# Patient Record
Sex: Male | Born: 1950 | Race: White | Hispanic: No | Marital: Married | Smoking: Current every day smoker
Health system: Southern US, Community
[De-identification: ages and names within clinical notes are randomized; demographics above are authoritative.]

## PROBLEM LIST (undated history)

## (undated) DIAGNOSIS — N400 Enlarged prostate without lower urinary tract symptoms: Secondary | ICD-10-CM

## (undated) DIAGNOSIS — G4733 Obstructive sleep apnea (adult) (pediatric): Secondary | ICD-10-CM

## (undated) DIAGNOSIS — I700 Atherosclerosis of aorta: Secondary | ICD-10-CM

## (undated) DIAGNOSIS — E785 Hyperlipidemia, unspecified: Secondary | ICD-10-CM

## (undated) DIAGNOSIS — K219 Gastro-esophageal reflux disease without esophagitis: Secondary | ICD-10-CM

## (undated) DIAGNOSIS — N281 Cyst of kidney, acquired: Secondary | ICD-10-CM

## (undated) DIAGNOSIS — I2510 Atherosclerotic heart disease of native coronary artery without angina pectoris: Secondary | ICD-10-CM

## (undated) DIAGNOSIS — R7303 Prediabetes: Secondary | ICD-10-CM

## (undated) DIAGNOSIS — K760 Fatty (change of) liver, not elsewhere classified: Secondary | ICD-10-CM

## (undated) DIAGNOSIS — I6529 Occlusion and stenosis of unspecified carotid artery: Secondary | ICD-10-CM

## (undated) DIAGNOSIS — N529 Male erectile dysfunction, unspecified: Secondary | ICD-10-CM

## (undated) DIAGNOSIS — C801 Malignant (primary) neoplasm, unspecified: Secondary | ICD-10-CM

## (undated) DIAGNOSIS — R001 Bradycardia, unspecified: Secondary | ICD-10-CM

## (undated) DIAGNOSIS — R7989 Other specified abnormal findings of blood chemistry: Secondary | ICD-10-CM

## (undated) DIAGNOSIS — I10 Essential (primary) hypertension: Secondary | ICD-10-CM

## (undated) DIAGNOSIS — C4490 Unspecified malignant neoplasm of skin, unspecified: Secondary | ICD-10-CM

## (undated) DIAGNOSIS — Z7902 Long term (current) use of antithrombotics/antiplatelets: Secondary | ICD-10-CM

## (undated) DIAGNOSIS — D696 Thrombocytopenia, unspecified: Secondary | ICD-10-CM

## (undated) DIAGNOSIS — K148 Other diseases of tongue: Secondary | ICD-10-CM

## (undated) DIAGNOSIS — D751 Secondary polycythemia: Secondary | ICD-10-CM

## (undated) DIAGNOSIS — Z9989 Dependence on other enabling machines and devices: Secondary | ICD-10-CM

## (undated) HISTORY — DX: Malignant (primary) neoplasm, unspecified: C80.1

## (undated) HISTORY — DX: Gastro-esophageal reflux disease without esophagitis: K21.9

## (undated) HISTORY — DX: Male erectile dysfunction, unspecified: N52.9

## (undated) HISTORY — DX: Hyperlipidemia, unspecified: E78.5

## (undated) HISTORY — DX: Atherosclerotic heart disease of native coronary artery without angina pectoris: I25.10

## (undated) HISTORY — DX: Dependence on other enabling machines and devices: Z99.89

## (undated) HISTORY — DX: Thrombocytopenia, unspecified: D69.6

## (undated) HISTORY — PX: ESOPHAGOGASTRODUODENOSCOPY: SHX1529

## (undated) HISTORY — DX: Bradycardia, unspecified: R00.1

## (undated) HISTORY — DX: Essential (primary) hypertension: I10

## (undated) HISTORY — PX: PENILE PROSTHESIS IMPLANT: SHX240

## (undated) HISTORY — DX: Other diseases of tongue: K14.8

## (undated) HISTORY — DX: Obstructive sleep apnea (adult) (pediatric): G47.33

---

## 1987-11-13 HISTORY — PX: KNEE DEBRIDEMENT: SHX1894

## 1989-11-12 HISTORY — PX: KNEE ARTHROSCOPY: SUR90

## 1998-11-12 DIAGNOSIS — Z951 Presence of aortocoronary bypass graft: Secondary | ICD-10-CM

## 1998-11-12 HISTORY — PX: CORONARY ARTERY BYPASS GRAFT: SHX141

## 1998-11-12 HISTORY — DX: Presence of aortocoronary bypass graft: Z95.1

## 1999-10-23 ENCOUNTER — Ambulatory Visit (HOSPITAL_COMMUNITY): Admission: RE | Admit: 1999-10-23 | Discharge: 1999-10-23 | Payer: Self-pay | Admitting: *Deleted

## 1999-10-27 ENCOUNTER — Encounter: Payer: Self-pay | Admitting: Thoracic Surgery (Cardiothoracic Vascular Surgery)

## 1999-10-31 ENCOUNTER — Inpatient Hospital Stay (HOSPITAL_COMMUNITY)
Admission: RE | Admit: 1999-10-31 | Discharge: 1999-11-04 | Payer: Self-pay | Admitting: Thoracic Surgery (Cardiothoracic Vascular Surgery)

## 1999-10-31 ENCOUNTER — Encounter: Payer: Self-pay | Admitting: Thoracic Surgery (Cardiothoracic Vascular Surgery)

## 1999-11-01 ENCOUNTER — Encounter: Payer: Self-pay | Admitting: Thoracic Surgery (Cardiothoracic Vascular Surgery)

## 1999-11-02 ENCOUNTER — Encounter: Payer: Self-pay | Admitting: Thoracic Surgery (Cardiothoracic Vascular Surgery)

## 1999-11-03 ENCOUNTER — Encounter: Payer: Self-pay | Admitting: Thoracic Surgery (Cardiothoracic Vascular Surgery)

## 2001-06-14 ENCOUNTER — Emergency Department (HOSPITAL_COMMUNITY): Admission: EM | Admit: 2001-06-14 | Discharge: 2001-06-14 | Payer: Self-pay | Admitting: *Deleted

## 2005-12-25 ENCOUNTER — Ambulatory Visit: Payer: Self-pay | Admitting: Cardiology

## 2005-12-26 ENCOUNTER — Inpatient Hospital Stay (HOSPITAL_COMMUNITY): Admission: EM | Admit: 2005-12-26 | Discharge: 2005-12-28 | Payer: Self-pay | Admitting: Emergency Medicine

## 2007-10-22 ENCOUNTER — Emergency Department: Payer: Self-pay | Admitting: Emergency Medicine

## 2007-10-22 ENCOUNTER — Other Ambulatory Visit: Payer: Self-pay

## 2008-09-17 ENCOUNTER — Ambulatory Visit: Payer: Self-pay | Admitting: Endocrinology

## 2008-11-12 HISTORY — PX: CATARACT EXTRACTION, BILATERAL: SHX1313

## 2008-11-12 HISTORY — PX: SHOULDER SURGERY: SHX246

## 2008-12-10 ENCOUNTER — Ambulatory Visit: Payer: Self-pay | Admitting: Unknown Physician Specialty

## 2008-12-17 ENCOUNTER — Ambulatory Visit: Payer: Self-pay | Admitting: Unknown Physician Specialty

## 2009-11-12 HISTORY — PX: SHOULDER ARTHROSCOPY: SHX128

## 2010-12-12 HISTORY — PX: US ECHOCARDIOGRAPHY: HXRAD669

## 2011-03-02 ENCOUNTER — Other Ambulatory Visit: Payer: Self-pay | Admitting: Cardiovascular Disease

## 2013-04-26 ENCOUNTER — Other Ambulatory Visit: Payer: Self-pay | Admitting: Cardiovascular Disease

## 2013-05-03 ENCOUNTER — Other Ambulatory Visit: Payer: Self-pay | Admitting: Cardiovascular Disease

## 2013-05-05 NOTE — Telephone Encounter (Signed)
Rx was sent to pharmacy electronically. 

## 2013-07-08 ENCOUNTER — Other Ambulatory Visit: Payer: Self-pay | Admitting: Cardiovascular Disease

## 2013-07-10 NOTE — Telephone Encounter (Signed)
Dr. Tresa Endo please address this prescription.

## 2013-08-03 ENCOUNTER — Other Ambulatory Visit: Payer: Self-pay | Admitting: Cardiovascular Disease

## 2013-08-03 NOTE — Telephone Encounter (Signed)
Rx was sent to pharmacy electronically. 

## 2013-09-09 ENCOUNTER — Telehealth: Payer: Self-pay | Admitting: Cardiovascular Disease

## 2013-09-09 MED ORDER — VARENICLINE TARTRATE 1 MG PO TABS: 1.0000 mg | ORAL_TABLET | Freq: Two times a day (BID) | ORAL | Status: DC

## 2013-09-09 NOTE — Telephone Encounter (Signed)
Called patient to inform Chantix had been sent to pharmacy

## 2013-09-09 NOTE — Telephone Encounter (Signed)
Its been over a year-need a new prescription for his Chantix.Please send or call to [facility].

## 2013-11-30 ENCOUNTER — Other Ambulatory Visit: Payer: Self-pay | Admitting: Cardiovascular Disease

## 2013-11-30 NOTE — Telephone Encounter (Signed)
Rx was sent to pharmacy electronically. 

## 2013-12-25 ENCOUNTER — Other Ambulatory Visit: Payer: Self-pay | Admitting: Cardiovascular Disease

## 2014-01-05 ENCOUNTER — Ambulatory Visit: Payer: Self-pay | Admitting: Adult Health

## 2014-01-06 ENCOUNTER — Ambulatory Visit: Payer: Self-pay | Admitting: Adult Health

## 2014-01-06 ENCOUNTER — Ambulatory Visit (INDEPENDENT_AMBULATORY_CARE_PROVIDER_SITE_OTHER): Admitting: Adult Health

## 2014-01-06 ENCOUNTER — Encounter (INDEPENDENT_AMBULATORY_CARE_PROVIDER_SITE_OTHER): Payer: Self-pay

## 2014-01-06 ENCOUNTER — Encounter: Payer: Self-pay | Admitting: Adult Health

## 2014-01-06 VITALS — BP 150/72 | HR 57 | Temp 98.1°F | Resp 14 | Ht 71.25 in | Wt 229.0 lb

## 2014-01-06 DIAGNOSIS — Z0000 Encounter for general adult medical examination without abnormal findings: Secondary | ICD-10-CM

## 2014-01-06 DIAGNOSIS — E785 Hyperlipidemia, unspecified: Secondary | ICD-10-CM

## 2014-01-06 DIAGNOSIS — Z1211 Encounter for screening for malignant neoplasm of colon: Secondary | ICD-10-CM

## 2014-01-06 DIAGNOSIS — F17200 Nicotine dependence, unspecified, uncomplicated: Secondary | ICD-10-CM

## 2014-01-06 DIAGNOSIS — I10 Essential (primary) hypertension: Secondary | ICD-10-CM | POA: Insufficient documentation

## 2014-01-06 DIAGNOSIS — Z125 Encounter for screening for malignant neoplasm of prostate: Secondary | ICD-10-CM

## 2014-01-06 DIAGNOSIS — N529 Male erectile dysfunction, unspecified: Secondary | ICD-10-CM

## 2014-01-06 DIAGNOSIS — Z7189 Other specified counseling: Secondary | ICD-10-CM

## 2014-01-06 DIAGNOSIS — Z716 Tobacco abuse counseling: Secondary | ICD-10-CM

## 2014-01-06 LAB — LIPID PANEL
Cholesterol: 189 mg/dL (ref 0–200)
HDL: 53.3 mg/dL (ref >39.00)
Total CHOL/HDL Ratio: 4
Triglycerides: 251 mg/dL — ABNORMAL HIGH (ref 0.0–149.0)
VLDL: 50.2 mg/dL — ABNORMAL HIGH (ref 0.0–40.0)

## 2014-01-06 LAB — CBC WITH DIFFERENTIAL/PLATELET
Basophils Absolute: 0 10*3/uL (ref 0.0–0.1)
Basophils Relative: 0.2 % (ref 0.0–3.0)
Eosinophils Absolute: 0.1 10*3/uL (ref 0.0–0.7)
Eosinophils Relative: 1.2 % (ref 0.0–5.0)
HCT: 44.6 % (ref 39.0–52.0)
Hemoglobin: 14.8 g/dL (ref 13.0–17.0)
Lymphocytes Relative: 38.6 % (ref 12.0–46.0)
Lymphs Abs: 3.3 10*3/uL (ref 0.7–4.0)
MCHC: 33.1 g/dL (ref 30.0–36.0)
MCV: 95.1 fl (ref 78.0–100.0)
Monocytes Absolute: 0.6 10*3/uL (ref 0.1–1.0)
Monocytes Relative: 7.4 % (ref 3.0–12.0)
Neutro Abs: 4.5 10*3/uL (ref 1.4–7.7)
Neutrophils Relative %: 52.6 % (ref 43.0–77.0)
Platelets: 120 10*3/uL — ABNORMAL LOW (ref 150.0–400.0)
RBC: 4.69 Mil/uL (ref 4.22–5.81)
RDW: 13.8 % (ref 11.5–14.6)
WBC: 8.5 10*3/uL (ref 4.5–10.5)

## 2014-01-06 LAB — LDL CHOLESTEROL, DIRECT: Direct LDL: 91.6 mg/dL

## 2014-01-06 LAB — COMPREHENSIVE METABOLIC PANEL
ALT: 31 U/L (ref 0–53)
AST: 23 U/L (ref 0–37)
Albumin: 4.3 g/dL (ref 3.5–5.2)
Alkaline Phosphatase: 83 U/L (ref 39–117)
BUN: 13 mg/dL (ref 6–23)
CO2: 28 mEq/L (ref 19–32)
Calcium: 9.4 mg/dL (ref 8.4–10.5)
Chloride: 102 mEq/L (ref 96–112)
Creatinine, Ser: 1 mg/dL (ref 0.4–1.5)
GFR: 81.87 mL/min (ref >60.00)
Glucose, Bld: 86 mg/dL (ref 70–99)
Potassium: 4.5 mEq/L (ref 3.5–5.1)
Sodium: 138 mEq/L (ref 135–145)
Total Bilirubin: 1 mg/dL (ref 0.3–1.2)
Total Protein: 7.2 g/dL (ref 6.0–8.3)

## 2014-01-06 LAB — TSH: TSH: 1.11 u[IU]/mL (ref 0.35–5.50)

## 2014-01-06 LAB — PSA: PSA: 0.69 ng/mL (ref 0.10–4.00)

## 2014-01-06 MED ORDER — SILDENAFIL CITRATE 100 MG PO TABS: 100.0000 mg | ORAL_TABLET | ORAL | Status: DC | PRN

## 2014-01-06 NOTE — Patient Instructions (Addendum)
   Thank you for choosing [place] [facility] for your health care needs.  Please have your labs drawn prior to leaving the office.  The results will be available through MyChart for your convenience. Please remember to activate this. The activation code is located at the end of this form. If you have not activated this we will call you with the results.  I am referring you to GI for your screening colonoscopy.  I will request records from your previous provided.

## 2014-01-06 NOTE — Progress Notes (Signed)
Patient ID: Christian Cole, male   DOB: Apr 09, 1951, 63 y.o.   MRN: [id number]    Subjective:    Patient ID: Christian Cole, male    DOB: 15-Apr-1951, 63 y.o.   MRN: [id number]  HPI  Christian Cole is a pleasant 63 y/o male who presents to clinic to establish care. He was previously followed by Dr. Sheryle Spray. Last saw his previous provider ~ 4 years ago. He is followed by cardiology, Dr. Claiborne Billings. He is feeling well overall. Reports not exercising as he should. He is currently taking Chantix to quit smoking and has been doing well. Compliance with medication. History reviewed with patient.     Past Medical History  Diagnosis Date  . GERD (gastroesophageal reflux disease)   . Hypertension     Followed by Dr. Claiborne Billings  . Hyperlipidemia   . CAD (coronary artery disease)     s/p CABG x 4  . ED (erectile dysfunction)      Past Surgical History  Procedure Laterality Date  . Shoulder surgery  2010    bone spur   . Knee debridement  1989    Fluid flush  . Coronary artery bypass graft  2000  . Cataract extraction, bilateral  2010     Family History  Problem Relation Age of Onset  . Arthritis Mother   . Heart disease Father     CAD - MI  . Stroke Father   . Hypertension Father   . Cancer Father 45    esophageal cancer   . Arthritis Maternal Grandmother      History   Social History  . Marital Status: Married    Spouse Name: N/A    Number of Children: 3  . Years of Education: 16   Occupational History  . Retired Corporate treasurer   . Management/Technical [place]   Social History Main Topics  . Smoking status: Light Tobacco Smoker  . Smokeless tobacco: Not on file     Comment: currently quiting, using chantix  . Alcohol Use: 14.0 oz/week    28 drink(s) per week     Comment: approx 28 beers a week (4 per day)  . Drug Use: No  . Sexual Activity: Not on file   Other Topics Concern  . Not on file   Social History Narrative   Christian Cole grew up in [place]. He is  currently living in [place] with his wife. This is his second marriage. He has 1 daughter and 2 sons from his first marriage. His daughter lives in [place], one son in [place], [place] and the other son in [place], [place]. 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 He enjoys wood working on his spare time. 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      Current Outpatient Prescriptions on File Prior to Visit  Medication Sig Dispense Refill  . benazepril (LOTENSIN) 20 MG tablet Take 1 tablet (20 mg total) by mouth daily. Keep appointment for refills.  90 tablet  0  . clopidogrel (PLAVIX) 75 MG tablet TAKE 1 TABLET DAILY  90 tablet  3  . metoprolol tartrate (LOPRESSOR) 25 MG tablet TAKE 1 TABLET IN THE MORNING AND TAKE ONE-HALF (1/2) TABLET IN THE EVENING  135 tablet  0  . varenicline (CHANTIX CONTINUING MONTH PAK)  1 MG tablet Take 1 tablet (1 mg total) by mouth 2 (two) times daily.  180 tablet  6  . VIAGRA 100 MG tablet TAKE 1/2 TO 1 TABLET AS NEEDED FOR SEXUAL ACTIVITY  20 tablet  0   No current facility-administered medications on file prior to visit.     Review of Systems  Constitutional: Negative.   HENT: Negative.   Eyes: Negative.   Respiratory: Negative.   Cardiovascular: Negative.   Gastrointestinal: Negative.   Endocrine: Negative.   Genitourinary: Negative.   Musculoskeletal: Negative.   Skin: Negative.   Allergic/Immunologic: Negative.   Neurological: Negative.   Hematological: Negative.   Psychiatric/Behavioral: Negative.        Objective:  BP 150/72  Pulse 57  Temp(Src) 98.1 F (36.7 C) (Oral)  Resp 14  Ht 5' 11.25" (1.81 m)  Wt 229 lb (103.874 kg)  BMI 31.71 kg/m2  SpO2 96%   Physical Exam  Constitutional: He is oriented to person, place, and time. He appears  well-developed and well-nourished. No distress.  HENT:  Head: Normocephalic and atraumatic.  Right Ear: External ear normal.  Left Ear: External ear normal.  Nose: Nose normal.  Mouth/Throat: Oropharynx is clear and moist.  Eyes: Conjunctivae and EOM are normal. Pupils are equal, round, and reactive to light.  Neck: Normal range of motion. Neck supple. No tracheal deviation present. No thyromegaly present.  Cardiovascular: Normal rate, regular rhythm, normal heart sounds and intact distal pulses.  Exam reveals no gallop and no friction rub.   No murmur heard. Pulmonary/Chest: Effort normal and breath sounds normal. No respiratory distress. He has no wheezes. He has no rales.  Abdominal: Soft. Bowel sounds are normal. He exhibits no distension and no mass. There is no tenderness. There is no rebound and no guarding.  Musculoskeletal: Normal range of motion. He exhibits no edema and no tenderness.  Lymphadenopathy:    He has no cervical adenopathy.  Neurological: He is alert and oriented to person, place, and time. He has normal reflexes. No cranial nerve deficit. Coordination normal.  Skin: Skin is warm and dry.  Psychiatric: He has a normal mood and affect. His behavior is normal. Judgment and thought content normal.       Assessment & Plan:   1. Routine general medical examination at a health care facility Normal physical exam. Reviewed screenings as listed below. Labs checked.  - TSH - Vit D  25 hydroxy (rtn osteoporosis monitoring)  2. Screening for colon cancer Refer to GI for screening colonoscopy. He has never had this done. - Ambulatory referral to Gastroenterology  3. Screening for prostate cancer Check PSA. Not experiencing any problems with urine flow or other urinary symptoms - PSA  4. HLD (hyperlipidemia) Pt is followed by Dr. Claiborne Billings, Cardiology. Upcoming appointment next week. Pt requested that labs be drawn so that they would be available at visit. - Lipid  panel  5. HTN (hypertension) Followed by Dr. Claiborne Billings - Appointment next week. Will check labs. - Comprehensive metabolic panel - CBC with Differential  6. ED (erectile dysfunction) Hx of ED on viagra. No s/s syncope, low blood pressure. Refill provided.  7. Tobacco abuse counseling Pt is currently taking Chantix to quit smoking. Reports smoking minimally and anticipate being able to fully quit within the next few weeks. Encouraged to continue to push towards quitting as this will improve his overall health.

## 2014-01-06 NOTE — Progress Notes (Signed)
Pre visit review using our clinic review tool, if applicable. No additional management support is needed unless otherwise documented below in the visit note. 

## 2014-01-07 LAB — VITAMIN D 25 HYDROXY (VIT D DEFICIENCY, FRACTURES): Vit D, 25-Hydroxy: 30 ng/mL (ref 30–89)

## 2014-01-08 ENCOUNTER — Encounter: Payer: Self-pay | Admitting: Adult Health

## 2014-01-08 ENCOUNTER — Other Ambulatory Visit: Payer: Self-pay | Admitting: Adult Health

## 2014-01-08 ENCOUNTER — Other Ambulatory Visit: Payer: Self-pay | Admitting: Cardiovascular Disease

## 2014-01-08 ENCOUNTER — Telehealth: Payer: Self-pay | Admitting: Adult Health

## 2014-01-08 DIAGNOSIS — D696 Thrombocytopenia, unspecified: Secondary | ICD-10-CM

## 2014-01-08 DIAGNOSIS — Z716 Tobacco abuse counseling: Secondary | ICD-10-CM | POA: Insufficient documentation

## 2014-01-08 NOTE — Telephone Encounter (Signed)
Relevant patient education assigned to patient using Emmi. ° °

## 2014-01-08 NOTE — Telephone Encounter (Signed)
Rx was sent to pharmacy electronically. 

## 2014-01-18 ENCOUNTER — Encounter: Payer: Self-pay | Admitting: Cardiovascular Disease

## 2014-01-18 ENCOUNTER — Ambulatory Visit (INDEPENDENT_AMBULATORY_CARE_PROVIDER_SITE_OTHER): Admitting: Cardiovascular Disease

## 2014-01-18 VITALS — BP 140/80 | HR 45 | Ht 71.0 in | Wt 230.2 lb

## 2014-01-18 DIAGNOSIS — Z7189 Other specified counseling: Secondary | ICD-10-CM

## 2014-01-18 DIAGNOSIS — I2510 Atherosclerotic heart disease of native coronary artery without angina pectoris: Secondary | ICD-10-CM

## 2014-01-18 DIAGNOSIS — F17200 Nicotine dependence, unspecified, uncomplicated: Secondary | ICD-10-CM

## 2014-01-18 DIAGNOSIS — Z716 Tobacco abuse counseling: Secondary | ICD-10-CM

## 2014-01-18 DIAGNOSIS — I10 Essential (primary) hypertension: Secondary | ICD-10-CM

## 2014-01-18 DIAGNOSIS — E785 Hyperlipidemia, unspecified: Secondary | ICD-10-CM

## 2014-01-18 NOTE — Patient Instructions (Signed)
Your physician has recommended you make the following change in your medication: start some fish oil daily over the counter daily.  Your physician recommends that you schedule a follow-up appointment and lexiscan myoview in 1 year.

## 2014-01-26 ENCOUNTER — Other Ambulatory Visit (INDEPENDENT_AMBULATORY_CARE_PROVIDER_SITE_OTHER)

## 2014-01-26 ENCOUNTER — Encounter: Payer: Self-pay | Admitting: Adult Health

## 2014-01-26 DIAGNOSIS — D696 Thrombocytopenia, unspecified: Secondary | ICD-10-CM

## 2014-01-26 LAB — CBC WITH DIFFERENTIAL/PLATELET
Basophils Absolute: 0 10*3/uL (ref 0.0–0.1)
Basophils Relative: 0.6 % (ref 0.0–3.0)
Eosinophils Absolute: 0.1 10*3/uL (ref 0.0–0.7)
Eosinophils Relative: 1.2 % (ref 0.0–5.0)
HCT: 39.6 % (ref 39.0–52.0)
Hemoglobin: 13 g/dL (ref 13.0–17.0)
Lymphocytes Relative: 44.8 % (ref 12.0–46.0)
Lymphs Abs: 3.5 10*3/uL (ref 0.7–4.0)
MCHC: 32.9 g/dL (ref 30.0–36.0)
MCV: 94.8 fl (ref 78.0–100.0)
Monocytes Absolute: 0.7 10*3/uL (ref 0.1–1.0)
Monocytes Relative: 9.1 % (ref 3.0–12.0)
Neutro Abs: 3.5 10*3/uL (ref 1.4–7.7)
Neutrophils Relative %: 44.3 % (ref 43.0–77.0)
Platelets: 115 10*3/uL — ABNORMAL LOW (ref 150.0–400.0)
RBC: 4.18 Mil/uL — ABNORMAL LOW (ref 4.22–5.81)
RDW: 14.3 % (ref 11.5–14.6)
WBC: 7.8 10*3/uL (ref 4.5–10.5)

## 2014-02-04 ENCOUNTER — Encounter: Payer: Self-pay | Admitting: Cardiovascular Disease

## 2014-02-04 ENCOUNTER — Telehealth: Payer: Self-pay | Admitting: *Deleted

## 2014-02-04 DIAGNOSIS — I2510 Atherosclerotic heart disease of native coronary artery without angina pectoris: Secondary | ICD-10-CM | POA: Insufficient documentation

## 2014-02-04 NOTE — Telephone Encounter (Signed)
Faxed Colonoscopy/EGD clearance to [facility].

## 2014-02-04 NOTE — Progress Notes (Signed)
Patient ID: Christian Cole, male   DOB: 25-May-1951, 63 y.o.   MRN: [id number]     HPI: Christian Cole is a 63 y.o. male the presents for two year cardiology evaluation. I last saw him in April 2013.  Christian Cole has known coronary artery disease and underwent initial percutaneous coronary intervention in 1998 of an unknown vessel. He underwent CABG revascularization surgery in December 2000 with LIMA to LAD, LIMA to the PDA, vein to the diagonal, and then to the PLA. 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  His last echo Doppler study in January 2002 showed normal systolic function. His mild TR and mild Christian. A nuclear perfusion study in March 2013 showed normal perfusion without scar or ischemia.  He does have a history of tobacco use. He currently is on Chantix. He had quit smoking but unfortunately did resume. He has not been very active. He may ultimately require GI colonoscopy and endoscopy.  Past Medical History  Diagnosis Date  . GERD (gastroesophageal reflux disease)   . Hypertension     Followed by Dr. Claiborne Billings  . Hyperlipidemia   . CAD (coronary artery disease)     s/p CABG x 4  . ED (erectile dysfunction)     Past Surgical History  Procedure Laterality Date  . Shoulder surgery  2010    bone spur   . Knee debridement  1989    Fluid flush  . Coronary artery bypass graft  2000  . Cataract extraction, bilateral  2010    No Known Allergies  Current Outpatient Prescriptions  Medication Sig Dispense Refill  . atorvastatin (LIPITOR) 40 MG tablet TAKE 1 TABLET AT BEDTIME ( THIS WILL REPLACE VYTORIN )  90 tablet  0  . benazepril (LOTENSIN) 20 MG tablet Take 1 tablet (20 mg total) by mouth daily. Keep appointment for refills.  90 tablet  0  . clopidogrel (PLAVIX) 75 MG tablet TAKE 1 TABLET DAILY  90 tablet  3  . metoprolol tartrate  (LOPRESSOR) 25 MG tablet TAKE 1 TABLET IN THE MORNING AND TAKE ONE-HALF (1/2) TABLET IN THE EVENING  135 tablet  0  . ranitidine (ZANTAC) 150 MG tablet Take 150 mg by mouth once.      . sildenafil (VIAGRA) 100 MG tablet Take 1 tablet (100 mg total) by mouth as needed for erectile dysfunction.  30 tablet  3  . varenicline (CHANTIX CONTINUING MONTH PAK) 1 MG tablet Take 1 tablet (1 mg total) by mouth 2 (two) times daily.  180 tablet  6   No current facility-administered medications for this visit.    History   Social History  . Marital Status: Married    Spouse Name: N/A    Number of Children: 3  . Years of Education: 16   Occupational History  . Retired Corporate treasurer   . Management/Technical [place]   Social History Main Topics  . Smoking status: Light Tobacco Smoker  . Smokeless tobacco: Not on file     Comment: currently quiting, using chantix  . Alcohol Use: 14.0 oz/week    28 drink(s) per week     Comment: approx 28 beers a week (4 per day)  . Drug Use: No  . Sexual Activity: Not on file   Other Topics Concern  . Not on file   Social History Narrative   Christian Cole grew up in [place].  He is currently living in [place] with his wife. This is his second marriage. He has 1 daughter and 2 sons from his first marriage. His daughter lives in [place], one son in [place], [place] and the other son in [place], [place]. 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 He enjoys wood working on his spare time. 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     Family History  Problem Relation Age of Onset  . Arthritis Mother   . Heart disease Father     CAD - MI  . Stroke Father   . Hypertension Father   . Cancer Father 26    esophageal cancer   . Arthritis Maternal Grandmother     ROS  is negative for fevers, chills or night sweats.  He denies change in vision or hearing. He is unaware of lymphadenopathy. He denies significant weight change. There is some mild shortness of breath. He denies chest pressure. He denies presyncope or syncope. He denies nausea vomiting or diarrhea. He does have erectile dysfunction. There is no claudication. He does have a history of hypertension. He denies myalgias. He uses his CPAP therapy. He is unaware of Coumadin intolerance. There is no diabetes. Other comprehensive 14 point system review is negative.  PE BP 140/80  Pulse 45  Ht 5\' 11"  (1.803 m)  Wt 230 lb 3.2 oz (104.418 kg)  BMI 32.12 kg/m2  General: Alert, oriented, no distress.  Skin: normal turgor, no rashes HEENT: Normocephalic, atraumatic. Pupils round and reactive; sclera anicteric;no lid lag. Extraocular muscles intact;; no xanthelasmas. Nose without nasal septal hypertrophy Mouth/Parynx benign; Mallinpatti scale 3 Neck: No JVD, no carotid bruits; normal carotid upstroke Lungs: clear to ausculatation and percussion; no wheezing or rales Chest wall: no tenderness to palpitation Heart: RRR, s1 s2 normal; 1/6 systolic murmur. no diastolic murmur, rub thrills or heaves Abdomen: Mild central adiposity soft, nontender; no hepatosplenomehaly, BS+; abdominal aorta nontender and not dilated by palpation. Back: no CVA tenderness Pulses 2+ Extremities: no clubbing cyanosis or edema, Homan's sign negative  Neurologic: grossly nonfocal; cranial nerves grossly normal. Psychologic: normal affect and mood.  ECG (independently read by me): Sinus bradycardia at 45 beats per minute. Normal intervals. Q waves in lead 3 and small Q wave in aVF.  LABS:  BMET    Component Value Date/Time   NA 138 01/06/2014 1443   K 4.5 01/06/2014 1443   CL 102 01/06/2014 1443   CO2 28 01/06/2014 1443   GLUCOSE 86 01/06/2014 1443   BUN 13 01/06/2014 1443   CREATININE 1.0 01/06/2014 1443   CALCIUM 9.4 01/06/2014  1443     Hepatic Function Panel     Component Value Date/Time   PROT 7.2 01/06/2014 1443   ALBUMIN 4.3 01/06/2014 1443   AST 23 01/06/2014 1443   ALT 31 01/06/2014 1443   ALKPHOS 83 01/06/2014 1443   BILITOT 1.0 01/06/2014 1443     CBC    Component Value Date/Time   WBC 7.8 01/26/2014 0913   RBC 4.18* 01/26/2014 0913   HGB 13.0 01/26/2014 0913   HCT 39.6 01/26/2014 0913   PLT 115.0 Repeated and verified X2.* 01/26/2014 0913   MCV 94.8 01/26/2014 0913   MCHC 32.9 01/26/2014 0913   RDW 14.3 01/26/2014 0913   LYMPHSABS 3.5 01/26/2014  0913   MONOABS 0.7 01/26/2014 0913   EOSABS 0.1 01/26/2014 0913   BASOSABS 0.0 01/26/2014 0913     BNP No results found for this basename: probnp    Lipid Panel     Component Value Date/Time   CHOL 189 01/06/2014 1443   TRIG 251.0* 01/06/2014 1443   HDL 53.30 01/06/2014 1443   CHOLHDL 4 01/06/2014 1443   VLDL 50.2* 01/06/2014 1443     RADIOLOGY: No results found.    ASSESSMENT AND PLAN: Christian Cole is a 63 year old gentleman: Initial intervention in 1998 and progressive CAD he underwent CABG surgery 15 years ago. His last nuclear perfusion study in 2013 remained normal without scar or ischemia. We discussed the importance of absolute smoking cessation. I did review recent laboratory. Fasting glucose was 86. Renal function was normal creatinine of 1.0. Vitamin D level was 30. Lipids were notable for increased triglycerides at 251 and increased VLDL and 50. Total cholesterol 189 HDL cholesterol 53. I have recommended he add fish oil 2 capsules daily to his medical regimen and discussed significant diet adjustment with reduction in carbohydrates and sweets. His blood pressure today is controlled on current therapy. In one year he'll undergo a three-year followup  Chalfant study to followup on his CABG surgery which was done in the year 2000. If he does need colonoscopy and endoscopy he was given clearance to undergo this procedure.    Troy Sine, MD, [facility]  02/04/2014 5:36 PM

## 2014-02-15 ENCOUNTER — Telehealth: Payer: Self-pay | Admitting: Adult Health

## 2014-02-15 NOTE — Telephone Encounter (Signed)
See below my chart message  Appointment Request From: Dutch Gray  ith Provider: Charolette Forward, NP [-Primary Care Physician-]  Preferred Date Range: From 02/15/2014 To 02/16/2014  Preferred Times: Any  Reason: To address the following health maintenance concerns.  Colonoscopy  Comments: Procedure is scheduled for tomorrow February 16, 2014.

## 2014-02-15 NOTE — Telephone Encounter (Signed)
Pt states that he was just letting us know that his colonoscopy is tomorrow

## 2014-02-16 ENCOUNTER — Ambulatory Visit: Payer: Self-pay | Admitting: Gastroenterology

## 2014-02-16 LAB — HM COLONOSCOPY

## 2014-02-17 ENCOUNTER — Telehealth: Payer: Self-pay | Admitting: *Deleted

## 2014-02-17 NOTE — Telephone Encounter (Signed)
Returned CPAP supply order. 

## 2014-02-19 LAB — PATHOLOGY REPORT

## 2014-02-24 ENCOUNTER — Encounter: Payer: Self-pay | Admitting: Adult Health

## 2014-03-07 ENCOUNTER — Encounter: Payer: Self-pay | Admitting: Adult Health

## 2014-03-07 ENCOUNTER — Other Ambulatory Visit: Payer: Self-pay | Admitting: Cardiovascular Disease

## 2014-03-08 NOTE — Telephone Encounter (Signed)
Rx was sent to pharmacy electronically. 

## 2014-03-23 ENCOUNTER — Other Ambulatory Visit: Payer: Self-pay | Admitting: Cardiovascular Disease

## 2014-03-23 NOTE — Telephone Encounter (Signed)
Rx refill sent to patient pharmacy   

## 2014-04-07 ENCOUNTER — Other Ambulatory Visit: Payer: Self-pay | Admitting: Cardiovascular Disease

## 2014-04-07 NOTE — Telephone Encounter (Signed)
Rx was sent to pharmacy electronically. 

## 2014-05-10 ENCOUNTER — Ambulatory Visit: Admitting: Adult Health

## 2014-05-10 DIAGNOSIS — Z0289 Encounter for other administrative examinations: Secondary | ICD-10-CM

## 2014-08-16 ENCOUNTER — Telehealth: Payer: Self-pay | Admitting: Cardiovascular Disease

## 2014-08-16 MED ORDER — ATORVASTATIN CALCIUM 40 MG PO TABS: 40.0000 mg | ORAL_TABLET | Freq: Every day | ORAL | Status: DC

## 2014-08-16 NOTE — Telephone Encounter (Signed)
Rx was sent to pharmacy electronically. 

## 2014-08-16 NOTE — Telephone Encounter (Signed)
Mr Offerdahl is calling because [facility] is asking for a new prescription for his Atorvastatin 40mg   Please call if you have any questions    Thanks

## 2014-10-28 ENCOUNTER — Encounter: Payer: Self-pay | Admitting: Nurse Practitioner

## 2014-10-28 ENCOUNTER — Ambulatory Visit (INDEPENDENT_AMBULATORY_CARE_PROVIDER_SITE_OTHER): Admitting: Nurse Practitioner

## 2014-10-28 VITALS — BP 164/76 | HR 57 | Resp 14 | Ht 71.0 in | Wt 235.0 lb

## 2014-10-28 DIAGNOSIS — Z7901 Long term (current) use of anticoagulants: Secondary | ICD-10-CM

## 2014-10-28 DIAGNOSIS — E785 Hyperlipidemia, unspecified: Secondary | ICD-10-CM

## 2014-10-28 DIAGNOSIS — Z716 Tobacco abuse counseling: Secondary | ICD-10-CM

## 2014-10-28 DIAGNOSIS — I10 Essential (primary) hypertension: Secondary | ICD-10-CM

## 2014-10-28 NOTE — Progress Notes (Signed)
Pre visit review using our clinic review tool, if applicable. No additional management support is needed unless otherwise documented below in the visit note. 

## 2014-10-28 NOTE — Progress Notes (Signed)
Subjective:    Patient ID: Christian Cole, male    DOB: May 09, 1951, 63 y.o.   MRN: [id number]  HPI  Christian Cole is a 63 yo male here for a FU of HTN and HLD.  1) HTN-  March sees cardio, needs labs before leaving today for that appointment. Not eaten today and only had black coffee.  BP has been up at home. 180/70's (top number elevated usually he states). 140's/70's after relaxing.  Nervous when taking BP   Wt Readings from Last 3 Encounters:  10/28/14 235 lb (106.595 kg)  01/18/14 230 lb 3.2 oz (104.418 kg)  01/06/14 229 lb (103.874 kg)   2) Stopped drinking beer (up to 6 a day previously).  Has not eaten more than usual, coffee and water Exercise- None  Patient is motivated to lose weight    Review of Systems  Constitutional: Negative for fever, chills, diaphoresis, fatigue and unexpected weight change.  HENT: Positive for tinnitus. Negative for trouble swallowing.        Bilateral ears, "light ringing".   Eyes: Negative for visual disturbance.  Respiratory: Negative for chest tightness, shortness of breath and wheezing.   Gastrointestinal: Negative for nausea, vomiting, diarrhea and blood in stool.  Genitourinary: Negative for decreased urine volume.  Musculoskeletal: Negative for back pain and neck pain.  Skin: Negative for rash and wound.  Neurological: Negative for dizziness and headaches.  Hematological: Bruises/bleeds easily.       Plt keep check with Plavix and Chantix   Psychiatric/Behavioral: Negative for suicidal ideas.       Denies problems with anxiety/depression.    Past Medical History  Diagnosis Date  . GERD (gastroesophageal reflux disease)   . Hypertension     Followed by Dr. Claiborne Billings  . Hyperlipidemia   . CAD (coronary artery disease)     s/p CABG x 4  . ED (erectile dysfunction)     History   Social History  . Marital Status: Married    Spouse Name: N/A    Number of Children: 3  . Years of Education: 16   Occupational History  .  Retired Corporate treasurer   . Management/Technical [place]   Social History Main Topics  . Smoking status: Light Tobacco Smoker    Types: Cigarettes  . Smokeless tobacco: Not on file     Comment: currently quiting, using chantix  . Alcohol Use: 1.2 oz/week    2 Cans of beer per week     Comment: Down to 2 beers a day from 4-6 a day   . Drug Use: No  . Sexual Activity:    Partners: Female     Comment: Wife   Other Topics Concern  . Not on file   Social History Narrative   Christian Cole grew up in [place]. He is currently living in [place] with his wife. This is his second marriage. He has 1 daughter and 2 sons from his first marriage. His daughter lives in [place], one son in [place], [place] and the other son in [place], [place]. 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 He enjoys wood working on his spare time. 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     Past Surgical History  Procedure Laterality Date  . Shoulder surgery  2010  bone spur   . Knee debridement  1989    Fluid flush  . Coronary artery bypass graft  2000  . Cataract extraction, bilateral  2010    Family History  Problem Relation Age of Onset  . Arthritis Mother   . Heart disease Father     CAD - MI  . Stroke Father   . Hypertension Father   . Cancer Father 58    esophageal cancer   . Arthritis Maternal Grandmother     No Known Allergies  Current Outpatient Prescriptions on File Prior to Visit  Medication Sig Dispense Refill  . atorvastatin (LIPITOR) 40 MG tablet Take 1 tablet (40 mg total) by mouth daily. 90 tablet 1  . benazepril (LOTENSIN) 20 MG tablet TAKE 1 TABLET DAILY (KEEP APPOINTMENT FOR REFILLS) 90 tablet 3  . clopidogrel (PLAVIX) 75 MG tablet TAKE 1 TABLET DAILY 90 tablet 2  . metoprolol tartrate  (LOPRESSOR) 25 MG tablet TAKE 1 TABLET IN THE MORNING AND ONE-HALF (1/2) TABLET IN THE EVENING 135 tablet 3  . sildenafil (VIAGRA) 100 MG tablet Take 1 tablet (100 mg total) by mouth as needed for erectile dysfunction. 30 tablet 3  . varenicline (CHANTIX CONTINUING MONTH PAK) 1 MG tablet Take 1 tablet (1 mg total) by mouth 2 (two) times daily. 180 tablet 6   No current facility-administered medications on file prior to visit.          Objective:   Physical Exam  Constitutional: He is oriented to person, place, and time. He appears well-developed and well-nourished. No distress.  HENT:  Head: Normocephalic and atraumatic.  Mouth/Throat: No oropharyngeal exudate.  Cardiovascular: Normal rate and regular rhythm.   Pulmonary/Chest: Effort normal and breath sounds normal.  Neurological: He is alert and oriented to person, place, and time.  Skin: Skin is warm and dry. No rash noted. He is not diaphoretic.  Psychiatric: He has a normal mood and affect. His behavior is normal. Judgment and thought content normal.      BP 164/76 mmHg  Pulse 57  Resp 14  Ht 5\' 11"  (1.803 m)  Wt 235 lb (106.595 kg)  BMI 32.79 kg/m2  SpO2 97%  148/82 repeat     Assessment & Plan:

## 2014-10-28 NOTE — Assessment & Plan Note (Signed)
No specific diet/exercise. Pt cut down beer intake and is gaining some weight. He is on Chantix to stop smoking and is down to 2 cigarettes per day. Obtain fasting Lipid panel today. Pt on Atorvastatin 40 mg daily.

## 2014-10-28 NOTE — Assessment & Plan Note (Addendum)
Sees Cardio in March, will discuss HTN meds at that visit. His BP is mildly to moderately elevated depending on stress level during checking. Obtain CMET, CBC w/ diff, and fasting Lipid panel today. FU in March after Cardio.

## 2014-10-28 NOTE — Assessment & Plan Note (Signed)
Currently down to 2 cig/day. He is motivated to stop and throw out the cigarettes.

## 2014-10-28 NOTE — Patient Instructions (Signed)
Nice to meet you today.   We will send your lab results to your MyChart when they come back.   Please visit lab before leaving today.  Happy Holidays!

## 2014-10-29 ENCOUNTER — Telehealth: Payer: Self-pay | Admitting: Nurse Practitioner

## 2014-10-29 LAB — LIPID PANEL
Cholesterol: 166 mg/dL (ref 0–200)
HDL: 32.5 mg/dL — ABNORMAL LOW (ref >39.00)
LDL Cholesterol: 104 mg/dL — ABNORMAL HIGH (ref 0–99)
NonHDL: 133.5
Total CHOL/HDL Ratio: 5
Triglycerides: 149 mg/dL (ref 0.0–149.0)
VLDL: 29.8 mg/dL (ref 0.0–40.0)

## 2014-10-29 LAB — COMPREHENSIVE METABOLIC PANEL
ALT: 31 U/L (ref 0–53)
AST: 27 U/L (ref 0–37)
Albumin: 4.5 g/dL (ref 3.5–5.2)
Alkaline Phosphatase: 96 U/L (ref 39–117)
BUN: 15 mg/dL (ref 6–23)
CO2: 27 mEq/L (ref 19–32)
Calcium: 9.2 mg/dL (ref 8.4–10.5)
Chloride: 104 mEq/L (ref 96–112)
Creatinine, Ser: 1.1 mg/dL (ref 0.4–1.5)
GFR: 72.22 mL/min (ref >60.00)
Glucose, Bld: 92 mg/dL (ref 70–99)
Potassium: 4.6 mEq/L (ref 3.5–5.1)
Sodium: 139 mEq/L (ref 135–145)
Total Bilirubin: 1.1 mg/dL (ref 0.2–1.2)
Total Protein: 7 g/dL (ref 6.0–8.3)

## 2014-10-29 LAB — CBC WITH DIFFERENTIAL/PLATELET
Basophils Absolute: 0 10*3/uL (ref 0.0–0.1)
Basophils Relative: 0.5 % (ref 0.0–3.0)
Eosinophils Absolute: 0.1 10*3/uL (ref 0.0–0.7)
Eosinophils Relative: 1.6 % (ref 0.0–5.0)
HCT: 43 % (ref 39.0–52.0)
Hemoglobin: 14.2 g/dL (ref 13.0–17.0)
Lymphocytes Relative: 43.5 % (ref 12.0–46.0)
Lymphs Abs: 3.5 10*3/uL (ref 0.7–4.0)
MCHC: 33.1 g/dL (ref 30.0–36.0)
MCV: 93.1 fl (ref 78.0–100.0)
Monocytes Absolute: 0.8 10*3/uL (ref 0.1–1.0)
Monocytes Relative: 9.5 % (ref 3.0–12.0)
Neutro Abs: 3.6 10*3/uL (ref 1.4–7.7)
Neutrophils Relative %: 44.9 % (ref 43.0–77.0)
Platelets: 131 10*3/uL — ABNORMAL LOW (ref 150.0–400.0)
RBC: 4.61 Mil/uL (ref 4.22–5.81)
RDW: 13 % (ref 11.5–15.5)
WBC: 8.1 10*3/uL (ref 4.0–10.5)

## 2014-10-29 NOTE — Telephone Encounter (Signed)
emmi mailed  °

## 2014-11-11 ENCOUNTER — Other Ambulatory Visit: Payer: Self-pay | Admitting: Cardiovascular Disease

## 2015-01-05 ENCOUNTER — Other Ambulatory Visit: Payer: Self-pay | Admitting: Cardiovascular Disease

## 2015-01-05 NOTE — Telephone Encounter (Signed)
Rx(s) sent to pharmacy electronically.  

## 2015-01-17 ENCOUNTER — Encounter: Payer: Self-pay | Admitting: *Deleted

## 2015-01-23 ENCOUNTER — Other Ambulatory Visit: Payer: Self-pay | Admitting: Cardiovascular Disease

## 2015-01-24 NOTE — Telephone Encounter (Signed)
Rx has been sent to the pharmacy electronically. ° °

## 2015-01-31 ENCOUNTER — Encounter: Payer: Self-pay | Admitting: Cardiology

## 2015-01-31 ENCOUNTER — Ambulatory Visit (INDEPENDENT_AMBULATORY_CARE_PROVIDER_SITE_OTHER): Payer: Medicare Other | Admitting: Cardiology

## 2015-01-31 VITALS — BP 146/80 | HR 47 | Ht 71.0 in | Wt 234.6 lb

## 2015-01-31 DIAGNOSIS — I2510 Atherosclerotic heart disease of native coronary artery without angina pectoris: Secondary | ICD-10-CM

## 2015-01-31 DIAGNOSIS — Z951 Presence of aortocoronary bypass graft: Secondary | ICD-10-CM | POA: Diagnosis not present

## 2015-01-31 MED ORDER — ATORVASTATIN CALCIUM 40 MG PO TABS: 40.0000 mg | ORAL_TABLET | Freq: Every day | ORAL | Status: DC

## 2015-01-31 MED ORDER — BENAZEPRIL HCL 40 MG PO TABS: 40.0000 mg | ORAL_TABLET | Freq: Every day | ORAL | Status: DC

## 2015-01-31 MED ORDER — SILDENAFIL CITRATE 100 MG PO TABS
100.0000 mg | ORAL_TABLET | ORAL | Status: DC | PRN
Start: 2015-01-31 — End: 2017-01-15

## 2015-01-31 MED ORDER — METOPROLOL TARTRATE 25 MG PO TABS: 12.5000 mg | ORAL_TABLET | Freq: Two times a day (BID) | ORAL | Status: DC

## 2015-01-31 MED ORDER — CLOPIDOGREL BISULFATE 75 MG PO TABS: 75.0000 mg | ORAL_TABLET | Freq: Every day | ORAL | Status: DC

## 2015-01-31 NOTE — Patient Instructions (Addendum)
DECREASE your Metoprolol to 12.5mg  Twice Daily  INCREASE your Benazepril to 40mg  Daily  Your physician has requested that you have en exercise stress myoview. For further information please visit [email or web address]. Please follow instruction sheet, as given.  Your physician recommends that you schedule a follow-up appointment after your Stress Test with [facility]

## 2015-01-31 NOTE — Progress Notes (Signed)
Cardiology Office Note   Date:  01/31/2015   ID:  Christian Cole, Christian Cole 01-07-1951, MRN [id number]  PCP:  Rubbie Battiest, NP  Cardiologist:  Dr. Claiborne Billings    Chief Complaint  Patient presents with  . Annual Exam    no complaints      History of Present Illness: Christian Cole is a 64 y.o. male who presents for yearly eval of CAD.  He has known coronary artery disease and underwent initial percutaneous coronary intervention in 1998 of an unknown vessel. He underwent CABG revascularization surgery in December 2000 with LIMA to LAD, LIMA to the PDA, vein to the diagonal, and then to the PLA. 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  His last echo Doppler study in January 2002 showed normal systolic function. His mild TR and mild MR. A nuclear perfusion study in March 2013 showed normal perfusion without scar or ischemia.  He does have a history of tobacco use. He currently was on Chantix. He has quit smoking again.  He has not been very active.  We discussed importance of exercise.  Dr. Claiborne Billings wishes to continue exercise myoviews every 3 years with hx CABG.  No chest pain or SOB.  He is back on his fish oil, which he was not taking in Dec.  Lipids followed by PCP.    Past Medical History  Diagnosis Date  . GERD (gastroesophageal reflux disease)   . Hypertension     Followed by Dr. Claiborne Billings  . Hyperlipidemia   . CAD (coronary artery disease)     s/p CABG x 4  . ED (erectile dysfunction)   . OSA on CPAP   . Sinus bradycardia     Past Surgical History  Procedure Laterality Date  . Shoulder surgery  2010    bone spur   . Knee debridement  1989    Fluid flush  . Coronary artery bypass graft  2000    LIMA to the LAD,RIMA to the PDA,vein graft to the diagonal & vein graft to the PLA  . Cataract extraction, bilateral  2010  . US  echocardiography  12/12/2010    mild LA dilatation, normal LV systolic fx, EF > 86%     Current Outpatient Prescriptions  Medication Sig Dispense Refill  . atorvastatin (LIPITOR) 40 MG tablet Take 1 tablet (40 mg total) by mouth daily at 6 PM. Need appointment for future refills. 90 tablet 0  . benazepril (LOTENSIN) 20 MG tablet TAKE 1 TABLET DAILY (KEEP APPOINTMENT FOR REFILLS) 90 tablet 0  . clopidogrel (PLAVIX) 75 MG tablet TAKE 1 TABLET DAILY 90 tablet 1  . metoprolol tartrate (LOPRESSOR) 25 MG tablet TAKE 1 TABLET IN THE MORNING AND ONE-HALF (1/2) TABLET IN THE EVENING 135 tablet 3  . pantoprazole (PROTONIX) 40 MG tablet Take 40 mg by mouth daily.     . sildenafil (VIAGRA) 100 MG tablet Take 1 tablet (100 mg total) by mouth as needed for erectile dysfunction. 30 tablet 3   No current facility-administered medications for this visit.    Allergies:   Review of patient's allergies indicates no known allergies.    Social History:  The patient  reports that he quit smoking about 1 months ago. His smoking use included Cigarettes. He quit after 40 years of use. He does not have any smokeless tobacco history on file. He reports that he drinks about 6.0  oz of alcohol per week. He reports that he does not use illicit drugs.   Family History:  The patient's family history includes Arthritis in his maternal grandmother and mother; Cancer (age of onset: 78) in his father; Heart disease in his father; Hypertension in his father; Stroke in his father.    ROS:  General:no colds or fevers, no weight changes Skin:no rashes or ulcers HEENT:no blurred vision, no congestion CV:see HPI PUL:see HPI GI:no diarrhea constipation or melena, no indigestion- recent EGD and colonoscopy- was placed on protonix  GU:no hematuria, no dysuria MS:no joint pain, no claudication Neuro:no syncope, no lightheadedness despite low HR Endo:no diabetes, no thyroid disease  Wt Readings from Last 3 Encounters:  01/31/15  234 lb 9.6 oz (106.414 kg)  10/28/14 235 lb (106.595 kg)  01/18/14 230 lb 3.2 oz (104.418 kg)     PHYSICAL EXAM: VS:  BP 146/80 mmHg  Pulse 47  Ht 5\' 11"  (1.803 m)  Wt 234 lb 9.6 oz (106.414 kg)  BMI 32.73 kg/m2 , BMI Body mass index is 32.73 kg/(m^2). General:Pleasant affect, NAD Skin:Warm and dry, brisk capillary refill HEENT:normocephalic, sclera clear, mucus membranes moist Neck:supple, no JVD, no bruits  Heart:S1S2 RRR with 1/6 soft systolic murmur, no gallup, rub or click Lungs:clear without rales, rhonchi, or wheezes KDT:OIZT, non tender, + BS, do not palpate liver spleen or masses Ext:no lower ext edema, 2+ pedal pulses, 2+ radial pulses Neuro:alert and oriented, MAE, follows commands, + facial symmetry    EKG:  EKG is ordered today. The ekg ordered today demonstrates SB at 47 no other changes compared to 01-19-15.    Recent Labs: 10/28/2014: ALT 31; BUN 15; Creatinine 1.1; Hemoglobin 14.2; Platelets 131.0*; Potassium 4.6; Sodium 139    Lipid Panel    Component Value Date/Time   CHOL 166 10/28/2014 1546   TRIG 149.0 10/28/2014 1546   HDL 32.50* 10/28/2014 1546   CHOLHDL 5 10/28/2014 1546   VLDL 29.8 10/28/2014 1546   LDLCALC 104* 10/28/2014 1546   LDLDIRECT 91.6 01/06/2014 1443       Other studies Reviewed: Additional studies/ records that were reviewed today include: office visits- previous studies..   ASSESSMENT AND PLAN:  1.  CAD- hx CABG in 2000, no active chest pain but will order his exercise myoview for this year- done every 3 years post CABG.  He will then follow up with Dr. Corky Downs - he will also begin exercising 30 min daily.  All meds refilled except protonix which is per GI.  We refilled his viagra as well.   2.  Hyperlipidemia, LDL is higher than we would like goal <70, but pt was not taking his fish oil then.  He has resumed.   3.  Sinus brady  HR 47 today, up from 45 last year, this is after decreasing his BB.  I am decreasing again to 12.5  mg BID and for his BP increase his ACE.  4. HTN  Elevated today, I increased his lisinopril to 40 mg daily, last Cr in 10/2014 was 1.1   Current medicines are reviewed with the patient today.  The patient Has no concerns regarding medicines.  The following changes have been made:  See above Labs/ tests ordered today include:see above  Disposition:   FU:  see above  Signed, Isaiah Serge, NP  01/31/2015 9:18 AM    [facility] [facility], [address], [place], [place] Phone: [phone])  [phone]; Fax: [phone]

## 2015-03-25 DIAGNOSIS — D225 Melanocytic nevi of trunk: Secondary | ICD-10-CM | POA: Diagnosis not present

## 2015-03-25 DIAGNOSIS — D2261 Melanocytic nevi of right upper limb, including shoulder: Secondary | ICD-10-CM | POA: Diagnosis not present

## 2015-03-25 DIAGNOSIS — Z85828 Personal history of other malignant neoplasm of skin: Secondary | ICD-10-CM | POA: Diagnosis not present

## 2015-03-25 DIAGNOSIS — D485 Neoplasm of uncertain behavior of skin: Secondary | ICD-10-CM | POA: Diagnosis not present

## 2015-03-25 DIAGNOSIS — D0462 Carcinoma in situ of skin of left upper limb, including shoulder: Secondary | ICD-10-CM | POA: Diagnosis not present

## 2015-03-25 DIAGNOSIS — D2262 Melanocytic nevi of left upper limb, including shoulder: Secondary | ICD-10-CM | POA: Diagnosis not present

## 2015-03-31 ENCOUNTER — Telehealth (HOSPITAL_COMMUNITY): Payer: Self-pay | Admitting: *Deleted

## 2015-04-08 ENCOUNTER — Inpatient Hospital Stay (HOSPITAL_COMMUNITY): Admission: RE | Admit: 2015-04-08 | Source: Ambulatory Visit

## 2015-04-15 DIAGNOSIS — D0462 Carcinoma in situ of skin of left upper limb, including shoulder: Secondary | ICD-10-CM | POA: Diagnosis not present

## 2015-04-19 ENCOUNTER — Ambulatory Visit: Admitting: Cardiovascular Disease

## 2015-04-21 ENCOUNTER — Telehealth (HOSPITAL_COMMUNITY): Payer: Self-pay

## 2015-04-21 NOTE — Telephone Encounter (Signed)
Encounter complete. 

## 2015-04-26 ENCOUNTER — Ambulatory Visit (HOSPITAL_COMMUNITY)
Admission: RE | Admit: 2015-04-26 | Discharge: 2015-04-26 | Disposition: A | Discharge status: Home / Self Care | Payer: Medicare Other | Source: Ambulatory Visit | Attending: Cardiology | Admitting: Cardiology

## 2015-04-26 DIAGNOSIS — R55 Syncope and collapse: Secondary | ICD-10-CM | POA: Diagnosis not present

## 2015-04-26 DIAGNOSIS — R0609 Other forms of dyspnea: Secondary | ICD-10-CM | POA: Diagnosis not present

## 2015-04-26 DIAGNOSIS — Z951 Presence of aortocoronary bypass graft: Secondary | ICD-10-CM | POA: Diagnosis not present

## 2015-04-26 LAB — MYOCARDIAL PERFUSION IMAGING
Estimated workload: 10.1 METS
Exercise duration (min): 8 min
LV dias vol: 130 mL
LV sys vol: 62 mL
MPHR: 155 {beats}/min
Nuc Stress EF: 52 %
Peak HR: 133 {beats}/min
Percent HR: 85 %
RPE: 16
SDS: 0
SRS: 0
SSS: 0
TID: 1.02

## 2015-04-26 MED ORDER — TECHNETIUM TC 99M SESTAMIBI GENERIC - CARDIOLITE
10.8000 | Freq: Once | INTRAVENOUS | Status: AC | PRN
  Administered 2015-04-26: 11 via INTRAVENOUS

## 2015-04-26 MED ORDER — TECHNETIUM TC 99M SESTAMIBI GENERIC - CARDIOLITE
31.0000 | Freq: Once | INTRAVENOUS | Status: AC | PRN
  Administered 2015-04-26: 31 via INTRAVENOUS

## 2015-04-27 ENCOUNTER — Telehealth: Payer: Self-pay | Admitting: Cardiovascular Disease

## 2015-04-27 NOTE — Telephone Encounter (Signed)
Closed encounter °

## 2015-05-02 ENCOUNTER — Encounter: Payer: Self-pay | Admitting: Cardiovascular Disease

## 2015-05-02 ENCOUNTER — Ambulatory Visit (INDEPENDENT_AMBULATORY_CARE_PROVIDER_SITE_OTHER): Payer: Medicare Other | Admitting: Cardiovascular Disease

## 2015-05-02 VITALS — BP 122/68 | HR 62 | Ht 71.0 in | Wt 225.8 lb

## 2015-05-02 DIAGNOSIS — I2583 Coronary atherosclerosis due to lipid rich plaque: Secondary | ICD-10-CM

## 2015-05-02 DIAGNOSIS — E785 Hyperlipidemia, unspecified: Secondary | ICD-10-CM

## 2015-05-02 DIAGNOSIS — N529 Male erectile dysfunction, unspecified: Secondary | ICD-10-CM

## 2015-05-02 DIAGNOSIS — I25810 Atherosclerosis of coronary artery bypass graft(s) without angina pectoris: Secondary | ICD-10-CM | POA: Diagnosis not present

## 2015-05-02 DIAGNOSIS — Z79899 Other long term (current) drug therapy: Secondary | ICD-10-CM

## 2015-05-02 DIAGNOSIS — I2510 Atherosclerotic heart disease of native coronary artery without angina pectoris: Secondary | ICD-10-CM

## 2015-05-02 DIAGNOSIS — I10 Essential (primary) hypertension: Secondary | ICD-10-CM | POA: Diagnosis not present

## 2015-05-02 MED ORDER — METOPROLOL TARTRATE 25 MG PO TABS: ORAL_TABLET | ORAL | Status: DC

## 2015-05-02 MED ORDER — EZETIMIBE 10 MG PO TABS: 10.0000 mg | ORAL_TABLET | Freq: Every day | ORAL | Status: DC

## 2015-05-02 NOTE — Progress Notes (Signed)
Patient ID: Christian Cole, male   DOB: [birth year], 64 y.o.   MRN: [id number]     HPI: Christian Cole is a 64 y.o. male the presents for one year cardiology evaluation.  Christian Cole has known CAD and underwent initial percutaneous coronary intervention in 1998 of an unknown vessel. He underwent CABG revascularization surgery in December 2000 with LIMA to LAD, LIMA to the PDA, vein to the diagonal, and then to the PLA. 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  An echo Doppler study in January 2002 showed normal systolic function. His mild TR and mild Christian. A nuclear perfusion study in March 2013 showed normal perfusion without scar or ischemia.  Since I last saw him one year ago he has continued to do well.  He remains active.  He denies any symptoms of chest pain.  He he had seen Cecilie Kicks in March 2016 and she had reduced his metoprolol to 12.5 mg twice a day due to asymptomatic bradycardia.  He had blood work in December 2015 by his primary care physician and his total cholesterol was 166, HDL 32, LDL 104, VLDL 29 and triglycerides 149.  He has been maintained on atorvastatin 40 mg, Plavix 75 mg, pantoprazole 40 mg, as well as Viagra as needed.  He underwent a recent nuclear perfusion study on April 26, 2015.  He remained asymptomatic.  He developed asymptomatic ST segment depression during stress.  Scintigraphic images were entirely normal.  He had normal function.  Ejection fraction was 52%.  He presents for evaluation.  He continues to use CPAP with 100% compliance.  He denies residual snoring.  He denies any residual daytime sleepiness.  He cannot sleep without it.     Past Medical History  Diagnosis Date  . GERD (gastroesophageal reflux disease)   . Hypertension     Followed by Dr. Claiborne Billings  . Hyperlipidemia   . CAD (coronary artery disease)       s/p CABG x 4  . ED (erectile dysfunction)   . OSA on CPAP   . Sinus bradycardia     Past Surgical History  Procedure Laterality Date  . Shoulder surgery  2010    bone spur   . Knee debridement  1989    Fluid flush  . Coronary artery bypass graft  2000    LIMA to the LAD,RIMA to the PDA,vein graft to the diagonal & vein graft to the PLA  . Cataract extraction, bilateral  2010  . US echocardiography  12/12/2010    mild LA dilatation, normal LV systolic fx, EF > 95%    No Known Allergies  Current Outpatient Prescriptions  Medication Sig Dispense Refill  . atorvastatin (LIPITOR) 40 MG tablet Take 1 tablet (40 mg total) by mouth daily at 6 PM. 90 tablet 3  . benazepril (LOTENSIN) 40 MG tablet Take 1 tablet (40 mg total) by mouth daily. 90 tablet 3  . clopidogrel (PLAVIX) 75 MG tablet Take 1 tablet (75 mg total) by mouth daily. 90 tablet 3  . metoprolol tartrate (LOPRESSOR) 25 MG tablet Take 0.5 tablets (12.5 mg total) by mouth 2 (two) times daily. 90 tablet 3  . pantoprazole (PROTONIX) 40 MG tablet TAKE 1 TABLET DAILY 1 HOUR BEFORE A MEAL    . sildenafil (VIAGRA) 100 MG tablet Take 1 tablet (100 mg total) by mouth as needed for erectile dysfunction. 30 tablet 3  No current facility-administered medications for this visit.    History   Social History  . Marital Status: Married    Spouse Name: N/A  . Number of Children: 3  . Years of Education: 16   Occupational History  . Retired Corporate treasurer   . Management/Technical [place]   Social History Main Topics  . Smoking status: Former Smoker -- 40 years    Types: Cigarettes    Quit date: 12/02/2014  . Smokeless tobacco: Not on file  . Alcohol Use: 6.0 oz/week    10 Cans of beer per week     Comment: Down to 2 beers a day from 4-6 a day   . Drug Use: No  . Sexual Activity:    Partners: Female     Comment: Wife   Other Topics Concern  . Not on file   Social History Narrative   Christian Cole grew up in [place].  He is currently living in [place] with his wife. This is his second marriage. He has 1 daughter and 2 sons from his first marriage. His daughter lives in [place], one son in [place], [place] and the other son in [place], [place]. 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 He enjoys wood working on his spare time. 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     Family History  Problem Relation Age of Onset  . Arthritis Mother   . Heart disease Father     CAD - MI  . Stroke Father   . Hypertension Father   . Cancer Father 59    esophageal cancer   . Arthritis Maternal Grandmother     ROS General: Negative; No fevers, chills, or night sweats;  HEENT: Negative; No changes in vision or hearing, sinus congestion, difficulty swallowing Pulmonary: Negative; No cough, wheezing, shortness of breath, hemoptysis Cardiovascular: See history of present illness GI: Negative; No nausea, vomiting, diarrhea, or abdominal pain GU: Negative; No dysuria, hematuria, or difficulty voiding Musculoskeletal: Negative; no myalgias, joint pain, or weakness Hematologic/Oncology: Negative; no easy bruising, bleeding Endocrine: Negative; no heat/cold intolerance; no diabetes Neuro: Negative; no changes in balance, headaches Skin: Negative; No rashes or skin lesions Psychiatric: Negative; No behavioral problems, depression Sleep: Positive for OSA on CPAP therapy.  No snoring, daytime sleepiness, hypersomnolence, bruxism, restless legs, hypnogognic hallucinations, no cataplexy Other comprehensive 14 point system review is negative.  PE BP 122/68 mmHg  Pulse 62  Ht _0  (1.803 m)  Wt 225 lb 12.8 oz (102.422 kg)  BMI 31.51 kg/m2  Wt Readings from Last 3 Encounters:  05/02/15 225 lb 12.8 oz (102.422 kg)  04/26/15 234 lb  (106.142 kg)  01/31/15 234 lb 9.6 oz (106.414 kg)   General: Alert, oriented, no distress.  Skin: normal turgor, no rashes HEENT: Normocephalic, atraumatic. Pupils round and reactive; sclera anicteric;no lid lag. Extraocular muscles intact;; no xanthelasmas. Nose without nasal septal hypertrophy Mouth/Parynx benign; Mallinpatti scale 3 Neck: No JVD, no carotid bruits; normal carotid upstroke Lungs: clear to ausculatation and percussion; no wheezing or rales Chest wall: no tenderness to palpitation Heart: RRR, s1 s2 normal; 1/6 systolic murmur. no diastolic murmur, rub thrills or heaves Abdomen: Mild central adiposity soft, nontender; no hepatosplenomehaly, BS+; abdominal aorta nontender and not dilated by palpation. Back: no CVA tenderness Pulses  2+ Extremities: no clubbing cyanosis or edema, Homan's sign negative  Neurologic: grossly nonfocal; cranial nerves grossly normal. Psychologic: normal affect and mood.  ECG (independently read by me): not done today due to recent treadmill nuclear study  ECG (independently read by me): Sinus bradycardia at 45 beats per minute. Normal intervals. Q waves in lead 3 and small Q wave in aVF.  LABS: BMP Latest Ref Rng 10/28/2014 01/06/2014  Glucose 70 - 99 mg/dL 92 86  BUN 6 - 23 mg/dL 15 13  Creatinine 0.4 - 1.5 mg/dL 1.1 1.0  Sodium 135 - 145 mEq/L 139 138  Potassium 3.5 - 5.1 mEq/L 4.6 4.5  Chloride 96 - 112 mEq/L 104 102  CO2 19 - 32 mEq/L 27 28  Calcium 8.4 - 10.5 mg/dL 9.2 9.4   Hepatic Function Latest Ref Rng 10/28/2014 01/06/2014  Total Protein 6.0 - 8.3 g/dL 7.0 7.2  Albumin 3.5 - 5.2 g/dL 4.5 4.3  AST 0 - 37 U/L 27 23  ALT 0 - 53 U/L 31 31  Alk Phosphatase 39 - 117 U/L 96 83  Total Bilirubin 0.2 - 1.2 mg/dL 1.1 1.0   CBC Latest Ref Rng 10/28/2014 01/26/2014 01/06/2014  WBC 4.0 - 10.5 K/uL 8.1 7.8 8.5  Hemoglobin 13.0 - 17.0 g/dL 14.2 13.0 14.8  Hematocrit 39.0 - 52.0 % 43.0 39.6 44.6  Platelets 150.0 - 400.0 K/uL 131.0(L) 115.0  Repeated and verified X2.(L) 120.0(L)   Lab Results  Component Value Date   MCV 93.1 10/28/2014   MCV 94.8 01/26/2014   MCV 95.1 01/06/2014   Lab Results  Component Value Date   TSH 1.11 01/06/2014  No results found for: HGBA1C  Lipid Panel     Component Value Date/Time   CHOL 166 10/28/2014 1546   TRIG 149.0 10/28/2014 1546   HDL 32.50* 10/28/2014 1546   CHOLHDL 5 10/28/2014 1546   VLDL 29.8 10/28/2014 1546   LDLCALC 104* 10/28/2014 1546   LDLDIRECT 91.6 01/06/2014 1443    RADIOLOGY: No results found.    ASSESSMENT AND PLAN: Christian Cole is a 64 year old gentleman with history of CAD who underwent initial intervention in 1998 and due to progressive CAD underwent CABG surgery 16 years ago. His  nuclear perfusion study in 2013 remained normal without scar or ischemia.  A remote tobacco history but he now has discontinued this.  He underwent a recent three-year follow-up nuclear perfusion study.  This continues to show entirely normal perfusion without scar or ischemia.  However, he developed asymptomatic ST depression during stress.  For this reason, it was interpreted as intermediate study.  Post-rest ejection fraction was 52%.  There were no wall motion abnormalities.  When he was seen several months ago by Mickel Baas his dose of metoprolol was reduced to 12.5 mg twice a day.  In light of his asymptomatic ECG changes on his stress test, I am recommending that he slightly increase Lopressor back to 25 mg in the morning.  He will continue to 12.5 mg at night.  His LDL is not at target.  At this point, I will add Zetia 10 mg to his atorvastatin 40 mg for more aggressive lipid therapy with a target LDL less than 70.  He continues to use CPAP therapy with 100% compliance with reference to his obstructive sleep apnea.  He remains active.  He is mildly obese with a BMI of 31.5.  Weight reduction was recommended.  His blood pressure today is controlled on Lotensin 40 mg in addition  to his  metoprolol.  He has GERD for which he takes Protonix.  He also has a history of erectile dysfunction for which he takes Viagra on an as-needed basis.  He continues to be on in anti-platelet therapy with Plavix and denies bleeding.  In 6 weeks repeat blood work will be obtained.  I will see him in 6 months for cardiology reevaluation.   Christian Sine, MD, [facility]  05/02/2015 8:10 AM

## 2015-05-02 NOTE — Patient Instructions (Signed)
Your physician has recommended you make the following change in your medication: increase the lopressor. Take 1 tablet in the morning nd 1/2 tablet in the evening.start new prescription for zetia. This has already been sent to your pharmacy.  Your physician recommends that you return for lab work fasting.  Your physician wants you to follow-up in: 6 months or sooner if needed. You will receive a reminder letter in the mail two months in advance. If you don't receive a letter, please call our office to schedule the follow-up appointment.

## 2015-07-28 ENCOUNTER — Ambulatory Visit: Admitting: Cardiovascular Disease

## 2015-10-14 DIAGNOSIS — Z85828 Personal history of other malignant neoplasm of skin: Secondary | ICD-10-CM | POA: Diagnosis not present

## 2015-10-14 DIAGNOSIS — L821 Other seborrheic keratosis: Secondary | ICD-10-CM | POA: Diagnosis not present

## 2015-10-14 DIAGNOSIS — L570 Actinic keratosis: Secondary | ICD-10-CM | POA: Diagnosis not present

## 2015-12-21 ENCOUNTER — Other Ambulatory Visit: Payer: Self-pay | Admitting: Cardiology

## 2015-12-21 NOTE — Telephone Encounter (Signed)
Rx(s) sent to pharmacy electronically.  

## 2016-01-26 ENCOUNTER — Other Ambulatory Visit: Payer: Self-pay | Admitting: Cardiology

## 2016-01-26 NOTE — Telephone Encounter (Signed)
REFILL 

## 2016-02-27 ENCOUNTER — Other Ambulatory Visit: Payer: Self-pay | Admitting: Cardiology

## 2016-02-27 NOTE — Telephone Encounter (Signed)
Rx(s) sent to pharmacy electronically.  

## 2016-03-15 ENCOUNTER — Other Ambulatory Visit: Payer: Self-pay | Admitting: Cardiology

## 2016-03-15 NOTE — Telephone Encounter (Signed)
Rx(s) sent to pharmacy electronically.  

## 2016-03-23 DIAGNOSIS — X32XXXA Exposure to sunlight, initial encounter: Secondary | ICD-10-CM | POA: Diagnosis not present

## 2016-03-23 DIAGNOSIS — Z85828 Personal history of other malignant neoplasm of skin: Secondary | ICD-10-CM | POA: Diagnosis not present

## 2016-03-23 DIAGNOSIS — L821 Other seborrheic keratosis: Secondary | ICD-10-CM | POA: Diagnosis not present

## 2016-03-23 DIAGNOSIS — Z08 Encounter for follow-up examination after completed treatment for malignant neoplasm: Secondary | ICD-10-CM | POA: Diagnosis not present

## 2016-03-23 DIAGNOSIS — L570 Actinic keratosis: Secondary | ICD-10-CM | POA: Diagnosis not present

## 2016-04-07 ENCOUNTER — Other Ambulatory Visit: Payer: Self-pay | Admitting: Cardiovascular Disease

## 2016-04-10 NOTE — Telephone Encounter (Signed)
Rx(s) sent to pharmacy electronically.  

## 2016-04-20 ENCOUNTER — Ambulatory Visit: Payer: Self-pay | Admitting: Family Medicine

## 2016-04-21 ENCOUNTER — Other Ambulatory Visit: Payer: Self-pay | Admitting: Cardiology

## 2016-04-23 NOTE — Telephone Encounter (Signed)
viagra refill refused as patient is overdue for appt with MD

## 2016-04-25 ENCOUNTER — Other Ambulatory Visit: Payer: Self-pay | Admitting: Cardiology

## 2016-05-03 ENCOUNTER — Ambulatory Visit (INDEPENDENT_AMBULATORY_CARE_PROVIDER_SITE_OTHER): Payer: Medicare Other | Admitting: Family Medicine

## 2016-05-03 ENCOUNTER — Telehealth: Payer: Self-pay | Admitting: Cardiovascular Disease

## 2016-05-03 ENCOUNTER — Encounter: Payer: Self-pay | Admitting: Family Medicine

## 2016-05-03 VITALS — BP 130/62 | HR 47 | Temp 98.1°F | Ht 71.0 in | Wt 210.1 lb

## 2016-05-03 DIAGNOSIS — R6882 Decreased libido: Secondary | ICD-10-CM | POA: Insufficient documentation

## 2016-05-03 DIAGNOSIS — Z0000 Encounter for general adult medical examination without abnormal findings: Secondary | ICD-10-CM

## 2016-05-03 DIAGNOSIS — R739 Hyperglycemia, unspecified: Secondary | ICD-10-CM | POA: Diagnosis not present

## 2016-05-03 DIAGNOSIS — I10 Essential (primary) hypertension: Secondary | ICD-10-CM | POA: Diagnosis not present

## 2016-05-03 DIAGNOSIS — I2510 Atherosclerotic heart disease of native coronary artery without angina pectoris: Secondary | ICD-10-CM

## 2016-05-03 DIAGNOSIS — E785 Hyperlipidemia, unspecified: Secondary | ICD-10-CM

## 2016-05-03 LAB — COMPREHENSIVE METABOLIC PANEL
ALT: 20 U/L (ref 0–53)
AST: 18 U/L (ref 0–37)
Albumin: 4.2 g/dL (ref 3.5–5.2)
Alkaline Phosphatase: 74 U/L (ref 39–117)
BUN: 19 mg/dL (ref 6–23)
CO2: 29 mEq/L (ref 19–32)
Calcium: 9.4 mg/dL (ref 8.4–10.5)
Chloride: 108 mEq/L (ref 96–112)
Creatinine, Ser: 1.07 mg/dL (ref 0.40–1.50)
GFR: 73.44 mL/min (ref >60.00)
Glucose, Bld: 119 mg/dL — ABNORMAL HIGH (ref 70–99)
Potassium: 4.4 mEq/L (ref 3.5–5.1)
Sodium: 140 mEq/L (ref 135–145)
Total Bilirubin: 0.6 mg/dL (ref 0.2–1.2)
Total Protein: 6.5 g/dL (ref 6.0–8.3)

## 2016-05-03 LAB — LIPID PANEL
Cholesterol: 116 mg/dL (ref 0–200)
HDL: 49.1 mg/dL (ref >39.00)
LDL Cholesterol: 52 mg/dL (ref 0–99)
NonHDL: 67.35
Total CHOL/HDL Ratio: 2
Triglycerides: 78 mg/dL (ref 0.0–149.0)
VLDL: 15.6 mg/dL (ref 0.0–40.0)

## 2016-05-03 LAB — CBC
HCT: 39.3 % (ref 39.0–52.0)
Hemoglobin: 13 g/dL (ref 13.0–17.0)
MCHC: 33 g/dL (ref 30.0–36.0)
MCV: 93.8 fl (ref 78.0–100.0)
Platelets: 110 10*3/uL — ABNORMAL LOW (ref 150.0–400.0)
RBC: 4.19 Mil/uL — ABNORMAL LOW (ref 4.22–5.81)
RDW: 16.2 % — ABNORMAL HIGH (ref 11.5–15.5)
WBC: 8 10*3/uL (ref 4.0–10.5)

## 2016-05-03 LAB — HEMOGLOBIN A1C: Hgb A1c MFr Bld: 6 % (ref 4.6–6.5)

## 2016-05-03 MED ORDER — PNEUMOCOCCAL 13-VAL CONJ VACC IM SUSP: 0.5000 mL | INTRAMUSCULAR | Status: DC

## 2016-05-03 MED ORDER — ZOSTER VACCINE LIVE 19400 UNT/0.65ML ~~LOC~~ SUSR: 0.6500 mL | Freq: Once | SUBCUTANEOUS | Status: DC

## 2016-05-03 NOTE — Assessment & Plan Note (Signed)
Stable. Has upcoming cardiology follow-up. Continue ACE inhibitor, statin, Plavix, beta blocker. I encouraged him to discuss potential switch of his metoprolol given the low dose and continued bradycardia. Would also recommend he start aspirin. Defer to cardiology.

## 2016-05-03 NOTE — Assessment & Plan Note (Signed)
Unsure of control. Lipid panel today.  Continue Zetia and Lipitor.

## 2016-05-03 NOTE — Assessment & Plan Note (Signed)
Well controlled on Benzepril and Metoprolol.

## 2016-05-03 NOTE — Assessment & Plan Note (Signed)
New problem. Will proceed with testosterone testing. Needs to return for this (first thing in am). Will place future order.

## 2016-05-03 NOTE — Telephone Encounter (Signed)
New Message  Pt calling to speak w/ RN about following up- did not want to sched w/ APP. Please call back and discuss.

## 2016-05-03 NOTE — Progress Notes (Signed)
Pre visit review using our clinic review tool, if applicable. No additional management support is needed unless otherwise documented below in the visit note. 

## 2016-05-03 NOTE — Telephone Encounter (Signed)
Spoke to patient. Notes no acute concerns. Rationale for not seeing PA was that at last visit, Dr. Claiborne Billings expressly recommended pt follow up with him only. He is aware Dr. Evette Georges schedule is booked out for the summer. Pt just wants to make sure he will be OK if his follow up is later since he is now technically overdue for 1 year f/u. Pt did not want to lose Dr. Claiborne Billings as his cardiologist. I acknowledged his concerns and gave him reassurance. Also made sure he knows we will continue to honor his refills to carry him through to next appt. Pt aware I will send msg to scheduling to arrange visit. Pt voiced thanks.

## 2016-05-03 NOTE — Patient Instructions (Signed)
Continue your current meds.  Discuss the beta blocker with cardiology.  We will call with your lab results.  Call regarding the day for you testosterone.  Follow up in 6 months to 1 year.  Take care  Dr. Lacinda Axon

## 2016-05-03 NOTE — Assessment & Plan Note (Signed)
Rx given for PCV 13 and zostavax.

## 2016-05-03 NOTE — Progress Notes (Signed)
Subjective:  Patient ID: Christian Cole, male    DOB: 06-29-1951  Age: 65 y.o. MRN: [id number]  CC: Follow up, Labs  HPI:  65 year old male with a PMH of CAD s/p CABG & PCI, HTN, HLD presents for follow up. He would like to discuss testosterone testing and pneumococcal vaccine as well as shingles vaccine.  CAD  Stable.  No recent chest pain or shortness breath.  Compliant with Plavix, Lipitor, Zetia, benazepril, metoprolol. I'm unsure why he is not on aspirin in addition to the Plavix.  HTN  Well controlled.  Currently on Benzapril and Metoprolol.  His metoprolol has been titrated down and he is currently only taking 12.5 mg daily due to bradycardia.  HLD  Last lipid panel was in 2015. LDL was 91.  Unsure of control at this time.  Would advocate for a goal of less than 70.  He is currently taking Lipitor and Zetia with no reported side effects.  Concern for low T  Patient states that he's been experiencing decreased sex drive and loss of hair.  He does not report any fatigue.  He is concerned about potential for low testosterone. He would like to discuss testing today.  Social Hx   Social History   Social History  . Marital Status: Married    Spouse Name: N/A  . Number of Children: 3  . Years of Education: 16   Occupational History  . Retired Corporate treasurer   . Management/Technical [employer]   Social History Main Topics  . Smoking status: Former Smoker -- 40 years    Types: Cigarettes    Quit date: 12/02/2014  . Smokeless tobacco: None  . Alcohol Use: 6.0 oz/week    10 Cans of beer per week     Comment: Down to 2 beers a day from 4-6 a day   . Drug Use: No  . Sexual Activity:    Partners: Female     Comment: Wife   Other Topics Concern  . None   Social History Narrative   Pat grew up in [place]. He is currently living in [place] with his wife. This is his second marriage. He has 1 daughter and 2 sons from his first marriage.  His daughter lives in [place], one son in [place], [place] and the other son in [place], [place]. 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 He enjoys wood working on his spare time. 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     Review of Systems  Respiratory: Negative for shortness of breath.   Cardiovascular: Negative for chest pain.  Genitourinary:       Decreased sex drive.   Objective:  BP 130/62 mmHg  Pulse 47  Temp(Src) 98.1 F (36.7 C) (Oral)  Ht '5\' 11"'$  (1.803 m)  Wt 210 lb 2 oz (95.312 kg)  BMI 29.32 kg/m2  SpO2 96%  BP/Weight 05/03/2016 05/02/2015 9/62/2297  Systolic BP 989 211 -  Diastolic BP 62 68 -  Wt. (Lbs) 210.13 225.8 234  BMI 29.32 31.51 32.65   Physical Exam  Constitutional: He is oriented to person, place, and time. He appears well-developed. No distress.  Cardiovascular: Normal rate and regular rhythm.   Pulmonary/Chest: Effort normal. He has no wheezes. He has no rales.  Musculoskeletal: He exhibits no edema.  Neurological: He is alert and oriented to person, place, and time.  Psychiatric: He has a normal mood and affect.  Vitals reviewed.  Lab Results  Component Value Date   WBC 8.1 10/28/2014   HGB 14.2 10/28/2014   HCT 43.0 10/28/2014   PLT 131.0* 10/28/2014   GLUCOSE 92 10/28/2014   CHOL 166 10/28/2014   TRIG 149.0 10/28/2014   HDL 32.50* 10/28/2014   LDLDIRECT 91.6 01/06/2014   LDLCALC 104* 10/28/2014   ALT 31 10/28/2014   AST 27 10/28/2014   NA 139 10/28/2014   K 4.6 10/28/2014   CL 104 10/28/2014   CREATININE 1.1 10/28/2014   BUN 15 10/28/2014   CO2 27 10/28/2014   TSH 1.11 01/06/2014   PSA 0.69 01/06/2014   Assessment & Plan:   Problem List Items Addressed This Visit    HLD (hyperlipidemia)    Unsure of control. Lipid panel today.  Continue  Zetia and Lipitor.       Relevant Orders   Lipid Profile   HTN (hypertension)    Well controlled on Benzepril and Metoprolol.       Relevant Orders   Comp Met (CMET)   HgB A1c   CAD (coronary artery disease) - Primary    Stable. Has upcoming cardiology follow-up. Continue ACE inhibitor, statin, Plavix, beta blocker. I encouraged him to discuss potential switch of his metoprolol given the low dose and continued bradycardia. Would also recommend he start aspirin. Defer to cardiology.      Relevant Orders   CBC   Comp Met (CMET)   HgB A1c   Decreased sex drive    New problem. Will proceed with testosterone testing. Needs to return for this (first thing in am). Will place future order.      Preventative health care    Rx given for PCV 13 and zostavax.       Other Visit Diagnoses    Blood glucose elevated        Relevant Orders    HgB A1c       Meds ordered this encounter  Medications  . Zoster Vaccine Live, PF, (ZOSTAVAX) 24097 UNT/0.65ML injection    Sig: Inject 19,400 Units into the skin once.    Dispense:  1 each    Refill:  0  . pneumococcal 13-valent conjugate vaccine (PREVNAR 13) SUSP injection    Sig: Inject 0.5 mLs into the muscle tomorrow at 10 am.    Dispense:  0.5 mL    Refill:  0    Follow-up: 6 months to 1 year.   [place]

## 2016-05-08 ENCOUNTER — Telehealth: Payer: Self-pay | Admitting: *Deleted

## 2016-05-08 NOTE — Telephone Encounter (Signed)
Patient requested lab results from 05/03/16 Pt contact [phone]

## 2016-05-09 NOTE — Telephone Encounter (Signed)
LVTCB

## 2016-05-09 NOTE — Telephone Encounter (Signed)
Sent via mychart by CMA

## 2016-05-29 ENCOUNTER — Other Ambulatory Visit: Payer: Self-pay | Admitting: Cardiovascular Disease

## 2016-06-13 ENCOUNTER — Other Ambulatory Visit: Payer: Self-pay | Admitting: Cardiovascular Disease

## 2016-06-13 NOTE — Telephone Encounter (Signed)
Rx request sent to pharmacy.  

## 2016-06-14 ENCOUNTER — Other Ambulatory Visit: Payer: Self-pay | Admitting: Cardiovascular Disease

## 2016-07-09 ENCOUNTER — Other Ambulatory Visit: Payer: Self-pay | Admitting: Cardiovascular Disease

## 2016-07-09 NOTE — Telephone Encounter (Signed)
Rx request sent to pharmacy.  

## 2016-07-09 NOTE — Telephone Encounter (Signed)
Rx(s) sent to pharmacy electronically.  

## 2016-07-23 ENCOUNTER — Telehealth: Payer: Self-pay | Admitting: Cardiovascular Disease

## 2016-07-23 NOTE — Telephone Encounter (Signed)
Returned patient call. Informs me he has not been able to get in to see Dr. Claiborne Billings (last seen in June 2016, next scheduled Oct 2017 d/t provider availability.) Pt informs me he has a pending 5-tooth extraction. Notes "dentist wasn't going to contact you guys, left that to me".  He is on Plavix and seeking advice on stopping this medication for the extraction. I informed patient I would need to send this to his physician to review, and that the minimum hold time on Plavix is typically 5 days. Pt voiced understanding and will await return call for recommendations. He is asking for a same-day notification on this as optimally he is getting this done either end of the week or early next week. Will defer to [facility] for recommendation.

## 2016-07-23 NOTE — Telephone Encounter (Signed)
New message    Pt states he has a dental procedure and has questions about it. Please call.

## 2016-07-24 NOTE — Telephone Encounter (Signed)
Okay to hold Plavix for 5 days pre-op Restart 1-2 days postop  Glenetta Hew, MD

## 2016-07-24 NOTE — Telephone Encounter (Signed)
Recommendations communicated to patient, who voiced understanding. Notes he will probably need to bump back extraction date (was initially scheduled for this Friday). I advised this is wise - would need to be off med for the full 5 days prior to extraction. Pt acknowledged instructions. I have informed him he should call if he has any further questions. Aware that if dentist office needs clearance note from [place] to have them contact us and we can send. Pt voiced understanding and thanks.

## 2016-07-25 ENCOUNTER — Other Ambulatory Visit: Payer: Self-pay | Admitting: Cardiovascular Disease

## 2016-08-21 ENCOUNTER — Ambulatory Visit (INDEPENDENT_AMBULATORY_CARE_PROVIDER_SITE_OTHER): Payer: Medicare Other | Admitting: Cardiovascular Disease

## 2016-08-21 ENCOUNTER — Encounter: Payer: Self-pay | Admitting: Cardiovascular Disease

## 2016-08-21 VITALS — BP 146/86 | HR 44 | Ht 71.0 in | Wt 201.2 lb

## 2016-08-21 DIAGNOSIS — E785 Hyperlipidemia, unspecified: Secondary | ICD-10-CM | POA: Diagnosis not present

## 2016-08-21 DIAGNOSIS — I2510 Atherosclerotic heart disease of native coronary artery without angina pectoris: Secondary | ICD-10-CM | POA: Diagnosis not present

## 2016-08-21 DIAGNOSIS — G4733 Obstructive sleep apnea (adult) (pediatric): Secondary | ICD-10-CM

## 2016-08-21 DIAGNOSIS — I10 Essential (primary) hypertension: Secondary | ICD-10-CM | POA: Diagnosis not present

## 2016-08-21 MED ORDER — BENAZEPRIL HCL 40 MG PO TABS: 40.0000 mg | ORAL_TABLET | Freq: Every day | ORAL | 3 refills | Status: DC

## 2016-08-21 MED ORDER — EZETIMIBE 10 MG PO TABS: 10.0000 mg | ORAL_TABLET | Freq: Every day | ORAL | 3 refills | Status: DC

## 2016-08-21 MED ORDER — METOPROLOL TARTRATE 25 MG PO TABS: 12.5000 mg | ORAL_TABLET | Freq: Two times a day (BID) | ORAL | 3 refills | Status: DC

## 2016-08-21 MED ORDER — ATORVASTATIN CALCIUM 40 MG PO TABS: ORAL_TABLET | ORAL | 3 refills | Status: DC

## 2016-08-21 MED ORDER — CLOPIDOGREL BISULFATE 75 MG PO TABS: 75.0000 mg | ORAL_TABLET | Freq: Every day | ORAL | 3 refills | Status: DC

## 2016-08-21 NOTE — Patient Instructions (Signed)
Your physician wants you to follow-up in: 1 year or sooner if needed. You will receive a reminder letter in the mail two months in advance. If you don't receive a letter, please call our office to schedule the follow-up appointment.   If you need a refill on your cardiac medications before your next appointment, please call your pharmacy.   

## 2016-08-22 NOTE — Progress Notes (Signed)
Patient ID: Christian Cole, male   DOB: [birth year], 65 y.o.   MRN: [id number]     HPI: Christian Cole is a 65 y.o. male the presents for a 107 month cardiology evaluation.  Christian Cole has known CAD and underwent initial percutaneous coronary intervention in 1998 of an unknown vessel. He underwent CABG revascularization surgery in December 2000 with LIMA to LAD, LIMA to the PDA, vein to the diagonal, and then to the PLA. 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  An echo Doppler study in January 2002 showed normal systolic function. His mild TR and mild Christian. A nuclear perfusion study in March 2013 showed normal perfusion without scar or ischemia.  Since I last saw him one year ago he has continued to do well.  He remains active.  He denies any symptoms of chest pain.  He he had seen Christian Cole in March 2016 and she had reduced his metoprolol to 12.5 mg twice a day due to asymptomatic bradycardia.  He had blood work in December 2015 by his primary care physician and his total cholesterol was 166, HDL 32, LDL 104, VLDL 29 and triglycerides 149.  He has been maintained on atorvastatin 40 mg, Plavix 75 mg, pantoprazole 40 mg, as well as Viagra as needed.  He underwent a recent nuclear perfusion study on April 26, 2015.  He remained asymptomatic.  He developed asymptomatic ST segment depression during stress.  Scintigraphic images were entirely normal.  He had normal function.  Ejection fraction was 52%.  He presents for evaluation.  Over the past year, Christian. Cole denies any episodes of chest pain.  He is exercising daily and walks at least 11/2 miles.  He has lost over 40 pounds since February and has more energy.  His peak weight was 242 and his weight this morning without clothes at home was 195.  He continues to use CPAP with 100% compliance.  He denies  residual snoring.  He denies any residual daytime sleepiness.  He cannot sleep without it.  His CPAP machine starting to make noise.  He was wondering if he qualifies for new machine.  He has been getting supplies company out of [place].  He presents for evaluation.     Past Medical History:  Diagnosis Date  . CAD (coronary artery disease)    s/p CABG x 4  . ED (erectile dysfunction)   . GERD (gastroesophageal reflux disease)   . Hyperlipidemia   . Hypertension    Followed by Dr. Claiborne Billings  . OSA on CPAP   . Sinus bradycardia     Past Surgical History:  Procedure Laterality Date  . CATARACT EXTRACTION, BILATERAL  2010  . CORONARY ARTERY BYPASS GRAFT  2000   LIMA to the LAD,RIMA to the PDA,vein graft to the diagonal & vein graft to the PLA  . KNEE DEBRIDEMENT  1989   Fluid flush  . SHOULDER SURGERY  2010   bone spur   . US ECHOCARDIOGRAPHY  12/12/2010   mild LA dilatation, normal LV systolic fx, EF > 42%    No Known Allergies  Current Outpatient Prescriptions  Medication Sig Dispense Refill  . atorvastatin (LIPITOR) 40 MG tablet TAKE 1 TABLET DAILY 90 tablet 3  . benazepril (LOTENSIN) 40 MG tablet Take 1 tablet (40 mg total) by mouth daily. 90 tablet 3  . clopidogrel (PLAVIX) 75 MG tablet Take 1 tablet (  75 mg total) by mouth daily. 90 tablet 3  . DiphenhydrAMINE HCl (BENADRYL ALLERGY PO) Take 1 tablet by mouth at bedtime.    Marland Kitchen ezetimibe (ZETIA) 10 MG tablet Take 1 tablet (10 mg total) by mouth daily. 90 tablet 3  . ibuprofen (ADVIL,MOTRIN) 100 MG tablet Take 200 mg by mouth every 6 (six) hours as needed for fever.    . metoprolol tartrate (LOPRESSOR) 25 MG tablet Take 0.5 tablets (12.5 mg total) by mouth 2 (two) times daily. 180 tablet 3  . pantoprazole (PROTONIX) 40 MG tablet TAKE 1 TABLET DAILY 1 HOUR BEFORE A MEAL    . pneumococcal 13-valent conjugate vaccine (PREVNAR 13) SUSP injection Inject 0.5 mLs into the muscle tomorrow at 10 am. 0.5 mL 0  . sildenafil (VIAGRA) 100 MG  tablet Take 1 tablet (100 mg total) by mouth as needed for erectile dysfunction. 30 tablet 3  . Zoster Vaccine Live, PF, (ZOSTAVAX) 37628 UNT/0.65ML injection Inject 19,400 Units into the skin once. 1 each 0   No current facility-administered medications for this visit.     Social History   Social History  . Marital status: Married    Spouse name: N/A  . Number of children: 3  . Years of education: 31   Occupational History  . Retired Corporate treasurer   . Management/Technical [employer]   Social History Main Topics  . Smoking status: Former Smoker    Years: 40.00    Types: Cigarettes    Quit date: 12/02/2014  . Smokeless tobacco: Not on file  . Alcohol use 6.0 oz/week    10 Cans of beer per week     Comment: Down to 2 beers a day from 4-6 a day   . Drug use: No  . Sexual activity: Yes    Partners: Female     Comment: Wife   Other Topics Concern  . Not on file   Social History Narrative   Christian Cole grew up in [place]. He is currently living in [place] with his wife. This is his second marriage. He has 1 daughter and 2 sons from his first marriage. His daughter lives in [place], one son in [place], [place] and the other son in [place], [place]. 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 He enjoys wood working on his spare time. 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     Family History  Problem Relation Age of Onset  . Arthritis Mother   . Heart disease Father     CAD - MI  . Stroke Father   . Hypertension Father   . Cancer Father 80    esophageal cancer   . Arthritis Maternal Grandmother     ROS General: Negative; No fevers, chills, or night sweats;  HEENT: Negative; No changes in vision or hearing, sinus congestion, difficulty swallowing Pulmonary: Negative; No cough,  wheezing, shortness of breath, hemoptysis Cardiovascular: See history of present illness GI: Negative; No nausea, vomiting, diarrhea, or abdominal pain GU: Negative; No dysuria, hematuria, or difficulty voiding Musculoskeletal: Negative; no myalgias, joint pain, or weakness Hematologic/Oncology: Negative; no easy bruising, bleeding Endocrine: Negative; no heat/cold intolerance; no diabetes Neuro: Negative; no changes in balance, headaches Skin: Negative; No rashes or skin lesions Psychiatric: Negative; No behavioral problems, depression Sleep: Positive for OSA  on CPAP therapy.  No snoring, daytime sleepiness, hypersomnolence, bruxism, restless legs, hypnogognic hallucinations, no cataplexy Other comprehensive 14 point system review is negative.  PE BP (!) 146/86 (BP Location: Left Arm, Patient Position: Sitting, Cuff Size: Normal)   Pulse (!) 44   Ht '5\' 11"'$  (1.803 m)   Wt 201 lb 3.2 oz (91.3 kg)   BMI 28.06 kg/m    Repeat blood pressure by me was 144/85.  Wt Readings from Last 3 Encounters:  08/21/16 201 lb 3.2 oz (91.3 kg)  05/03/16 210 lb 2 oz (95.3 kg)  05/02/15 225 lb 12.8 oz (102.4 kg)   General: Alert, oriented, no distress.  Skin: normal turgor, no rashes HEENT: Normocephalic, atraumatic. Pupils round and reactive; sclera anicteric;no lid lag. Extraocular muscles intact;; no xanthelasmas. Nose without nasal septal hypertrophy Mouth/Parynx benign; Mallinpatti scale 3 Neck: No JVD, no carotid bruits; normal carotid upstroke Lungs: clear to ausculatation and percussion; no wheezing or rales Chest wall: no tenderness to palpitation Heart: RRR, s1 s2 normal; 1/6 systolic murmur. no diastolic murmur, rub thrills or heaves Abdomen: Mild central adiposity soft, nontender; no hepatosplenomehaly, BS+; abdominal aorta nontender and not dilated by palpation. Back: no CVA tenderness Pulses 2+ Extremities: no clubbing cyanosis or edema, Homan's sign negative  Neurologic: grossly  nonfocal; cranial nerves grossly normal. Psychologic: normal affect and mood.  ECG (independently read by me): Sinus bradycardia at 44 bpm with first degree AV block.  Q wave in leads III and F.  PR interval 224 ms.  June 2016 ECG (independently read by me): not done today due to recent treadmill nuclear study  ECG (independently read by me): Sinus bradycardia at 45 beats per minute. Normal intervals. Q waves in lead 3 and small Q wave in aVF.  LABS: BMP Latest Ref Rng & Units 05/03/2016 10/28/2014 01/06/2014  Glucose 70 - 99 mg/dL 119(H) 92 86  BUN 6 - 23 mg/dL '19 15 13  '$ Creatinine 0.40 - 1.50 mg/dL 1.07 1.1 1.0  Sodium 135 - 145 mEq/L 140 139 138  Potassium 3.5 - 5.1 mEq/L 4.4 4.6 4.5  Chloride 96 - 112 mEq/L 108 104 102  CO2 19 - 32 mEq/L '29 27 28  '$ Calcium 8.4 - 10.5 mg/dL 9.4 9.2 9.4   Hepatic Function Latest Ref Rng & Units 05/03/2016 10/28/2014 01/06/2014  Total Protein 6.0 - 8.3 g/dL 6.5 7.0 7.2  Albumin 3.5 - 5.2 g/dL 4.2 4.5 4.3  AST 0 - 37 U/L '18 27 23  '$ ALT 0 - 53 U/L '20 31 31  '$ Alk Phosphatase 39 - 117 U/L 74 96 83  Total Bilirubin 0.2 - 1.2 mg/dL 0.6 1.1 1.0   CBC Latest Ref Rng & Units 05/03/2016 10/28/2014 01/26/2014  WBC 4.0 - 10.5 K/uL 8.0 8.1 7.8  Hemoglobin 13.0 - 17.0 g/dL 13.0 14.2 13.0  Hematocrit 39.0 - 52.0 % 39.3 43.0 39.6  Platelets 150.0 - 400.0 K/uL 110.0(L) 131.0(L) 115.0 Repeated and verified X2.(L)   Lab Results  Component Value Date   MCV 93.8 05/03/2016   MCV 93.1 10/28/2014   MCV 94.8 01/26/2014   Lab Results  Component Value Date   TSH 1.11 01/06/2014   Lab Results  Component Value Date   HGBA1C 6.0 05/03/2016    Lipid Panel     Component Value Date/Time   CHOL 116 05/03/2016 1035   TRIG 78.0 05/03/2016 1035   HDL 49.10 05/03/2016 1035   CHOLHDL 2 05/03/2016 1035   VLDL 15.6 05/03/2016 1035   LDLCALC  52 05/03/2016 1035   LDLDIRECT 91.6 01/06/2014 1443    RADIOLOGY: No results found.    ASSESSMENT AND PLAN: Christian. Omauri Boeve is a 65 year old gentleman with history of CAD who underwent initial intervention in 1998 and due to progressive CAD underwent CABG surgery in 2000.Marland Kitchen His  nuclear perfusion study in 2013 remained normal without scar or ischemia.  A remote tobacco history but he now has discontinued this.  He underwent a recent three-year follow-up nuclear perfusion study on 04/26/2015 which continued to show entirely normal perfusion without scar or ischemia.  However, he developed asymptomatic ST depression during stress.  For this reason, it was interpreted as intermediate study.  Post-rest ejection fraction was 52%.  There were no wall motion abnormalities.  Currently he is without chest pain and is walking at least a mile and a half daily.  He feels significantly improved with his almost 50 pound weight loss.  He is bradycardic, but remains asymptomatic.  Last year his metoprolol was reduced to 12.5 mg twice a day.  He continues to be on Plavix and is without bleeding.  I reviewed recent blood work from June 2017.  Hemoglobin A1c was 6.0 compatible with metabolic syndrome.  His lipids are significantly improved with his weight reduction on Zetia 10 mg and atorvastatin 40 mg with recent cholesterol 116, triglycerides 78, HDL 49, and LDL 52.  He admits to 100% compliance with CPAP therapy.  However, his machine is starting to make significant noises.  We will contact his DME company to see if he qualifies for new CPAP machine and if so we will write a prescription for this.  If he does get a new CPAP machine.  I will need to see him in sleep clinic for follow-up evaluation.  Otherwise, I will see him in one year for reevaluation.    Time spent: 25 minutes Troy Sine, MD, [facility]  08/22/2016 7:35 AM

## 2016-09-13 ENCOUNTER — Ambulatory Visit: Payer: Medicare Other

## 2016-10-09 ENCOUNTER — Telehealth: Payer: Self-pay | Admitting: Family Medicine

## 2016-10-09 NOTE — Telephone Encounter (Signed)
Pt declined to get a AWV. Thank you! °

## 2016-10-18 ENCOUNTER — Encounter: Payer: Self-pay | Admitting: Family Medicine

## 2016-10-18 DIAGNOSIS — Z08 Encounter for follow-up examination after completed treatment for malignant neoplasm: Secondary | ICD-10-CM | POA: Diagnosis not present

## 2016-10-18 DIAGNOSIS — L821 Other seborrheic keratosis: Secondary | ICD-10-CM | POA: Diagnosis not present

## 2016-10-18 DIAGNOSIS — Z85828 Personal history of other malignant neoplasm of skin: Secondary | ICD-10-CM | POA: Diagnosis not present

## 2016-10-18 MED ORDER — PANTOPRAZOLE SODIUM 40 MG PO TBEC: DELAYED_RELEASE_TABLET | ORAL | 1 refills | Status: DC

## 2016-10-19 ENCOUNTER — Other Ambulatory Visit: Payer: Self-pay

## 2016-12-17 ENCOUNTER — Encounter: Payer: Self-pay | Admitting: Cardiovascular Disease

## 2017-01-01 ENCOUNTER — Telehealth: Payer: Self-pay | Admitting: Cardiovascular Disease

## 2017-01-01 NOTE — Telephone Encounter (Signed)
New message     *STAT* If patient is at the pharmacy, call can be transferred to refill team.   1. Which medications need to be refilled? (please list name of each medication and dose if known) Viagra 100mg - Chantix  2. Which pharmacy/location (including street and city if local pharmacy) is medication to be sent to[facility]   3. Do they need a 30 day or 90 day supply? 30 day    Pt calling stating he needs new scripts for these medications

## 2017-01-01 NOTE — Telephone Encounter (Signed)
Called patient and made aware we awaiting verification from Dr. Claiborne Billings before refilling prescriptions.    Pt aware and verbalized understanding.

## 2017-01-13 NOTE — Telephone Encounter (Signed)
Okay to renew

## 2017-01-14 ENCOUNTER — Telehealth: Payer: Self-pay | Admitting: Family Medicine

## 2017-01-14 NOTE — Telephone Encounter (Signed)
Pt declined AWV. °

## 2017-01-15 MED ORDER — SILDENAFIL CITRATE 100 MG PO TABS: 100.0000 mg | ORAL_TABLET | ORAL | 3 refills | Status: DC | PRN

## 2017-01-15 NOTE — Telephone Encounter (Signed)
Rx sent to pharmacy electronically.   

## 2017-03-28 ENCOUNTER — Telehealth: Payer: Self-pay | Admitting: Family Medicine

## 2017-03-28 NOTE — Telephone Encounter (Signed)
Pt declined AWV. °

## 2017-04-17 ENCOUNTER — Other Ambulatory Visit: Payer: Self-pay | Admitting: Family Medicine

## 2017-05-02 DIAGNOSIS — C44629 Squamous cell carcinoma of skin of left upper limb, including shoulder: Secondary | ICD-10-CM | POA: Diagnosis not present

## 2017-05-02 DIAGNOSIS — D485 Neoplasm of uncertain behavior of skin: Secondary | ICD-10-CM | POA: Diagnosis not present

## 2017-05-03 ENCOUNTER — Encounter: Payer: Self-pay | Admitting: Family Medicine

## 2017-05-03 ENCOUNTER — Ambulatory Visit (INDEPENDENT_AMBULATORY_CARE_PROVIDER_SITE_OTHER): Payer: Medicare Other | Admitting: Family Medicine

## 2017-05-03 VITALS — BP 130/68 | HR 49 | Temp 98.0°F | Resp 12 | Ht 71.0 in | Wt 212.6 lb

## 2017-05-03 DIAGNOSIS — Z130 Encounter for screening for diseases of the blood and blood-forming organs and certain disorders involving the immune mechanism: Secondary | ICD-10-CM | POA: Diagnosis not present

## 2017-05-03 DIAGNOSIS — I10 Essential (primary) hypertension: Secondary | ICD-10-CM

## 2017-05-03 DIAGNOSIS — I2510 Atherosclerotic heart disease of native coronary artery without angina pectoris: Secondary | ICD-10-CM

## 2017-05-03 DIAGNOSIS — D696 Thrombocytopenia, unspecified: Secondary | ICD-10-CM

## 2017-05-03 DIAGNOSIS — E785 Hyperlipidemia, unspecified: Secondary | ICD-10-CM | POA: Diagnosis not present

## 2017-05-03 DIAGNOSIS — R7303 Prediabetes: Secondary | ICD-10-CM | POA: Diagnosis not present

## 2017-05-03 DIAGNOSIS — G4733 Obstructive sleep apnea (adult) (pediatric): Secondary | ICD-10-CM | POA: Diagnosis not present

## 2017-05-03 LAB — LIPID PANEL
Cholesterol: 122 mg/dL (ref 0–200)
HDL: 54.7 mg/dL (ref >39.00)
LDL Cholesterol: 52 mg/dL (ref 0–99)
NonHDL: 66.8
Total CHOL/HDL Ratio: 2
Triglycerides: 75 mg/dL (ref 0.0–149.0)
VLDL: 15 mg/dL (ref 0.0–40.0)

## 2017-05-03 LAB — COMPREHENSIVE METABOLIC PANEL
ALT: 18 U/L (ref 0–53)
AST: 17 U/L (ref 0–37)
Albumin: 4.4 g/dL (ref 3.5–5.2)
Alkaline Phosphatase: 86 U/L (ref 39–117)
BUN: 16 mg/dL (ref 6–23)
CO2: 27 mEq/L (ref 19–32)
Calcium: 9.5 mg/dL (ref 8.4–10.5)
Chloride: 107 mEq/L (ref 96–112)
Creatinine, Ser: 1.02 mg/dL (ref 0.40–1.50)
GFR: 77.37 mL/min (ref >60.00)
Glucose, Bld: 100 mg/dL — ABNORMAL HIGH (ref 70–99)
Potassium: 4.3 mEq/L (ref 3.5–5.1)
Sodium: 141 mEq/L (ref 135–145)
Total Bilirubin: 0.7 mg/dL (ref 0.2–1.2)
Total Protein: 6.6 g/dL (ref 6.0–8.3)

## 2017-05-03 LAB — CBC
HCT: 41 % (ref 39.0–52.0)
Hemoglobin: 14 g/dL (ref 13.0–17.0)
MCHC: 34.1 g/dL (ref 30.0–36.0)
MCV: 94.4 fl (ref 78.0–100.0)
Platelets: 112 10*3/uL — ABNORMAL LOW (ref 150.0–400.0)
RBC: 4.34 Mil/uL (ref 4.22–5.81)
RDW: 14.5 % (ref 11.5–15.5)
WBC: 8.3 10*3/uL (ref 4.0–10.5)

## 2017-05-03 LAB — HEMOGLOBIN A1C: Hgb A1c MFr Bld: 6 % (ref 4.6–6.5)

## 2017-05-03 NOTE — Progress Notes (Signed)
Pre-visit discussion using our clinic review tool. No additional management support is needed unless otherwise documented below in the visit note.  

## 2017-05-03 NOTE — Assessment & Plan Note (Signed)
At goal. Continue benazepril and metoprolol.

## 2017-05-03 NOTE — Progress Notes (Signed)
Subjective:  Patient ID: Christian Cole, male    DOB: 10-Jan-1951  Age: 66 y.o. MRN: [id number]  CC: Follow up  HPI:  66 year old male with CAD status post CABG, hypertension, OSA, hyperlipidemia presents for follow-up.  CAD  Currently doing well. Asymptomatic. No chest pain or shortness of breath.  Tolerating Benazepril, Plavix, Lipitor, Zetia, and metoprolol without difficulty.  He is bradycardic but he is asymptomatic and doing well.  Hypertension  At goal on metoprolol and benazepril.   Hyperlipidemia  At goal on lipitor and zetia.  Obstructive sleep apnea   Stable on CPAP.  Social Hx   Social History   Social History  . Marital status: Married    Spouse name: N/A  . Number of children: 3  . Years of education: 60   Occupational History  . Retired Corporate treasurer   . Management/Technical [place]   Social History Main Topics  . Smoking status: Former Smoker    Years: 40.00    Types: Cigarettes    Quit date: 12/02/2014  . Smokeless tobacco: Never Used  . Alcohol use 6.0 oz/week    10 Cans of beer per week     Comment: Down to 2 beers a day from 4-6 a day   . Drug use: No  . Sexual activity: Yes    Partners: Female     Comment: Wife   Other Topics Concern  . Not on file   Social History Narrative   Christian Cole grew up in [place]. He is currently living in [place] with his wife. This is his second marriage. He has 1 daughter and 2 sons from his first marriage. His daughter lives in [place], one son in [place], [place] and the other son in [place], [place]. 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 He enjoys wood working on his spare time. 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     Review of Systems  Respiratory: Negative.   Cardiovascular: Negative.     Objective:  BP 130/68 (BP Location: Left Arm, Patient Position: Sitting, Cuff Size: Normal)   Pulse (!) 49   Temp 98 F (36.7 C) (Oral)   Resp 12   Ht 5\' 11"  (1.803 m)   Wt 212 lb 9.6 oz (96.4 kg)   SpO2 98%   BMI 29.65 kg/m   BP/Weight 05/03/2017 08/21/2016 4/31/5400  Systolic BP 867 619 509  Diastolic BP 68 86 62  Wt. (Lbs) 212.6 201.2 210.13  BMI 29.65 28.06 29.32   Physical Exam  Constitutional: He is oriented to person, place, and time. He appears well-developed. No distress.  Cardiovascular: Regular rhythm.   Bradycardic.  Pulmonary/Chest: Effort normal. He has no wheezes. He has no rales.  Neurological: He is alert and oriented to person, place, and time.  Psychiatric: He has a normal mood and affect.  Vitals reviewed.   Lab Results  Component Value Date   WBC 8.0 05/03/2016   HGB 13.0 05/03/2016   HCT 39.3 05/03/2016   PLT 110.0 (L) 05/03/2016   GLUCOSE 119 (H) 05/03/2016   CHOL 116 05/03/2016   TRIG 78.0 05/03/2016   HDL 49.10 05/03/2016   LDLDIRECT 91.6 01/06/2014   LDLCALC 52 05/03/2016   ALT 20 05/03/2016   AST 18 05/03/2016   NA 140  05/03/2016   K 4.4 05/03/2016   CL 108 05/03/2016   CREATININE 1.07 05/03/2016   BUN 19 05/03/2016   CO2 29 05/03/2016   TSH 1.11 01/06/2014   PSA 0.69 01/06/2014   HGBA1C 6.0 05/03/2016    Assessment & Plan:   Problem List Items Addressed This Visit      Cardiovascular and Mediastinum   CAD (coronary artery disease) - Primary    Stable currently. Continue current medications: Benazepril, Plavix, Lipitor, Zetia, metoprolol      HTN (hypertension)    At goal. Continue benazepril and metoprolol.      Relevant Orders   Comprehensive metabolic panel     Respiratory   OSA (obstructive sleep apnea)    Stable on CPAP. Continue.        Other   HLD (hyperlipidemia)    At goal on Lipitor and Zetia. Continue. Labs today.      Relevant Orders   Lipid panel    Other Visit Diagnoses    Prediabetes        Relevant Orders   Hemoglobin A1c   Thrombocytopenia ([place])       Relevant Orders   CBC     Follow-up: Annually  [place]

## 2017-05-03 NOTE — Patient Instructions (Signed)
We will call with your lab results.  Continue your meds.  Follow up annually.  Take care  Dr. Lacinda Axon

## 2017-05-03 NOTE — Assessment & Plan Note (Signed)
Stable currently. Continue current medications: Benazepril, Plavix, Lipitor, Zetia, metoprolol

## 2017-05-03 NOTE — Assessment & Plan Note (Signed)
At goal on Lipitor and Zetia. Continue. Labs today.

## 2017-05-03 NOTE — Assessment & Plan Note (Signed)
Stable on CPAP. Continue.

## 2017-05-07 ENCOUNTER — Telehealth: Payer: Self-pay | Admitting: Family Medicine

## 2017-05-07 NOTE — Telephone Encounter (Signed)
Patient advised of lab results dated 05/03/17.

## 2017-05-07 NOTE — Telephone Encounter (Signed)
Pt called back returning your call. Please advise, thank you!  Call pt @ [phone]

## 2017-06-04 ENCOUNTER — Encounter: Payer: Self-pay | Admitting: Cardiovascular Disease

## 2017-06-19 DIAGNOSIS — L905 Scar conditions and fibrosis of skin: Secondary | ICD-10-CM | POA: Diagnosis not present

## 2017-06-19 DIAGNOSIS — C44629 Squamous cell carcinoma of skin of left upper limb, including shoulder: Secondary | ICD-10-CM | POA: Diagnosis not present

## 2017-06-20 ENCOUNTER — Telehealth: Payer: Self-pay | Admitting: Family Medicine

## 2017-06-20 NOTE — Telephone Encounter (Signed)
Pt declined AWV. °

## 2017-08-16 ENCOUNTER — Other Ambulatory Visit: Payer: Self-pay | Admitting: Cardiovascular Disease

## 2017-08-16 NOTE — Telephone Encounter (Signed)
Rx(s) sent to pharmacy electronically.  PA OV 08/26/17

## 2017-08-25 NOTE — Progress Notes (Signed)
Cardiology Office Note    Date:  08/26/2017   ID:  Christian, Cole [birth year], MRN [id number]  PCP:  Coral Spikes, DO  Cardiologist: Dr. Claiborne Billings  Chief Complaint  Patient presents with  . Follow-up    pt reports no complaints    History of Present Illness:    Christian Cole is a 66 y.o. male with past medical history of CAD (s/p CABG in 2000 with LIMA-LAD, RIMA-PDA, SVG-D1, and SVG-PLA, low-risk NST in 2016), HTN, HLD, OSA (on CPAP), and tobacco use who presents to the office today for annual evaluation.   He was last examined by Dr. Claiborne Billings in 08/2016 and reported doing well from a cardiac perspective at that time, denying any recent chest pain or dyspnea on exertion. Was exercising multiple times her week and had lost over 40 lbs since his last office visit. Reported 100% compliance with his CPAP. He was continued on his current medication regimen with plans for follow-up in one year.   In talking with the patient today, he reports overall doing very well since his last office visit. He is active at baseline and performs yardwork multiple times per week without any anginal symptoms. Reports he cut up a fallen tree last week and carried the logs with no chest pain or dyspnea on exertion at that time. He denies any recent orthopnea, PND, lower extremity edema, or palpitations.  Reports good compliance with CPAP. He does have a message on his CPAP that says the motor needs to be serviced.   He does check his blood pressure regularly at home and reports systolic readings are usually in the 140's to 170's. He also checks his heart rate which is always in the 40's to 50's by his report. He denies any associated lightheadedness, dizziness, or presyncope.   Reports he has resumed smoking 0.75-1.0 ppd and requests Chantix for smoking cessation as this worked well for him in the past.    Past Medical History:  Diagnosis Date  . CAD (coronary artery disease)    a. s/p CABG in 2000  with LIMA-LAD, RIMA-PDA, SVG-D1, and SVG-PLA b. low-risk NST in 2016  . ED (erectile dysfunction)   . GERD (gastroesophageal reflux disease)   . Hyperlipidemia   . Hypertension    Followed by Dr. Claiborne Billings  . OSA on CPAP   . Sinus bradycardia     Past Surgical History:  Procedure Laterality Date  . CATARACT EXTRACTION, BILATERAL  2010  . CORONARY ARTERY BYPASS GRAFT  2000   LIMA to the LAD,RIMA to the PDA,vein graft to the diagonal & vein graft to the PLA  . KNEE DEBRIDEMENT  1989   Fluid flush  . SHOULDER SURGERY  2010   bone spur   . US ECHOCARDIOGRAPHY  12/12/2010   mild LA dilatation, normal LV systolic fx, EF > 31%    Current Medications: Outpatient Medications Prior to Visit  Medication Sig Dispense Refill  . pantoprazole (PROTONIX) 40 MG tablet take 1 tablet by mouth 1 hour before A MEAL 90 tablet 1  . atorvastatin (LIPITOR) 40 MG tablet Take 1 tablet (40 mg total) by mouth daily at 6 PM. 90 tablet 0  . benazepril (LOTENSIN) 40 MG tablet Take 1 tablet (40 mg total) by mouth daily. 90 tablet 0  . clopidogrel (PLAVIX) 75 MG tablet Take 1 tablet (75 mg total) by mouth daily. 90 tablet 0  . ezetimibe (ZETIA) 10 MG tablet Take 1 tablet (10 mg total)  by mouth daily. 90 tablet 3  . metoprolol tartrate (LOPRESSOR) 25 MG tablet Take 0.5 tablets (12.5 mg total) by mouth 2 (two) times daily. 180 tablet 3  . sildenafil (VIAGRA) 100 MG tablet Take 1 tablet (100 mg total) by mouth as needed for erectile dysfunction. 30 tablet 3   No facility-administered medications prior to visit.      Allergies:   Patient has no known allergies.   Social History   Social History  . Marital status: Married    Spouse name: N/A  . Number of children: 3  . Years of education: 15   Occupational History  . Retired Corporate treasurer   . Management/Technical [place]   Social History Main Topics  . Smoking status: Former Smoker    Years: 40.00    Types: Cigarettes    Quit date: 12/02/2014    . Smokeless tobacco: Never Used  . Alcohol use 6.0 oz/week    10 Cans of beer per week     Comment: Down to 2 beers a day from 4-6 a day   . Drug use: No  . Sexual activity: Yes    Partners: Female     Comment: Wife   Other Topics Concern  . None   Social History Narrative   Christian Cole grew up in [place]. He is currently living in [place] with his wife. This is his second marriage. He has 1 daughter and 2 sons from his first marriage. His daughter lives in [place], one son in [place], [place] and the other son in [place], [place]. 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 He enjoys wood working on his spare time. 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      Family History:  The patient's family history includes Arthritis in his maternal grandmother and mother; Cancer (age of onset: 53) in his father; Heart disease in his father; Hypertension in his father; Stroke in his father.   Review of Systems:   Please see the history of present illness.     General:  No chills, fever, night sweats or weight changes.  Cardiovascular:  No chest pain, dyspnea on exertion, edema, orthopnea, palpitations, paroxysmal nocturnal dyspnea. Dermatological: No rash, lesions/masses Respiratory: No cough, dyspnea Urologic: No hematuria, dysuria Abdominal:   No nausea, vomiting, diarrhea, bright red blood per rectum, melena, or hematemesis Neurologic:  No visual changes, wkns, changes in mental status.  He denies any of the above symptoms.    All other systems reviewed and are otherwise negative except as noted above.   Physical Exam:    VS:  BP (!) 152/84   Pulse (!) 40   Ht 5\' 11"  (1.803 m)   Wt 216 lb 6.4 oz (98.2 kg)   BMI 30.18 kg/m    General: Well developed, well nourished Caucasian male appearing in no  acute distress. Head: Normocephalic, atraumatic, sclera non-icteric, no xanthomas, nares are without discharge.  Neck: No carotid bruits. JVD not elevated.  Lungs: Respirations regular and unlabored, without wheezes or rales.  Heart: Regular rhythm, bradycardiac rate. No S3 or S4.  No murmur, no rubs, or gallops appreciated. Abdomen: Soft, non-tender, non-distended with normoactive bowel sounds. No hepatomegaly. No rebound/guarding. No obvious abdominal masses. Msk:  Strength and tone appear normal for age. No joint  deformities or effusions. Extremities: No clubbing or cyanosis. No lower extremity edema.  Distal pedal pulses are 2+ bilaterally. Neuro: Alert and oriented X 3. Moves all extremities spontaneously. No focal deficits noted. Psych:  Responds to questions appropriately with a normal affect. Skin: No rashes or lesions noted  Wt Readings from Last 3 Encounters:  08/26/17 216 lb 6.4 oz (98.2 kg)  05/03/17 212 lb 9.6 oz (96.4 kg)  08/21/16 201 lb 3.2 oz (91.3 kg)     Studies/Labs Reviewed:   EKG:  EKG is ordered today.  The ekg ordered today demonstrates sinus bradycardia, HR 40, with 1st degree AV Block. No acute ST or T-wave changes.   Recent Labs: 05/03/2017: ALT 18; BUN 16; Creatinine, Ser 1.02; Hemoglobin 14.0; Platelets 112.0; Potassium 4.3; Sodium 141   Lipid Panel    Component Value Date/Time   CHOL 122 05/03/2017 1056   TRIG 75.0 05/03/2017 1056   HDL 54.70 05/03/2017 1056   CHOLHDL 2 05/03/2017 1056   VLDL 15.0 05/03/2017 1056   LDLCALC 52 05/03/2017 1056   LDLDIRECT 91.6 01/06/2014 1443    Additional studies/ records that were reviewed today include:   NST: 04/2015  Baseline ECG indicates non-specific ST-T wave changes. Horizontal ST segment depression ST segment depression of 3 mm was noted during stress in the inferolateral leads (II, III, aVF, V5 and V6), beginning at 1 minutes of stress, and returning to baseline after 5-9 minutes of recovery.  The  patient reported shortness of breath and fatigue during the stress test. Bilateral calf tightness/cramping The patient experienced no angina during the stress test  D[facility] Treadmill Score: intermediate risk  Myocardial perfusion is normal. This is a low risk study. Overall left ventricular systolic function was normal. LV cavity size is normal. The left ventricular ejection fraction is mildly decreased (52%). There is no prior study for comparison.   Markedly abnormal EKG tracings with exercise suggestive of ischemia, however, nuclear perfusion images are normal. Good exercise tolerance and no chest pain. This may be a repolarization abnormality. No evidence for reversible ischemia.  Assessment:    1. Coronary artery disease involving native coronary artery of native heart without angina pectoris   2. Essential hypertension   3. Hyperlipidemia LDL goal <70   4. OSA (obstructive sleep apnea)   5. Sinus bradycardia   6. Tobacco use   7. Erectile dysfunction, unspecified erectile dysfunction type      Plan:   In order of problems listed above:  1. CAD - s/p CABG in 2000 with LIMA-LAD, RIMA-PDA, SVG-D1, and SVG-PLA. Most recent ischemic evaluation was a low-risk NST in 2016.  - he denies any recent chest pain or dyspnea on exertion. Is active at baseline and denies any recent anginal symptoms. - continue Plavix, statin, Zetia, and BB therapy. Smoking cessation advised and Rx for Chantix provided as outlined below.   2. HTN - he reports systolic readings are usually in the 140's to 170's when checked at home. Elevated to 160/78 initially during today's visit. At 152/86 on recheck.  - he is currently on Benazepril 40mg  daily and Lopressor 12.5mg  BID. Will add Amlodipine 5mg  daily due to elevated readings with goal < 130/80.  3. HLD - Lipid Panel in 04/2017 showed total cholesterol of 122, HDL 54, and LDL 52. At goal of LDL < 70. - continue Atorvastatin 40mg  daily.   4. OSA - continue  CPAP.  - was encouraged to follow-up with the company he received his CPAP from in  regards to routine maintenance of the equipment.   5. Sinus Bradycardia - HR is always in the 40's per his report. Initially at 55 today and 56 on recheck. - he remains on Lopressor 12.5mg  BID. We discussed stopping this but he wishes to remain on it due to being asymptomatic with the low HR. He will monitor HR along with BP at home and let us know if this declines.   6. Tobacco Use - he has resumed smoking 0.75 - 1.0 ppd, having successfully stopped in the past with Chantix.  - he requests an Rx for Chantix and will write for this today.   7. Erectile Dysfunction - he requests a refill of Viagra today. Will write for Rx. Instructed on avoiding with concurrent use of NTG.     Medication Adjustments/Labs and Tests Ordered: Current medicines are reviewed at length with the patient today.  Concerns regarding medicines are outlined above.  Medication changes, Labs and Tests ordered today are listed in the Patient Instructions below. Patient Instructions  Bernerd Pho, [place] has recommended making the following medication changes: 1. START Amlodipine 5 mg daily 2. START Chantix as directed  Your physician recommends that you schedule a follow-up appointment in 12 months with Dr Claiborne Billings. You will receive a reminder letter in the mail two months in advance. If you don't receive a letter, please call our office to schedule the follow-up appointment.  If you need a refill on your cardiac medications before your next appointment, please call your pharmacy.    Signed, Erma Heritage, PA-C  08/26/2017 3:50 PM    [facility] [place], [place], [place]  [address] Phone: [phone]; Fax: [phone]  [address] [place], [place] [address] Phone: [phone]

## 2017-08-26 ENCOUNTER — Ambulatory Visit (INDEPENDENT_AMBULATORY_CARE_PROVIDER_SITE_OTHER): Payer: Medicare Other | Admitting: Student

## 2017-08-26 ENCOUNTER — Encounter: Payer: Self-pay | Admitting: Student

## 2017-08-26 VITALS — BP 152/84 | HR 40 | Ht 71.0 in | Wt 216.4 lb

## 2017-08-26 DIAGNOSIS — N529 Male erectile dysfunction, unspecified: Secondary | ICD-10-CM | POA: Diagnosis not present

## 2017-08-26 DIAGNOSIS — G4733 Obstructive sleep apnea (adult) (pediatric): Secondary | ICD-10-CM

## 2017-08-26 DIAGNOSIS — R001 Bradycardia, unspecified: Secondary | ICD-10-CM | POA: Diagnosis not present

## 2017-08-26 DIAGNOSIS — E785 Hyperlipidemia, unspecified: Secondary | ICD-10-CM | POA: Diagnosis not present

## 2017-08-26 DIAGNOSIS — I2510 Atherosclerotic heart disease of native coronary artery without angina pectoris: Secondary | ICD-10-CM

## 2017-08-26 DIAGNOSIS — I10 Essential (primary) hypertension: Secondary | ICD-10-CM

## 2017-08-26 DIAGNOSIS — Z720 Tobacco use: Secondary | ICD-10-CM

## 2017-08-26 MED ORDER — BENAZEPRIL HCL 40 MG PO TABS: 40.0000 mg | ORAL_TABLET | Freq: Every day | ORAL | 3 refills | Status: DC

## 2017-08-26 MED ORDER — AMLODIPINE BESYLATE 5 MG PO TABS: 5.0000 mg | ORAL_TABLET | Freq: Every day | ORAL | 3 refills | Status: DC

## 2017-08-26 MED ORDER — VARENICLINE TARTRATE 1 MG PO TABS: 1.0000 mg | ORAL_TABLET | Freq: Two times a day (BID) | ORAL | 2 refills | Status: DC

## 2017-08-26 MED ORDER — VARENICLINE TARTRATE 0.5 MG X 11 & 1 MG X 42 PO MISC: ORAL | 0 refills | Status: DC

## 2017-08-26 MED ORDER — EZETIMIBE 10 MG PO TABS: 10.0000 mg | ORAL_TABLET | Freq: Every day | ORAL | 3 refills | Status: DC

## 2017-08-26 MED ORDER — ATORVASTATIN CALCIUM 40 MG PO TABS: 40.0000 mg | ORAL_TABLET | Freq: Every day | ORAL | 3 refills | Status: DC

## 2017-08-26 MED ORDER — SILDENAFIL CITRATE 100 MG PO TABS: 100.0000 mg | ORAL_TABLET | ORAL | 3 refills | Status: DC | PRN

## 2017-08-26 MED ORDER — CLOPIDOGREL BISULFATE 75 MG PO TABS
75.0000 mg | ORAL_TABLET | Freq: Every day | ORAL | 3 refills | Status: DC
Start: 2017-08-26 — End: 2018-10-21

## 2017-08-26 MED ORDER — METOPROLOL TARTRATE 25 MG PO TABS: 12.5000 mg | ORAL_TABLET | Freq: Two times a day (BID) | ORAL | 3 refills | Status: DC

## 2017-08-26 NOTE — Patient Instructions (Signed)
Bernerd Pho, [place] has recommended making the following medication changes: 1. START Amlodipine 5 mg daily 2. START Chantix as directed  Your physician recommends that you schedule a follow-up appointment in 12 months with Dr Claiborne Billings. You will receive a reminder letter in the mail two months in advance. If you don't receive a letter, please call our office to schedule the follow-up appointment.  If you need a refill on your cardiac medications before your next appointment, please call your pharmacy.

## 2017-09-27 ENCOUNTER — Ambulatory Visit: Payer: Self-pay | Admitting: Cardiovascular Disease

## 2017-10-16 ENCOUNTER — Other Ambulatory Visit: Payer: Self-pay | Admitting: Family Medicine

## 2017-11-04 NOTE — Telephone Encounter (Signed)
Refilled: 04/17/2017 Last OV: 05/03/2017 Next OV: 12/03/2017 with Dr. Aundra Dubin

## 2017-12-03 ENCOUNTER — Ambulatory Visit (INDEPENDENT_AMBULATORY_CARE_PROVIDER_SITE_OTHER): Payer: Medicare Other | Admitting: Internal Medicine

## 2017-12-03 ENCOUNTER — Encounter: Payer: Self-pay | Admitting: Internal Medicine

## 2017-12-03 VITALS — BP 132/66 | HR 61 | Temp 98.1°F | Ht 71.0 in | Wt 219.2 lb

## 2017-12-03 DIAGNOSIS — Z1159 Encounter for screening for other viral diseases: Secondary | ICD-10-CM

## 2017-12-03 DIAGNOSIS — Z13818 Encounter for screening for other digestive system disorders: Secondary | ICD-10-CM

## 2017-12-03 DIAGNOSIS — D696 Thrombocytopenia, unspecified: Secondary | ICD-10-CM

## 2017-12-03 DIAGNOSIS — F17210 Nicotine dependence, cigarettes, uncomplicated: Secondary | ICD-10-CM

## 2017-12-03 DIAGNOSIS — G4733 Obstructive sleep apnea (adult) (pediatric): Secondary | ICD-10-CM | POA: Diagnosis not present

## 2017-12-03 DIAGNOSIS — Z1329 Encounter for screening for other suspected endocrine disorder: Secondary | ICD-10-CM

## 2017-12-03 DIAGNOSIS — I10 Essential (primary) hypertension: Secondary | ICD-10-CM

## 2017-12-03 DIAGNOSIS — Z122 Encounter for screening for malignant neoplasm of respiratory organs: Secondary | ICD-10-CM | POA: Diagnosis not present

## 2017-12-03 DIAGNOSIS — Z23 Encounter for immunization: Secondary | ICD-10-CM

## 2017-12-03 DIAGNOSIS — K219 Gastro-esophageal reflux disease without esophagitis: Secondary | ICD-10-CM | POA: Diagnosis not present

## 2017-12-03 DIAGNOSIS — E785 Hyperlipidemia, unspecified: Secondary | ICD-10-CM

## 2017-12-03 DIAGNOSIS — Z125 Encounter for screening for malignant neoplasm of prostate: Secondary | ICD-10-CM

## 2017-12-03 DIAGNOSIS — Z9989 Dependence on other enabling machines and devices: Secondary | ICD-10-CM

## 2017-12-03 DIAGNOSIS — Z720 Tobacco use: Secondary | ICD-10-CM

## 2017-12-03 DIAGNOSIS — I2510 Atherosclerotic heart disease of native coronary artery without angina pectoris: Secondary | ICD-10-CM

## 2017-12-03 LAB — URINALYSIS, ROUTINE W REFLEX MICROSCOPIC
Bilirubin Urine: NEGATIVE
Hgb urine dipstick: NEGATIVE
Ketones, ur: NEGATIVE
Leukocytes, UA: NEGATIVE
Nitrite: NEGATIVE
RBC / HPF: NONE SEEN (ref 0–?)
Specific Gravity, Urine: 1.01 (ref 1.000–1.030)
Total Protein, Urine: NEGATIVE
Urine Glucose: NEGATIVE
Urobilinogen, UA: 0.2 (ref 0.0–1.0)
pH: 6.5 (ref 5.0–8.0)

## 2017-12-03 LAB — PSA, MEDICARE: PSA: 0.96 ng/ml (ref 0.10–4.00)

## 2017-12-03 LAB — TSH: TSH: 1.67 u[IU]/mL (ref 0.35–4.50)

## 2017-12-03 NOTE — Patient Instructions (Addendum)
Please follow up in 1-2 months  Will refer to [facility] for repeat sleep study to get new machine Avoid Ibuprofen   Thrombocytopenia Thrombocytopenia is a condition in which you have an abnormally decreased number of platelets in your blood. Platelets are also called thrombocytes. Platelets are needed for blood clotting. Some cases of thrombocytopenia are mild while others are more severe. What are the causes? This condition may be caused by:  Decreased production of platelets. This can be caused by: ? Aplastic anemia, in which your bone marrow quits making blood cells. ? Cancer in the bone marrow. ? Use of certain medicines, including chemotherapy. ? Infection in the bone marrow. ? Heavy alcohol consumption.  Increased destruction of platelets. This can be caused by: ? Certain immune diseases. ? Use of certain drugs. ? Certain blood clotting disorders. ? Certain inherited disorders. ? Certain bleeding disorders. ? Pregnancy.  Having an enlarged spleen (hypersplenism). In hypersplenism, the spleen gathers up platelets from circulation. This means that the platelets are not available to help with blood clotting. The spleen can be enlarged because of cirrhosis or other conditions.  What are the signs or symptoms? Symptoms of this condition are side effects of poor blood clotting. They will vary depending on how low the platelet counts are. Symptoms may include:  Abnormal bleeding.  Nosebleeds.  Heavy menstrual periods.  Blood in the urine or stool (feces).  A purplish discoloration in the skin (purpura).  Bruising.  A rash that looks like pinpoint, purplish-red spots (petechiae) on the skin and mucous membranes.  How is this diagnosed? This condition may be diagnosed with blood tests and a physical exam. Sometimes, a sample of bone marrow may be removed to look for the original cells (megakaryocytes) that make platelets. How is this treated? Treatment for this  condition depends on the cause. Treatment options may include:  Treatment of another condition that is causing the low platelet count.  Medicines to help protect your platelets from being destroyed.  A replacement (transfusion) of platelets to stop or prevent bleeding.  Surgery to remove the spleen.  Follow these instructions at home: General instructions  Check your skin and the linings inside your mouth for bruising or bleeding as told by your health care provider.  Check your sputum, urine, and stool for blood as told by your health care provider.  Ask your health care provider if it is okay for you to drink alcohol.  Take over-the-counter and prescription medicines only as told by your health care provider.  Tell all of your health care providers, including dentists and eye doctors, about your condition. Activity  Until your health care provider says it is okay. ? Do not return to any activities that could cause bumps or bruises.  Take extra care not to cut yourself when you shave or when you use scissors, needles, knives, and other tools.  Take extra care not to burn yourself when ironing or cooking. Contact a health care provider if:  You have unexplained bruising. Get help right away if:  You have active bleeding from anywhere on your body.  You have blood in your sputum, urine, or stool. This information is not intended to replace advice given to you by your health care provider. Make sure you discuss any questions you have with your health care provider. Document Released: 10/29/2005 Document Revised: 07/01/2016 Document Reviewed: 05/02/2015 Elsevier Interactive Patient Education  2018 [employer].

## 2017-12-04 ENCOUNTER — Telehealth: Payer: Self-pay | Admitting: *Deleted

## 2017-12-04 ENCOUNTER — Encounter: Payer: Self-pay | Admitting: Internal Medicine

## 2017-12-04 DIAGNOSIS — Z122 Encounter for screening for malignant neoplasm of respiratory organs: Secondary | ICD-10-CM

## 2017-12-04 DIAGNOSIS — Z87891 Personal history of nicotine dependence: Secondary | ICD-10-CM

## 2017-12-04 DIAGNOSIS — D696 Thrombocytopenia, unspecified: Secondary | ICD-10-CM | POA: Insufficient documentation

## 2017-12-04 DIAGNOSIS — Z85828 Personal history of other malignant neoplasm of skin: Secondary | ICD-10-CM | POA: Insufficient documentation

## 2017-12-04 DIAGNOSIS — Z87898 Personal history of other specified conditions: Secondary | ICD-10-CM | POA: Insufficient documentation

## 2017-12-04 LAB — CBC WITH DIFFERENTIAL/PLATELET
Basophils Absolute: 44 cells/uL (ref 0–200)
Basophils Relative: 0.5 %
Eosinophils Absolute: 114 cells/uL (ref 15–500)
Eosinophils Relative: 1.3 %
HCT: 45.9 % (ref 38.5–50.0)
Hemoglobin: 15.9 g/dL (ref 13.2–17.1)
Lymphs Abs: 3687 cells/uL (ref 850–3900)
MCH: 31 pg (ref 27.0–33.0)
MCHC: 34.6 g/dL (ref 32.0–36.0)
MCV: 89.5 fL (ref 80.0–100.0)
MPV: 11.2 fL (ref 7.5–12.5)
Monocytes Relative: 8.6 %
Neutro Abs: 4198 cells/uL (ref 1500–7800)
Neutrophils Relative %: 47.7 %
Platelets: 147 10*3/uL (ref 140–400)
RBC: 5.13 10*6/uL (ref 4.20–5.80)
RDW: 12.9 % (ref 11.0–15.0)
Total Lymphocyte: 41.9 %
WBC mixed population: 757 cells/uL (ref 200–950)
WBC: 8.8 10*3/uL (ref 3.8–10.8)

## 2017-12-04 LAB — HEPATITIS C ANTIBODY
Hepatitis C Ab: NONREACTIVE
SIGNAL TO CUT-OFF: 0.01 (ref <1.00)

## 2017-12-04 LAB — HEPATITIS B SURFACE ANTIBODY, QUANTITATIVE: Hepatitis B-Post: <5 m[IU]/mL — ABNORMAL LOW (ref >=10)

## 2017-12-04 LAB — HEPATITIS B SURFACE ANTIGEN: Hepatitis B Surface Ag: NONREACTIVE

## 2017-12-04 LAB — PATHOLOGIST SMEAR REVIEW

## 2017-12-04 MED ORDER — AMLODIPINE BESYLATE 5 MG PO TABS: 5.0000 mg | ORAL_TABLET | Freq: Every day | ORAL | 3 refills | Status: DC

## 2017-12-04 MED ORDER — PANTOPRAZOLE SODIUM 40 MG PO TBEC: DELAYED_RELEASE_TABLET | ORAL | 1 refills | Status: DC

## 2017-12-04 NOTE — Telephone Encounter (Signed)
Received referral for initial lung cancer screening scan. Contacted patient and obtained smoking history,(current, 44 pack year) as well as answering questions related to screening process. Patient denies signs of lung cancer such as weight loss or hemoptysis. Patient denies comorbidity that would prevent curative treatment if lung cancer were found. Patient is scheduled for shared decision making visit and CT scan on 12/31/17.

## 2017-12-04 NOTE — Progress Notes (Addendum)
Chief Complaint  Patient presents with  . Follow-up   Follow up  1.HTN controlled 132/66 on Norvasc 5 mg qd  2. OSA on cpap but machine alarming and needs new sleep study. Last sleep study 6 years ago with comorbidities HTN, heart disease, BMI 30.58, h/o snoring  3. H/o thrombocytopenia never had w/u in the past he does report NSAID use prn and also drinking 3 beers qhs  4. Tobacco abuse smoking x 40 years max 1 ppd down to 1/2 ppd since nov. Wife is smoker which is trigger for him to smoke when he has quit in the past.  He wanted to try Chantix but it was not approved, patches in the past have not worked, he cant chew gum due to partial but thinking about lozengers.    Review of Systems  Constitutional: Negative for weight loss.  HENT: Negative for hearing loss.   Eyes:       No vision changes   Respiratory: Negative for shortness of breath.   Cardiovascular: Negative for chest pain.  Gastrointestinal: Negative for abdominal pain.  Musculoskeletal: Negative for falls.  Skin: Negative for rash.  Neurological: Negative for headaches.  Psychiatric/Behavioral: Negative for memory loss.   Past Medical History:  Diagnosis Date  . CAD (coronary artery disease)    a. s/p CABG in 2000 with LIMA-LAD, RIMA-PDA, SVG-D1, and SVG-PLA b. low-risk NST in 2016  . ED (erectile dysfunction)   . GERD (gastroesophageal reflux disease)   . Hyperlipidemia   . Hypertension    Followed by Dr. Claiborne Billings  . OSA on CPAP   . Sinus bradycardia   . Thrombocytopenia ([place])    Past Surgical History:  Procedure Laterality Date  . CATARACT EXTRACTION, BILATERAL  2010  . CORONARY ARTERY BYPASS GRAFT  2000   LIMA to the LAD,RIMA to the PDA,vein graft to the diagonal & vein graft to the PLA  . KNEE DEBRIDEMENT  1989   Fluid flush  . SHOULDER SURGERY  2010   bone spur   . US ECHOCARDIOGRAPHY  12/12/2010   mild LA dilatation, normal LV systolic fx, EF > 17%   Family History  Problem Relation Age of Onset  .  Arthritis Mother   . Heart disease Father        CAD - MI  . Stroke Father   . Hypertension Father   . Cancer Father 40       esophageal cancer   . Arthritis Maternal Grandmother    Social History   Socioeconomic History  . Marital status: Married    Spouse name: Not on file  . Number of children: 3  . Years of education: 81  . Highest education level: Not on file  Social Needs  . Financial resource strain: Not on file  . Food insecurity - worry: Not on file  . Food insecurity - inability: Not on file  . Transportation needs - medical: Not on file  . Transportation needs - non-medical: Not on file  Occupational History  . Occupation: Retired Corporate treasurer  . Occupation: Management/Technical Support    Comment: Psychiatric nurse  Tobacco Use  . Smoking status: Current Every Day Smoker    Years: 40.00    Types: Cigarettes    Last attempt to quit: 12/02/2014    Years since quitting: 3.0  . Smokeless tobacco: Never Used  . Tobacco comment: on and off smoker   Substance and Sexual Activity  . Alcohol use: Yes    Alcohol/week: 6.0 oz  Types: 10 Cans of beer per week    Comment: Down to 3 beers a day from 4-6 a day   . Drug use: No  . Sexual activity: Yes    Partners: Female    Comment: Wife  Other Topics Concern  . Not on file  Social History Narrative   Benard grew up in [place]. He is currently living in [place] with his wife. This is his second marriage. He has 1 daughter and 2 sons from his first marriage. He has 4 step kids. His daughter lives in [place], one son in [place], [place] and the other son in [place], [place]. 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 He enjoys wood working on his spare time. 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       2 brothers and 2 sisters he is next to  youngest    Current Meds  Medication Sig  . amLODipine (NORVASC) 5 MG tablet Take 1 tablet (5 mg total) by mouth daily.  Marland Kitchen atorvastatin (LIPITOR) 40 MG tablet Take 1 tablet (40 mg total) by mouth daily at 6 PM.  . benazepril (LOTENSIN) 40 MG tablet Take 1 tablet (40 mg total) by mouth daily.  . clopidogrel (PLAVIX) 75 MG tablet Take 1 tablet (75 mg total) by mouth daily.  Marland Kitchen ezetimibe (ZETIA) 10 MG tablet Take 1 tablet (10 mg total) by mouth daily.  . metoprolol tartrate (LOPRESSOR) 25 MG tablet Take 0.5 tablets (12.5 mg total) by mouth 2 (two) times daily.  . pantoprazole (PROTONIX) 40 MG tablet take 1 tablet by mouth once daily 30 minutes BEFORE A MEAL  . sildenafil (VIAGRA) 100 MG tablet Take 1 tablet (100 mg total) by mouth as needed for erectile dysfunction.  . varenicline (CHANTIX CONTINUING MONTH PAK) 1 MG tablet Take 1 tablet (1 mg total) by mouth 2 (two) times daily.  . varenicline (CHANTIX STARTING MONTH PAK) 0.5 MG X 11 & 1 MG X 42 tablet Take one 0.5 mg tablet by mouth once daily for 3 days, then increase to one 0.5 mg tablet twice daily for 4 days, then increase to one 1 mg tablet twice daily.  . [DISCONTINUED] amLODipine (NORVASC) 5 MG tablet Take 1 tablet (5 mg total) by mouth daily.  . [DISCONTINUED] pantoprazole (PROTONIX) 40 MG tablet take 1 tablet by mouth once daily 1 HOUR BEFORE A MEAL   No Known Allergies Recent Results (from the past 2160 hour(s))  TSH     Status: None   Collection Time: 12/03/17  1:37 PM  Result Value Ref Range   TSH 1.67 0.35 - 4.50 uIU/mL  PSA, Medicare     Status: None   Collection Time: 12/03/17  1:37 PM  Result Value Ref Range   PSA 0.96 0.10 - 4.00 ng/ml    Comment: Test performed using Access Hybritech PSA Assay, a parmagnetic partical, chemiluminecent immunoassay.  Urinalysis, Routine w reflex microscopic     Status: None   Collection Time: 12/03/17  1:37 PM  Result Value Ref Range   Color, Urine YELLOW Yellow;Lt. Yellow   APPearance  CLEAR Clear   Specific Gravity, Urine 1.010 1.000 - 1.030   pH 6.5 5.0 - 8.0   Total Protein, Urine NEGATIVE Negative   Urine Glucose NEGATIVE Negative   Ketones, ur NEGATIVE Negative  Bilirubin Urine NEGATIVE Negative   Hgb urine dipstick NEGATIVE Negative   Urobilinogen, UA 0.2 0.0 - 1.0   Leukocytes, UA NEGATIVE Negative   Nitrite NEGATIVE Negative   WBC, UA 0-2/hpf 0-2/hpf   RBC / HPF none seen 0-2/hpf  CBC w/Diff     Status: None   Collection Time: 12/03/17  1:57 PM  Result Value Ref Range   WBC 8.8 3.8 - 10.8 Thousand/uL   RBC 5.13 4.20 - 5.80 Million/uL   Hemoglobin 15.9 13.2 - 17.1 g/dL   HCT 45.9 38.5 - 50.0 %   MCV 89.5 80.0 - 100.0 fL   MCH 31.0 27.0 - 33.0 pg   MCHC 34.6 32.0 - 36.0 g/dL   RDW 12.9 11.0 - 15.0 %   Platelets 147 140 - 400 Thousand/uL   MPV 11.2 7.5 - 12.5 fL   Neutro Abs 4,198 1,500 - 7,800 cells/uL   Lymphs Abs 3,687 850 - 3,900 cells/uL   WBC mixed population 757 200 - 950 cells/uL   Eosinophils Absolute 114 15 - 500 cells/uL   Basophils Absolute 44 0 - 200 cells/uL   Neutrophils Relative % 47.7 %   Total Lymphocyte 41.9 %   Monocytes Relative 8.6 %   Eosinophils Relative 1.3 %   Basophils Relative 0.5 %   Objective  Body mass index is 30.58 kg/m. Wt Readings from Last 3 Encounters:  12/03/17 219 lb 4 oz (99.5 kg)  08/26/17 216 lb 6.4 oz (98.2 kg)  05/03/17 212 lb 9.6 oz (96.4 kg)   Temp Readings from Last 3 Encounters:  12/03/17 98.1 F (36.7 C) (Oral)  05/03/17 98 F (36.7 C) (Oral)  05/03/16 98.1 F (36.7 C) (Oral)   BP Readings from Last 3 Encounters:  12/03/17 132/66  08/26/17 (!) 152/84  05/03/17 130/68   Pulse Readings from Last 3 Encounters:  12/03/17 61  08/26/17 (!) 40  05/03/17 (!) 49   O2 sat room air 94%   Physical Exam  Constitutional: He is oriented to person, place, and time and well-developed, well-nourished, and in no distress. Vital signs are normal.  HENT:  Head: Normocephalic and atraumatic.    Mouth/Throat: Oropharynx is clear and moist and mucous membranes are normal.    Eyes: Conjunctivae are normal. Pupils are equal, round, and reactive to light.  Cardiovascular: Normal rate, regular rhythm and normal heart sounds.  Pulmonary/Chest: Effort normal and breath sounds normal. He has no wheezes.  Abdominal: Soft. Bowel sounds are normal.  Neurological: He is alert and oriented to person, place, and time. Gait normal. Gait normal.  Skin: Skin is warm, dry and intact.  Psychiatric: Mood, memory, affect and judgment normal.  Nursing note and vitals reviewed.   Assessment   1. HTN/CAD s/p CABG  2. OSA on cpap needs new machine with comorbidities HTN, heart disease, BMI 30.58, h/o snoring  3. Thrombocytopenia ? Etiology see HPI he is taking NSAIDS and drinking 3 beers qhs 4. Tobacco abuse/dependence  5. HM 6. GERD  Plan   1. Cont norvasc 5 mg qd. Lotensin 40 mg qd, Metoprolol 12.5 mg bid  Repeat CMET, lipid by 05/03/18  He is not taking aspirin per cards only plavix  2.  Referred for sleep study needs new machine 3. Check CBC today, peripheral smear, screen hep B/C Order US abdomen to w/u r/o splenomegaly  4. rec smoking cessation  Counseled x 3 minutes today he is thinking about lozengers could not afford chantix  40 pk year smoker ordered  CT low dose chest screen lung [place]  5.  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 Refill protonix pt [facility]  Monitor tongue lesion  Provider: Dr. [name] [name]-Internal Medicine

## 2017-12-05 NOTE — Telephone Encounter (Signed)
error 

## 2017-12-10 ENCOUNTER — Ambulatory Visit: Payer: Medicare Other

## 2017-12-10 ENCOUNTER — Ambulatory Visit
Admission: RE | Admit: 2017-12-10 | Discharge: 2017-12-10 | Disposition: A | Discharge status: Home / Self Care | Payer: Medicare Other | Source: Ambulatory Visit | Attending: Internal Medicine | Admitting: Internal Medicine

## 2017-12-10 DIAGNOSIS — D696 Thrombocytopenia, unspecified: Secondary | ICD-10-CM | POA: Diagnosis not present

## 2017-12-10 DIAGNOSIS — N281 Cyst of kidney, acquired: Secondary | ICD-10-CM | POA: Diagnosis not present

## 2017-12-31 ENCOUNTER — Inpatient Hospital Stay: Payer: Medicare Other | Attending: Nurse Practitioner | Admitting: Nurse Practitioner

## 2017-12-31 ENCOUNTER — Encounter: Payer: Self-pay | Admitting: Nurse Practitioner

## 2017-12-31 ENCOUNTER — Ambulatory Visit
Admission: RE | Admit: 2017-12-31 | Discharge: 2017-12-31 | Disposition: A | Discharge status: Home / Self Care | Payer: Medicare Other | Source: Ambulatory Visit | Attending: Nurse Practitioner | Admitting: Nurse Practitioner

## 2017-12-31 DIAGNOSIS — R918 Other nonspecific abnormal finding of lung field: Secondary | ICD-10-CM | POA: Diagnosis not present

## 2017-12-31 DIAGNOSIS — I700 Atherosclerosis of aorta: Secondary | ICD-10-CM | POA: Insufficient documentation

## 2017-12-31 DIAGNOSIS — F17210 Nicotine dependence, cigarettes, uncomplicated: Secondary | ICD-10-CM

## 2017-12-31 DIAGNOSIS — Z87891 Personal history of nicotine dependence: Secondary | ICD-10-CM | POA: Insufficient documentation

## 2017-12-31 DIAGNOSIS — Z122 Encounter for screening for malignant neoplasm of respiratory organs: Secondary | ICD-10-CM

## 2018-01-02 NOTE — Progress Notes (Signed)
In accordance with CMS guidelines, patient has met eligibility criteria including age, absence of signs or symptoms of lung cancer.  Social History   Tobacco Use  . Smoking status: Current Every Day Smoker    Packs/day: 1.00    Years: 44.00    Pack years: 44.00    Types: Cigarettes  . Smokeless tobacco: Never Used  . Tobacco comment: on and off smoker   Substance Use Topics  . Alcohol use: Yes    Alcohol/week: 6.0 oz    Types: 10 Cans of beer per week    Comment: Down to 3 beers a day from 4-6 a day   . Drug use: No     A shared decision-making session was conducted prior to the performance of CT scan. This includes one or more decision aids, includes benefits and harms of screening, follow-up diagnostic testing, over-diagnosis, false positive rate, and total radiation exposure.  Counseling on the importance of adherence to annual lung cancer LDCT screening, impact of co-morbidities, and ability or willingness to undergo diagnosis and treatment is imperative for compliance of the program.  Counseling on the importance of continued smoking cessation for former smokers; the importance of smoking cessation for current smokers, and information about tobacco cessation interventions have been given to patient including [place] and 1800 quit [place] programs.  Written order for lung cancer screening with LDCT has been given to the patient and any and all questions have been answered to the best of my abilities.   Yearly follow up will be coordinated by Burgess Estelle, Thoracic Navigator.  Beckey Rutter, DNP, AGNP-C [place] at [facility] [phone] (office) 01/02/18 12:55 PM

## 2018-01-03 ENCOUNTER — Encounter: Payer: Self-pay | Admitting: *Deleted

## 2018-01-13 DIAGNOSIS — I259 Chronic ischemic heart disease, unspecified: Secondary | ICD-10-CM | POA: Diagnosis not present

## 2018-01-13 DIAGNOSIS — E663 Overweight: Secondary | ICD-10-CM | POA: Diagnosis not present

## 2018-01-13 DIAGNOSIS — G4733 Obstructive sleep apnea (adult) (pediatric): Secondary | ICD-10-CM | POA: Diagnosis not present

## 2018-01-13 DIAGNOSIS — I10 Essential (primary) hypertension: Secondary | ICD-10-CM | POA: Diagnosis not present

## 2018-01-24 DIAGNOSIS — Z08 Encounter for follow-up examination after completed treatment for malignant neoplasm: Secondary | ICD-10-CM | POA: Diagnosis not present

## 2018-01-24 DIAGNOSIS — Z85828 Personal history of other malignant neoplasm of skin: Secondary | ICD-10-CM | POA: Diagnosis not present

## 2018-01-24 DIAGNOSIS — L728 Other follicular cysts of the skin and subcutaneous tissue: Secondary | ICD-10-CM | POA: Diagnosis not present

## 2018-01-24 DIAGNOSIS — L570 Actinic keratosis: Secondary | ICD-10-CM | POA: Diagnosis not present

## 2018-01-24 DIAGNOSIS — X32XXXA Exposure to sunlight, initial encounter: Secondary | ICD-10-CM | POA: Diagnosis not present

## 2018-01-31 ENCOUNTER — Ambulatory Visit: Payer: Self-pay | Admitting: Internal Medicine

## 2018-02-05 ENCOUNTER — Encounter: Payer: Self-pay | Admitting: Internal Medicine

## 2018-02-05 ENCOUNTER — Ambulatory Visit (INDEPENDENT_AMBULATORY_CARE_PROVIDER_SITE_OTHER): Payer: Medicare Other | Admitting: Internal Medicine

## 2018-02-05 VITALS — BP 146/66 | HR 51 | Temp 97.6°F | Ht 70.5 in | Wt 213.8 lb

## 2018-02-05 DIAGNOSIS — F17210 Nicotine dependence, cigarettes, uncomplicated: Secondary | ICD-10-CM

## 2018-02-05 DIAGNOSIS — K219 Gastro-esophageal reflux disease without esophagitis: Secondary | ICD-10-CM | POA: Diagnosis not present

## 2018-02-05 DIAGNOSIS — K760 Fatty (change of) liver, not elsewhere classified: Secondary | ICD-10-CM | POA: Diagnosis not present

## 2018-02-05 DIAGNOSIS — G4733 Obstructive sleep apnea (adult) (pediatric): Secondary | ICD-10-CM

## 2018-02-05 DIAGNOSIS — Z720 Tobacco use: Secondary | ICD-10-CM | POA: Diagnosis not present

## 2018-02-05 DIAGNOSIS — I10 Essential (primary) hypertension: Secondary | ICD-10-CM | POA: Diagnosis not present

## 2018-02-05 DIAGNOSIS — N529 Male erectile dysfunction, unspecified: Secondary | ICD-10-CM | POA: Diagnosis not present

## 2018-02-05 MED ORDER — BUPROPION HCL ER (SR) 150 MG PO TB12: ORAL_TABLET | ORAL | 2 refills | Status: DC

## 2018-02-05 MED ORDER — PANTOPRAZOLE SODIUM 40 MG PO TBEC
DELAYED_RELEASE_TABLET | ORAL | 3 refills | Status: DC
Start: 2018-02-05 — End: 2019-02-24

## 2018-02-05 NOTE — Progress Notes (Signed)
Pre visit review using our clinic review tool, if applicable. No additional management support is needed unless otherwise documented below in the visit note. 

## 2018-02-05 NOTE — Patient Instructions (Addendum)
F/u in 4-5 months  Think about hepatitis B vaccine    Hepatitis B Vaccine, Recombinant injection What is this medicine? HEPATITIS B VACCINE (hep uh TAHY tis B VAK seen) is a vaccine. It is used to prevent an infection with the hepatitis B virus. This medicine may be used for other purposes; ask your health care provider or pharmacist if you have questions. COMMON BRAND NAME(S): Engerix-B, Recombivax HB What should I tell my health care provider before I take this medicine? They need to know if you have any of these conditions: -fever, infection -heart disease -hepatitis B infection -immune system problems -kidney disease -an unusual or allergic reaction to vaccines, yeast, other medicines, foods, dyes, or preservatives -pregnant or trying to get pregnant -breast-feeding How should I use this medicine? This vaccine is for injection into a muscle. It is given by a health care professional. A copy of Vaccine Information Statements will be given before each vaccination. Read this sheet carefully each time. The sheet may change frequently. Talk to your pediatrician regarding the use of this medicine in children. While this drug may be prescribed for children as young as newborn for selected conditions, precautions do apply. Overdosage: If you think you have taken too much of this medicine contact a poison control center or emergency room at once. NOTE: This medicine is only for you. Do not share this medicine with others. What if I miss a dose? It is important not to miss your dose. Call your doctor or health care professional if you are unable to keep an appointment. What may interact with this medicine? -medicines that suppress your immune function like adalimumab, anakinra, infliximab -medicines to treat cancer -steroid medicines like prednisone or cortisone This list may not describe all possible interactions. Give your health care provider a list of all the medicines, herbs,  non-prescription drugs, or dietary supplements you use. Also tell them if you smoke, drink alcohol, or use illegal drugs. Some items may interact with your medicine. What should I watch for while using this medicine? See your health care provider for all shots of this vaccine as directed. You must have 3 shots of this vaccine for protection from hepatitis B infection. Tell your doctor right away if you have any serious or unusual side effects after getting this vaccine. What side effects may I notice from receiving this medicine? Side effects that you should report to your doctor or health care professional as soon as possible: -allergic reactions like skin rash, itching or hives, swelling of the face, lips, or tongue -breathing problems -confused, irritated -fast, irregular heartbeat -flu-like syndrome -numb, tingling pain -seizures -unusually weak or tired Side effects that usually do not require medical attention (report to your doctor or health care professional if they continue or are bothersome): -diarrhea -fever -headache -loss of appetite -muscle pain -nausea -pain, redness, swelling, or irritation at site where injected -tiredness This list may not describe all possible side effects. Call your doctor for medical advice about side effects. You may report side effects to FDA at 1-800-FDA-1088. Where should I keep my medicine? This drug is given in a hospital or clinic and will not be stored at home. NOTE: This sheet is a summary. It may not cover all possible information. If you have questions about this medicine, talk to your doctor, pharmacist, or health care provider.  2018 Elsevier/Gold Standard (2014-03-01 13:26:01)  Erectile Dysfunction Erectile dysfunction (ED) is the inability to get or keep an erection in order to have  sexual intercourse. Erectile dysfunction may include:  Inability to get an erection.  Lack of enough hardness of the erection to allow  penetration.  Loss of the erection before sex is finished.  What are the causes? This condition may be caused by:  Certain medicines, such as: ? Pain relievers. ? Antihistamines. ? Antidepressants. ? Blood pressure medicines. ? Water pills (diuretics). ? Ulcer medicines. ? Muscle relaxants. ? Drugs.  Excessive drinking.  Psychological causes, such as: ? Anxiety. ? Depression. ? Sadness. ? Exhaustion. ? Performance fear. ? Stress.  Physical causes, such as: ? Artery problems. This may include diabetes, smoking, liver disease, or atherosclerosis. ? High blood pressure. ? Hormonal problems, such as low testosterone. ? Obesity. ? Nerve problems. This may include back or pelvic injuries, diabetes mellitus, multiple sclerosis, or Parkinson disease.  What are the signs or symptoms? Symptoms of this condition include:  Inability to get an erection.  Lack of enough hardness of the erection to allow penetration.  Loss of the erection before sex is finished.  Normal erections at some times, but with frequent unsatisfactory episodes.  Low sexual satisfaction in either partner due to erection problems.  A curved penis occurring with erection. The curve may cause pain or the penis may be too curved to allow for intercourse.  Never having nighttime erections.  How is this diagnosed? This condition is often diagnosed by:  Performing a physical exam to find other diseases or specific problems with the penis.  Asking you detailed questions about the problem.  Performing blood tests to check for diabetes mellitus or to measure hormone levels.  Performing other tests to check for underlying health conditions.  Performing an ultrasound exam to check for scarring.  Performing a test to check blood flow to the penis.  Doing a sleep study at home to measure nighttime erections.  How is this treated? This condition may be treated by:  Medicine taken by mouth to help you  achieve an erection (oral medicine).  Hormone replacement therapy to replace low testosterone levels.  Medicine that is injected into the penis. Your health care provider may instruct you how to give yourself these injections at home.  Vacuum pump. This is a pump with a ring on it. The pump and ring are placed on the penis and used to create pressure that helps the penis become erect.  Penile implant surgery. In this procedure, you may receive: ? An inflatable implant. This consists of cylinders, a pump, and a reservoir. The cylinders can be inflated with a fluid that helps to create an erection, and they can be deflated after intercourse. ? A semi-rigid implant. This consists of two silicone rubber rods. The rods provide some rigidity. They are also flexible, so the penis can both curve downward in its normal position and become straight for sexual intercourse.  Blood vessel surgery, to improve blood flow to the penis. During this procedure, a blood vessel from a different part of the body is placed into the penis to allow blood to flow around (bypass) damaged or blocked blood vessels.  Lifestyle changes, such as exercising more, losing weight, and quitting smoking.  Follow these instructions at home: Medicines  Take over-the-counter and prescription medicines only as told by your health care provider. Do not increase the dosage without first discussing it with your health care provider.  If you are using self-injections, perform injections as directed by your health care provider. Make sure to avoid any veins that are on the  surface of the penis. After giving an injection, apply pressure to the injection site for 5 minutes. General instructions  Exercise regularly, as directed by your health care provider. Work with your health care provider to lose weight, if needed.  Do not use any products that contain nicotine or tobacco, such as cigarettes and e-cigarettes. If you need help quitting,  ask your health care provider.  Before using a vacuum pump, read the instructions that come with the pump and discuss any questions with your health care provider.  Keep all follow-up visits as told by your health care provider. This is important. Contact a health care provider if:  You feel nauseous.  You vomit. Get help right away if:  You are taking oral or injectable medicines and you have an erection that lasts longer than 4 hours. If your health care provider is unavailable, go to the nearest emergency room for evaluation. An erection that lasts much longer than 4 hours can result in permanent damage to your penis.  You have severe pain in your groin or abdomen.  You develop redness or severe swelling of your penis.  You have redness spreading up into your groin or lower abdomen.  You are unable to urinate.  You experience chest pain or a rapid heart beat (palpitations) after taking oral medicines. Summary  Erectile dysfunction (ED) is the inability to get or keep an erection during sexual intercourse. This problem can usually be treated successfully.  This condition is diagnosed based on a physical exam, your symptoms, and tests to determine the cause. Treatment varies depending on the cause, and may include medicines, hormone therapy, surgery, or vacuum pump.  You may need follow-up visits to make sure that you are using your medicines or devices correctly.  Get help right away if you are taking or injecting medicines and you have an erection that lasts longer than 4 hours. This information is not intended to replace advice given to you by your health care provider. Make sure you discuss any questions you have with your health care provider. Document Released: 10/26/2000 Document Revised: 11/14/2016 Document Reviewed: 11/14/2016 Elsevier Interactive Patient Education  2017 [employer].

## 2018-02-05 NOTE — Progress Notes (Signed)
Chief Complaint  Patient presents with  . Follow-up   Follow up  1. HTN slightly elevated today he takes Norvasc 5 mg and lotensin 40 mg at night around 8:30 b/c he does not like to take them during the day he reports his blood pressure at home runs lower than this today  2. ED issues even on Viagra 100 mg and he wants to disc options with urology today will refer  3. OSA on cpap with new machine which is doing well except for not tight enough seal and he will speak to CPAP company about this issue and get back with me  4. Tobacco abuse still smoking now 1/2 ppd Chantix costs too much. He is willing to try Wellbutrin and will purchase gum/lozengers in the future OTC. Disc 1800 quit now # to also call.    Review of Systems  Constitutional: Negative for weight loss.  HENT: Negative for hearing loss.   Eyes: Negative for blurred vision.  Respiratory: Negative for shortness of breath.   Cardiovascular: Negative for chest pain.  Gastrointestinal: Negative for abdominal pain.  Genitourinary:       +erectile dysfunction   Skin: Negative for rash.  Psychiatric/Behavioral: Negative for memory loss.   Past Medical History:  Diagnosis Date  . CAD (coronary artery disease)    a. s/p CABG in 2000 with LIMA-LAD, RIMA-PDA, SVG-D1, and SVG-PLA b. low-risk NST in 2016  . ED (erectile dysfunction)   . GERD (gastroesophageal reflux disease)   . Hyperlipidemia   . Hypertension    Followed by Dr. Claiborne Billings  . OSA on CPAP   . Sinus bradycardia   . Thrombocytopenia ([place])    Past Surgical History:  Procedure Laterality Date  . CATARACT EXTRACTION, BILATERAL  2010  . CORONARY ARTERY BYPASS GRAFT  2000   LIMA to the LAD,RIMA to the PDA,vein graft to the diagonal & vein graft to the PLA  . KNEE DEBRIDEMENT  1989   Fluid flush  . SHOULDER SURGERY  2010   bone spur   . US ECHOCARDIOGRAPHY  12/12/2010   mild LA dilatation, normal LV systolic fx, EF > 98%   Family History  Problem Relation Age of Onset    . Arthritis Mother   . Heart disease Father        CAD - MI  . Stroke Father   . Hypertension Father   . Cancer Father 36       esophageal cancer   . Arthritis Maternal Grandmother    Social History   Socioeconomic History  . Marital status: Married    Spouse name: Not on file  . Number of children: 3  . Years of education: 86  . Highest education level: Not on file  Occupational History  . Occupation: Retired Corporate treasurer  . Occupation: Management/Technical Support    Comment: Runner, broadcasting/film/video  . Financial resource strain: Not on file  . Food insecurity:    Worry: Not on file    Inability: Not on file  . Transportation needs:    Medical: Not on file    Non-medical: Not on file  Tobacco Use  . Smoking status: Current Every Day Smoker    Packs/day: 1.00    Years: 44.00    Pack years: 44.00    Types: Cigarettes  . Smokeless tobacco: Never Used  . Tobacco comment: on and off smoker   Substance and Sexual Activity  . Alcohol use: Yes    Alcohol/week: 6.0 oz  Types: 10 Cans of beer per week    Comment: Down to 3 beers a day from 4-6 a day   . Drug use: No  . Sexual activity: Yes    Partners: Female    Comment: Wife  Lifestyle  . Physical activity:    Days per week: Not on file    Minutes per session: Not on file  . Stress: Not on file  Relationships  . Social connections:    Talks on phone: Not on file    Gets together: Not on file    Attends religious service: Not on file    Active member of club or organization: Not on file    Attends meetings of clubs or organizations: Not on file    Relationship status: Not on file  . Intimate partner violence:    Fear of current or ex partner: Not on file    Emotionally abused: Not on file    Physically abused: Not on file    Forced sexual activity: Not on file  Other Topics Concern  . Not on file  Social History Narrative   Daryl grew up in [place]. He is currently living in [place] with his wife. This is  his second marriage. He has 1 daughter and 2 sons from his first marriage. He has 4 step kids. His daughter lives in [place], one son in [place], [place] and the other son in [place], [place]. 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 He enjoys wood working on his spare time. 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       2 brothers and 2 sisters he is next to youngest    No outpatient medications have been marked as taking for the 02/05/18 encounter (Office Visit) with [name], Nino Glow, MD.   No Known Allergies Recent Results (from the past 2160 hour(s))  TSH     Status: None   Collection Time: 12/03/17  1:37 PM  Result Value Ref Range   TSH 1.67 0.35 - 4.50 uIU/mL  PSA, Medicare     Status: None   Collection Time: 12/03/17  1:37 PM  Result Value Ref Range   PSA 0.96 0.10 - 4.00 ng/ml    Comment: Test performed using Access Hybritech PSA Assay, a parmagnetic partical, chemiluminecent immunoassay.  Urinalysis, Routine w reflex microscopic     Status: None   Collection Time: 12/03/17  1:37 PM  Result Value Ref Range   Color, Urine YELLOW Yellow;Lt. Yellow   APPearance CLEAR Clear   Specific Gravity, Urine 1.010 1.000 - 1.030   pH 6.5 5.0 - 8.0   Total Protein, Urine NEGATIVE Negative   Urine Glucose NEGATIVE Negative   Ketones, ur NEGATIVE Negative   Bilirubin Urine NEGATIVE Negative   Hgb urine dipstick NEGATIVE Negative   Urobilinogen, UA 0.2 0.0 - 1.0   Leukocytes, UA NEGATIVE Negative   Nitrite NEGATIVE Negative   WBC, UA 0-2/hpf 0-2/hpf   RBC / HPF none seen 0-2/hpf  Hepatitis B surface antigen     Status: None   Collection Time: 12/03/17  1:44 PM  Result Value Ref Range   Hepatitis B Surface Ag NON-REACTIVE NON-REACTI  Hepatitis B surface antibody     Status: Abnormal    Collection Time: 12/03/17  1:44 PM  Result Value Ref  Range   Hepatitis B-Post <5 (L) > OR = 10 mIU/mL    Comment: . Patient does not have immunity to hepatitis B virus. . For additional information, please refer to http://education.quest[facility].com/faq/FAQ105 (This link is being provided for informational/ educational purposes only).   Hepatitis C antibody     Status: None   Collection Time: 12/03/17  1:44 PM  Result Value Ref Range   Hepatitis C Ab NON-REACTIVE NON-REACTI   SIGNAL TO CUT-OFF 0.01 <1.00  Pathologist smear review     Status: None   Collection Time: 12/03/17  1:57 PM  Result Value Ref Range   Path Review      Comment: Myeloid population consists predominantly of mature segmented neutrophils with mild reactive changes. A few atypical lymphs. No immature cells are identified. RBCs appear to be microcytic and hypochromic on smear  review. Suggest evaluation for iron deficiency, if clinically indicated. Review of the peripheral smear reveals adequate numbers of platelets. Reviewed by Francis Gaines Rockne Coons, MD  (Electronic Signature on File)      12/04/2017.   CBC w/Diff     Status: None   Collection Time: 12/03/17  1:57 PM  Result Value Ref Range   WBC 8.8 3.8 - 10.8 Thousand/uL   RBC 5.13 4.20 - 5.80 Million/uL   Hemoglobin 15.9 13.2 - 17.1 g/dL   HCT 45.9 38.5 - 50.0 %   MCV 89.5 80.0 - 100.0 fL   MCH 31.0 27.0 - 33.0 pg   MCHC 34.6 32.0 - 36.0 g/dL   RDW 12.9 11.0 - 15.0 %   Platelets 147 140 - 400 Thousand/uL   MPV 11.2 7.5 - 12.5 fL   Neutro Abs 4,198 1,500 - 7,800 cells/uL   Lymphs Abs 3,687 850 - 3,900 cells/uL   WBC mixed population 757 200 - 950 cells/uL   Eosinophils Absolute 114 15 - 500 cells/uL   Basophils Absolute 44 0 - 200 cells/uL   Neutrophils Relative % 47.7 %   Total Lymphocyte 41.9 %   Monocytes Relative 8.6 %   Eosinophils Relative 1.3 %   Basophils Relative 0.5 %   Objective  Body mass index is 30.24 kg/m. Wt Readings  from Last 3 Encounters:  02/05/18 213 lb 12.8 oz (97 kg)  12/31/17 209 lb (94.8 kg)  12/03/17 219 lb 4 oz (99.5 kg)   Temp Readings from Last 3 Encounters:  02/05/18 97.6 F (36.4 C) (Oral)  12/03/17 98.1 F (36.7 C) (Oral)  05/03/17 98 F (36.7 C) (Oral)   BP Readings from Last 3 Encounters:  02/05/18 (!) 146/66  12/03/17 132/66  08/26/17 (!) 152/84   Pulse Readings from Last 3 Encounters:  02/05/18 (!) 51  12/03/17 61  08/26/17 (!) 40   O2 sat room air 98% Physical Exam  Constitutional: He is oriented to person, place, and time and well-developed, well-nourished, and in no distress.  HENT:  Head: Normocephalic and atraumatic.  Mouth/Throat:    Eyes: Pupils are equal, round, and reactive to light. Conjunctivae are normal.  Cardiovascular: Normal rate, regular rhythm and normal heart sounds.  Pulmonary/Chest: Effort normal and breath sounds normal.  Neurological: He is alert and oriented to person, place, and time. Gait normal. Gait normal.  Skin: Skin is warm, dry and intact.  Psychiatric: Mood, memory, affect and judgment normal.  Nursing note and vitals reviewed.   Assessment   1. Erectile dysfunction  2. HTN  3. OSA on cpap  4. Tobacco abuse  5. HM Plan  1. Refer to urology to disc options of tx and further w/u if needed  2. Cont meds for now pt will monitor BP at home  3. Per pt had new cpap machine referred for sleep study 11/2017 and he reports had and has new equipment but I dont have copy of report get from [facility]  4. Counseled x 3 minutes today  Pt willing to try gum/lozengers and Chantix too expensive also will Rx Wellbutrin  5.  Declined flu shot Had prevnar, zostervax, utd pna 23 vaccine Disc shingrix for future and Tdap  Disc hep B vaccine today not immune given info   Had PSA 0.96 will need DRE in future either I or urology can do  Lipid, A1C due 05/03/18  Hep C neg   CT chest due 12/31/2018 rec smoking cessation as above  US abdomen  12/10/17 neg normal spleen size, +fatty liver, 1.3 cm right kidney cyst  Colonoscopy had 02/16/14 tubular adenoma h/o polyps repeat in 5 years  Sees derm Dr. Kellie Moor last saw last week and had LN2 to face with h/o skin cancer and appt in 1 month to f/u with derm again   Continue to monitor tongue lesion and pt also to disc with his dentist.   "I spent 25 minutes face-to face with patient with greater than 50% of time spent counseling and/or in coordination of care urology referral, tobacco cessation counseling, health maintenance plan as above   Provider: Dr. [name] [name]-Internal Medicine

## 2018-02-21 DIAGNOSIS — L720 Epidermal cyst: Secondary | ICD-10-CM | POA: Diagnosis not present

## 2018-02-21 DIAGNOSIS — L728 Other follicular cysts of the skin and subcutaneous tissue: Secondary | ICD-10-CM | POA: Diagnosis not present

## 2018-02-21 DIAGNOSIS — R208 Other disturbances of skin sensation: Secondary | ICD-10-CM | POA: Diagnosis not present

## 2018-03-06 ENCOUNTER — Ambulatory Visit (INDEPENDENT_AMBULATORY_CARE_PROVIDER_SITE_OTHER): Payer: Medicare Other | Admitting: Urology

## 2018-03-06 ENCOUNTER — Encounter: Payer: Self-pay | Admitting: Urology

## 2018-03-06 VITALS — BP 136/72 | HR 55 | Ht 70.0 in | Wt 210.0 lb

## 2018-03-06 DIAGNOSIS — N5201 Erectile dysfunction due to arterial insufficiency: Secondary | ICD-10-CM | POA: Diagnosis not present

## 2018-03-06 MED ORDER — ALPROSTADIL (VASODILATOR) 20 MCG IC KIT: 20.0000 ug | PACK | INTRACAVERNOUS | 0 refills | Status: DC | PRN

## 2018-03-06 NOTE — Progress Notes (Signed)
03/06/2018 1:07 PM   Christian Cole 1951/01/27 [id number]  Referring provider: [name], Nino Glow, MD [place], [place] [address]  Chief Complaint  Patient presents with  . Erectile Dysfunction    New Patient    HPI: Christian Cole is a 67 year old male referred for evaluation of erectile dysfunction.  He has a 6-year history of ED and has been on Viagra 100 mg.  50% of the time he estimates the medication was effective over however over the last 2 years he states his pain is becomes engorged and he can only achieve penetration with compression of the base of the penis but will be unable to maintain this erection.  He denies pain or curvature.  Organic risk factors include coronary artery disease, hypertension, hyperlipidemia, antihypertensive medication and current tobacco use at 1/2 pack/day and smoking duration of 40 years.  His SHIM score was 6/25 indicating severe erectile dysfunction.  He denies prior urologic evaluation.  His only voiding symptom is decreased urinary stream the first thing in the morning which resolves after his first morning void.  A recent PSA was 0.96.   PMH: Past Medical History:  Diagnosis Date  . CAD (coronary artery disease)    a. s/p CABG in 2000 with LIMA-LAD, RIMA-PDA, SVG-D1, and SVG-PLA b. low-risk NST in 2016  . Cancer (HCC)    skin cancer follows Dr. Kellie Moor   . ED (erectile dysfunction)   . GERD (gastroesophageal reflux disease)   . Hyperlipidemia   . Hypertension    Followed by Dr. Claiborne Billings  . OSA on CPAP   . Sinus bradycardia   . Thrombocytopenia [facility])     Surgical History: Past Surgical History:  Procedure Laterality Date  . CATARACT EXTRACTION, BILATERAL  2010  . CORONARY ARTERY BYPASS GRAFT  2000   LIMA to the LAD,RIMA to the PDA,vein graft to the diagonal & vein graft to the PLA  . KNEE DEBRIDEMENT  1989   Fluid flush  . SHOULDER SURGERY  2010   bone spur   . US ECHOCARDIOGRAPHY  12/12/2010   mild LA  dilatation, normal LV systolic fx, EF > 51%    Home Medications:  Allergies as of 03/06/2018   No Known Allergies     Medication List        Accurate as of 03/06/18  1:07 PM. Always use your most recent med list.          amLODipine 5 MG tablet Commonly known as:  NORVASC Take 1 tablet (5 mg total) by mouth daily.   atorvastatin 40 MG tablet Commonly known as:  LIPITOR Take 1 tablet (40 mg total) by mouth daily at 6 PM.   benazepril 40 MG tablet Commonly known as:  LOTENSIN Take 1 tablet (40 mg total) by mouth daily.   buPROPion 150 MG 12 hr tablet Commonly known as:  WELLBUTRIN SR 1x per day x 3 days then increase to bid   clopidogrel 75 MG tablet Commonly known as:  PLAVIX Take 1 tablet (75 mg total) by mouth daily.   ezetimibe 10 MG tablet Commonly known as:  ZETIA Take 1 tablet (10 mg total) by mouth daily.   pantoprazole 40 MG tablet Commonly known as:  PROTONIX take 1 tablet by mouth once daily 30 minutes BEFORE A MEAL   sildenafil 100 MG tablet Commonly known as:  VIAGRA Take 1 tablet (100 mg total) by mouth as needed for erectile dysfunction.       Allergies: No  Known Allergies  Family History: Family History  Problem Relation Age of Onset  . Arthritis Mother   . Heart disease Father        CAD - MI  . Stroke Father   . Hypertension Father   . Cancer Father 11       esophageal cancer   . Arthritis Maternal Grandmother     Social History:  reports that he has been smoking cigarettes.  He has a 44.00 pack-year smoking history. He has never used smokeless tobacco. He reports that he drinks about 6.0 oz of alcohol per week. He reports that he does not use drugs.  ROS: UROLOGY Frequent Urination?: No Hard to postpone urination?: Yes Burning/pain with urination?: No Get up at night to urinate?: Yes Leakage of urine?: No Urine stream starts and stops?: No Trouble starting stream?: No Do you have to strain to urinate?: No Blood in urine?:  No Urinary tract infection?: No Sexually transmitted disease?: No Injury to kidneys or bladder?: No Painful intercourse?: No Weak stream?: No Erection problems?: Yes Penile pain?: No  Gastrointestinal Nausea?: No Vomiting?: No Indigestion/heartburn?: No Diarrhea?: No Constipation?: No  Constitutional Fever: No Night sweats?: No Weight loss?: No Fatigue?: No  Skin Skin rash/lesions?: No Itching?: No  Eyes Blurred vision?: No Double vision?: No  Ears/Nose/Throat Sore throat?: No Sinus problems?: No  Hematologic/Lymphatic Swollen glands?: No Easy bruising?: No  Cardiovascular Leg swelling?: No Chest pain?: No  Respiratory Cough?: No Shortness of breath?: No  Endocrine Excessive thirst?: No  Musculoskeletal Back pain?: No Joint pain?: No  Neurological Headaches?: No Dizziness?: No  Psychologic Depression?: No Anxiety?: No  Physical Exam: BP 136/72   Pulse (!) 55   Ht 5\' 10"  (1.778 m)   Wt 210 lb (95.3 kg)   BMI 30.13 kg/m   Constitutional:  Alert and oriented, No acute distress. HEENT: [place] AT, moist mucus membranes.  Trachea midline, no masses. Cardiovascular: No clubbing, cyanosis, or edema. Respiratory: Normal respiratory effort, no increased work of breathing. GI: Abdomen is soft, nontender, nondistended, no abdominal masses GU: No CVA tenderness.  Penis without palpable plaques.  Testes descended bilaterally without masses or tenderness. Lymph: No cervical or inguinal lymphadenopathy. Skin: No rashes, bruises or suspicious lesions. Neurologic: Grossly intact, no focal deficits, moving all 4 extremities. Psychiatric: Normal mood and affect.  Laboratory Data: Lab Results  Component Value Date   WBC 8.8 12/03/2017   HGB 15.9 12/03/2017   HCT 45.9 12/03/2017   MCV 89.5 12/03/2017   PLT 147 12/03/2017    Lab Results  Component Value Date   CREATININE 1.02 05/03/2017    Lab Results  Component Value Date   PSA 0.96 12/03/2017    PSA 0.69 01/06/2014    Lab Results  Component Value Date   HGBA1C 6.0 05/03/2017     Assessment & Plan:   67 year old male with severe erectile dysfunction and current use of Viagra at 100 mg is not effective.  He was informed it is unlikely that any other PDE 5 inhibitor would be beneficial.  I discussed other options including vacuum erection devices, intracavernosal injections, intraurethral alprostadil and penile implant surgery.  He was interested in starting intracavernosal injections.  An Rx will be sent for Edex and he will return for injection training.    Abbie Sons, [place] [address], [place], [place] [address] [phone]

## 2018-03-07 ENCOUNTER — Telehealth: Payer: Self-pay | Admitting: Urology

## 2018-03-07 ENCOUNTER — Telehealth: Payer: Self-pay

## 2018-03-07 NOTE — Telephone Encounter (Signed)
-----   Message from Benard Halsted sent at 03/06/2018  4:39 PM EDT ----- Please take care of this  thanks ----- Message ----- From: Abbie Sons, MD Sent: 03/06/2018   1:16 PM To: [facility] Admin  Please let the patient know I sent the Edex prescription to [facility].  Can make him a follow-up appointment to start injection training.

## 2018-03-07 NOTE — Telephone Encounter (Signed)
Dr. Bernardo Heater stated pt insurance will cover edex therefore was [facility]. Go ahead and make appt. Should pt not be able to get medication edex home delivery form has been filled out and given to [place].

## 2018-03-07 NOTE — Telephone Encounter (Signed)
Spoke with patient and he will contact the staff back once he get's his Edex and schedule his injection training appointment. He did state that it was on backorder with his pharmacy.

## 2018-03-10 DIAGNOSIS — E785 Hyperlipidemia, unspecified: Secondary | ICD-10-CM | POA: Diagnosis not present

## 2018-03-10 DIAGNOSIS — E669 Obesity, unspecified: Secondary | ICD-10-CM | POA: Diagnosis not present

## 2018-03-10 DIAGNOSIS — I10 Essential (primary) hypertension: Secondary | ICD-10-CM | POA: Diagnosis not present

## 2018-03-10 DIAGNOSIS — I259 Chronic ischemic heart disease, unspecified: Secondary | ICD-10-CM | POA: Diagnosis not present

## 2018-03-10 DIAGNOSIS — G4733 Obstructive sleep apnea (adult) (pediatric): Secondary | ICD-10-CM | POA: Diagnosis not present

## 2018-03-28 ENCOUNTER — Ambulatory Visit: Payer: Self-pay

## 2018-04-08 ENCOUNTER — Ambulatory Visit (INDEPENDENT_AMBULATORY_CARE_PROVIDER_SITE_OTHER): Payer: Medicare Other | Admitting: Urology

## 2018-04-08 ENCOUNTER — Encounter

## 2018-04-08 ENCOUNTER — Encounter: Payer: Self-pay | Admitting: Urology

## 2018-04-08 VITALS — BP 139/84 | HR 55 | Ht 70.0 in | Wt 209.4 lb

## 2018-04-08 DIAGNOSIS — N5201 Erectile dysfunction due to arterial insufficiency: Secondary | ICD-10-CM

## 2018-04-08 MED ORDER — ALPROSTADIL (VASODILATOR) 20 MCG IC KIT: 20.0000 ug | PACK | INTRACAVERNOUS | 3 refills | Status: DC | PRN

## 2018-04-08 NOTE — Progress Notes (Signed)
04/08/2018 8:38 AM   Christian Cole 11-10-1951 [id number]  Referring provider: [name], Nino Glow, MD [place], [place] [address]  Chief Complaint  Patient presents with  . Erectile Dysfunction    HPI: 67 year old male with ED who presents for intracavernosal injection training.   PMH: Past Medical History:  Diagnosis Date  . CAD (coronary artery disease)    a. s/p CABG in 2000 with LIMA-LAD, RIMA-PDA, SVG-D1, and SVG-PLA b. low-risk NST in 2016  . Cancer (HCC)    skin cancer follows Dr. Kellie Moor   . ED (erectile dysfunction)   . GERD (gastroesophageal reflux disease)   . Hyperlipidemia   . Hypertension    Followed by Dr. Claiborne Billings  . OSA on CPAP   . Sinus bradycardia   . Thrombocytopenia [facility])     Surgical History: Past Surgical History:  Procedure Laterality Date  . CATARACT EXTRACTION, BILATERAL  2010  . CORONARY ARTERY BYPASS GRAFT  2000   LIMA to the LAD,RIMA to the PDA,vein graft to the diagonal & vein graft to the PLA  . KNEE DEBRIDEMENT  1989   Fluid flush  . SHOULDER SURGERY  2010   bone spur   . US ECHOCARDIOGRAPHY  12/12/2010   mild LA dilatation, normal LV systolic fx, EF > 95%    Home Medications:  Allergies as of 04/08/2018   No Known Allergies     Medication List        Accurate as of 04/08/18  8:38 AM. Always use your most recent med list.          alprostadil 20 MCG injection Commonly known as:  EDEX 20 mcg by Intracavitary route as needed for erectile dysfunction. use no more than 3 times per week   amLODipine 5 MG tablet Commonly known as:  NORVASC Take 1 tablet (5 mg total) by mouth daily.   atorvastatin 40 MG tablet Commonly known as:  LIPITOR Take 1 tablet (40 mg total) by mouth daily at 6 PM.   benazepril 40 MG tablet Commonly known as:  LOTENSIN Take 1 tablet (40 mg total) by mouth daily.   buPROPion 150 MG 12 hr tablet Commonly known as:  WELLBUTRIN SR 1x per day x 3 days then increase to bid   clopidogrel 75 MG tablet Commonly known as:  PLAVIX Take 1 tablet (75 mg total) by mouth daily.   ezetimibe 10 MG tablet Commonly known as:  ZETIA Take 1 tablet (10 mg total) by mouth daily.   pantoprazole 40 MG tablet Commonly known as:  PROTONIX take 1 tablet by mouth once daily 30 minutes BEFORE A MEAL   sildenafil 100 MG tablet Commonly known as:  VIAGRA Take 1 tablet (100 mg total) by mouth as needed for erectile dysfunction.       Allergies: No Known Allergies  Family History: Family History  Problem Relation Age of Onset  . Arthritis Mother   . Heart disease Father        CAD - MI  . Stroke Father   . Hypertension Father   . Cancer Father 74       esophageal cancer   . Arthritis Maternal Grandmother     Social History:  reports that he has been smoking cigarettes.  He has a 44.00 pack-year smoking history. He has never used smokeless tobacco. He reports that he drinks about 6.0 oz of alcohol per week. He reports that he does not use drugs.  ROS: UROLOGY Frequent Urination?: No  Hard to postpone urination?: No Burning/pain with urination?: No Get up at night to urinate?: No Leakage of urine?: No Urine stream starts and stops?: No Trouble starting stream?: No Do you have to strain to urinate?: No Blood in urine?: No Urinary tract infection?: No Sexually transmitted disease?: No Injury to kidneys or bladder?: No Painful intercourse?: No Weak stream?: No Erection problems?: Yes Penile pain?: No  Gastrointestinal Nausea?: No Vomiting?: No Indigestion/heartburn?: No Diarrhea?: No Constipation?: No  Constitutional Fever: No Night sweats?: No Weight loss?: No Fatigue?: No  Skin Skin rash/lesions?: No Itching?: No  Eyes Blurred vision?: No Double vision?: No  Ears/Nose/Throat Sore throat?: No Sinus problems?: No  Hematologic/Lymphatic Swollen glands?: No Easy bruising?: No  Cardiovascular Leg swelling?: No Chest pain?:  No  Respiratory Cough?: No Shortness of breath?: No  Endocrine Excessive thirst?: No  Musculoskeletal Back pain?: No Joint pain?: No  Neurological Headaches?: No Dizziness?: No  Psychologic Depression?: No Anxiety?: No  Physical Exam: BP 139/84   Pulse (!) 55   Ht 5\' 10"  (1.778 m)   Wt 209 lb 6.4 oz (95 kg)   BMI 30.05 kg/m   Constitutional:  Alert and oriented, No acute distress.  Assessment & Plan:   I reviewed penile anatomy and landmarks and appropriate injection sites.  He was observed to successfully inject 10 mcg of Edex into the right corpus cavernosum.  He was instructed to call back with information regarding rigidity and duration of his erection for subsequent dose titration.  He was also informed to call should he have a rigid erection lasting longer than 2-3 hours.    Abbie Sons, [place] [address], [place], [place] [address] [phone]

## 2018-04-14 ENCOUNTER — Encounter: Payer: Self-pay | Admitting: Urology

## 2018-05-05 ENCOUNTER — Encounter: Payer: Self-pay | Admitting: Family Medicine

## 2018-06-30 ENCOUNTER — Encounter: Payer: Self-pay | Admitting: Urology

## 2018-07-06 MED ORDER — ALPROSTADIL (VASODILATOR) 40 MCG IC KIT: PACK | INTRACAVERNOUS | 1 refills | Status: DC

## 2018-07-10 ENCOUNTER — Other Ambulatory Visit: Payer: Self-pay

## 2018-07-10 NOTE — Progress Notes (Signed)
ERROR

## 2018-07-10 NOTE — Telephone Encounter (Signed)
Pt called and said [facility] needs more information to fill Rx (Edex 40 mg). They have tried since Monday to get in touch with [place].  Please call pt at [phone]

## 2018-07-15 ENCOUNTER — Encounter: Payer: Self-pay | Admitting: Internal Medicine

## 2018-07-15 ENCOUNTER — Ambulatory Visit (INDEPENDENT_AMBULATORY_CARE_PROVIDER_SITE_OTHER): Payer: Medicare Other | Admitting: Internal Medicine

## 2018-07-15 VITALS — BP 128/62 | HR 62 | Temp 98.8°F | Ht 70.0 in | Wt 208.2 lb

## 2018-07-15 DIAGNOSIS — K4090 Unilateral inguinal hernia, without obstruction or gangrene, not specified as recurrent: Secondary | ICD-10-CM | POA: Diagnosis not present

## 2018-07-15 DIAGNOSIS — Z1329 Encounter for screening for other suspected endocrine disorder: Secondary | ICD-10-CM

## 2018-07-15 DIAGNOSIS — Z0184 Encounter for antibody response examination: Secondary | ICD-10-CM

## 2018-07-15 DIAGNOSIS — E785 Hyperlipidemia, unspecified: Secondary | ICD-10-CM

## 2018-07-15 DIAGNOSIS — Z1159 Encounter for screening for other viral diseases: Secondary | ICD-10-CM

## 2018-07-15 DIAGNOSIS — K760 Fatty (change of) liver, not elsewhere classified: Secondary | ICD-10-CM

## 2018-07-15 DIAGNOSIS — I2510 Atherosclerotic heart disease of native coronary artery without angina pectoris: Secondary | ICD-10-CM

## 2018-07-15 DIAGNOSIS — E559 Vitamin D deficiency, unspecified: Secondary | ICD-10-CM | POA: Diagnosis not present

## 2018-07-15 DIAGNOSIS — Z720 Tobacco use: Secondary | ICD-10-CM | POA: Diagnosis not present

## 2018-07-15 DIAGNOSIS — R7303 Prediabetes: Secondary | ICD-10-CM | POA: Insufficient documentation

## 2018-07-15 DIAGNOSIS — N529 Male erectile dysfunction, unspecified: Secondary | ICD-10-CM | POA: Diagnosis not present

## 2018-07-15 DIAGNOSIS — Z125 Encounter for screening for malignant neoplasm of prostate: Secondary | ICD-10-CM | POA: Diagnosis not present

## 2018-07-15 DIAGNOSIS — I10 Essential (primary) hypertension: Secondary | ICD-10-CM

## 2018-07-15 NOTE — Patient Instructions (Addendum)
Tdap vaccines, Shingrix (shingles)  And  Twinrix vaccine here   sch fasting labs 12/03/17   CT chest 12/31/2018  Colonoscopy due 02/17/2019    Hepatitis A; Hepatitis B Vaccine injection What is this medicine? HEPATITIS A VACCINE; HEPATITIS B VACCINE (hep uh TAHY tis A vak SEEN; hep uh TAHY tis B vak SEEN) is a vaccine to protect from an infection with the hepatitis A and B virus. This vaccine does not contain the live viruses. It will not cause a hepatitis infection. This medicine may be used for other purposes; ask your health care provider or pharmacist if you have questions. COMMON BRAND NAME(S): Twinrix What should I tell my health care provider before I take this medicine? They need to know if you have any of these conditions: -bleeding disorder -fever or infection -heart disease -immune system problems -an unusual or allergic reaction to hepatitis A or B vaccine, neomycin, yeast, thimerosal, other medicines, foods, dyes, or preservatives -pregnant or trying to get pregnant -breast-feeding How should I use this medicine? This vaccine is for injection into a muscle. It is given by a health care professional. A copy of Vaccine Information Statements will be given before each vaccination. Read this sheet carefully each time. The sheet may change frequently. Talk to your pediatrician regarding the use of this medicine in children. Special care may be needed. Overdosage: If you think you have taken too much of this medicine contact a poison control center or emergency room at once. NOTE: This medicine is only for you. Do not share this medicine with others. What if I miss a dose? It is important not to miss your dose. Call your doctor or health care professional if you are unable to keep an appointment. What may interact with this medicine? -medicines that suppress your immune function like adalimumab, anakinra, infliximab -medicines to treat cancer -steroid medicines like prednisone  or cortisone This list may not describe all possible interactions. Give your health care provider a list of all the medicines, herbs, non-prescription drugs, or dietary supplements you use. Also tell them if you smoke, drink alcohol, or use illegal drugs. Some items may interact with your medicine. What should I watch for while using this medicine? See your health care provider for all shots of this vaccine as directed. You must have 3 to 4 shots of this vaccine for protection from hepatitis A and B infection. Tell your doctor right away if you have any serious or unusual side effects after getting this vaccine. What side effects may I notice from receiving this medicine? Side effects that you should report to your doctor or health care professional as soon as possible: -allergic reactions like skin rash, itching or hives, swelling of the face, lips, or tongue -breathing problems -confused, irritated -fast, irregular heartbeat -flu-like syndrome -numb, tingling pain -seizures Side effects that usually do not require medical attention (report to your doctor or health care professional if they continue or are bothersome): -diarrhea -fever -headache -loss of appetite -muscle pain -nausea -pain, redness, swelling, or irritation at site where injected -tiredness This list may not describe all possible side effects. Call your doctor for medical advice about side effects. You may report side effects to FDA at 1-800-FDA-1088. Where should I keep my medicine? This drug is given in a hospital or clinic and will not be stored at home. NOTE: This sheet is a summary. It may not cover all possible information. If you have questions about this medicine, talk to your doctor,  pharmacist, or health care provider.  2018 Elsevier/Gold Standard (2008-03-12 15:21:37)

## 2018-07-15 NOTE — Progress Notes (Signed)
Pre visit review using our clinic review tool, if applicable. No additional management support is needed unless otherwise documented below in the visit note. 

## 2018-07-15 NOTE — Progress Notes (Signed)
Chief Complaint  Patient presents with  . Follow-up   F/u  1. C/o right inguinal hernia x 3 months feels pain and pressure with coughing  2. ED needs refill of Edex by urology Dr. Bernardo Heater per pt [facility] did not get Rx contacted urology for pt but also asks he call them  3. Tobacco abuse still smoking 1/2 ppd stopped Wellbutrin not helping and too expensive chantix  4. Fatty liver rec hep A/B vaccine pt wants to think about it  5. HTN controlled today on norvasc 5 mg qd, lotensin 40 mg qd  6. CAD upcoming cards appt    Review of Systems  Constitutional: Negative for weight loss.  HENT: Negative for hearing loss.   Eyes: Negative for blurred vision.  Respiratory: Negative for shortness of breath.   Cardiovascular: Negative for chest pain.  Gastrointestinal: Negative for abdominal pain.  Musculoskeletal:       +right inguinal pain   Skin: Negative for rash.  Neurological: Negative for headaches.  Psychiatric/Behavioral: Negative for depression.   Past Medical History:  Diagnosis Date  . CAD (coronary artery disease)    a. s/p CABG in 2000 with LIMA-LAD, RIMA-PDA, SVG-D1, and SVG-PLA b. low-risk NST in 2016  . Cancer (HCC)    skin cancer follows Dr. Kellie Moor   . ED (erectile dysfunction)   . GERD (gastroesophageal reflux disease)   . Hyperlipidemia   . Hypertension    Followed by Dr. Claiborne Billings  . OSA on CPAP   . Sinus bradycardia   . Thrombocytopenia ([place])    Past Surgical History:  Procedure Laterality Date  . CATARACT EXTRACTION, BILATERAL  2010  . CORONARY ARTERY BYPASS GRAFT  2000   LIMA to the LAD,RIMA to the PDA,vein graft to the diagonal & vein graft to the PLA  . KNEE DEBRIDEMENT  1989   Fluid flush  . SHOULDER SURGERY  2010   bone spur   . US ECHOCARDIOGRAPHY  12/12/2010   mild LA dilatation, normal LV systolic fx, EF > 06%   Family History  Problem Relation Age of Onset  . Arthritis Mother   . Heart disease Father        CAD - MI  . Stroke Father   .  Hypertension Father   . Cancer Father 60       esophageal cancer   . Arthritis Maternal Grandmother    Social History   Socioeconomic History  . Marital status: Married    Spouse name: Not on file  . Number of children: 3  . Years of education: 34  . Highest education level: Not on file  Occupational History  . Occupation: Retired Corporate treasurer  . Occupation: Management/Technical Support    Comment: Runner, broadcasting/film/video  . Financial resource strain: Not on file  . Food insecurity:    Worry: Not on file    Inability: Not on file  . Transportation needs:    Medical: Not on file    Non-medical: Not on file  Tobacco Use  . Smoking status: Current Every Day Smoker    Packs/day: 1.00    Years: 44.00    Pack years: 44.00    Types: Cigarettes  . Smokeless tobacco: Never Used  . Tobacco comment: on and off smoker   Substance and Sexual Activity  . Alcohol use: Yes    Alcohol/week: 10.0 standard drinks    Types: 10 Cans of beer per week    Comment: Down to 3 beers a  day from 4-6 a day   . Drug use: No  . Sexual activity: Yes    Partners: Female    Comment: Wife  Lifestyle  . Physical activity:    Days per week: Not on file    Minutes per session: Not on file  . Stress: Not on file  Relationships  . Social connections:    Talks on phone: Not on file    Gets together: Not on file    Attends religious service: Not on file    Active member of club or organization: Not on file    Attends meetings of clubs or organizations: Not on file    Relationship status: Not on file  . Intimate partner violence:    Fear of current or ex partner: Not on file    Emotionally abused: Not on file    Physically abused: Not on file    Forced sexual activity: Not on file  Other Topics Concern  . Not on file  Social History Narrative   Avrey grew up in [place]. He is currently living in [place] with his wife. This is his second marriage. He has 1 daughter and 2 sons from his first  marriage. He has 4 step kids. His daughter lives in [place], one son in [place], [place] and the other son in [place], [place]. 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 He enjoys wood working on his spare time. 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       2 brothers and 2 sisters he is next to youngest    Current Meds  Medication Sig  . alprostadil (EDEX) 40 MCG injection Inject 25 mcg as directed.  Use no more than 3 times per week  . atorvastatin (LIPITOR) 40 MG tablet Take 1 tablet (40 mg total) by mouth daily at 6 PM.  . benazepril (LOTENSIN) 40 MG tablet Take 1 tablet (40 mg total) by mouth daily.  . clopidogrel (PLAVIX) 75 MG tablet Take 1 tablet (75 mg total) by mouth daily.  Marland Kitchen ezetimibe (ZETIA) 10 MG tablet Take 1 tablet (10 mg total) by mouth daily.  . pantoprazole (PROTONIX) 40 MG tablet take 1 tablet by mouth once daily 30 minutes BEFORE A MEAL  . [DISCONTINUED] buPROPion (WELLBUTRIN SR) 150 MG 12 hr tablet 1x per day x 3 days then increase to bid  . [DISCONTINUED] sildenafil (VIAGRA) 100 MG tablet Take 1 tablet (100 mg total) by mouth as needed for erectile dysfunction.   No Known Allergies No results found for this or any previous visit (from the past 2160 hour(s)). Objective  Body mass index is 29.87 kg/m. Wt Readings from Last 3 Encounters:  07/15/18 208 lb 3.2 oz (94.4 kg)  04/08/18 209 lb 6.4 oz (95 kg)  03/06/18 210 lb (95.3 kg)   Temp Readings from Last 3 Encounters:  07/15/18 98.8 F (37.1 C) (Oral)  02/05/18 97.6 F (36.4 C) (Oral)  12/03/17 98.1 F (36.7 C) (Oral)   BP Readings from Last 3 Encounters:  07/15/18 128/62  04/08/18 139/84  03/06/18 136/72   Pulse Readings from Last 3 Encounters:  07/15/18 62  04/08/18 (!) 55  03/06/18 (!) 55    Physical Exam   Constitutional: He is oriented to person, place, and time. Vital signs are normal. He appears  well-developed and well-nourished. He is cooperative.  HENT:  Head: Normocephalic and atraumatic.  Mouth/Throat: Oropharynx is clear and moist and mucous membranes are normal.  Eyes: Pupils are equal, round, and reactive to light. Conjunctivae are normal.  Cardiovascular: Normal rate, regular rhythm and normal heart sounds.  Pulmonary/Chest: Effort normal and breath sounds normal.  Abdominal: A hernia is present. Hernia confirmed positive in the right inguinal area.  Neurological: He is alert and oriented to person, place, and time. Gait normal.  Skin: Skin is warm, dry and intact.  Psychiatric: He has a normal mood and affect. His speech is normal and behavior is normal. Judgment and thought content normal. Cognition and memory are normal.  Nursing note and vitals reviewed.   Assessment   1. Right inguinal hernia  2. ED  3. Tobacco absue with lung nodule  4. Fatty liver  5. HTN/CAD 6. HM Plan  1. Refer Dr. Burt Knack further w/u  2. Sent Dr. Bernardo Heater message  3. rec cessation  CT chest due 12/31/2018  4. rec hep A/B vaccine  5. Cont meds  F/u cards  6.  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 Kellie Moor saw 2019 upcoming appt as well  Continue to monitor tongue lesion and pt also to disc with hiovider: Dr. [name] [name]-Internal Medicine

## 2018-07-17 ENCOUNTER — Ambulatory Visit (INDEPENDENT_AMBULATORY_CARE_PROVIDER_SITE_OTHER): Payer: Medicare Other | Admitting: Surgery

## 2018-07-17 ENCOUNTER — Encounter: Payer: Self-pay | Admitting: Surgery

## 2018-07-17 ENCOUNTER — Telehealth: Payer: Self-pay | Admitting: Urology

## 2018-07-17 VITALS — BP 140/78 | HR 80 | Temp 97.7°F | Resp 14 | Ht 69.0 in | Wt 207.0 lb

## 2018-07-17 DIAGNOSIS — I2510 Atherosclerotic heart disease of native coronary artery without angina pectoris: Secondary | ICD-10-CM

## 2018-07-17 DIAGNOSIS — K429 Umbilical hernia without obstruction or gangrene: Secondary | ICD-10-CM

## 2018-07-17 DIAGNOSIS — K4020 Bilateral inguinal hernia, without obstruction or gangrene, not specified as recurrent: Secondary | ICD-10-CM

## 2018-07-17 NOTE — Telephone Encounter (Signed)
[facility] stating that they did receive the return fax about the Edex cartridge 75mcg, however the pharmacist still has questions that need to be answered, asks that someone call them back within 48 hrs to [phone] reference # [id number]

## 2018-07-17 NOTE — Progress Notes (Signed)
Christian Cole is an 67 y.o. male.   Chief Complaint: Right groin bulge HPI: This patient with a right groin bulge.  He first noticed it in May or so of this year.  He has not had any pain but some mild discomfort.  He has no left-sided symptoms and no other problems.  He has had a cardiac bypass performed in the year 2000 and states that he can walk up stairs without shortness of breath or chest pain.  He is anticoagulated on  Plavix.  He goes off his Plavix for dental work without difficulty and has done that several times and in fact had knee surgery and shoulder surgery without any problems either.  Past Medical History:  Diagnosis Date  . CAD (coronary artery disease)    a. s/p CABG in 2000 with LIMA-LAD, RIMA-PDA, SVG-D1, and SVG-PLA b. low-risk NST in 2016  . Cancer (HCC)    skin cancer follows Dr. Kellie Moor   . ED (erectile dysfunction)   . GERD (gastroesophageal reflux disease)   . Hyperlipidemia   . Hypertension    Followed by Dr. Claiborne Billings  . OSA on CPAP   . Sinus bradycardia   . Thrombocytopenia ([place])     Past Surgical History:  Procedure Laterality Date  . CATARACT EXTRACTION, BILATERAL  2010  . CORONARY ARTERY BYPASS GRAFT  2000   LIMA to the LAD,RIMA to the PDA,vein graft to the diagonal & vein graft to the PLA  . KNEE DEBRIDEMENT  1989   Fluid flush  . SHOULDER SURGERY  2010   bone spur   . US ECHOCARDIOGRAPHY  12/12/2010   mild LA dilatation, normal LV systolic fx, EF > 69%    Family History  Problem Relation Age of Onset  . Arthritis Mother   . Heart disease Father        CAD - MI  . Stroke Father   . Hypertension Father   . Cancer Father 45       esophageal cancer   . Arthritis Maternal Grandmother    Social History:  reports that he has been smoking cigarettes. He has a 44.00 pack-year smoking history. He has never used smokeless tobacco. He reports that he drinks about 10.0 standard drinks of alcohol per week. He reports that he does not use  drugs.  Allergies: No Known Allergies   (Not in a hospital admission)   Review of Systems:   Review of Systems  Constitutional: Negative.   HENT: Negative.   Eyes: Negative.   Respiratory: Negative.   Cardiovascular: Negative.   Gastrointestinal: Negative for abdominal pain, constipation, diarrhea, heartburn, nausea and vomiting.  Genitourinary: Negative.   Musculoskeletal: Negative.   Skin: Negative.   Neurological: Negative.   Endo/Heme/Allergies: Negative.   Psychiatric/Behavioral: Negative.     Physical Exam:  Physical Exam  Constitutional: He is oriented to person, place, and time. He appears well-developed and well-nourished. No distress.  HENT:  Head: Normocephalic and atraumatic.  Eyes: Pupils are equal, round, and reactive to light. EOM are normal. Right eye exhibits no discharge. Left eye exhibits no discharge. No scleral icterus.  Neck: Normal range of motion. Neck supple.  Cardiovascular: Normal rate, regular rhythm and normal heart sounds.  Pulmonary/Chest: Effort normal and breath sounds normal. No stridor. No respiratory distress. He has no wheezes. He has no rales.  Abdominal: Soft. He exhibits no distension and no mass. There is no tenderness. There is no guarding. A hernia is present.  Small reducible umbilical  hernia subcentimeter incised  Genitourinary: Penis normal.  Genitourinary Comments: Patient examined standing supine with Valsalva. Testicles are normal.  Fairly large right inguinal hernia non-scrotal in nature reducible and nontender smaller left inguinal hernia.  Musculoskeletal: Normal range of motion. He exhibits no edema.  Scar of right knee and right saphenous vein harvesting Minimal if any edema  Neurological: He is alert and oriented to person, place, and time.  Skin: Skin is warm and dry. He is not diaphoretic. No erythema.  Psychiatric: He has a normal mood and affect. Judgment normal.  Vitals reviewed.   Blood pressure 140/78,  pulse 80, temperature 97.7 F (36.5 C), temperature source Oral, resp. rate 14, height 5\' 9"  (1.753 m), weight 207 lb (93.9 kg).    No results found for this or any previous visit (from the past 48 hour(s)). No results found.   Assessment/Plan Patient who presents with right groin pain and an obvious right inguinal hernia.  He also has a smaller asymptomatic left inguinal hernia and an asymptomatic umbilical hernia.  His symptoms are so minimal at this point he wishes to see his cardiologist and discuss his surgical implications with his heart disease.  He is minimally symptomatic from that at this point.  He also has a problem list history of thrombocytopenia but that has passed and he has had normal platelet counts since then.  Does not take aspirin at this time but just Plavix.  The plan is for him to see his cardiologist and make a decision about her elective surgery which I discussed with him and the implications of elective versus emergent surgery.  I also discussed him following up with Dr. Dahlia Byes in my absence as he could perform this operation without difficulty with similar results as if I had done the operation myself.  I believe he will be in good hands with Dr. Dahlia Byes. Florene Glen, MD, FACS

## 2018-07-17 NOTE — Patient Instructions (Signed)

## 2018-07-18 NOTE — Telephone Encounter (Signed)
This rx has been handled and shipped

## 2018-08-06 DIAGNOSIS — L820 Inflamed seborrheic keratosis: Secondary | ICD-10-CM | POA: Diagnosis not present

## 2018-08-06 DIAGNOSIS — D485 Neoplasm of uncertain behavior of skin: Secondary | ICD-10-CM | POA: Diagnosis not present

## 2018-08-06 DIAGNOSIS — L538 Other specified erythematous conditions: Secondary | ICD-10-CM | POA: Diagnosis not present

## 2018-08-06 DIAGNOSIS — L84 Corns and callosities: Secondary | ICD-10-CM | POA: Diagnosis not present

## 2018-08-06 DIAGNOSIS — C44629 Squamous cell carcinoma of skin of left upper limb, including shoulder: Secondary | ICD-10-CM | POA: Diagnosis not present

## 2018-08-06 DIAGNOSIS — L298 Other pruritus: Secondary | ICD-10-CM | POA: Diagnosis not present

## 2018-09-07 ENCOUNTER — Other Ambulatory Visit: Payer: Self-pay | Admitting: Student

## 2018-09-09 NOTE — Telephone Encounter (Signed)
This is Dr. Kelly's pt. °

## 2018-09-18 DIAGNOSIS — C44629 Squamous cell carcinoma of skin of left upper limb, including shoulder: Secondary | ICD-10-CM | POA: Diagnosis not present

## 2018-10-17 ENCOUNTER — Other Ambulatory Visit: Payer: Self-pay | Admitting: Student

## 2018-10-21 ENCOUNTER — Ambulatory Visit (INDEPENDENT_AMBULATORY_CARE_PROVIDER_SITE_OTHER): Payer: Medicare Other | Admitting: Cardiovascular Disease

## 2018-10-21 ENCOUNTER — Encounter: Payer: Self-pay | Admitting: Cardiovascular Disease

## 2018-10-21 ENCOUNTER — Other Ambulatory Visit: Payer: Self-pay | Admitting: Urology

## 2018-10-21 ENCOUNTER — Telehealth: Payer: Self-pay | Admitting: *Deleted

## 2018-10-21 ENCOUNTER — Other Ambulatory Visit: Payer: Self-pay | Admitting: Student

## 2018-10-21 VITALS — BP 134/78 | HR 56 | Ht 70.5 in | Wt 205.2 lb

## 2018-10-21 DIAGNOSIS — E785 Hyperlipidemia, unspecified: Secondary | ICD-10-CM

## 2018-10-21 DIAGNOSIS — N529 Male erectile dysfunction, unspecified: Secondary | ICD-10-CM | POA: Diagnosis not present

## 2018-10-21 DIAGNOSIS — I2510 Atherosclerotic heart disease of native coronary artery without angina pectoris: Secondary | ICD-10-CM

## 2018-10-21 DIAGNOSIS — Z720 Tobacco use: Secondary | ICD-10-CM

## 2018-10-21 DIAGNOSIS — I10 Essential (primary) hypertension: Secondary | ICD-10-CM

## 2018-10-21 DIAGNOSIS — R0989 Other specified symptoms and signs involving the circulatory and respiratory systems: Secondary | ICD-10-CM

## 2018-10-21 DIAGNOSIS — G4733 Obstructive sleep apnea (adult) (pediatric): Secondary | ICD-10-CM

## 2018-10-21 MED ORDER — ALPROSTADIL (VASODILATOR) 40 MCG IC KIT: PACK | INTRACAVERNOUS | 18 refills | Status: DC

## 2018-10-21 NOTE — Telephone Encounter (Signed)
Received request from Christian Cole, scheduler at [facility], that patient would like to have exercise myoview here at [place] instead of [place]. New order entered. Please contact patient to schedule exercise myoview for sometime in June 2020. Thanks! If patient needs any further instructions, please let us know.

## 2018-10-21 NOTE — Patient Instructions (Signed)
Medication Instructions:  Your physician recommends that you continue on your current medications as directed. Please refer to the Current Medication list given to you today.  If you need a refill on your cardiac medications before your next appointment, please call your pharmacy.   Testing/Procedures: Your physician has requested that you have a carotid duplex in 6 MONTHS. This test is an ultrasound of the carotid arteries in your neck. It looks at blood flow through these arteries that supply the brain with blood. Allow one hour for this exam. There are no restrictions or special instructions.  Your physician has requested that you have an exercise stress myoview in 6 MONTHS. For further information please visit [email or web address]. Please follow instruction sheet, as given.  Follow-Up: At [facility], you and your health needs are our priority.  As part of our continuing mission to provide you with exceptional heart care, we have created designated Provider Care Teams.  These Care Teams include your primary Cardiologist (physician) and Advanced Practice Providers (APPs -  Physician Assistants and Nurse Practitioners) who all work together to provide you with the care you need, when you need it. You will need a follow up appointment in 6 months.  Please call our office 2 months in advance to schedule this appointment.  You may see Dr. Claiborne Billings or one of the following Advanced Practice Providers on your designated Care Team: [place], [place] . Fabian Sharp, PA-C

## 2018-10-21 NOTE — Progress Notes (Signed)
Patient ID: Christian Cole, male   DOB: 1951-03-22, 67 y.o.   MRN: [id number]     HPI: Christian Cole is a 67 y.o. male the presents for a 63 month cardiology evaluation.  Christian Cole has known CAD and underwent initial percutaneous coronary intervention in 1998 of an unknown vessel. He underwent CABG revascularization surgery in December 2000 with LIMA to LAD, LIMA to the PDA, vein to the diagonal, and then to the PLA. 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  An echo Doppler study in January 2002 showed normal systolic function. His mild TR and mild Christian. A nuclear perfusion study in March 2013 showed normal perfusion without scar or ischemia.  Since I last saw him one year ago he has continued to do well.  He remains active.  He denies any symptoms of chest pain.  He he had seen Cecilie Kicks in March 2016 and she had reduced his metoprolol to 12.5 mg twice a day due to asymptomatic bradycardia.  He had blood work in December 2015 by his primary care physician and his total cholesterol was 166, HDL 32, LDL 104, VLDL 29 and triglycerides 149.  He has been maintained on atorvastatin 40 mg, Plavix 75 mg, pantoprazole 40 mg, as well as Viagra as needed.  He underwent a nuclear perfusion study on April 26, 2015.  He remained asymptomatic.  He developed asymptomatic ST segment depression during stress.  Scintigraphic images were entirely normal.  He had normal function.  Ejection fraction was 52%.  He presents for evaluation.  I last saw him in October 2017 at which time he was doing well and was without chest pain.  He was exercising daily.  He had lost over 40 pounds in the 8 months prior to that evaluation.  His peak weight was 242 and his weight this morning without clothes at home was 195.  He continues to use CPAP with 100% compliance.   Since I last saw  him, he was evaluated by Bernerd Pho in October 2018.  His blood pressure was elevated and amlodipine 5 mg was added to his benazepril and Lopressor.  He had resumed smoking and at that time was given a prescription for Chantix.  Since I last saw him, he states he received a new CPAP machine.  He is sleeping well and cannot sleep without his CPAP.  Unfortunately he is still smoking 1/2 pack of cigarettes per day.  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  He walks at least 2 miles.  He denies any chest pain PND orthopnea.  He presents for evaluation.  Past Medical History:  Diagnosis Date  . CAD (coronary artery disease)    a. s/p CABG in 2000 with LIMA-LAD, RIMA-PDA, SVG-D1, and SVG-PLA b. low-risk NST in 2016  . Cancer (HCC)    skin cancer follows Dr. Kellie Moor   . ED (erectile dysfunction)   . GERD (gastroesophageal reflux disease)   . Hyperlipidemia   . Hypertension    Followed by Dr. Claiborne Billings  . OSA on CPAP   . Sinus bradycardia   . Thrombocytopenia ([place])     Past Surgical History:  Procedure Laterality Date  . CATARACT EXTRACTION, BILATERAL  2010  . CORONARY ARTERY BYPASS GRAFT  2000   LIMA to the LAD,RIMA to the PDA,vein graft to the diagonal & vein graft to the PLA  . KNEE DEBRIDEMENT  1989   Fluid flush  . SHOULDER SURGERY  2010   bone spur   . US ECHOCARDIOGRAPHY  12/12/2010   mild LA dilatation, normal LV systolic fx, EF > 00%    No Known Allergies  Current Outpatient Medications  Medication Sig Dispense Refill  . atorvastatin (LIPITOR) 40 MG tablet Take 1 tablet (40 mg total) by mouth daily at 6 PM. KEEP OV. 90 tablet 0  . benazepril (LOTENSIN) 40 MG tablet Take 1 tablet (40 mg total) by mouth daily. 90 tablet 3  . ezetimibe (ZETIA) 10 MG tablet Take 1 tablet (10 mg total) by mouth daily. NEED OV. 90 tablet 0  . pantoprazole (PROTONIX) 40 MG tablet take 1 tablet by mouth once daily 30 minutes BEFORE A MEAL 90 tablet 3  . alprostadil (EDEX) 40 MCG  injection Inject 25 mcg as directed.  Use no more than 2 times per week 2 each 18  . amLODipine (NORVASC) 5 MG tablet Take 1 tablet (5 mg total) by mouth daily. 90 tablet 3  . clopidogrel (PLAVIX) 75 MG tablet TAKE 1 TABLET DAILY 90 tablet 3   No current facility-administered medications for this visit.     Social History   Socioeconomic History  . Marital status: Married    Spouse name: Not on file  . Number of children: 3  . Years of education: 51  . Highest education level: Not on file  Occupational History  . Occupation: Retired Corporate treasurer  . Occupation: Management/Technical Support    Comment: Runner, broadcasting/film/video  . Financial resource strain: Not on file  . Food insecurity:    Worry: Not on file    Inability: Not on file  . Transportation needs:    Medical: Not on file    Non-medical: Not on file  Tobacco Use  . Smoking status: Current Every Day Smoker    Packs/day: 1.00    Years: 44.00    Pack years: 44.00    Types: Cigarettes  . Smokeless tobacco: Never Used  . Tobacco comment: on and off smoker   Substance and Sexual Activity  . Alcohol use: Yes    Alcohol/week: 10.0 standard drinks    Types: 10 Cans of beer per week    Comment: Down to 3 beers a day from 4-6 a day   . Drug use: No  . Sexual activity: Yes    Partners: Female    Comment: Wife  Lifestyle  . Physical activity:    Days per week: Not on file    Minutes per session: Not on file  . Stress: Not on file  Relationships  . Social connections:    Talks on phone: Not on file    Gets together: Not on file    Attends religious service: Not on file    Active member of club or organization: Not on file    Attends meetings of clubs or organizations: Not on file    Relationship status: Not on file  . Intimate partner violence:    Fear of current or ex partner: Not on file    Emotionally abused: Not on file    Physically abused: Not on file    Forced sexual activity: Not on file  Other Topics  Concern  . Not on file  Social History Narrative   Lorraine grew up in [place]. He is currently living in [place] with his wife. This is his second marriage. He has 1 daughter and 2 sons from his  first marriage. He has 4 step kids. His daughter lives in [place], one son in [place], [place] and the other son in [place], [place]. 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 He enjoys wood working on his spare time. 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       2 brothers and 2 sisters he is next to youngest     Family History  Problem Relation Age of Onset  . Arthritis Mother   . Heart disease Father        CAD - MI  . Stroke Father   . Hypertension Father   . Cancer Father 25       esophageal cancer   . Arthritis Maternal Grandmother     ROS General: Negative; No fevers, chills, or night sweats;  HEENT: Negative; No changes in vision or hearing, sinus congestion, difficulty swallowing Pulmonary: Negative; No cough, wheezing, shortness of breath, hemoptysis Cardiovascular: See history of present illness GI: Negative; No nausea, vomiting, diarrhea, or abdominal pain GU: Negative; No dysuria, hematuria, or difficulty voiding Musculoskeletal: Negative; no myalgias, joint pain, or weakness Hematologic/Oncology: Negative; no easy bruising, bleeding Endocrine: Negative; no heat/cold intolerance; no diabetes Neuro: Negative; no changes in balance, headaches Skin: Negative; No rashes or skin lesions Psychiatric: Negative; No behavioral problems, depression Sleep: Positive for OSA on CPAP therapy.  No snoring, daytime sleepiness, hypersomnolence, bruxism, restless legs, hypnogognic hallucinations, no cataplexy Other comprehensive 14 point system review is negative.  PE BP 134/78   Pulse (!) 56   Ht 5'  10.5" (1.791 m)   Wt 205 lb 3.2 oz (93.1 kg)   BMI 29.03 kg/m    Repeat BP blood pressure by me was 126/74  Wt Readings from Last 3 Encounters:  10/21/18 205 lb 3.2 oz (93.1 kg)  07/17/18 207 lb (93.9 kg)  07/15/18 208 lb 3.2 oz (94.4 kg)   General: Alert, oriented, no distress.  Skin: normal turgor, no rashes, warm and dry HEENT: Normocephalic, atraumatic. Pupils equal round and reactive to light; sclera anicteric; extraocular muscles intact;  Nose without nasal septal hypertrophy Mouth/Parynx benign; Mallinpatti scale 3 Neck: No JVD, left carotid bruit; normal carotid upstroke Lungs: clear to ausculatation and percussion; no wheezing or rales Chest wall: without tenderness to palpitation Heart: PMI not displaced, RRR, s1 s2 normal, 1/6 systolic murmur, no diastolic murmur, no rubs, gallops, thrills, or heaves Abdomen: soft, nontender; no hepatosplenomehaly, BS+; abdominal aorta nontender and not dilated by palpation. Back: no CVA tenderness Pulses 2+ Musculoskeletal: full range of motion, normal strength, no joint deformities Extremities: no clubbing cyanosis or edema, Homan's sign negative  Neurologic: grossly nonfocal; Cranial nerves grossly wnl Psychologic: Normal mood and affect   ECG (independently read by me): Sinus bradycardia at 56 bpm.  First-degree block with PR interval 264 ms.  QTc interval 443 ms  October 2017 ECG (independently read by me): Sinus bradycardia at 44 bpm with first degree AV block.  Q wave in leads III and F.  PR interval 224 ms.  June 2016 ECG (independently read by me): not done today due to recent treadmill nuclear study  ECG (independently read by me): Sinus bradycardia at 45 beats per minute. Normal intervals. Q waves in lead 3 and small Q wave in aVF.  LABS: BMP Latest Ref Rng & Units  05/03/2017 05/03/2016 10/28/2014  Glucose 70 - 99 mg/dL 100(H) 119(H) 92  BUN 6 - 23 mg/dL '16 19 15  '$ Creatinine 0.40 - 1.50 mg/dL 1.02 1.07 1.1  Sodium 135 -  145 mEq/L 141 140 139  Potassium 3.5 - 5.1 mEq/L 4.3 4.4 4.6  Chloride 96 - 112 mEq/L 107 108 104  CO2 19 - 32 mEq/L '27 29 27  '$ Calcium 8.4 - 10.5 mg/dL 9.5 9.4 9.2   Hepatic Function Latest Ref Rng & Units 05/03/2017 05/03/2016 10/28/2014  Total Protein 6.0 - 8.3 g/dL 6.6 6.5 7.0  Albumin 3.5 - 5.2 g/dL 4.4 4.2 4.5  AST 0 - 37 U/L '17 18 27  '$ ALT 0 - 53 U/L '18 20 31  '$ Alk Phosphatase 39 - 117 U/L 86 74 96  Total Bilirubin 0.2 - 1.2 mg/dL 0.7 0.6 1.1   CBC Latest Ref Rng & Units 12/03/2017 05/03/2017 05/03/2016  WBC 3.8 - 10.8 Thousand/uL 8.8 8.3 8.0  Hemoglobin 13.2 - 17.1 g/dL 15.9 14.0 13.0  Hematocrit 38.5 - 50.0 % 45.9 41.0 39.3  Platelets 140 - 400 Thousand/uL 147 112.0(L) 110.0(L)   Lab Results  Component Value Date   MCV 89.5 12/03/2017   MCV 94.4 05/03/2017   MCV 93.8 05/03/2016   Lab Results  Component Value Date   TSH 1.67 12/03/2017   Lab Results  Component Value Date   HGBA1C 6.0 05/03/2017    Lipid Panel     Component Value Date/Time   CHOL 122 05/03/2017 1056   TRIG 75.0 05/03/2017 1056   HDL 54.70 05/03/2017 1056   CHOLHDL 2 05/03/2017 1056   VLDL 15.0 05/03/2017 1056   LDLCALC 52 05/03/2017 1056   LDLDIRECT 91.6 01/06/2014 1443    RADIOLOGY: No results found.  IMPRESSION: 1. Coronary artery disease involving native coronary artery of native heart without angina pectoris   2. Essential hypertension   3. Left Carotid bruit   4. OSA (obstructive sleep apnea)   5. Hyperlipidemia LDL goal <70   6. Erectile dysfunction, unspecified erectile dysfunction type   7. Tobacco use     ASSESSMENT AND PLAN: Christian. Benny Cole is a 67 year-old gentleman with history of CAD who underwent initial intervention in 1998 and due to progressive CAD underwent CABG surgery in 2000. A nuclear perfusion study in 2013 remained normal without scar or ischemia.  He underwent a  three-year follow-up nuclear perfusion study on 04/26/2015 which continued to show entirely normal  perfusion without scar or ischemia.  However, he developed asymptomatic ST depression during stress.  For this reason, it was interpreted as intermediate study.  Post-rest ejection fraction was 52%.  There were no wall motion abnormalities.  Currently he has been without recurrent anginal symptomatology.  He is now on amlodipine 5 mg, benazepril 40 mg, 4 hypertension.  Blood pressure today is controlled.  He continues to be on Plavix.  He has hyperlipidemia and is on atorvastatin 40 mg and Zetia 10 mg.  In June 2018 LDL cholesterol was 52.  He will be undergoing full laboratory evaluation from his primary physician in January.  On exam today I heard a left carotid bruit which was not present on my previous evaluation.  I am scheduling him to undergo carotid duplex imaging.  He continues to use CPAP with 100% compliance.  He is unaware of breakthrough snoring.  His sleep is restorative.  Unfortunately, he has resumed smoking cigarettes and is currently smoking 1/2 pack/day.  He had tried Chantix in the  past.  He will be seeing his primary physician and discussed possibility of Wellbutrin.  He is now 19 years status post CABG revascularization surgery.  With his recurrence of smoking I have recommended that prior to his next office visit with me he undergo a 3-year follow-up exercise nuclear perfusion study.  He has erectile dysfunction and is now on alprostadil penile injections prior to intercourse.  He has GERD on pantoprazole.  I again discussed the importance of smoking cessation.  I will see him in 6 months for reevaluation or sooner problems arise.    Time spent: 30 minutes Troy Sine, MD, [facility]  10/22/2018 9:08 PM

## 2018-10-21 NOTE — Telephone Encounter (Signed)
Scheduled 6/11 at [facility]

## 2018-10-22 ENCOUNTER — Encounter: Payer: Self-pay | Admitting: Cardiovascular Disease

## 2018-10-26 ENCOUNTER — Other Ambulatory Visit: Payer: Self-pay | Admitting: Student

## 2018-12-03 ENCOUNTER — Other Ambulatory Visit (INDEPENDENT_AMBULATORY_CARE_PROVIDER_SITE_OTHER): Payer: Medicare Other

## 2018-12-03 DIAGNOSIS — Z0184 Encounter for antibody response examination: Secondary | ICD-10-CM

## 2018-12-03 DIAGNOSIS — I10 Essential (primary) hypertension: Secondary | ICD-10-CM | POA: Diagnosis not present

## 2018-12-03 DIAGNOSIS — Z1159 Encounter for screening for other viral diseases: Secondary | ICD-10-CM

## 2018-12-03 DIAGNOSIS — Z125 Encounter for screening for malignant neoplasm of prostate: Secondary | ICD-10-CM | POA: Diagnosis not present

## 2018-12-03 DIAGNOSIS — R7303 Prediabetes: Secondary | ICD-10-CM

## 2018-12-03 DIAGNOSIS — E559 Vitamin D deficiency, unspecified: Secondary | ICD-10-CM | POA: Diagnosis not present

## 2018-12-03 DIAGNOSIS — Z1329 Encounter for screening for other suspected endocrine disorder: Secondary | ICD-10-CM

## 2018-12-03 DIAGNOSIS — E785 Hyperlipidemia, unspecified: Secondary | ICD-10-CM

## 2018-12-03 LAB — CBC WITH DIFFERENTIAL/PLATELET
Basophils Absolute: 0 10*3/uL (ref 0.0–0.1)
Basophils Relative: 0.3 % (ref 0.0–3.0)
Eosinophils Absolute: 0.1 10*3/uL (ref 0.0–0.7)
Eosinophils Relative: 1.5 % (ref 0.0–5.0)
HCT: 45.2 % (ref 39.0–52.0)
Hemoglobin: 15.6 g/dL (ref 13.0–17.0)
Lymphocytes Relative: 35.8 % (ref 12.0–46.0)
Lymphs Abs: 3 10*3/uL (ref 0.7–4.0)
MCHC: 34.5 g/dL (ref 30.0–36.0)
MCV: 94.2 fl (ref 78.0–100.0)
Monocytes Absolute: 0.7 10*3/uL (ref 0.1–1.0)
Monocytes Relative: 8.1 % (ref 3.0–12.0)
Neutro Abs: 4.5 10*3/uL (ref 1.4–7.7)
Neutrophils Relative %: 54.3 % (ref 43.0–77.0)
Platelets: 138 10*3/uL — ABNORMAL LOW (ref 150.0–400.0)
RBC: 4.8 Mil/uL (ref 4.22–5.81)
RDW: 13.6 % (ref 11.5–15.5)
WBC: 8.3 10*3/uL (ref 4.0–10.5)

## 2018-12-03 LAB — TSH: TSH: 1.43 u[IU]/mL (ref 0.35–4.50)

## 2018-12-03 LAB — VITAMIN D 25 HYDROXY (VIT D DEFICIENCY, FRACTURES): VITD: 26.82 ng/mL — ABNORMAL LOW (ref 30.00–100.00)

## 2018-12-03 LAB — COMPREHENSIVE METABOLIC PANEL
ALT: 16 U/L (ref 0–53)
AST: 18 U/L (ref 0–37)
Albumin: 4.7 g/dL (ref 3.5–5.2)
Alkaline Phosphatase: 81 U/L (ref 39–117)
BUN: 12 mg/dL (ref 6–23)
CO2: 27 mEq/L (ref 19–32)
Calcium: 10 mg/dL (ref 8.4–10.5)
Chloride: 101 mEq/L (ref 96–112)
Creatinine, Ser: 0.96 mg/dL (ref 0.40–1.50)
GFR: 77.7 mL/min (ref >60.00)
Glucose, Bld: 91 mg/dL (ref 70–99)
Potassium: 4.5 mEq/L (ref 3.5–5.1)
Sodium: 138 mEq/L (ref 135–145)
Total Bilirubin: 0.8 mg/dL (ref 0.2–1.2)
Total Protein: 7.5 g/dL (ref 6.0–8.3)

## 2018-12-03 LAB — LIPID PANEL
Cholesterol: 145 mg/dL (ref 0–200)
HDL: 59.1 mg/dL (ref >39.00)
LDL Cholesterol: 63 mg/dL (ref 0–99)
NonHDL: 85.63
Total CHOL/HDL Ratio: 2
Triglycerides: 113 mg/dL (ref 0.0–149.0)
VLDL: 22.6 mg/dL (ref 0.0–40.0)

## 2018-12-03 LAB — HEMOGLOBIN A1C: Hgb A1c MFr Bld: 6 % (ref 4.6–6.5)

## 2018-12-03 LAB — PSA, MEDICARE: PSA: 1.57 ng/ml (ref 0.10–4.00)

## 2018-12-04 LAB — URINALYSIS, ROUTINE W REFLEX MICROSCOPIC
Bilirubin, UA: NEGATIVE
Glucose, UA: NEGATIVE
Ketones, UA: NEGATIVE
Leukocytes, UA: NEGATIVE
Nitrite, UA: NEGATIVE
Protein, UA: NEGATIVE
RBC, UA: NEGATIVE
Specific Gravity, UA: <=1.005 — AB (ref 1.005–1.030)
Urobilinogen, Ur: 0.2 mg/dL (ref 0.2–1.0)
pH, UA: 7 (ref 5.0–7.5)

## 2018-12-04 LAB — MEASLES/MUMPS/RUBELLA IMMUNITY
Mumps IgG: >300 AU/mL
Rubella: 24.5 index
Rubeola IgG: >300 AU/mL

## 2018-12-09 ENCOUNTER — Other Ambulatory Visit: Payer: Self-pay | Admitting: Student

## 2018-12-10 ENCOUNTER — Ambulatory Visit (INDEPENDENT_AMBULATORY_CARE_PROVIDER_SITE_OTHER): Payer: Medicare Other | Admitting: Internal Medicine

## 2018-12-10 ENCOUNTER — Encounter: Payer: Self-pay | Admitting: Internal Medicine

## 2018-12-10 VITALS — BP 110/58 | HR 71 | Temp 98.1°F | Ht 70.5 in | Wt 208.0 lb

## 2018-12-10 DIAGNOSIS — I2510 Atherosclerotic heart disease of native coronary artery without angina pectoris: Secondary | ICD-10-CM | POA: Diagnosis not present

## 2018-12-10 DIAGNOSIS — K760 Fatty (change of) liver, not elsewhere classified: Secondary | ICD-10-CM

## 2018-12-10 DIAGNOSIS — I10 Essential (primary) hypertension: Secondary | ICD-10-CM

## 2018-12-10 DIAGNOSIS — D696 Thrombocytopenia, unspecified: Secondary | ICD-10-CM | POA: Diagnosis not present

## 2018-12-10 DIAGNOSIS — E785 Hyperlipidemia, unspecified: Secondary | ICD-10-CM

## 2018-12-10 NOTE — Progress Notes (Signed)
Pre visit review using our clinic review tool, if applicable. No additional management support is needed unless otherwise documented below in the visit note. 

## 2018-12-10 NOTE — Progress Notes (Addendum)
Chief Complaint  Patient presents with  . Follow-up   F/u  1. HTN/CAD controlled on meds no chest pain  2. Fatty liver twinrix  3. Thrombocytopenia chronic x years pt wants to wait if low again before hematology on plavix and drinking 3 beers qd   Review of Systems  Constitutional: Negative for weight loss.  HENT: Negative for hearing loss.   Eyes: Negative for blurred vision.  Respiratory: Negative for shortness of breath.   Cardiovascular: Negative for chest pain.  Gastrointestinal: Negative for abdominal pain.  Musculoskeletal: Negative for falls.  Skin: Negative for rash.  Neurological: Negative for headaches.  Psychiatric/Behavioral: Negative for depression.   Past Medical History:  Diagnosis Date  . CAD (coronary artery disease)    a. s/p CABG in 2000 with LIMA-LAD, RIMA-PDA, SVG-D1, and SVG-PLA b. low-risk NST in 2016  . Cancer (HCC)    skin cancer follows Dr. Kellie Moor   . ED (erectile dysfunction)   . GERD (gastroesophageal reflux disease)   . Hyperlipidemia   . Hypertension    Followed by Dr. Claiborne Billings  . OSA on CPAP   . Sinus bradycardia   . Thrombocytopenia ([place])    Past Surgical History:  Procedure Laterality Date  . CATARACT EXTRACTION, BILATERAL  2010  . CORONARY ARTERY BYPASS GRAFT  2000   LIMA to the LAD,RIMA to the PDA,vein graft to the diagonal & vein graft to the PLA  . KNEE DEBRIDEMENT  1989   Fluid flush  . SHOULDER SURGERY  2010   bone spur   . US ECHOCARDIOGRAPHY  12/12/2010   mild LA dilatation, normal LV systolic fx, EF > 03%   Family History  Problem Relation Age of Onset  . Arthritis Mother   . Heart disease Father        CAD - MI  . Stroke Father   . Hypertension Father   . Cancer Father 69       esophageal cancer   . Arthritis Maternal Grandmother    Social History   Socioeconomic History  . Marital status: Married    Spouse name: Not on file  . Number of children: 3  . Years of education: 57  . Highest education level: Not on  file  Occupational History  . Occupation: Retired Corporate treasurer  . Occupation: Management/Technical Support    Comment: Runner, broadcasting/film/video  . Financial resource strain: Not on file  . Food insecurity:    Worry: Not on file    Inability: Not on file  . Transportation needs:    Medical: Not on file    Non-medical: Not on file  Tobacco Use  . Smoking status: Current Every Day Smoker    Packs/day: 1.00    Years: 44.00    Pack years: 44.00    Types: Cigarettes  . Smokeless tobacco: Never Used  . Tobacco comment: on and off smoker   Substance and Sexual Activity  . Alcohol use: Yes    Alcohol/week: 10.0 standard drinks    Types: 10 Cans of beer per week    Comment: Down to 3 beers a day from 4-6 a day   . Drug use: No  . Sexual activity: Yes    Partners: Female    Comment: Wife  Lifestyle  . Physical activity:    Days per week: Not on file    Minutes per session: Not on file  . Stress: Not on file  Relationships  . Social connections:    Talks  on phone: Not on file    Gets together: Not on file    Attends religious service: Not on file    Active member of club or organization: Not on file    Attends meetings of clubs or organizations: Not on file    Relationship status: Not on file  . Intimate partner violence:    Fear of current or ex partner: Not on file    Emotionally abused: Not on file    Physically abused: Not on file    Forced sexual activity: Not on file  Other Topics Concern  . Not on file  Social History Narrative   Christian Cole grew up in [place]. He is currently living in [place] with his wife. This is his second marriage. He has 1 daughter and 2 sons from his first marriage. He has 4 step kids. His daughter lives in [place], one son in [place], [place] and the other son in [place], [place]. 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 He enjoys wood working on his  spare time. 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       2 brothers and 2 sisters he is next to youngest    Current Meds  Medication Sig  . alprostadil (EDEX) 40 MCG injection Inject 25 mcg as directed.  Use no more than 2 times per week  . atorvastatin (LIPITOR) 40 MG tablet Take 1 tablet (40 mg total) by mouth daily at 6 PM. KEEP OV.  . benazepril (LOTENSIN) 40 MG tablet TAKE 1 TABLET DAILY  . clopidogrel (PLAVIX) 75 MG tablet TAKE 1 TABLET DAILY  . ezetimibe (ZETIA) 10 MG tablet Take 1 tablet (10 mg total) by mouth daily.  . pantoprazole (PROTONIX) 40 MG tablet take 1 tablet by mouth once daily 30 minutes BEFORE A MEAL   No Known Allergies Recent Results (from the past 2160 hour(s))  PSA, Medicare     Status: None   Collection Time: 12/03/18  9:08 AM  Result Value Ref Range   PSA 1.57 0.10 - 4.00 ng/ml    Comment: Test performed using Access Hybritech PSA Assay, a parmagnetic partical, chemiluminecent immunoassay.  Hemoglobin A1c     Status: None   Collection Time: 12/03/18  9:08 AM  Result Value Ref Range   Hgb A1c MFr Bld 6.0 4.6 - 6.5 %    Comment: Glycemic Control Guidelines for People with Diabetes:Non Diabetic:  <6%Goal of Therapy: <7%Additional Action Suggested:  >8%   Vitamin D (25 hydroxy)     Status: Abnormal   Collection Time: 12/03/18  9:08 AM  Result Value Ref Range   VITD 26.82 (L) 30.00 - 100.00 ng/mL  TSH     Status: None   Collection Time: 12/03/18  9:08 AM  Result Value Ref Range   TSH 1.43 0.35 - 4.50 uIU/mL  Lipid panel     Status: None   Collection Time: 12/03/18  9:08 AM  Result Value Ref Range   Cholesterol 145 0 - 200 mg/dL    Comment: ATP III Classification       Desirable:  < 200 mg/dL               Borderline High:  200 - 239 mg/dL          High:  > = 240  mg/dL   Triglycerides 113.0 0.0 - 149.0 mg/dL    Comment: Normal:  <150 mg/dLBorderline High:  150 -  199 mg/dL   HDL 59.10 >39.00 mg/dL   VLDL 22.6 0.0 - 40.0 mg/dL   LDL Cholesterol 63 0 - 99 mg/dL   Total CHOL/HDL Ratio 2     Comment:                Men          Women1/2 Average Risk     3.4          3.3Average Risk          5.0          4.42X Average Risk          9.6          7.13X Average Risk          15.0          11.0                       NonHDL 85.63     Comment: NOTE:  Non-HDL goal should be 30 mg/dL higher than patient's LDL goal (i.e. LDL goal of < 70 mg/dL, would have non-HDL goal of < 100 mg/dL)  CBC with Differential/Platelet     Status: Abnormal   Collection Time: 12/03/18  9:08 AM  Result Value Ref Range   WBC 8.3 4.0 - 10.5 K/uL   RBC 4.80 4.22 - 5.81 Mil/uL   Hemoglobin 15.6 13.0 - 17.0 g/dL   HCT 45.2 39.0 - 52.0 %   MCV 94.2 78.0 - 100.0 fl   MCHC 34.5 30.0 - 36.0 g/dL   RDW 13.6 11.5 - 15.5 %   Platelets 138.0 (L) 150.0 - 400.0 K/uL   Neutrophils Relative % 54.3 43.0 - 77.0 %   Lymphocytes Relative 35.8 12.0 - 46.0 %   Monocytes Relative 8.1 3.0 - 12.0 %   Eosinophils Relative 1.5 0.0 - 5.0 %   Basophils Relative 0.3 0.0 - 3.0 %   Neutro Abs 4.5 1.4 - 7.7 K/uL   Lymphs Abs 3.0 0.7 - 4.0 K/uL   Monocytes Absolute 0.7 0.1 - 1.0 K/uL   Eosinophils Absolute 0.1 0.0 - 0.7 K/uL   Basophils Absolute 0.0 0.0 - 0.1 K/uL  Comprehensive metabolic panel     Status: None   Collection Time: 12/03/18  9:08 AM  Result Value Ref Range   Sodium 138 135 - 145 mEq/L   Potassium 4.5 3.5 - 5.1 mEq/L   Chloride 101 96 - 112 mEq/L   CO2 27 19 - 32 mEq/L   Glucose, Bld 91 70 - 99 mg/dL   BUN 12 6 - 23 mg/dL   Creatinine, Ser 0.96 0.40 - 1.50 mg/dL   Total Bilirubin 0.8 0.2 - 1.2 mg/dL   Alkaline Phosphatase 81 39 - 117 U/L   AST 18 0 - 37 U/L   ALT 16 0 - 53 U/L   Total Protein 7.5 6.0 - 8.3 g/dL   Albumin 4.7 3.5 - 5.2 g/dL   Calcium 10.0 8.4 - 10.5 mg/dL   GFR 77.70 >60.00 mL/min  Measles/Mumps/Rubella Immunity     Status: None   Collection Time: 12/03/18  9:08 AM   Result Value Ref Range   Rubeola IgG >300.00 AU/mL    Comment: AU/mL            Interpretation -----            -------------- <  13.50           Negative 13.50-16.49      Equivocal >16.49           Positive . A positive result indicates that the patient has antibody to measles virus. It does not differentiate  between an active or past infection. The clinical  diagnosis must be interpreted in conjunction with  clinical signs and symptoms of the patient.    Mumps IgG >300.00 AU/mL    Comment:  AU/mL           Interpretation -------         ---------------- <9.00             Negative 9.00-10.99        Equivocal >10.99            Positive A positive result indicates that the patient has  antibody to mumps virus. It does not differentiate between an  active or past infection. The clinical diagnosis must be interpreted in conjunction with clinical signs and symptoms of the patient. .    Rubella 24.50 index    Comment:     Index            Interpretation     -----            --------------       <0.90            Not consistent with Immunity     0.90-0.99        Equivocal     > or = 1.00      Consistent with Immunity  . The presence of rubella IgG antibody suggests  immunization or past or current infection with rubella virus.   Urinalysis, Routine w reflex microscopic     Status: Abnormal   Collection Time: 12/03/18  9:08 AM  Result Value Ref Range   Specific Gravity, UA      <=1.005 (A) 1.005 - 1.030   pH, UA 7.0 5.0 - 7.5   Color, UA Yellow Yellow   Appearance Ur Clear Clear   Leukocytes, UA Negative Negative   Protein, UA Negative Negative/Trace   Glucose, UA Negative Negative   Ketones, UA Negative Negative   RBC, UA Negative Negative   Bilirubin, UA Negative Negative   Urobilinogen, Ur 0.2 0.2 - 1.0 mg/dL   Nitrite, UA Negative Negative   Microscopic Examination Comment     Comment: Microscopic not indicated and not performed.   Objective  Body mass index is  29.42 kg/m. Wt Readings from Last 3 Encounters:  12/10/18 208 lb (94.3 kg)  10/21/18 205 lb 3.2 oz (93.1 kg)  07/17/18 207 lb (93.9 kg)   Temp Readings from Last 3 Encounters:  12/10/18 98.1 F (36.7 C) (Oral)  07/17/18 97.7 F (36.5 C) (Oral)  07/15/18 98.8 F (37.1 C) (Oral)   BP Readings from Last 3 Encounters:  12/10/18 (!) 110/58  10/21/18 134/78  07/17/18 140/78   Pulse Readings from Last 3 Encounters:  12/10/18 71  10/21/18 (!) 56  07/17/18 80    Physical Exam Vitals signs and nursing note reviewed.  Constitutional:      Appearance: Normal appearance. He is well-developed and well-groomed.  HENT:     Head: Normocephalic and atraumatic.     Nose: Nose normal.     Mouth/Throat:     Mouth: Mucous membranes are moist.     Pharynx: Oropharynx is clear.  Eyes:     Conjunctiva/sclera:  Conjunctivae normal.     Pupils: Pupils are equal, round, and reactive to light.  Cardiovascular:     Rate and Rhythm: Normal rate and regular rhythm.     Heart sounds: Normal heart sounds.  Pulmonary:     Effort: Pulmonary effort is normal.     Breath sounds: Normal breath sounds.  Skin:    General: Skin is warm and dry.  Neurological:     General: No focal deficit present.     Mental Status: He is alert and oriented to person, place, and time. Mental status is at baseline.     Gait: Gait normal.  Psychiatric:        Attention and Perception: Attention and perception normal.        Mood and Affect: Mood and affect normal.        Speech: Speech normal.        Behavior: Behavior normal. Behavior is cooperative.        Thought Content: Thought content normal.        Cognition and Memory: Cognition and memory normal.        Judgment: Judgment normal.     Assessment   1. HTN/CAD 2. Fatty liver  3. Thrombocytopenia  4. HM Plan   1. Cont meds doing well Pending US carotid and stress test  2. rec twinrix  3. Check cbc smear in 6 months consider hematology if low again per  pt  4.  Declinedflu shot Had prevnar, zostervax,utdpna 23 vaccine Disc shingrix for futureand Tdap given Rx today Disc hep A/B vaccine today given Rx today   HadPSAnl 12/03/18 Hep C neg  CT chestdue 12/31/2018 rec smoking cessation as abovedue for repeat  -rec smoking cessation wants to try wellbutrin in future will let me know   US abdomen1/29/19 neg normal spleen size, +fatty liver, 1.3 cm right kidney cyst Colonoscopy had 02/16/14 tubular adenoma h/o polyps repeat in 5 years Sees derm Dr. Kellie Moor saw 2019 SCC left arm x 2 f/u 03/2019   Feeling great f/u OSA Dr. Patrick North [phone] fax [phone] benefits CPAP f/u in 1 year   Provider: Dr. [name] [name]

## 2018-12-10 NOTE — Patient Instructions (Addendum)
Call [facility] GI colonoscopy due 02/15/2019  [place], [place] [address]  [phone]  [phone] (Fax)  Consider twinrix in future can call here to schedule if needed    Nonalcoholic Fatty Liver Disease Diet Nonalcoholic fatty liver disease is a condition that causes fat to accumulate in and around the liver. The disease makes it harder for the liver to work the way that it should. Following a healthy diet can help to keep nonalcoholic fatty liver disease under control. It can also help to prevent or improve conditions that are associated with the disease, such as heart disease, diabetes, high blood pressure, and abnormal cholesterol levels. Along with regular exercise, this diet:  Promotes weight loss.  Helps to control blood sugar levels.  Helps to improve the way that the body uses insulin. What do I need to know about this diet?  Use the glycemic index (GI) to plan your meals. The index tells you how quickly a food will raise your blood sugar. Choose low-GI foods. These foods take a longer time to raise blood sugar.  Keep track of how many calories you take in. Eating the right amount of calories will help you to achieve a healthy weight.  You may want to follow a Mediterranean diet. This diet includes a lot of vegetables, lean meats or fish, whole grains, fruits, and healthy oils and fats. What foods can I eat? Grains Whole grains, such as whole-wheat or whole-grain breads, crackers, tortillas, cereals, and pasta. Stone-ground whole wheat. Pumpernickel bread. Unsweetened oatmeal. Bulgur. Barley. Quinoa. Brown or wild rice. Corn or whole-wheat flour tortillas. Vegetables Lettuce. Spinach. Peas. Beets. Cauliflower. Cabbage. Broccoli. Carrots. Tomatoes. Squash. Eggplant. Herbs. Peppers. Onions. Cucumbers. Brussels sprouts. Yams and sweet potatoes. Beans. Lentils. Fruits Bananas. Apples. Oranges. Grapes. Papaya. Mango. Pomegranate. Kiwi.  Grapefruit. Cherries. Meats and Other Protein Sources Seafood and shellfish. Lean meats. Poultry. Tofu. Dairy Low-fat or fat-free dairy products, such as yogurt, cottage cheese, and cheese. Beverages Water. Sugar-free drinks. Tea. Coffee. Low-fat or skim milk. Milk alternatives, such as soy or almond milk. Real fruit juice. Condiments Mustard. Relish. Low-fat, low-sugar ketchup and barbecue sauce. Low-fat or fat-free mayonnaise. Sweets and Desserts Sugar-free sweets. Fats and Oils Avocado. Canola or olive oil. Nuts and nut butters. Seeds. The items listed above may not be a complete list of recommended foods or beverages. Contact your dietitian for more options. What foods are not recommended? Palm oil and coconut oil. Processed foods. Fried foods. Sweetened drinks, such as sweet tea, milkshakes, snow cones, iced sweet drinks, and sodas. Alcohol. Sweets. Foods that contain a lot of salt or sodium. The items listed above may not be a complete list of foods and beverages to avoid. Contact your dietitian for more information. This information is not intended to replace advice given to you by your health care provider. Make sure you discuss any questions you have with your health care provider. Document Released: 03/15/2015 Document Revised: 04/05/2016 Document Reviewed: 11/23/2014 Elsevier Interactive Patient Education  2019 Elsevier Inc.  Fatty Liver Disease  Fatty liver disease occurs when too much fat has built up in your liver cells. Fatty liver disease is also called hepatic steatosis or steatohepatitis. The liver removes harmful substances from your bloodstream and produces fluids that your body needs. It also helps your body use and store energy from the food you eat. In many cases, fatty liver disease does not cause symptoms or problems. It is often diagnosed when tests are  being done for other reasons. However, over time, fatty liver can cause inflammation that may lead to more serious  liver problems, such as scarring of the liver (cirrhosis) and liver failure. Fatty liver is associated with insulin resistance, increased body fat, high blood pressure (hypertension), and high cholesterol. These are features of metabolic syndrome and increase your risk for stroke, diabetes, and heart disease. What are the causes? This condition may be caused by:  Drinking too much alcohol.  Poor nutrition.  Obesity.  Cushing's syndrome.  Diabetes.  High cholesterol.  Certain drugs.  Poisons.  Some viral infections.  Pregnancy. What increases the risk? You are more likely to develop this condition if you:  Abuse alcohol.  Are overweight.  Have diabetes.  Have hepatitis.  Have a high triglyceride level.  Are pregnant. What are the signs or symptoms? Fatty liver disease often does not cause symptoms. If symptoms do develop, they can include:  Fatigue.  Weakness.  Weight loss.  Confusion.  Abdominal pain.  Nausea and vomiting.  Yellowing of your skin and the white parts of your eyes (jaundice).  Itchy skin. How is this diagnosed? This condition may be diagnosed by:  A physical exam and medical history.  Blood tests.  Imaging tests, such as an ultrasound, CT scan, or MRI.  A liver biopsy. A small sample of liver tissue is removed using a needle. The sample is then looked at under a microscope. How is this treated? Fatty liver disease is often caused by other health conditions. Treatment for fatty liver may involve medicines and lifestyle changes to manage conditions such as:  Alcoholism.  High cholesterol.  Diabetes.  Being overweight or obese. Follow these instructions at home:   Do not drink alcohol. If you have trouble quitting, ask your health care provider how to safely quit with the help of medicine or a supervised program. This is important to keep your condition from getting worse.  Eat a healthy diet as told by your health care  provider. Ask your health care provider about working with a diet and nutrition specialist (dietitian) to develop an eating plan.  Exercise regularly. This can help you lose weight and control your cholesterol and diabetes. Talk to your health care provider about an exercise plan and which activities are best for you.  Take over-the-counter and prescription medicines only as told by your health care provider.  Keep all follow-up visits as told by your health care provider. This is important. Contact a health care provider if: You have trouble controlling your:  Blood sugar. This is especially important if you have diabetes.  Cholesterol.  Drinking of alcohol. Get help right away if:  You have abdominal pain.  You have jaundice.  You have nausea and vomiting.  You vomit blood or material that looks like coffee grounds.  You have stools that are black, tar-like, or bloody. Summary  Fatty liver disease develops when too much fat builds up in the cells of your liver.  Fatty liver disease often causes no symptoms or problems. However, over time, fatty liver can cause inflammation that may lead to more serious liver problems, such as scarring of the liver (cirrhosis).  You are more likely to develop this condition if you abuse alcohol, are pregnant, are overweight, have diabetes, have hepatitis, or have high triglyceride levels.  Contact your health care provider if you have trouble controlling your weight, blood sugar, cholesterol, or drinking of alcohol. This information is not intended to replace advice given  to you by your health care provider. Make sure you discuss any questions you have with your health care provider. Document Released: 12/14/2005 Document Revised: 08/07/2017 Document Reviewed: 08/07/2017 Elsevier Interactive Patient Education  2019 [employer].

## 2019-01-02 ENCOUNTER — Telehealth: Payer: Self-pay | Admitting: *Deleted

## 2019-01-02 ENCOUNTER — Telehealth: Payer: Self-pay

## 2019-01-02 DIAGNOSIS — Z122 Encounter for screening for malignant neoplasm of respiratory organs: Secondary | ICD-10-CM

## 2019-01-02 NOTE — Telephone Encounter (Signed)
Patient has been notified that the annual lung cancer screening low dose CT scan is due currently or will be in the near future.  Confirmed that the patient is within the age range of 48-80, and asymptomatic, and currently exhibits no signs or symptoms of lung cancer.  Patient denies illness that would prevent curative treatment for lung cancer if found.  Verified smoking history, current smoker 0.5 ppd with [id number] hx   The shared decision making visit was completed on 12-31-17.  Patient is agreeable for the CT scan to be scheduled.  Will call patient back with date and time of appointment.

## 2019-01-02 NOTE — Telephone Encounter (Signed)
Call pt regarding lung screening. Pt is a current smoker, smoking about 1/2 pack per day. Pt would like scan in the morning. Pt denies any new health issues at this time.

## 2019-01-05 ENCOUNTER — Telehealth: Payer: Self-pay | Admitting: *Deleted

## 2019-01-05 NOTE — Telephone Encounter (Signed)
Called pt to inform him of his appt for ldct screening onFriday 01/09/2019 here @ [facility] @ 9:15am, message left for patient.

## 2019-01-07 ENCOUNTER — Encounter: Payer: Self-pay | Admitting: *Deleted

## 2019-01-09 ENCOUNTER — Ambulatory Visit
Admission: RE | Admit: 2019-01-09 | Discharge: 2019-01-09 | Disposition: A | Discharge status: Home / Self Care | Payer: Medicare Other | Source: Ambulatory Visit | Attending: Nurse Practitioner | Admitting: Nurse Practitioner

## 2019-01-09 DIAGNOSIS — Z87891 Personal history of nicotine dependence: Secondary | ICD-10-CM | POA: Diagnosis not present

## 2019-01-09 DIAGNOSIS — F17210 Nicotine dependence, cigarettes, uncomplicated: Secondary | ICD-10-CM | POA: Insufficient documentation

## 2019-01-09 DIAGNOSIS — Z122 Encounter for screening for malignant neoplasm of respiratory organs: Secondary | ICD-10-CM | POA: Diagnosis not present

## 2019-01-11 ENCOUNTER — Other Ambulatory Visit: Payer: Self-pay | Admitting: Internal Medicine

## 2019-01-11 DIAGNOSIS — I10 Essential (primary) hypertension: Secondary | ICD-10-CM

## 2019-01-11 MED ORDER — AMLODIPINE BESYLATE 5 MG PO TABS: 5.0000 mg | ORAL_TABLET | Freq: Every day | ORAL | 3 refills | Status: DC

## 2019-01-12 ENCOUNTER — Encounter: Payer: Self-pay | Admitting: *Deleted

## 2019-01-14 ENCOUNTER — Ambulatory Visit: Payer: Self-pay | Admitting: Surgery

## 2019-01-15 ENCOUNTER — Other Ambulatory Visit: Payer: Self-pay | Admitting: Student

## 2019-01-28 ENCOUNTER — Telehealth: Payer: Self-pay

## 2019-01-28 NOTE — Telephone Encounter (Signed)
Results released to pt through mychart have been placed to be mailed to the patient.

## 2019-02-24 ENCOUNTER — Other Ambulatory Visit: Payer: Self-pay | Admitting: Internal Medicine

## 2019-02-24 DIAGNOSIS — K219 Gastro-esophageal reflux disease without esophagitis: Secondary | ICD-10-CM

## 2019-02-24 MED ORDER — PANTOPRAZOLE SODIUM 40 MG PO TBEC: DELAYED_RELEASE_TABLET | ORAL | 3 refills | Status: DC

## 2019-03-05 ENCOUNTER — Telehealth: Payer: Self-pay | Admitting: Urology

## 2019-03-05 ENCOUNTER — Telehealth (INDEPENDENT_AMBULATORY_CARE_PROVIDER_SITE_OTHER): Payer: Medicare Other | Admitting: Urology

## 2019-03-05 ENCOUNTER — Other Ambulatory Visit: Payer: Self-pay

## 2019-03-05 DIAGNOSIS — N5201 Erectile dysfunction due to arterial insufficiency: Secondary | ICD-10-CM

## 2019-03-05 NOTE — Telephone Encounter (Signed)
LMOM for pt to call office to schedule 1 yr follow up

## 2019-03-05 NOTE — Progress Notes (Signed)
Virtual Visit via Video Note  I connected with Christian Cole on 03/05/19 at  9:00 AM EDT by a video enabled telemedicine application and verified that I am speaking with the correct person using two identifiers.   I discussed the limitations of evaluation and management by telemedicine and the availability of in person appointments. The patient expressed understanding and agreed to proceed.  History of Present Illness: 68 year old male presents for annual follow-up of erectile dysfunction via video due to COVID-19 pandemic.  He was initially seen in April 2019 for severe erectile dysfunction.  He had failed PDE 5 inhibitors.  Organic risk factors included coronary artery disease, hypertension, hyperlipidemia, antihypertensive medication and tobacco use.  He was started on intracavernosal injections.  He is currently using Edex at 40 mcg and states this gives a partial erection though not satisfactory.  He is interested in pursuing a penile prosthesis.   Observations/Objective: Alert, in no acute distress  Assessment and Plan: 69 year old male with ED who has failed PDE 5 inhibitors and intracavernosal injections.  He is interested in penile implant surgery.  I discussed referral to 1 of my partners or Dr. Francesca Jewett at [facility].  He would like to see Dr. Francesca Jewett and a referral was entered.  I also discussed other options of a trial of Trimix versus using Edex with a venous compression band.  Follow Up Instructions: 1.  Referral entered to [facility] urology per patient's request 2.  Annual follow-up with me   I discussed the assessment and treatment plan with the patient. The patient was provided an opportunity to ask questions and all were answered. The patient agreed with the plan and demonstrated an understanding of the instructions.   The patient was advised to call back or seek an in-person evaluation if the symptoms worsen or if the condition fails to improve as anticipated.  I provided 10  minutes of non-face-to-face time during this encounter.   Abbie Sons, MD

## 2019-04-23 ENCOUNTER — Encounter (HOSPITAL_COMMUNITY): Payer: Self-pay

## 2019-04-23 ENCOUNTER — Other Ambulatory Visit: Payer: Self-pay

## 2019-04-27 ENCOUNTER — Encounter: Payer: Self-pay | Admitting: *Deleted

## 2019-04-27 ENCOUNTER — Other Ambulatory Visit: Payer: Self-pay | Admitting: Student

## 2019-04-27 NOTE — Telephone Encounter (Signed)
Refill Request.  

## 2019-04-29 ENCOUNTER — Ambulatory Visit (INDEPENDENT_AMBULATORY_CARE_PROVIDER_SITE_OTHER): Payer: Medicare Other

## 2019-04-29 ENCOUNTER — Other Ambulatory Visit: Payer: Self-pay

## 2019-04-29 DIAGNOSIS — R0989 Other specified symptoms and signs involving the circulatory and respiratory systems: Secondary | ICD-10-CM

## 2019-05-04 DIAGNOSIS — G4733 Obstructive sleep apnea (adult) (pediatric): Secondary | ICD-10-CM | POA: Diagnosis not present

## 2019-05-04 DIAGNOSIS — I259 Chronic ischemic heart disease, unspecified: Secondary | ICD-10-CM | POA: Diagnosis not present

## 2019-05-07 DIAGNOSIS — N529 Male erectile dysfunction, unspecified: Secondary | ICD-10-CM | POA: Diagnosis not present

## 2019-06-01 ENCOUNTER — Telehealth: Payer: Self-pay | Admitting: Cardiovascular Disease

## 2019-06-01 NOTE — Telephone Encounter (Signed)
   Primary Cardiologist:Thomas Claiborne Billings, MD  Chart reviewed as part of pre-operative protocol coverage. Because of Christian Cole's past medical history and time since last visit, he/she will require a follow-up visit in order to better assess preoperative cardiovascular risk.  Pre-op covering staff: - Please schedule appointment and call patient to inform them. - Please contact requesting surgeon's office via preferred method (i.e, phone, fax) to inform them of need for appointment prior to surgery.  If applicable, this message will also be routed to pharmacy pool and/or primary cardiologist for input on holding anticoagulant/antiplatelet agent as requested below so that this information is available at time of patient's appointment.   Kathyrn Drown, NP  06/01/2019, 10:22 AM

## 2019-06-01 NOTE — Telephone Encounter (Signed)
Pt is scheduled to see Almyra Deforest, PA on 06/09/2019 for clearance

## 2019-06-01 NOTE — Telephone Encounter (Signed)
New Message         [facility] Pre-operative Risk Assessment    Request for surgical clearance:  1. What type of surgery is being performed? Insertion of inflatable penile prosthesis  2. When is this surgery scheduled? 06/26/19  3. What type of clearance is required (medical clearance vs. Pharmacy clearance to hold med vs. Both)? BOTH   4. Are there any medications that need to be held prior to surgery and how long? Plavix - tbd   5. Practice name and name of physician performing surgery? [facility] Urology Dr Francesca Jewett   6. What is your office phone number [phone]    7.   What is your office fax number [phone]  8.   Anesthesia type (None, local, MAC, general) ? General    Marca Ancona 06/01/2019, 10:01 AM  _________________________________________________________________   (provider comments below)

## 2019-06-02 ENCOUNTER — Other Ambulatory Visit: Payer: Self-pay

## 2019-06-02 DIAGNOSIS — I2510 Atherosclerotic heart disease of native coronary artery without angina pectoris: Secondary | ICD-10-CM

## 2019-06-03 ENCOUNTER — Encounter
Admission: RE | Admit: 2019-06-03 | Discharge: 2019-06-03 | Disposition: A | Discharge status: Home / Self Care | Payer: Medicare Other | Source: Ambulatory Visit | Attending: Cardiovascular Disease | Admitting: Cardiovascular Disease

## 2019-06-03 ENCOUNTER — Other Ambulatory Visit: Payer: Self-pay

## 2019-06-03 DIAGNOSIS — I2510 Atherosclerotic heart disease of native coronary artery without angina pectoris: Secondary | ICD-10-CM | POA: Diagnosis not present

## 2019-06-03 LAB — NM MYOCAR MULTI W/SPECT W/WALL MOTION / EF
Estimated workload: 1 METS
Exercise duration (min): 0 min
Exercise duration (sec): 0 s
LV sys vol: 55 mL
MPHR: 151 {beats}/min
Peak HR: 83 {beats}/min
Percent HR: 54 %
Rest HR: 48 {beats}/min
TID: 1.09

## 2019-06-03 MED ORDER — TECHNETIUM TC 99M TETROFOSMIN IV KIT
11.0400 | PACK | Freq: Once | INTRAVENOUS | Status: AC | PRN
  Administered 2019-06-03: 11.04 via INTRAVENOUS

## 2019-06-03 MED ORDER — TECHNETIUM TC 99M TETROFOSMIN IV KIT
31.5500 | PACK | Freq: Once | INTRAVENOUS | Status: AC | PRN
  Administered 2019-06-03: 31.55 via INTRAVENOUS

## 2019-06-03 MED ORDER — REGADENOSON 0.4 MG/5ML IV SOLN
0.4000 mg | Freq: Once | INTRAVENOUS | Status: AC
  Administered 2019-06-03: 0.4 mg via INTRAVENOUS

## 2019-06-04 DIAGNOSIS — Z01818 Encounter for other preprocedural examination: Secondary | ICD-10-CM | POA: Diagnosis not present

## 2019-06-04 DIAGNOSIS — N529 Male erectile dysfunction, unspecified: Secondary | ICD-10-CM | POA: Diagnosis not present

## 2019-06-08 ENCOUNTER — Other Ambulatory Visit (INDEPENDENT_AMBULATORY_CARE_PROVIDER_SITE_OTHER): Payer: Medicare Other

## 2019-06-08 ENCOUNTER — Other Ambulatory Visit: Payer: Self-pay

## 2019-06-08 DIAGNOSIS — D696 Thrombocytopenia, unspecified: Secondary | ICD-10-CM | POA: Diagnosis not present

## 2019-06-08 DIAGNOSIS — I10 Essential (primary) hypertension: Secondary | ICD-10-CM

## 2019-06-08 LAB — COMPREHENSIVE METABOLIC PANEL
ALT: 23 U/L (ref 0–53)
AST: 23 U/L (ref 0–37)
Albumin: 4.7 g/dL (ref 3.5–5.2)
Alkaline Phosphatase: 101 U/L (ref 39–117)
BUN: 18 mg/dL (ref 6–23)
CO2: 27 mEq/L (ref 19–32)
Calcium: 9.6 mg/dL (ref 8.4–10.5)
Chloride: 102 mEq/L (ref 96–112)
Creatinine, Ser: 1.04 mg/dL (ref 0.40–1.50)
GFR: 70.74 mL/min (ref >60.00)
Glucose, Bld: 105 mg/dL — ABNORMAL HIGH (ref 70–99)
Potassium: 4.6 mEq/L (ref 3.5–5.1)
Sodium: 138 mEq/L (ref 135–145)
Total Bilirubin: 0.6 mg/dL (ref 0.2–1.2)
Total Protein: 7.1 g/dL (ref 6.0–8.3)

## 2019-06-09 ENCOUNTER — Encounter: Payer: Self-pay | Admitting: Physician Assistant

## 2019-06-09 ENCOUNTER — Ambulatory Visit (INDEPENDENT_AMBULATORY_CARE_PROVIDER_SITE_OTHER): Payer: Medicare Other | Admitting: Physician Assistant

## 2019-06-09 VITALS — BP 130/66 | HR 49 | Temp 97.0°F | Ht 71.0 in | Wt 201.5 lb

## 2019-06-09 DIAGNOSIS — G4733 Obstructive sleep apnea (adult) (pediatric): Secondary | ICD-10-CM

## 2019-06-09 DIAGNOSIS — E785 Hyperlipidemia, unspecified: Secondary | ICD-10-CM

## 2019-06-09 DIAGNOSIS — Z9989 Dependence on other enabling machines and devices: Secondary | ICD-10-CM | POA: Diagnosis not present

## 2019-06-09 DIAGNOSIS — Z01810 Encounter for preprocedural cardiovascular examination: Secondary | ICD-10-CM

## 2019-06-09 DIAGNOSIS — R001 Bradycardia, unspecified: Secondary | ICD-10-CM | POA: Diagnosis not present

## 2019-06-09 DIAGNOSIS — I10 Essential (primary) hypertension: Secondary | ICD-10-CM | POA: Diagnosis not present

## 2019-06-09 DIAGNOSIS — I25810 Atherosclerosis of coronary artery bypass graft(s) without angina pectoris: Secondary | ICD-10-CM | POA: Diagnosis not present

## 2019-06-09 LAB — CBC WITH DIFFERENTIAL/PLATELET
Absolute Monocytes: 748 cells/uL (ref 200–950)
Basophils Absolute: 60 cells/uL (ref 0–200)
Basophils Relative: 0.7 %
Eosinophils Absolute: 162 cells/uL (ref 15–500)
Eosinophils Relative: 1.9 %
HCT: 43.8 % (ref 38.5–50.0)
Hemoglobin: 14.7 g/dL (ref 13.2–17.1)
Lymphs Abs: 3902 cells/uL — ABNORMAL HIGH (ref 850–3900)
MCH: 32.1 pg (ref 27.0–33.0)
MCHC: 33.6 g/dL (ref 32.0–36.0)
MCV: 95.6 fL (ref 80.0–100.0)
MPV: 11 fL (ref 7.5–12.5)
Monocytes Relative: 8.8 %
Neutro Abs: 3630 cells/uL (ref 1500–7800)
Neutrophils Relative %: 42.7 %
Platelets: 174 10*3/uL (ref 140–400)
RBC: 4.58 10*6/uL (ref 4.20–5.80)
RDW: 12.7 % (ref 11.0–15.0)
Total Lymphocyte: 45.9 %
WBC: 8.5 10*3/uL (ref 3.8–10.8)

## 2019-06-09 LAB — PATHOLOGIST SMEAR REVIEW

## 2019-06-09 NOTE — Patient Instructions (Addendum)
Medication Instructions:  Your physician recommends that you continue on your current medications as directed. Please refer to the Current Medication list given to you today.  If you need a refill on your cardiac medications before your next appointment, please call your pharmacy.   Lab work: NONE ordered at this time of appointment   If you have labs (blood work) drawn today and your tests are completely normal, you will receive your results only by: Marland Kitchen MyChart Message (if you have MyChart) OR . A paper copy in the mail If you have any lab test that is abnormal or we need to change your treatment, we will call you to review the results.  Testing/Procedures: NONE ordered at this time of appointment   Follow-Up: At [facility], you and your health needs are our priority.  As part of our continuing mission to provide you with exceptional heart care, we have created designated Provider Care Teams.  These Care Teams include your primary Cardiologist (physician) and Advanced Practice Providers (APPs -  Physician Assistants and Nurse Practitioners) who all work together to provide you with the care you need, when you need it. You will need a follow up appointment in 6 months.  Please call our office 2 months in advance to schedule this appointment.  You may see Shelva Majestic, MD or one of the following Advanced Practice Providers on your designated Care Team: [place], [place] . Fabian Sharp, PA-C  Any Other Special Instructions Will Be Listed Below (If Applicable). You are cleared for your surgery

## 2019-06-09 NOTE — Progress Notes (Signed)
   To whom it may concern:  Mr. Bonny Egger was seen in the [place] office on 06/09/2019, at which time, he was doing well from cardiac perspective. He had a negative stress test on 06/03/2019. Patient is cleared to proceed with surgery and may hold Plavix for 5-7 days prior to the procedure. He will need to restart Plavix as soon as possible after the procedure at the discretion of the surgeon.  Thank you  Almyra Deforest PA-C [place] Phone: [phone]

## 2019-06-09 NOTE — Progress Notes (Signed)
Cardiology Office Note    Date:  06/09/2019   ID:  Christian Cole 23-Mar-1951, MRN [id number]  PCP:  [name], Nino Glow, MD  Cardiologist:  Dr. Claiborne Billings   Chief Complaint  Patient presents with  . other    Surgical clearance for an IPP. Meds reviewed by the pt. verbally. "doing well."    History of Present Illness:  Christian Cole is a 68 y.o. male with PMH of CAD s/p CABG, hypertension, hyperlipidemia, and obstructive sleep apnea on CPAP therapy.  Patient underwent percutaneous intervention in 1998 of unknown vessel.  He had CABG x4 with LIMA to LAD, RIMA to PDA, SVG to diagonal and SVG to PLA in December 2000.  He had a history of vasodepressive syncope with positive tilt table test.  Echocardiogram in January 2002 showed normal systolic function.  Myoview in June 2016 showed EF 52%, negative for ischemia or infarction.  He was most recently evaluated by Dr. Claiborne Billings in December 2019 at which time he was compliant with his CPAP therapy and continue to be active.  Carotid Doppler obtained on 04/29/2019 was negative.  Repeat Myoview obtained on 06/03/2019 showed EF 55 to 65%, no ischemia or scar.  Patient presents today for cardiology office visit and also preoperative clearance prior to his insertion of inflatable penile prosthesis by [facility] urology Dr. Francesca Cole.  He denies any recent chest pain or shortness of breath.  He has no lower extremity edema, orthopnea or PND.  EKG today does show he has bradycardia with heart rate of 49 however he is completely asymptomatic without any dizziness, blurred vision or feeling of passing out.  He is not on any AV nodal blocking agent.  Previous lab work in January 2020 showed well-controlled cholesterol.  Overall, he is doing quite well from cardiology perspective and it may proceed with surgery from cardiology perspective.  Given the use of general anesthesia and baseline bradycardia, I do recommend monitor heart rate closely during the surgery.     Past Medical History:  Diagnosis Date  . CAD (coronary artery disease)    a. s/p CABG in 2000 with LIMA-LAD, RIMA-PDA, SVG-D1, and SVG-PLA b. low-risk NST in 2016  . Cancer (HCC)    skin cancer follows Dr. Kellie Moor   . ED (erectile dysfunction)   . GERD (gastroesophageal reflux disease)   . Hyperlipidemia   . Hypertension    Followed by Dr. Claiborne Billings  . OSA on CPAP   . Sinus bradycardia   . Thrombocytopenia ([place])     Past Surgical History:  Procedure Laterality Date  . CATARACT EXTRACTION, BILATERAL  2010  . CORONARY ARTERY BYPASS GRAFT  2000   LIMA to the LAD,RIMA to the PDA,vein graft to the diagonal & vein graft to the PLA  . KNEE DEBRIDEMENT  1989   Fluid flush  . SHOULDER SURGERY  2010   bone spur   . US ECHOCARDIOGRAPHY  12/12/2010   mild LA dilatation, normal LV systolic fx, EF > 56%    Current Medications: Outpatient Medications Prior to Visit  Medication Sig Dispense Refill  . alprostadil (EDEX) 40 MCG injection Inject 25 mcg as directed.  Use no more than 2 times per week 2 each 18  . amLODipine (NORVASC) 5 MG tablet Take 1 tablet (5 mg total) by mouth daily. 90 tablet 3  . atorvastatin (LIPITOR) 40 MG tablet TAKE 1 TABLET DAILY AT 6 P.M. (KEEP OFFICE VISIT) 90 tablet 2  . benazepril (LOTENSIN) 40 MG tablet  Take 1 tablet (40 mg total) by mouth daily. OFFICE VISIT NEEDED 90 tablet 0  . clopidogrel (PLAVIX) 75 MG tablet TAKE 1 TABLET DAILY 90 tablet 3  . ezetimibe (ZETIA) 10 MG tablet Take 1 tablet (10 mg total) by mouth daily. 90 tablet 2  . pantoprazole (PROTONIX) 40 MG tablet take 1 tablet by mouth once daily 30 minutes BEFORE A MEAL 90 tablet 3   No facility-administered medications prior to visit.      Allergies:   Patient has no known allergies.   Social History   Socioeconomic History  . Marital status: Married    Spouse name: Not on file  . Number of children: 3  . Years of education: 64  . Highest education level: Not on file  Occupational History   . Occupation: Retired Corporate treasurer  . Occupation: Management/Technical Support    Comment: Runner, broadcasting/film/video  . Financial resource strain: Not on file  . Food insecurity    Worry: Not on file    Inability: Not on file  . Transportation needs    Medical: Not on file    Non-medical: Not on file  Tobacco Use  . Smoking status: Current Every Day Smoker    Packs/day: 0.50    Years: 44.00    Pack years: 22.00    Types: Cigarettes  . Smokeless tobacco: Never Used  . Tobacco comment: on and off smoker   Substance and Sexual Activity  . Alcohol use: Yes    Alcohol/week: 10.0 standard drinks    Types: 10 Cans of beer per week    Comment: Down to 3 beers a day from 4-6 a day   . Drug use: No  . Sexual activity: Yes    Partners: Female    Comment: Wife  Lifestyle  . Physical activity    Days per week: Not on file    Minutes per session: Not on file  . Stress: Not on file  Relationships  . Social Herbalist on phone: Not on file    Gets together: Not on file    Attends religious service: Not on file    Active member of club or organization: Not on file    Attends meetings of clubs or organizations: Not on file    Relationship status: Not on file  Other Topics Concern  . Not on file  Social History Narrative   Christian Cole grew up in [place]. He is currently living in [place] with his wife. This is his second marriage. He has 1 daughter and 2 sons from his first marriage. He has 4 step kids. His daughter lives in [place], one son in [place], [place] and the other son in [place], [place]. 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 He enjoys wood working on his spare time. 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       2 brothers and 2 sisters he is next to youngest      Family History:   The patient's family history includes Arthritis in his maternal grandmother and mother; Cancer (age of onset: 89) in his father; Heart disease in his father; Hypertension in his father; Stroke in his father.   ROS:   Please see the history of present illness.    ROS All other  systems reviewed and are negative.   PHYSICAL EXAM:   VS:  BP 130/66 (BP Location: Left Arm, Patient Position: Sitting, Cuff Size: Normal)   Pulse (!) 49   Temp (!) 97 F (36.1 C)   Ht 5\' 11"  (1.803 m)   Wt 201 lb 8 oz (91.4 kg)   SpO2 97%   BMI 28.10 kg/m    GEN: Well nourished, well developed, in no acute distress  HEENT: normal  Neck: no JVD, carotid bruits, or masses Cardiac: RRR; no murmurs, rubs, or gallops,no edema  Respiratory:  clear to auscultation bilaterally, normal work of breathing GI: soft, nontender, nondistended, + BS MS: no deformity or atrophy  Skin: warm and dry, no rash Neuro:  Alert and Oriented x 3, Strength and sensation are intact Psych: euthymic mood, full affect  Wt Readings from Last 3 Encounters:  06/09/19 201 lb 8 oz (91.4 kg)  01/09/19 190 lb (86.2 kg)  12/10/18 208 lb (94.3 kg)      Studies/Labs Reviewed:   EKG:  EKG is ordered today.  The ekg ordered today demonstrates sinus bradycardia with T wave inversion and Q waves in the inferior lead.  Recent Labs: 12/03/2018: TSH 1.43 06/08/2019: ALT 23; BUN 18; Creatinine, Ser 1.04; Hemoglobin 14.7; Platelets 174; Potassium 4.6; Sodium 138   Lipid Panel    Component Value Date/Time   CHOL 145 12/03/2018 0908   TRIG 113.0 12/03/2018 0908   HDL 59.10 12/03/2018 0908   CHOLHDL 2 12/03/2018 0908   VLDL 22.6 12/03/2018 0908   LDLCALC 63 12/03/2018 0908   LDLDIRECT 91.6 01/06/2014 1443    Additional studies/ records that were reviewed today include:   Myoview 06/03/2019  Normal pharmacologic myocardial perfusion stress test without significant ischemia or scar.  The left ventricular ejection fraction is normal  (55-65%).  This is a low risk study.  Coronary artery and aortic atherosclerotic calcifications are noted on the attenuation correction CT, as well as post-CABG findings.    ASSESSMENT:    1. Preop cardiovascular exam   2. Coronary artery disease involving coronary bypass graft of native heart without angina pectoris   3. Essential hypertension   4. Hyperlipidemia LDL goal <70   5. OSA on CPAP   6. Bradycardia      PLAN:  In order of problems listed above:  1. Preoperative clearance: He continues to be quite active.  Recent Myoview obtained on 06/03/2019 was low risk without any ischemia or infarction.  EKG continue to show T wave inversion in the inferior leads.  At this time, given the reassuring stress test, patient may proceed with surgery from cardiology perspective.  He can hold Plavix for 5 to 7 days prior to the procedure and restart as soon as possible after the surgery at the surgeon's discretion.  2. CAD s/p CABG: Bypass surgery performed in 2000.  Recent Myoview was negative.  On Plavix monotherapy which can be held prior to the surgery.  3. Hypertension: Blood pressure stable on current therapy.  4. Hyperlipidemia: Last lipid panel obtained in January 2020 showed well-controlled cholesterol.  5. Obstructive sleep apnea: On CPAP therapy.  6. Baseline bradycardia: Asymptomatic.  Monitor heart rate during surgery closely given relative bradycardia on no AV nodal blocking agent.    Medication Adjustments/Labs and Tests Ordered: Current medicines are reviewed at length with the patient today.  Concerns regarding medicines are outlined above.  Medication changes, Labs and Tests ordered today are listed in the Patient Instructions below. Patient Instructions  Medication Instructions:  Your physician recommends that you continue on your current medications as directed. Please refer to the Current Medication list given to you today.  If you need a refill on your cardiac  medications before your next appointment, please call your pharmacy.   Lab work: NONE ordered at this time of appointment   If you have labs (blood work) drawn today and your tests are completely normal, you will receive your results only by: Marland Kitchen MyChart Message (if you have MyChart) OR . A paper copy in the mail If you have any lab test that is abnormal or we need to change your treatment, we will call you to review the results.  Testing/Procedures: NONE ordered at this time of appointment   Follow-Up: At [facility], you and your health needs are our priority.  As part of our continuing mission to provide you with exceptional heart care, we have created designated Provider Care Teams.  These Care Teams include your primary Cardiologist (physician) and Advanced Practice Providers (APPs -  Physician Assistants and Nurse Practitioners) who all work together to provide you with the care you need, when you need it. You will need a follow up appointment in 6 months.  Please call our office 2 months in advance to schedule this appointment.  You may see Shelva Majestic, MD or one of the following Advanced Practice Providers on your designated Care Team: [place], [place] . Fabian Sharp, PA-C  Any Other Special Instructions Will Be Listed Below (If Applicable). You are cleared for your surgery     Weston Brass Almyra Deforest, [place]  06/09/2019 9:32 AM    [address], [place], [place]  [address] Phone: [phone]; Fax: [phone]

## 2019-06-11 ENCOUNTER — Ambulatory Visit (INDEPENDENT_AMBULATORY_CARE_PROVIDER_SITE_OTHER): Payer: Medicare Other | Admitting: Internal Medicine

## 2019-06-11 ENCOUNTER — Other Ambulatory Visit: Payer: Self-pay

## 2019-06-11 DIAGNOSIS — Z720 Tobacco use: Secondary | ICD-10-CM | POA: Diagnosis not present

## 2019-06-11 DIAGNOSIS — I6529 Occlusion and stenosis of unspecified carotid artery: Secondary | ICD-10-CM | POA: Insufficient documentation

## 2019-06-11 DIAGNOSIS — I2510 Atherosclerotic heart disease of native coronary artery without angina pectoris: Secondary | ICD-10-CM | POA: Diagnosis not present

## 2019-06-11 DIAGNOSIS — F17219 Nicotine dependence, cigarettes, with unspecified nicotine-induced disorders: Secondary | ICD-10-CM | POA: Diagnosis not present

## 2019-06-11 DIAGNOSIS — E785 Hyperlipidemia, unspecified: Secondary | ICD-10-CM | POA: Diagnosis not present

## 2019-06-11 DIAGNOSIS — I10 Essential (primary) hypertension: Secondary | ICD-10-CM

## 2019-06-11 DIAGNOSIS — I6523 Occlusion and stenosis of bilateral carotid arteries: Secondary | ICD-10-CM

## 2019-06-11 DIAGNOSIS — Z716 Tobacco abuse counseling: Secondary | ICD-10-CM | POA: Diagnosis not present

## 2019-06-11 MED ORDER — BUPROPION HCL ER (SR) 150 MG PO TB12: ORAL_TABLET | ORAL | 3 refills | Status: DC

## 2019-06-11 NOTE — Progress Notes (Signed)
Telephone Note  I connected with Dutch Gray   on 06/11/19 at 11:00 AM EDT by telephone and verified that I am speaking with the correct person using two identifiers.  Location patient: home Location provider:work  Persons participating in the virtual visit: patient, provider  I discussed the limitations of evaluation and management by telemedicine and the availability of in person appointments. The patient expressed understanding and agreed to proceed.   HPI: HTN controlled on norvasc 5 mg qd and lotensin 40 mg qd   Pending penile prosthesis 06/26/2019 with urology   CAS/s/p CABG doing well recently stress test with atherosclerosis but pt currently w/o sx's and cholesterol controlled BP controlled on plavix 75 mg qd and cardiology f/u went well   Smoking he is smoking 1/2 ppd has tried chantix in the past but expensive and wants to quit and agreeable to try medication    ROS: See pertinent positives and negatives per HPI.  Past Medical History:  Diagnosis Date  . CAD (coronary artery disease)    a. s/p CABG in 2000 with LIMA-LAD, RIMA-PDA, SVG-D1, and SVG-PLA b. low-risk NST in 2016  . Cancer (HCC)    skin cancer follows Dr. Kellie Moor   . ED (erectile dysfunction)   . GERD (gastroesophageal reflux disease)   . Hyperlipidemia   . Hypertension    Followed by Dr. Claiborne Billings  . OSA on CPAP   . Sinus bradycardia   . Thrombocytopenia ([place])     Past Surgical History:  Procedure Laterality Date  . CATARACT EXTRACTION, BILATERAL  2010  . CORONARY ARTERY BYPASS GRAFT  2000   LIMA to the LAD,RIMA to the PDA,vein graft to the diagonal & vein graft to the PLA  . KNEE DEBRIDEMENT  1989   Fluid flush  . SHOULDER SURGERY  2010   bone spur   . US ECHOCARDIOGRAPHY  12/12/2010   mild LA dilatation, normal LV systolic fx, EF > 15%    Family History  Problem Relation Age of Onset  . Arthritis Mother   . Heart disease Father        CAD - MI  . Stroke Father   . Hypertension Father    . Cancer Father 60       esophageal cancer   . Arthritis Maternal Grandmother     SOCIAL HX:  Married    Current Outpatient Medications:  .  alprostadil (EDEX) 40 MCG injection, Inject 25 mcg as directed.  Use no more than 2 times per week, Disp: 2 each, Rfl: 18 .  atorvastatin (LIPITOR) 40 MG tablet, TAKE 1 TABLET DAILY AT 6 P.M. (KEEP OFFICE VISIT), Disp: 90 tablet, Rfl: 2 .  benazepril (LOTENSIN) 40 MG tablet, Take 1 tablet (40 mg total) by mouth daily. OFFICE VISIT NEEDED, Disp: 90 tablet, Rfl: 0 .  clopidogrel (PLAVIX) 75 MG tablet, TAKE 1 TABLET DAILY, Disp: 90 tablet, Rfl: 3 .  ezetimibe (ZETIA) 10 MG tablet, Take 1 tablet (10 mg total) by mouth daily., Disp: 90 tablet, Rfl: 2 .  pantoprazole (PROTONIX) 40 MG tablet, take 1 tablet by mouth once daily 30 minutes BEFORE A MEAL, Disp: 90 tablet, Rfl: 3 .  amLODipine (NORVASC) 5 MG tablet, Take 1 tablet (5 mg total) by mouth daily., Disp: 90 tablet, Rfl: 3 .  buPROPion (WELLBUTRIN SR) 150 MG 12 hr tablet, Daily x 3 days and then bid, Disp: 180 tablet, Rfl: 3  EXAM:  VITALS per patient if applicable:  GENERAL: alert, oriented, appears well and  in no acute distress  PSYCH/NEURO: pleasant and cooperative, no obvious depression or anxiety, speech and thought processing grossly intact  ASSESSMENT AND PLAN:  Discussed the following assessment and plan:  Essential hypertension/hld s/p cabg with b/l cas mild- Plan: cont meds   Encounter for smoking cessation counseling - Plan: buPROPion (WELLBUTRIN SR) 150 MG 12 hr tablet qd x 3 days then bid  Call if side effects   Tobacco abuse/dependence - Plan: buPROPion (WELLBUTRIN SR) 150 MG 12 hr tablet rec smoking cessation   HM Declinedflu shot Had prevnar, zostervax,utdpna 23 vaccine Disc shingrix for futureand Tdapgiven Rx previously  Disc hepA/B vaccinetoday given Rx previously   HadPSAnl 12/03/18 Hep C neg  CT chesthad 01/09/2019 rec smoking cessation as abovedue  for repeat  -rec smoking cessation wants to try wellbutrin in future will let me know 1/2 ppd as pf 06/11/19  US abdomen1/29/19 neg normal spleen size, +fatty liver, 1.3 cm right kidney cyst Colonoscopy had 02/16/14 tubular adenoma [facility] polyps [facility]  Sees derm Dr. Langston Reusing 2019SCC left arm x 2 f/u for 2020 rescheduled ? Date   Feeling great f/u OSA Dr. Patrick North [phone] fax [phone] benefits CPAP f/u in 1 year    Local meds [facility] protonix, wellbutrin chronic med[facility]   I discussed the assessment and treatment plan with the patient. The patient was provided an opportunity to ask questions and all were answered. The patient agreed with the plan and demonstrated an understanding of the instructions.   The patient was advised to call back or seek an in-person evaluation if the symptoms worsen or if the condition fails to improve as anticipated.  Time spent 20 minutes 3 of those counseling smoking cessation  Delorise Jackson, MD

## 2019-06-22 DIAGNOSIS — I440 Atrioventricular block, first degree: Secondary | ICD-10-CM | POA: Diagnosis not present

## 2019-06-22 DIAGNOSIS — Z01818 Encounter for other preprocedural examination: Secondary | ICD-10-CM | POA: Diagnosis not present

## 2019-06-22 DIAGNOSIS — N529 Male erectile dysfunction, unspecified: Secondary | ICD-10-CM | POA: Diagnosis not present

## 2019-06-22 DIAGNOSIS — R9431 Abnormal electrocardiogram [ECG] [EKG]: Secondary | ICD-10-CM | POA: Diagnosis not present

## 2019-06-22 DIAGNOSIS — E785 Hyperlipidemia, unspecified: Secondary | ICD-10-CM | POA: Diagnosis not present

## 2019-06-22 DIAGNOSIS — Z720 Tobacco use: Secondary | ICD-10-CM | POA: Diagnosis not present

## 2019-06-22 DIAGNOSIS — R001 Bradycardia, unspecified: Secondary | ICD-10-CM | POA: Diagnosis not present

## 2019-06-22 DIAGNOSIS — I10 Essential (primary) hypertension: Secondary | ICD-10-CM | POA: Diagnosis not present

## 2019-06-22 DIAGNOSIS — G4733 Obstructive sleep apnea (adult) (pediatric): Secondary | ICD-10-CM | POA: Diagnosis not present

## 2019-06-22 DIAGNOSIS — I25810 Atherosclerosis of coronary artery bypass graft(s) without angina pectoris: Secondary | ICD-10-CM | POA: Diagnosis not present

## 2019-06-24 DIAGNOSIS — Z1159 Encounter for screening for other viral diseases: Secondary | ICD-10-CM | POA: Diagnosis not present

## 2019-06-26 DIAGNOSIS — N529 Male erectile dysfunction, unspecified: Secondary | ICD-10-CM | POA: Diagnosis not present

## 2019-06-26 DIAGNOSIS — I10 Essential (primary) hypertension: Secondary | ICD-10-CM | POA: Diagnosis not present

## 2019-06-26 DIAGNOSIS — I2510 Atherosclerotic heart disease of native coronary artery without angina pectoris: Secondary | ICD-10-CM | POA: Diagnosis not present

## 2019-06-26 DIAGNOSIS — N5201 Erectile dysfunction due to arterial insufficiency: Secondary | ICD-10-CM | POA: Diagnosis not present

## 2019-06-26 DIAGNOSIS — Z7902 Long term (current) use of antithrombotics/antiplatelets: Secondary | ICD-10-CM | POA: Diagnosis not present

## 2019-06-26 DIAGNOSIS — Z951 Presence of aortocoronary bypass graft: Secondary | ICD-10-CM | POA: Diagnosis not present

## 2019-06-26 DIAGNOSIS — E785 Hyperlipidemia, unspecified: Secondary | ICD-10-CM | POA: Diagnosis not present

## 2019-06-26 DIAGNOSIS — J449 Chronic obstructive pulmonary disease, unspecified: Secondary | ICD-10-CM | POA: Diagnosis not present

## 2019-06-26 DIAGNOSIS — Z9989 Dependence on other enabling machines and devices: Secondary | ICD-10-CM | POA: Diagnosis not present

## 2019-06-26 DIAGNOSIS — F17210 Nicotine dependence, cigarettes, uncomplicated: Secondary | ICD-10-CM | POA: Diagnosis not present

## 2019-06-26 DIAGNOSIS — Z79899 Other long term (current) drug therapy: Secondary | ICD-10-CM | POA: Diagnosis not present

## 2019-06-26 DIAGNOSIS — G4733 Obstructive sleep apnea (adult) (pediatric): Secondary | ICD-10-CM | POA: Diagnosis not present

## 2019-06-29 DIAGNOSIS — N529 Male erectile dysfunction, unspecified: Secondary | ICD-10-CM | POA: Diagnosis not present

## 2019-06-29 DIAGNOSIS — N9989 Other postprocedural complications and disorders of genitourinary system: Secondary | ICD-10-CM | POA: Diagnosis not present

## 2019-06-29 DIAGNOSIS — R338 Other retention of urine: Secondary | ICD-10-CM | POA: Diagnosis not present

## 2019-07-27 DIAGNOSIS — Z09 Encounter for follow-up examination after completed treatment for conditions other than malignant neoplasm: Secondary | ICD-10-CM | POA: Diagnosis not present

## 2019-07-27 DIAGNOSIS — N529 Male erectile dysfunction, unspecified: Secondary | ICD-10-CM | POA: Diagnosis not present

## 2019-08-27 DIAGNOSIS — L570 Actinic keratosis: Secondary | ICD-10-CM | POA: Diagnosis not present

## 2019-08-27 DIAGNOSIS — Z85828 Personal history of other malignant neoplasm of skin: Secondary | ICD-10-CM | POA: Diagnosis not present

## 2019-08-27 DIAGNOSIS — Z08 Encounter for follow-up examination after completed treatment for malignant neoplasm: Secondary | ICD-10-CM | POA: Diagnosis not present

## 2019-08-27 DIAGNOSIS — C44329 Squamous cell carcinoma of skin of other parts of face: Secondary | ICD-10-CM | POA: Diagnosis not present

## 2019-08-27 DIAGNOSIS — D485 Neoplasm of uncertain behavior of skin: Secondary | ICD-10-CM | POA: Diagnosis not present

## 2019-08-27 DIAGNOSIS — X32XXXA Exposure to sunlight, initial encounter: Secondary | ICD-10-CM | POA: Diagnosis not present

## 2019-09-10 DIAGNOSIS — C44329 Squamous cell carcinoma of skin of other parts of face: Secondary | ICD-10-CM | POA: Diagnosis not present

## 2019-10-02 ENCOUNTER — Other Ambulatory Visit: Payer: Self-pay

## 2019-10-12 ENCOUNTER — Other Ambulatory Visit: Payer: Self-pay | Admitting: Student

## 2019-10-17 ENCOUNTER — Other Ambulatory Visit: Payer: Self-pay | Admitting: Cardiovascular Disease

## 2019-12-16 ENCOUNTER — Other Ambulatory Visit: Payer: Self-pay

## 2019-12-16 ENCOUNTER — Ambulatory Visit (INDEPENDENT_AMBULATORY_CARE_PROVIDER_SITE_OTHER): Payer: Medicare Other | Admitting: Internal Medicine

## 2019-12-16 ENCOUNTER — Telehealth: Payer: Self-pay | Admitting: *Deleted

## 2019-12-16 ENCOUNTER — Encounter: Payer: Self-pay | Admitting: Internal Medicine

## 2019-12-16 VITALS — BP 120/65 | Ht 71.0 in | Wt 205.0 lb

## 2019-12-16 DIAGNOSIS — Z1389 Encounter for screening for other disorder: Secondary | ICD-10-CM | POA: Diagnosis not present

## 2019-12-16 DIAGNOSIS — E559 Vitamin D deficiency, unspecified: Secondary | ICD-10-CM

## 2019-12-16 DIAGNOSIS — K219 Gastro-esophageal reflux disease without esophagitis: Secondary | ICD-10-CM | POA: Diagnosis not present

## 2019-12-16 DIAGNOSIS — N400 Enlarged prostate without lower urinary tract symptoms: Secondary | ICD-10-CM

## 2019-12-16 DIAGNOSIS — D126 Benign neoplasm of colon, unspecified: Secondary | ICD-10-CM

## 2019-12-16 DIAGNOSIS — R7303 Prediabetes: Secondary | ICD-10-CM | POA: Diagnosis not present

## 2019-12-16 DIAGNOSIS — I10 Essential (primary) hypertension: Secondary | ICD-10-CM

## 2019-12-16 DIAGNOSIS — Z1211 Encounter for screening for malignant neoplasm of colon: Secondary | ICD-10-CM

## 2019-12-16 DIAGNOSIS — Z1329 Encounter for screening for other suspected endocrine disorder: Secondary | ICD-10-CM | POA: Diagnosis not present

## 2019-12-16 DIAGNOSIS — Z125 Encounter for screening for malignant neoplasm of prostate: Secondary | ICD-10-CM | POA: Diagnosis not present

## 2019-12-16 MED ORDER — PANTOPRAZOLE SODIUM 40 MG PO TBEC: DELAYED_RELEASE_TABLET | ORAL | 3 refills | Status: DC

## 2019-12-16 MED ORDER — AMLODIPINE BESYLATE 5 MG PO TABS: 5.0000 mg | ORAL_TABLET | Freq: Every day | ORAL | 3 refills | Status: DC

## 2019-12-16 NOTE — Progress Notes (Signed)
telephone Note  I connected with Dutch Gray  on 12/16/19 at 11:00 AM EST by telephone and verified that I am speaking with the correct person using two identifiers.  Location patient: home Location provider:work or home office Persons participating in the virtual visit: patient, provider  I discussed the limitations of evaluation and management by telemedicine and the availability of in person appointments. The patient expressed understanding and agreed to proceed.   HPI: 1. HTN/CAD doing well no CP, no sob BP controlled on meds norvasc 5 mg qd, benazepril 40 mg qd f/u cardiology Dr. Claiborne Billings upcoming will CC labs to him once done at [place] 2. COPD still smoking but less down to 6-7 cig qd contacted Shawn perkins to sch CT chest annual due 01/10/20 they will reach out to pt  3. H/o polyps due to see [facility] will make referral EGD/Colonoscopy    ROS: See pertinent positives and negatives per HPI.  Past Medical History:  Diagnosis Date  . CAD (coronary artery disease)    a. s/p CABG in 2000 with LIMA-LAD, RIMA-PDA, SVG-D1, and SVG-PLA b. low-risk NST in 2016  . Cancer (HCC)    skin cancer follows Dr. Kellie Moor   . ED (erectile dysfunction)   . GERD (gastroesophageal reflux disease)   . Hyperlipidemia   . Hypertension    Followed by Dr. Claiborne Billings  . OSA on CPAP   . Sinus bradycardia   . Thrombocytopenia ([place])     Past Surgical History:  Procedure Laterality Date  . CATARACT EXTRACTION, BILATERAL  2010  . CORONARY ARTERY BYPASS GRAFT  2000   LIMA to the LAD,RIMA to the PDA,vein graft to the diagonal & vein graft to the PLA  . KNEE DEBRIDEMENT  1989   Fluid flush  . SHOULDER SURGERY  2010   bone spur   . US ECHOCARDIOGRAPHY  12/12/2010   mild LA dilatation, normal LV systolic fx, EF > [id number]    Family History  Problem Relation Age of Onset  . Arthritis Mother   . Heart disease Father        CAD - MI  . Stroke Father   . Hypertension Father   . Cancer Father 55   esophageal cancer   . Arthritis Maternal Grandmother     SOCIAL HX:  Daxter grew up in [place]. He is currently living in [place] with his wife. This is his second marriage. He has 1 daughter and 2 sons from his first marriage. He has 4 step kids. His daughter lives in [place], one son in [place], [place] and the other son in [place], [place]. 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  He enjoys wood working on his spare time. 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   He retired 04/2019 and enjoys this   2 brothers and 2 sisters he is next to youngest    Current Outpatient Medications:  .  alprostadil (EDEX) 40 MCG injection, Inject 25 mcg as directed.  Use no more than 2 times per week, Disp: 2 each, Rfl: 18 .  atorvastatin (LIPITOR) 40 MG tablet, TAKE 1 TABLET DAILY AT 6 P.M. (KEEP OFFICE VISIT), Disp: 90 tablet, Rfl: 3 .  benazepril (LOTENSIN) 40 MG tablet, Take 1 tablet (40 mg total) by mouth daily. OFFICE VISIT NEEDED, Disp: 90 tablet, Rfl: 0 .  buPROPion (  WELLBUTRIN SR) 150 MG 12 hr tablet, Daily x 3 days and then bid, Disp: 180 tablet, Rfl: 3 .  clopidogrel (PLAVIX) 75 MG tablet, TAKE 1 TABLET DAILY, Disp: 90 tablet, Rfl: 2 .  ezetimibe (ZETIA) 10 MG tablet, Take 1 tablet (10 mg total) by mouth daily., Disp: 90 tablet, Rfl: 2 .  pantoprazole (PROTONIX) 40 MG tablet, take 1 tablet by mouth once daily 30 minutes BEFORE A MEAL, Disp: 90 tablet, Rfl: 3 .  amLODipine (NORVASC) 5 MG tablet, Take 1 tablet (5 mg total) by mouth daily., Disp: 90 tablet, Rfl: 3  EXAM:  VITALS per patient if applicable:  GENERAL: alert, oriented, appears well and in no acute distress  HEENT: atraumatic, conjunttiva clear, no obvious abnormalities on inspection of external nose and ears  NECK: normal movements of the head and neck  LUNGS: on  inspection no signs of respiratory distress, breathing rate appears normal, no obvious gross SOB, gasping or wheezing  CV: no obvious cyanosis  MS: moves all visible extremities without noticeable abnormality  PSYCH/NEURO: pleasant and cooperative, no obvious depression or anxiety, speech and thought processing grossly intact  ASSESSMENT AND PLAN:  Discussed the following assessment and plan:  Essential hypertension - Plan: amLODipine (NORVASC) 5 MG tablet, cont benzepril 40 mg qd  Comprehensive metabolic panel, Lipid panel, CBC with Differential/Platelet [facility]  Gastroesophageal reflux disease - Plan: pantoprazole (PROTONIX) 40 MG tablet  Prediabetes - Plan: Hemoglobin A1c  HM Declinedflu shot Had prevnar, zostervax,utdpna 23 vaccine Disc shingrix for futureand Tdapgiven Rx previously  Disc hepA/B vaccineprev given Rx previously with fatty liver  Pt consider covid 19 vaccine   HadPSAnl 12/03/18 Hep C neg Has mvt with vitamin D3   CT chesthad 01/09/2019 rec smoking cessation as abovedue for repeat  -rec smoking cessation wants to try wellbutrin in future will let me know1/2 ppd as pf 06/11/19 and as of 12/16/19 has cut back to 6-7 cig  Repeat CT chest due 01/10/20 they will reach out to pt   US abdomen1/29/19 neg normal spleen size, +fatty liver, 1.3 cm right kidney cyst  Colonoscopy had 02/16/14 tubular adenoma [facility] polyps [facility]   Sees derm Dr. Langston Reusing 2019SCC left arm x 2 f/u for 2020-2021 bx bcc forearm left sch spring 2021    Feeling great f/u OSA Dr. Patrick North [id number] fax [phone] benefits CPAP f/u in 1 year  Local meds [facility] protonix, wellbutrin  chronic meds [facility]   -we discussed possible serious and likely etiologies, options for evaluation and workup, limitations of telemedicine visit vs in person visit, treatment, treatment risks and precautions. Pt prefers to treat via  telemedicine empirically rather then risking or undertaking an in person visit at this moment. Patient agrees to seek prompt in person care if worsening, new symptoms arise, or if is not improving with treatment.   I discussed the assessment and treatment plan with the patient. The patient was provided an opportunity to ask questions and all were answered. The patient agreed with the plan and demonstrated an understanding of the instructions.   The patient was advised to call back or seek an in-person evaluation if the symptoms worsen or if the condition fails to improve as anticipated.  Time spent 20 minutes  Delorise Jackson, MD

## 2019-12-17 ENCOUNTER — Ambulatory Visit (INDEPENDENT_AMBULATORY_CARE_PROVIDER_SITE_OTHER): Payer: Medicare Other | Admitting: Cardiovascular Disease

## 2019-12-17 ENCOUNTER — Encounter: Payer: Self-pay | Admitting: Cardiovascular Disease

## 2019-12-17 DIAGNOSIS — R7303 Prediabetes: Secondary | ICD-10-CM | POA: Diagnosis not present

## 2019-12-17 DIAGNOSIS — Z1389 Encounter for screening for other disorder: Secondary | ICD-10-CM | POA: Diagnosis not present

## 2019-12-17 DIAGNOSIS — Z9989 Dependence on other enabling machines and devices: Secondary | ICD-10-CM

## 2019-12-17 DIAGNOSIS — I771 Stricture of artery: Secondary | ICD-10-CM | POA: Diagnosis not present

## 2019-12-17 DIAGNOSIS — Z720 Tobacco use: Secondary | ICD-10-CM

## 2019-12-17 DIAGNOSIS — I25810 Atherosclerosis of coronary artery bypass graft(s) without angina pectoris: Secondary | ICD-10-CM | POA: Diagnosis not present

## 2019-12-17 DIAGNOSIS — E559 Vitamin D deficiency, unspecified: Secondary | ICD-10-CM | POA: Diagnosis not present

## 2019-12-17 DIAGNOSIS — I10 Essential (primary) hypertension: Secondary | ICD-10-CM | POA: Diagnosis not present

## 2019-12-17 DIAGNOSIS — Z951 Presence of aortocoronary bypass graft: Secondary | ICD-10-CM | POA: Diagnosis not present

## 2019-12-17 DIAGNOSIS — N400 Enlarged prostate without lower urinary tract symptoms: Secondary | ICD-10-CM | POA: Diagnosis not present

## 2019-12-17 DIAGNOSIS — R0989 Other specified symptoms and signs involving the circulatory and respiratory systems: Secondary | ICD-10-CM | POA: Diagnosis not present

## 2019-12-17 DIAGNOSIS — N529 Male erectile dysfunction, unspecified: Secondary | ICD-10-CM

## 2019-12-17 DIAGNOSIS — Z1329 Encounter for screening for other suspected endocrine disorder: Secondary | ICD-10-CM | POA: Diagnosis not present

## 2019-12-17 DIAGNOSIS — G4733 Obstructive sleep apnea (adult) (pediatric): Secondary | ICD-10-CM | POA: Diagnosis not present

## 2019-12-17 DIAGNOSIS — Z125 Encounter for screening for malignant neoplasm of prostate: Secondary | ICD-10-CM | POA: Diagnosis not present

## 2019-12-17 NOTE — Patient Instructions (Signed)
Medication Instructions:  CONTINUE WITH CURRENT MEDICATIONS *If you need a refill on your cardiac medications before your next appointment, please call your pharmacy*  Lab Work: CMET, TSH, CBC, HGB A1C, LIPID, PSA, URINALYSIS, VIT D TODAY FASTING If you have labs (blood work) drawn today and your tests are completely normal, you will receive your results only by: Marland Kitchen MyChart Message (if you have MyChart) OR . A paper copy in the mail If you have any lab test that is abnormal or we need to change your treatment, we will call you to review the results.  Follow-Up: At [facility], you and your health needs are our priority.  As part of our continuing mission to provide you with exceptional heart care, we have created designated Provider Care Teams.  These Care Teams include your primary Cardiologist (physician) and Advanced Practice Providers (APPs -  Physician Assistants and Nurse Practitioners) who all work together to provide you with the care you need, when you need it.  Your next appointment:   12 month(s)  The format for your next appointment:   In Person  Provider:   Shelva Majestic, MD

## 2019-12-17 NOTE — Progress Notes (Signed)
Patient ID: KYRILLOS ADAMS, male   DOB: February 28, 1951, 69 y.o.   MRN: [id number]     HPI: ADDEN STROUT is a 69 y.o. male the presents for a 63 month cardiology evaluation.  Mr Shidler has known CAD and underwent initial percutaneous coronary intervention in 1998 of an unknown vessel. He underwent CABG revascularization surgery in December 2000 with LIMA to LAD, LIMA to the PDA, vein to the diagonal, and then to the PLA. 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  An echo Doppler study in January 2002 showed normal systolic function. His mild TR and mild MR. A nuclear perfusion study in March 2013 showed normal perfusion without scar or ischemia.  Since I last saw him one year ago he has continued to do well.  He remains active.  He denies any symptoms of chest pain.  He he had seen Cecilie Kicks in March 2016 and she had reduced his metoprolol to 12.5 mg twice a day due to asymptomatic bradycardia.  He had blood work in December 2015 by his primary care physician and his total cholesterol was 166, HDL 32, LDL 104, VLDL 29 and triglycerides 149.  He has been maintained on atorvastatin 40 mg, Plavix 75 mg, pantoprazole 40 mg, as well as Viagra as needed.  He underwent a nuclear perfusion study on April 26, 2015.  He remained asymptomatic.  He developed asymptomatic ST segment depression during stress.  Scintigraphic images were entirely normal.  He had normal function.  Ejection fraction was 52%.  He presents for evaluation.  When I  saw him in October 2017 he was doing well and was without chest pain.  He was exercising daily.  He had lost over 40 pounds in the 8 months prior to that evaluation.  His peak weight was 242 and his weight this morning without clothes at home was 195.  He continues to use CPAP with 100% compliance.   Since I last saw him, he was  evaluated by Bernerd Pho in October 2018.  His blood pressure was elevated and amlodipine 5 mg was added to his benazepril and Lopressor.  He had resumed smoking and at that time was given a prescription for Chantix.  I last saw him in December 2019 and prior to that evaluation he had  received a new CPAP machine which was prescribed by physician in [place].  He was sleeping well and cannot sleep without his CPAP.  Unfortunately he was still smoking 1/2 pack of cigarettes per day.  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  He walks at least 2 miles.  He denies any chest pain PND orthopnea.   Since I last saw him, he underwent carotid Dopplers in June 2020 which did not reveal any significant internal carotid stenoses in the 1 to 39% range.  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  A Myoview study in July 2020 showed EF 55 to 60% without scar or ischemia.  He was seen by Almyra Deforest for preoperative clearance and underwent successful inflatable penile prosthesis by Dr. Meta Hatchet at [facility] urology.  He tolerated surgery without cardiovascular compromise.  Presently, he feels well.  He denies anginal symptoms.  Unfortunately he still smoking half pack of 6/day.  He continues to be on CPAP therapy at 11 cm water pressure and admits to excellent compliance.  He presents for reevaluation.  Past Medical History:  Diagnosis Date  . CAD (coronary artery disease)    a. s/p CABG in 2000 with LIMA-LAD, RIMA-PDA, SVG-D1, and SVG-PLA b. low-risk NST in 2016  . Cancer (HCC)    skin cancer follows Dr. Kellie Moor   . ED (erectile dysfunction)   . GERD (gastroesophageal reflux disease)   . Hyperlipidemia   . Hypertension    Followed by Dr. Claiborne Billings  . OSA on CPAP   . Sinus bradycardia   . Thrombocytopenia ([place])     Past Surgical History:  Procedure Laterality Date  . CATARACT EXTRACTION, BILATERAL  2010  . CORONARY ARTERY BYPASS GRAFT   2000   LIMA to the LAD,RIMA to the PDA,vein graft to the diagonal & vein graft to the PLA  . KNEE DEBRIDEMENT  1989   Fluid flush  . SHOULDER SURGERY  2010   bone spur   . US ECHOCARDIOGRAPHY  12/12/2010   mild LA dilatation, normal LV systolic fx, EF > 26%    No Known Allergies  Current Outpatient Medications  Medication Sig Dispense Refill  . amLODipine (NORVASC) 5 MG tablet Take 1 tablet (5 mg total) by mouth daily. 90 tablet 3  . atorvastatin (LIPITOR) 40 MG tablet TAKE 1 TABLET DAILY AT 6 P.M. (KEEP OFFICE VISIT) 90 tablet 3  . benazepril (LOTENSIN) 40 MG tablet Take 1 tablet (40 mg total) by mouth daily. OFFICE VISIT NEEDED 90 tablet 0  . buPROPion (WELLBUTRIN SR) 150 MG 12 hr tablet Daily x 3 days and then bid 180 tablet 3  . clopidogrel (PLAVIX) 75 MG tablet TAKE 1 TABLET DAILY 90 tablet 2  . ezetimibe (ZETIA) 10 MG tablet Take 1 tablet (10 mg total) by mouth daily. 90 tablet 2  . pantoprazole (PROTONIX) 40 MG tablet take 1 tablet by mouth once daily 30 minutes BEFORE A MEAL 90 tablet 3   No current facility-administered medications for this visit.    Social History   Socioeconomic History  . Marital status: Married    Spouse name: Not on file  . Number of children: 3  . Years of education: 31  . Highest education level: Not on file  Occupational History  . Occupation: Retired Corporate treasurer  . Occupation: Management/Technical Support    Comment: Psychiatric nurse  Tobacco Use  . Smoking status: Current Every Day Smoker    Packs/day: 0.50    Years: 44.00    Pack years: 22.00    Types: Cigarettes  . Smokeless tobacco: Never Used  . Tobacco comment: on and off smoker   Substance and Sexual Activity  . Alcohol use: Yes    Alcohol/week: 10.0 standard drinks    Types: 10 Cans of beer per week    Comment: Down to 3 beers a day from 4-6 a day   . Drug use: No  . Sexual activity: Yes    Partners: Female    Comment: Wife  Other Topics Concern  . Not on file  Social History  Narrative   Antwoin grew up in [place]. He is currently living in [place] with his wife. This is his second marriage. He has 1 daughter and 2 sons from his first marriage. He has 4 step kids. His daughter lives in [place], one son in [place], [place] and the other son in [place], [place]. 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  He enjoys wood working on his spare time. 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       He retired 04/2019 and enjoys this       2 brothers and 2 sisters he is next to youngest    Social Determinants of Radio broadcast assistant Strain:   . Difficulty of Paying Living Expenses: Not on file  Food Insecurity:   . Worried About Charity fundraiser in the Last Year: Not on file  . Ran Out of Food in the Last Year: Not on file  Transportation Needs:   . Lack of Transportation (Medical): Not on file  . Lack of Transportation (Non-Medical): Not on file  Physical Activity:   . Days of Exercise per Week: Not on file  . Minutes of Exercise per Session: Not on file  Stress:   . Feeling of Stress : Not on file  Social Connections:   . Frequency of Communication with Friends and Family: Not on file  . Frequency of Social Gatherings with Friends and Family: Not on file  . Attends Religious Services: Not on file  . Active Member of Clubs or Organizations: Not on file  . Attends Archivist Meetings: Not on file  . Marital Status: Not on file  Intimate Partner Violence:   . Fear of Current or Ex-Partner: Not on file  . Emotionally Abused: Not on file  . Physically Abused: Not on file  . Sexually Abused: Not on file    Family History  Problem Relation Age of Onset  . Arthritis Mother   . Heart disease Father        CAD - MI  . Stroke Father   . Hypertension Father   . Cancer Father 71        esophageal cancer   . Arthritis Maternal Grandmother     ROS General: Negative; No fevers, chills, or night sweats;  HEENT: Negative; No changes in vision or hearing, sinus congestion, difficulty swallowing Pulmonary: Negative; No cough, wheezing, shortness of breath, hemoptysis Cardiovascular: See history of present illness GI: Negative; No nausea, vomiting, diarrhea, or abdominal pain GU: Negative; No dysuria, hematuria, or difficulty voiding Musculoskeletal: Negative; no myalgias, joint pain, or weakness Hematologic/Oncology: Negative; no easy bruising, bleeding Endocrine: Negative; no heat/cold intolerance; no diabetes Neuro: Negative; no changes in balance, headaches Skin: Negative; No rashes or skin lesions Psychiatric: Negative; No behavioral problems, depression Sleep: Positive for OSA on CPAP therapy.  No snoring, daytime sleepiness, hypersomnolence, bruxism, restless legs, hypnogognic hallucinations, no cataplexy Other comprehensive 14 point system review is negative.  PE BP 124/72   Pulse (!) 58   Ht '5\' 11"'$  (1.803 m)   Wt 215 lb 12.8 oz (97.9 kg)   BMI 30.10 kg/m    Repeat blood pressure by me was 130/74  Wt Readings from Last 3 Encounters:  12/17/19 215 lb 12.8 oz (97.9 kg)  12/16/19 205 lb (93 kg)  06/09/19 201 lb 8 oz (91.4 kg)   General: Alert, oriented, no distress.  Skin: normal turgor, no rashes, warm and dry HEENT: Normocephalic, atraumatic. Pupils equal round and reactive to light; sclera anicteric; extraocular muscles intact;  Nose without nasal septal hypertrophy Mouth/Parynx benign; Mallinpatti scale 3 Neck: No JVD, no carotid bruits; normal carotid upstroke Lungs: clear to ausculatation and percussion; no wheezing or rales Chest wall: without tenderness to palpitation Heart: PMI not displaced, RRR,  s1 s2 normal, 1/6 systolic murmur, no diastolic murmur, no rubs, gallops, thrills, or heaves Abdomen: soft, nontender; no hepatosplenomehaly, BS+;  abdominal aorta nontender and not dilated by palpation. Back: no CVA tenderness Pulses 2+ Musculoskeletal: full range of motion, normal strength, no joint deformities Extremities: no clubbing cyanosis or edema, Homan's sign negative  Neurologic: grossly nonfocal; Cranial nerves grossly wnl Psychologic: Normal mood and affect   ECG (independently read by me): Sinus bradycardia at 58 bpm, first-degree AV block with a PR interval at 244 ms.  No significant ST-T changes.  QTc interval 431 ms  December 2019 ECG (independently read by me): Sinus bradycardia at 56 bpm.  First-degree block with PR interval 264 ms.  QTc interval 443 ms  October 2017 ECG (independently read by me): Sinus bradycardia at 44 bpm with first degree AV block.  Q wave in leads III and F.  PR interval 224 ms.  June 2016 ECG (independently read by me): not done today due to recent treadmill nuclear study  ECG (independently read by me): Sinus bradycardia at 45 beats per minute. Normal intervals. Q waves in lead 3 and small Q wave in aVF.  LABS: BMP Latest Ref Rng & Units 12/17/2019 06/08/2019 12/03/2018  Glucose 65 - 99 mg/dL 97 105(H) 91  BUN 8 - 27 mg/dL '14 18 12  '$ Creatinine 0.76 - 1.27 mg/dL 1.13 1.04 0.96  BUN/Creat Ratio 10 - 24 12 - -  Sodium 134 - 144 mmol/L 139 138 138  Potassium 3.5 - 5.2 mmol/L 5.0 4.6 4.5  Chloride 96 - 106 mmol/L 102 102 101  CO2 20 - 29 mmol/L 19(L) 27 27  Calcium 8.6 - 10.2 mg/dL 9.6 9.6 10.0   Hepatic Function Latest Ref Rng & Units 12/17/2019 06/08/2019 12/03/2018  Total Protein 6.0 - 8.5 g/dL 7.5 7.1 7.5  Albumin 3.8 - 4.8 g/dL 5.0(H) 4.7 4.7  AST 0 - 40 IU/L '24 23 18  '$ ALT 0 - 44 IU/L '24 23 16  '$ Alk Phosphatase 39 - 117 IU/L 104 101 81  Total Bilirubin 0.0 - 1.2 mg/dL 0.6 0.6 0.8   CBC Latest Ref Rng & Units 12/17/2019 06/08/2019 12/03/2018  WBC 3.4 - 10.8 x10E3/uL 9.2 8.5 8.3  Hemoglobin 13.0 - 17.7 g/dL 15.4 14.7 15.6  Hematocrit 37.5 - 51.0 % 45.4 43.8 45.2  Platelets 150 - 450  x10E3/uL 146(L) 174 138.0(L)   Lab Results  Component Value Date   MCV 95 12/17/2019   MCV 95.6 06/08/2019   MCV 94.2 12/03/2018   Lab Results  Component Value Date   TSH 0.966 12/17/2019   Lab Results  Component Value Date   HGBA1C 5.8 (H) 12/17/2019    Lipid Panel     Component Value Date/Time   CHOL 167 12/17/2019 1113   TRIG 87 12/17/2019 1113   HDL 86 12/17/2019 1113   CHOLHDL 1.9 12/17/2019 1113   CHOLHDL 2 12/03/2018 0908   VLDL 22.6 12/03/2018 0908   LDLCALC 65 12/17/2019 1113   LDLDIRECT 91.6 01/06/2014 1443    RADIOLOGY: No results found.  IMPRESSION: 1. Coronary artery disease involving coronary bypass graft of native heart without angina pectoris   2. Hx of CABG   3. Essential hypertension   4. Left Carotid bruit   5. 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 OSA on CPAP   7. Tobacco use   8. Erectile dysfunction, unspecified erectile dysfunction type: Status post penile implant     ASSESSMENT AND PLAN: Mr. Jarreau  Nave is a 69 year-old gentleman with history of CAD who underwent initial intervention in 1998 and due to progressive CAD underwent CABG surgery in 2000. A nuclear perfusion study in 2013 remained normal without scar or ischemia.  A three-year follow-up nuclear perfusion study on 04/26/2015 continued to show entirely normal perfusion without scar or ischemia.  However, he developed asymptomatic ST depression during stress.  For this reason, it was interpreted as intermediate study.  Post-rest ejection fraction was 52%.  A repeat Myoview in July 2020 EF 55 to 60% without ischemia or scar.  He underwent successful penile implant surgery without cardiovascular compromise.  Presently, his blood pressure is stable on his regimen consent of amlodipine 5 mg in addition to benazepril 40 mg.  He continues to be on Zetia 10 mg in addition to atorvastatin 40 mg for hyperlipidemia with target LDL less than 70.  Due to a left carotid bruit, he underwent carotid  duplex imaging which did not reveal any significant internal carotid stenoses but suggested stenosis of the left subclavian artery with PSV at 263 cm/s.  His GERD is controlled with pantoprazole.  Unfortunately continues to smoke cigarettes and smoking cessation was strongly advised.  He continues to use CPAP with 100% compliance and "cannot sleep without it."  I am recommending a complete set of laboratory checked including comprehensive metabolic panel, CBC, TSH, lipid panel, and will also check PSA, vitamin D, and hemoglobin A1c.  I will see him in 1 year for evaluation or sooner as needed.  Troy Sine, MD, [facility]  12/19/2019 10:27 AM

## 2019-12-18 LAB — COMPREHENSIVE METABOLIC PANEL
ALT: 24 IU/L (ref 0–44)
AST: 24 IU/L (ref 0–40)
Albumin/Globulin Ratio: 2 (ref 1.2–2.2)
Albumin: 5 g/dL — ABNORMAL HIGH (ref 3.8–4.8)
Alkaline Phosphatase: 104 IU/L (ref 39–117)
BUN/Creatinine Ratio: 12 (ref 10–24)
BUN: 14 mg/dL (ref 8–27)
Bilirubin Total: 0.6 mg/dL (ref 0.0–1.2)
CO2: 19 mmol/L — ABNORMAL LOW (ref 20–29)
Calcium: 9.6 mg/dL (ref 8.6–10.2)
Chloride: 102 mmol/L (ref 96–106)
Creatinine, Ser: 1.13 mg/dL (ref 0.76–1.27)
GFR calc Af Amer: 76 mL/min/{1.73_m2} (ref >59)
GFR calc non Af Amer: 66 mL/min/{1.73_m2} (ref >59)
Globulin, Total: 2.5 g/dL (ref 1.5–4.5)
Glucose: 97 mg/dL (ref 65–99)
Potassium: 5 mmol/L (ref 3.5–5.2)
Sodium: 139 mmol/L (ref 134–144)
Total Protein: 7.5 g/dL (ref 6.0–8.5)

## 2019-12-18 LAB — URINALYSIS, ROUTINE W REFLEX MICROSCOPIC
Bilirubin, UA: NEGATIVE
Glucose, UA: NEGATIVE
Ketones, UA: NEGATIVE
Leukocytes,UA: NEGATIVE
Nitrite, UA: NEGATIVE
Protein,UA: NEGATIVE
RBC, UA: NEGATIVE
Specific Gravity, UA: 1.01 (ref 1.005–1.030)
Urobilinogen, Ur: 0.2 mg/dL (ref 0.2–1.0)
pH, UA: 6.5 (ref 5.0–7.5)

## 2019-12-18 LAB — CBC WITH DIFFERENTIAL/PLATELET
Basophils Absolute: 0 10*3/uL (ref 0.0–0.2)
Basos: 0 %
EOS (ABSOLUTE): 0.1 10*3/uL (ref 0.0–0.4)
Eos: 2 %
Hematocrit: 45.4 % (ref 37.5–51.0)
Hemoglobin: 15.4 g/dL (ref 13.0–17.7)
Immature Grans (Abs): 0.1 10*3/uL (ref 0.0–0.1)
Immature Granulocytes: 1 %
Lymphocytes Absolute: 3.8 10*3/uL — ABNORMAL HIGH (ref 0.7–3.1)
Lymphs: 41 %
MCH: 32.1 pg (ref 26.6–33.0)
MCHC: 33.9 g/dL (ref 31.5–35.7)
MCV: 95 fL (ref 79–97)
Monocytes Absolute: 0.9 10*3/uL (ref 0.1–0.9)
Monocytes: 9 %
Neutrophils Absolute: 4.3 10*3/uL (ref 1.4–7.0)
Neutrophils: 47 %
Platelets: 146 10*3/uL — ABNORMAL LOW (ref 150–450)
RBC: 4.8 x10E6/uL (ref 4.14–5.80)
RDW: 12.6 % (ref 11.6–15.4)
WBC: 9.2 10*3/uL (ref 3.4–10.8)

## 2019-12-18 LAB — LIPID PANEL
Chol/HDL Ratio: 1.9 ratio (ref 0.0–5.0)
Cholesterol, Total: 167 mg/dL (ref 100–199)
HDL: 86 mg/dL (ref >39)
LDL Chol Calc (NIH): 65 mg/dL (ref 0–99)
Triglycerides: 87 mg/dL (ref 0–149)
VLDL Cholesterol Cal: 16 mg/dL (ref 5–40)

## 2019-12-18 LAB — PSA: Prostate Specific Ag, Serum: 1 ng/mL (ref 0.0–4.0)

## 2019-12-18 LAB — TSH: TSH: 0.966 u[IU]/mL (ref 0.450–4.500)

## 2019-12-18 LAB — HEMOGLOBIN A1C
Est. average glucose Bld gHb Est-mCnc: 120 mg/dL
Hgb A1c MFr Bld: 5.8 % — ABNORMAL HIGH (ref 4.8–5.6)

## 2019-12-18 LAB — VITAMIN D 25 HYDROXY (VIT D DEFICIENCY, FRACTURES): Vit D, 25-Hydroxy: 29.4 ng/mL — ABNORMAL LOW (ref 30.0–100.0)

## 2019-12-18 NOTE — Telephone Encounter (Signed)
Left message for patient to notify them that it is time to schedule annual low dose lung cancer screening CT scan. Instructed patient to call back to verify information prior to the scan being scheduled.  

## 2019-12-19 ENCOUNTER — Encounter: Payer: Self-pay | Admitting: Cardiovascular Disease

## 2019-12-21 ENCOUNTER — Other Ambulatory Visit: Payer: Self-pay

## 2019-12-22 ENCOUNTER — Other Ambulatory Visit: Payer: Self-pay

## 2019-12-22 MED ORDER — EZETIMIBE 10 MG PO TABS: 10.0000 mg | ORAL_TABLET | Freq: Every day | ORAL | 2 refills | Status: DC

## 2019-12-28 ENCOUNTER — Other Ambulatory Visit: Payer: Self-pay

## 2019-12-28 MED ORDER — BENAZEPRIL HCL 40 MG PO TABS: 40.0000 mg | ORAL_TABLET | Freq: Every day | ORAL | 0 refills | Status: DC

## 2019-12-31 ENCOUNTER — Telehealth: Payer: Self-pay | Admitting: *Deleted

## 2019-12-31 DIAGNOSIS — Z87891 Personal history of nicotine dependence: Secondary | ICD-10-CM

## 2019-12-31 NOTE — Telephone Encounter (Signed)
(  12/31/2019) Patient has been notified that lung cancer screening CT scan is due currently or will be in near future. Confirmed that patient is within the appropriate age range, and asymptomatic. Patient denies illness that would prevent curative treatment for lung cancer if found. Verified smoking history (Current Smoker,0.5 ppd ). Patient is agreeable for CT scan being scheduled, no day or time preference  SRW

## 2020-01-01 NOTE — Addendum Note (Signed)
Addended by: Lieutenant Diego on: 01/01/2020 12:02 PM   Modules accepted: Orders

## 2020-01-01 NOTE — Telephone Encounter (Signed)
Smoking history: current, 45.5 pack year 

## 2020-01-08 ENCOUNTER — Ambulatory Visit: Payer: Medicare Other | Attending: Internal Medicine

## 2020-01-08 DIAGNOSIS — Z23 Encounter for immunization: Secondary | ICD-10-CM | POA: Insufficient documentation

## 2020-01-08 NOTE — Progress Notes (Signed)
   Covid-19 Vaccination Clinic  Name:  Christian [place]    MRN: [id number] DOB: 1951/11/08  01/08/2020  Mr. Weghorst was observed post Covid-19 immunization for 15 minutes without incidence. He was provided with Vaccine Information Sheet and instruction to access the V-Safe system.   Mr. Fronheiser was instructed to call 911 with any severe reactions post vaccine: Marland Kitchen Difficulty breathing  . Swelling of your face and throat  . A fast heartbeat  . A bad rash all over your body  . Dizziness and weakness    Immunizations Administered    Name Date Dose VIS Date Route   Pfizer COVID-19 Vaccine 01/08/2020  8:44 AM 0.3 mL 10/23/2019 Intramuscular   Manufacturer: [place]   Lot: [id number]   [place]: [id number]

## 2020-01-12 ENCOUNTER — Other Ambulatory Visit: Payer: Self-pay

## 2020-01-12 ENCOUNTER — Ambulatory Visit
Admission: RE | Admit: 2020-01-12 | Discharge: 2020-01-12 | Disposition: A | Discharge status: Home / Self Care | Payer: Medicare Other | Source: Ambulatory Visit | Attending: Nurse Practitioner | Admitting: Nurse Practitioner

## 2020-01-12 DIAGNOSIS — Z87891 Personal history of nicotine dependence: Secondary | ICD-10-CM | POA: Diagnosis not present

## 2020-01-12 DIAGNOSIS — F17210 Nicotine dependence, cigarettes, uncomplicated: Secondary | ICD-10-CM | POA: Diagnosis not present

## 2020-01-13 ENCOUNTER — Telehealth: Payer: Self-pay | Admitting: Internal Medicine

## 2020-01-13 NOTE — Telephone Encounter (Signed)
Patient informed and verbalized understanding, states he saw the results on mychart and has no further questions.

## 2020-01-13 NOTE — Telephone Encounter (Signed)
IMPRESSION: 1. Lung-RADS 2, benign appearance or behavior. Continue annual screening with low-dose chest CT without contrast in 12 months. 2. 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 3. Aortic Atherosclerosis (ICD10-I70.0) and Emphysema (ICD10-J43.9).   CT chest  4.3 stable enlarged aorta/blood vessel in chest f/u every 1 year  Plaque build up in aorta and  COPD noted  No lung cancer

## 2020-02-02 ENCOUNTER — Ambulatory Visit: Payer: Medicare Other | Attending: Internal Medicine

## 2020-02-02 DIAGNOSIS — Z23 Encounter for immunization: Secondary | ICD-10-CM

## 2020-02-02 NOTE — Progress Notes (Signed)
   Covid-19 Vaccination Clinic  Name:  Christian Cole    MRN: [id number] DOB: 1951-02-16  02/02/2020  [name] was observed post Covid-19 immunization for 15 minutes without incident. He was provided with Vaccine Information Sheet and instruction to access the V-Safe system.   [name] was instructed to call 911 with any severe reactions post vaccine: Marland Kitchen Difficulty breathing  . Swelling of face and throat  . A fast heartbeat  . A bad rash all over body  . Dizziness and weakness   Immunizations Administered    Name Date Dose VIS Date Route   Pfizer COVID-19 Vaccine 02/02/2020  9:31 AM 0.3 mL 10/23/2019 Intramuscular   Manufacturer: [place]   Lot: [id number]   Russell: [id number]

## 2020-02-05 ENCOUNTER — Telehealth: Payer: Self-pay

## 2020-02-05 DIAGNOSIS — Z800 Family history of malignant neoplasm of digestive organs: Secondary | ICD-10-CM | POA: Diagnosis not present

## 2020-02-05 DIAGNOSIS — K219 Gastro-esophageal reflux disease without esophagitis: Secondary | ICD-10-CM | POA: Diagnosis not present

## 2020-02-05 DIAGNOSIS — Z86010 Personal history of colonic polyps: Secondary | ICD-10-CM | POA: Diagnosis not present

## 2020-02-05 NOTE — Telephone Encounter (Signed)
   [facility] Pre-operative Risk Assessment    Request for surgical clearance:  1. What type of surgery is being performed?colonoscopy   2. When is this surgery scheduled? 04/15/20   3. What type of clearance is required (medical clearance vs. Pharmacy clearance to hold med vs. Both)? both  4. Are there any medications that need to be held prior to surgery and how long?Plavix - 5 days   5. Practice name and name of physician performing surgery? [facility] dept / Effie Berkshire, PA   6. What is your office phone number [phone]     7.   What is your office fax number [phone]  8.   Anesthesia type (None, local, MAC, general) ? unknown   Sherrie Mustache 02/05/2020, 5:01 PM  _________________________________________________________________   (provider comments below)

## 2020-02-08 NOTE — Telephone Encounter (Signed)
   Primary Cardiologist: Shelva Majestic, MD  Chart reviewed as part of pre-operative protocol coverage. Patient was contacted 02/08/2020 in reference to pre-operative risk assessment for pending surgery as outlined below.  Christian Cole was last seen on 12/17/19 by Dr. Claiborne Billings.  Since that day, Christian Cole has done well with no chest pain or SOB. With his hx of CAD and CABG.  He also has sleep apnea.  His last interventions was some years ago so he may be off Plavix for 5 days prior to procedure and resume post procedure.   Therefore, based on ACC/AHA guidelines, the patient would be at acceptable risk for the planned procedure without further cardiovascular testing.   I will route this recommendation to the requesting party via Epic fax function and remove from pre-op pool.  Please call with questions.  Cecilie Kicks, NP 02/08/2020, 1:53 PM

## 2020-03-04 ENCOUNTER — Ambulatory Visit: Payer: Self-pay | Admitting: Urology

## 2020-03-09 ENCOUNTER — Ambulatory Visit (INDEPENDENT_AMBULATORY_CARE_PROVIDER_SITE_OTHER): Payer: Medicare Other | Admitting: Urology

## 2020-03-09 ENCOUNTER — Encounter: Payer: Self-pay | Admitting: Urology

## 2020-03-09 ENCOUNTER — Other Ambulatory Visit: Payer: Self-pay

## 2020-03-09 VITALS — BP 123/77 | HR 62 | Ht 70.0 in | Wt 213.0 lb

## 2020-03-09 DIAGNOSIS — R5383 Other fatigue: Secondary | ICD-10-CM | POA: Diagnosis not present

## 2020-03-09 DIAGNOSIS — N5201 Erectile dysfunction due to arterial insufficiency: Secondary | ICD-10-CM

## 2020-03-09 NOTE — Progress Notes (Signed)
03/09/2020 11:17 AM   Christian Cole 1951-08-18 [id number]  Referring provider: [name], Nino Glow, MD [place],  [place] [address]  Chief Complaint  Patient presents with  . Erectile Dysfunction    HPI: 69 y.o. male presents for follow-up of erectile dysfunction.  Last visit was virtual April 2020.  He had failed PDE 5 inhibitors and intracavernosal injections and was interested in penile implant surgery.  He saw Dr. Francesca Jewett at [facility] and underwent placement of a inflatable penile prosthesis in August 2020.  He had no postoperative problems and is quite satisfied with the implant.  The main reason for his visit today is to discuss possible TRT.  He has noted some increased tiredness, fatigue and decreased muscle mass.  He has not had testosterone levels checked.  No bothersome lower urinary tract symptoms.  PSA performed by his PCP in February was stable at 1.0.   PMH: Past Medical History:  Diagnosis Date  . CAD (coronary artery disease)    a. s/p CABG in 2000 with LIMA-LAD, RIMA-PDA, SVG-D1, and SVG-PLA b. low-risk NST in 2016  . Cancer (HCC)    skin cancer follows Dr. Kellie Moor   . ED (erectile dysfunction)   . GERD (gastroesophageal reflux disease)   . Hyperlipidemia   . Hypertension    Followed by Dr. Claiborne Billings  . OSA on CPAP   . Sinus bradycardia   . Thrombocytopenia [facility])     Surgical History: Past Surgical History:  Procedure Laterality Date  . CATARACT EXTRACTION, BILATERAL  2010  . CORONARY ARTERY BYPASS GRAFT  2000   LIMA to the LAD,RIMA to the PDA,vein graft to the diagonal & vein graft to the PLA  . KNEE DEBRIDEMENT  1989   Fluid flush  . SHOULDER SURGERY  2010   bone spur   . US ECHOCARDIOGRAPHY  12/12/2010   mild LA dilatation, normal LV systolic fx, EF > [id number]    Home Medications:  Allergies as of 03/09/2020   No Known Allergies     Medication List       Accurate as of March 09, 2020 11:17 AM. If you have any questions, ask  your nurse or doctor.        amLODipine 5 MG tablet Commonly known as: NORVASC Take 1 tablet (5 mg total) by mouth daily.   atorvastatin 40 MG tablet Commonly known as: LIPITOR TAKE 1 TABLET DAILY AT 6 P.M. (KEEP OFFICE VISIT)   benazepril 40 MG tablet Commonly known as: LOTENSIN Take 1 tablet (40 mg total) by mouth daily. OFFICE VISIT NEEDED   buPROPion 150 MG 12 hr tablet Commonly known as: Wellbutrin SR Daily x 3 days and then bid   clopidogrel 75 MG tablet Commonly known as: PLAVIX TAKE 1 TABLET DAILY   ezetimibe 10 MG tablet Commonly known as: ZETIA Take 1 tablet (10 mg total) by mouth daily.   pantoprazole 40 MG tablet Commonly known as: PROTONIX take 1 tablet by mouth once daily 30 minutes BEFORE A MEAL       Allergies: No Known Allergies  Family History: Family History  Problem Relation Age of Onset  . Arthritis Mother   . Heart disease Father        CAD - MI  . Stroke Father   . Hypertension Father   . Cancer Father 9       esophageal cancer   . Arthritis Maternal Grandmother     Social History:  reports that he has been  smoking cigarettes. He has a 22.00 pack-year smoking history. He has never used smokeless tobacco. He reports current alcohol use of about 10.0 standard drinks of alcohol per week. He reports that he does not use drugs.   Physical Exam: BP 123/77   Pulse 62   Ht 5\' 10"  (1.778 m)   Wt 213 lb (96.6 kg)   BMI 30.56 kg/m   Constitutional:  Alert and oriented, No acute distress. HEENT: [place] AT, moist mucus membranes.  Trachea midline, no masses. Cardiovascular: No clubbing, cyanosis, or edema. Respiratory: Normal respiratory effort, no increased work of breathing.   Assessment & Plan:    - Erectile dysfunction Doing well status post placement penile implant at [facility].  - Tiredness/fatigue Interested in TRT.  We discussed that insurance companies require 2 abnormal levels drawn before 10 AM and will set up a lab visit for a  testosterone and LH.   Abbie Sons, [place] [address] [place],  [address] [phone]

## 2020-03-10 DIAGNOSIS — Z85828 Personal history of other malignant neoplasm of skin: Secondary | ICD-10-CM | POA: Diagnosis not present

## 2020-03-10 DIAGNOSIS — D485 Neoplasm of uncertain behavior of skin: Secondary | ICD-10-CM | POA: Diagnosis not present

## 2020-03-10 DIAGNOSIS — D225 Melanocytic nevi of trunk: Secondary | ICD-10-CM | POA: Diagnosis not present

## 2020-03-10 DIAGNOSIS — D2261 Melanocytic nevi of right upper limb, including shoulder: Secondary | ICD-10-CM | POA: Diagnosis not present

## 2020-03-10 DIAGNOSIS — L570 Actinic keratosis: Secondary | ICD-10-CM | POA: Diagnosis not present

## 2020-03-10 DIAGNOSIS — L538 Other specified erythematous conditions: Secondary | ICD-10-CM | POA: Diagnosis not present

## 2020-03-10 DIAGNOSIS — D2262 Melanocytic nevi of left upper limb, including shoulder: Secondary | ICD-10-CM | POA: Diagnosis not present

## 2020-03-10 DIAGNOSIS — L820 Inflamed seborrheic keratosis: Secondary | ICD-10-CM | POA: Diagnosis not present

## 2020-03-10 DIAGNOSIS — X32XXXA Exposure to sunlight, initial encounter: Secondary | ICD-10-CM | POA: Diagnosis not present

## 2020-03-16 ENCOUNTER — Other Ambulatory Visit: Payer: Self-pay

## 2020-03-16 DIAGNOSIS — N5201 Erectile dysfunction due to arterial insufficiency: Secondary | ICD-10-CM

## 2020-03-17 ENCOUNTER — Other Ambulatory Visit: Payer: Medicare Other

## 2020-03-17 ENCOUNTER — Other Ambulatory Visit: Payer: Self-pay

## 2020-03-17 DIAGNOSIS — N5201 Erectile dysfunction due to arterial insufficiency: Secondary | ICD-10-CM

## 2020-03-18 ENCOUNTER — Telehealth: Payer: Self-pay

## 2020-03-18 LAB — LUTEINIZING HORMONE: LH: 10.9 m[IU]/mL — ABNORMAL HIGH (ref 1.7–8.6)

## 2020-03-18 LAB — TESTOSTERONE: Testosterone: 242 ng/dL — ABNORMAL LOW (ref 264–916)

## 2020-03-18 NOTE — Telephone Encounter (Signed)
Contacted Christian Cole as advised. Set him up for another testosterone check on 03/24/2020 @ 8 AM. Patient verbalized understanding.

## 2020-03-22 ENCOUNTER — Other Ambulatory Visit: Payer: Self-pay

## 2020-03-22 DIAGNOSIS — N5201 Erectile dysfunction due to arterial insufficiency: Secondary | ICD-10-CM

## 2020-03-24 ENCOUNTER — Other Ambulatory Visit: Payer: Medicare Other

## 2020-03-24 ENCOUNTER — Other Ambulatory Visit: Payer: Self-pay

## 2020-03-24 DIAGNOSIS — N5201 Erectile dysfunction due to arterial insufficiency: Secondary | ICD-10-CM | POA: Diagnosis not present

## 2020-03-25 LAB — TESTOSTERONE: Testosterone: 276 ng/dL (ref 264–916)

## 2020-03-27 ENCOUNTER — Other Ambulatory Visit: Payer: Self-pay | Admitting: Cardiovascular Disease

## 2020-03-28 ENCOUNTER — Telehealth: Payer: Self-pay | Admitting: *Deleted

## 2020-03-28 NOTE — Telephone Encounter (Signed)
Notified patient as instructed, patient pleased. Discussed follow-up appointments, patient agrees  

## 2020-03-28 NOTE — Telephone Encounter (Signed)
-----   Message from Abbie Sons, MD sent at 03/27/2020 10:51 AM EDT ----- Second a.m. testosterone level was low normal at 276.  Insurance will not cover replacement unless he has 2 abnormal levels.  Recommend a lab visit for free/total testosterone in 2-4 weeks.

## 2020-04-13 ENCOUNTER — Other Ambulatory Visit
Admission: RE | Admit: 2020-04-13 | Discharge: 2020-04-13 | Disposition: A | Discharge status: Home / Self Care | Payer: Medicare Other | Source: Ambulatory Visit | Attending: General Surgery | Admitting: General Surgery

## 2020-04-13 ENCOUNTER — Other Ambulatory Visit: Payer: Self-pay

## 2020-04-13 DIAGNOSIS — Z01812 Encounter for preprocedural laboratory examination: Secondary | ICD-10-CM | POA: Diagnosis not present

## 2020-04-13 DIAGNOSIS — Z20822 Contact with and (suspected) exposure to covid-19: Secondary | ICD-10-CM | POA: Insufficient documentation

## 2020-04-13 LAB — SARS CORONAVIRUS 2 (TAT 6-24 HRS): SARS Coronavirus 2: NEGATIVE

## 2020-04-14 ENCOUNTER — Encounter: Payer: Self-pay | Admitting: General Surgery

## 2020-04-15 ENCOUNTER — Encounter
Admission: RE | Disposition: A | Discharge status: Home / Self Care | Payer: Self-pay | Source: Home / Self Care | Attending: General Surgery

## 2020-04-15 ENCOUNTER — Ambulatory Visit: Payer: Medicare Other | Admitting: Certified Registered"

## 2020-04-15 ENCOUNTER — Encounter: Payer: Self-pay | Admitting: General Surgery

## 2020-04-15 ENCOUNTER — Ambulatory Visit
Admission: RE | Admit: 2020-04-15 | Discharge: 2020-04-15 | Disposition: A | Discharge status: Home / Self Care | Payer: Medicare Other | Attending: General Surgery | Admitting: General Surgery

## 2020-04-15 DIAGNOSIS — K2100 Gastro-esophageal reflux disease with esophagitis, without bleeding: Secondary | ICD-10-CM | POA: Diagnosis not present

## 2020-04-15 DIAGNOSIS — K2950 Unspecified chronic gastritis without bleeding: Secondary | ICD-10-CM | POA: Diagnosis not present

## 2020-04-15 DIAGNOSIS — Z79899 Other long term (current) drug therapy: Secondary | ICD-10-CM | POA: Insufficient documentation

## 2020-04-15 DIAGNOSIS — K2980 Duodenitis without bleeding: Secondary | ICD-10-CM | POA: Insufficient documentation

## 2020-04-15 DIAGNOSIS — F17210 Nicotine dependence, cigarettes, uncomplicated: Secondary | ICD-10-CM | POA: Diagnosis not present

## 2020-04-15 DIAGNOSIS — Z7902 Long term (current) use of antithrombotics/antiplatelets: Secondary | ICD-10-CM | POA: Insufficient documentation

## 2020-04-15 DIAGNOSIS — G4733 Obstructive sleep apnea (adult) (pediatric): Secondary | ICD-10-CM | POA: Diagnosis not present

## 2020-04-15 DIAGNOSIS — Z86010 Personal history of colonic polyps: Secondary | ICD-10-CM | POA: Diagnosis not present

## 2020-04-15 DIAGNOSIS — R12 Heartburn: Secondary | ICD-10-CM | POA: Diagnosis not present

## 2020-04-15 DIAGNOSIS — I2510 Atherosclerotic heart disease of native coronary artery without angina pectoris: Secondary | ICD-10-CM | POA: Diagnosis not present

## 2020-04-15 DIAGNOSIS — E785 Hyperlipidemia, unspecified: Secondary | ICD-10-CM | POA: Diagnosis not present

## 2020-04-15 DIAGNOSIS — Z951 Presence of aortocoronary bypass graft: Secondary | ICD-10-CM | POA: Diagnosis not present

## 2020-04-15 DIAGNOSIS — K649 Unspecified hemorrhoids: Secondary | ICD-10-CM | POA: Insufficient documentation

## 2020-04-15 DIAGNOSIS — I10 Essential (primary) hypertension: Secondary | ICD-10-CM | POA: Diagnosis not present

## 2020-04-15 DIAGNOSIS — Z1211 Encounter for screening for malignant neoplasm of colon: Secondary | ICD-10-CM | POA: Diagnosis not present

## 2020-04-15 DIAGNOSIS — K319 Disease of stomach and duodenum, unspecified: Secondary | ICD-10-CM | POA: Insufficient documentation

## 2020-04-15 HISTORY — PX: COLONOSCOPY WITH PROPOFOL: SHX5780

## 2020-04-15 HISTORY — PX: ESOPHAGOGASTRODUODENOSCOPY: SHX5428

## 2020-04-15 SURGERY — COLONOSCOPY WITH PROPOFOL
Anesthesia: General

## 2020-04-15 MED ORDER — CLOPIDOGREL BISULFATE 75 MG PO TABS: 75.0000 mg | ORAL_TABLET | Freq: Every day | ORAL | 2 refills | Status: DC

## 2020-04-15 MED ORDER — SODIUM CHLORIDE 0.9 % IV SOLN: INTRAVENOUS | Status: DC

## 2020-04-15 MED ORDER — PROPOFOL 10 MG/ML IV BOLUS
INTRAVENOUS | Status: DC | PRN
  Administered 2020-04-15 (×3): 50 mg via INTRAVENOUS

## 2020-04-15 MED ORDER — PROPOFOL 500 MG/50ML IV EMUL
INTRAVENOUS | Status: DC | PRN
  Administered 2020-04-15: 140 ug/kg/min via INTRAVENOUS

## 2020-04-15 MED ORDER — LIDOCAINE HCL (CARDIAC) PF 100 MG/5ML IV SOSY
PREFILLED_SYRINGE | INTRAVENOUS | Status: DC | PRN
  Administered 2020-04-15: 50 mg via INTRAVENOUS

## 2020-04-15 MED ORDER — GLYCOPYRROLATE 0.2 MG/ML IJ SOLN
INTRAMUSCULAR | Status: DC | PRN
  Administered 2020-04-15: .2 mg via INTRAVENOUS

## 2020-04-15 MED ORDER — PROPOFOL 500 MG/50ML IV EMUL
INTRAVENOUS | Status: AC
  Filled 2020-04-15: qty 50

## 2020-04-15 MED ORDER — PROPOFOL 10 MG/ML IV BOLUS
INTRAVENOUS | Status: AC
  Filled 2020-04-15: qty 20

## 2020-04-15 NOTE — Transfer of Care (Signed)
Immediate Anesthesia Transfer of Care Note  Patient: Christian Cole  Procedure(s) Performed: COLONOSCOPY WITH PROPOFOL (N/A ) ESOPHAGOGASTRODUODENOSCOPY (EGD)  Patient Location: PACU and Endoscopy Unit  Anesthesia Type:General  Level of Consciousness: sedated  Airway & Oxygen Therapy: Patient Spontanous Breathing  Post-op Assessment: Report given to RN  Post vital signs: stable  Last Vitals:  Vitals Value Taken Time  BP    Temp    Pulse    Resp    SpO2      Last Pain:  Vitals:   04/15/20 0959  TempSrc: Temporal  PainSc: 0-No pain         Complications: No apparent anesthesia complications

## 2020-04-15 NOTE — Anesthesia Preprocedure Evaluation (Signed)
Anesthesia Evaluation  Patient identified by MRN, date of birth, ID band Patient awake    Reviewed: Allergy & Precautions, H&P , NPO status , Patient's Chart, lab work & pertinent test results  History of Anesthesia Complications Negative for: history of anesthetic complications  Airway Mallampati: III  TM Distance: <3 FB Neck ROM: limited    Dental  (+) Chipped, Poor Dentition, Missing, Upper Dentures   Pulmonary neg shortness of breath, sleep apnea and Continuous Positive Airway Pressure Ventilation , Current Smoker,    Pulmonary exam normal        Cardiovascular Exercise Tolerance: Good hypertension, (-) angina+ CAD and + CABG  Normal cardiovascular exam     Neuro/Psych negative neurological ROS  negative psych ROS   GI/Hepatic Neg liver ROS, GERD  Medicated and Controlled,  Endo/Other  negative endocrine ROS  Renal/GU negative Renal ROS  negative genitourinary   Musculoskeletal   Abdominal   Peds  Hematology negative hematology ROS (+)   Anesthesia Other Findings Past Medical History: No date: CAD (coronary artery disease)     Comment:  a. s/p CABG in 2000 with LIMA-LAD, RIMA-PDA, SVG-D1, and              SVG-PLA b. low-risk NST in 2016 No date: Cancer ([place])     Comment:  skin cancer follows Dr. Kellie Moor  No date: ED (erectile dysfunction) No date: GERD (gastroesophageal reflux disease) No date: Hyperlipidemia No date: Hypertension     Comment:  Followed by Dr. Claiborne Billings No date: OSA on CPAP No date: Sinus bradycardia No date: Thrombocytopenia ([place])  Past Surgical History: 2010: CATARACT EXTRACTION, BILATERAL 2000: CORONARY ARTERY BYPASS GRAFT     Comment:  LIMA to the LAD,RIMA to the PDA,vein graft to the               diagonal & vein graft to the PLA No date: ESOPHAGOGASTRODUODENOSCOPY     Comment:  Dr. Gustavo Lah 1991: KNEE ARTHROSCOPY 1989: KNEE DEBRIDEMENT     Comment:  Fluid flush 2011:  SHOULDER ARTHROSCOPY 2010: SHOULDER SURGERY     Comment:  bone spur  12/12/2010: US ECHOCARDIOGRAPHY     Comment:  mild LA dilatation, normal LV systolic fx, EF > 27%  BMI    Body Mass Index: 29.85 kg/m      Reproductive/Obstetrics negative OB ROS                             Anesthesia Physical Anesthesia Plan  ASA: III  Anesthesia Plan: General   Post-op Pain Management:    Induction: Intravenous  PONV Risk Score and Plan: Propofol infusion and TIVA  Airway Management Planned: Natural Airway and Nasal Cannula  Additional Equipment:   Intra-op Plan:   Post-operative Plan:   Informed Consent: I have reviewed the patients History and Physical, chart, labs and discussed the procedure including the risks, benefits and alternatives for the proposed anesthesia with the patient or authorized representative who has indicated his/her understanding and acceptance.     Dental Advisory Given  Plan Discussed with: Anesthesiologist, CRNA and Surgeon  Anesthesia Plan Comments: (Patient consented for risks of anesthesia including but not limited to:  - adverse reactions to medications - risk of intubation if required - damage to eyes, teeth, lips or other oral mucosa - nerve damage due to positioning  - sore throat or hoarseness - Damage to heart, brain, nerves, lungs, other parts of body or loss of life  Patient voiced understanding.)        Anesthesia Quick Evaluation

## 2020-04-15 NOTE — Anesthesia Postprocedure Evaluation (Signed)
Anesthesia Post Note  Patient: Christian Cole  Procedure(s) Performed: COLONOSCOPY WITH PROPOFOL (N/A ) ESOPHAGOGASTRODUODENOSCOPY (EGD)  Patient location during evaluation: Endoscopy Anesthesia Type: General Level of consciousness: oriented Pain management: pain level controlled Vital Signs Assessment: post-procedure vital signs reviewed and stable Respiratory status: spontaneous breathing, nonlabored ventilation, respiratory function stable and patient connected to nasal cannula oxygen Cardiovascular status: blood pressure returned to baseline and stable Postop Assessment: no apparent nausea or vomiting Anesthetic complications: no     Last Vitals:  Vitals:   04/15/20 1132 04/15/20 1142  BP: (!) 152/68 123/71  Pulse:    Resp:    Temp:    SpO2:      Last Pain:  Vitals:   04/15/20 1142  TempSrc:   PainSc: 0-No pain                 Precious Haws Athalene Kolle

## 2020-04-15 NOTE — Op Note (Signed)
[facility] Patient Name: Christian Cole Procedure Date: 04/15/2020 10:28 AM MRN: [id number] Account #: [id number] Date of Birth: Aug 19, 1951 Admit Type: Outpatient Age: 69 Room: [facility] ENDO ROOM 1 Gender: Male Note Status: Finalized Procedure:             Upper GI endoscopy Indications:           Heartburn Providers:             Robert Bellow, MD Medicines:             Monitored Anesthesia Care Complications:         No immediate complications. Procedure:             Pre-Anesthesia Assessment:                        - Prior to the procedure, a History and Physical was                         performed, and patient medications, allergies and                         sensitivities were reviewed. The patient's tolerance                         of previous anesthesia was reviewed.                        - The risks and benefits of the procedure and the                         sedation options and risks were discussed with the                         patient. All questions were answered and informed                         consent was obtained.                        After obtaining informed consent, the endoscope was                         passed under direct vision. Throughout the procedure,                         the patient's blood pressure, pulse, and oxygen                         saturations were monitored continuously. The Endoscope                         was introduced through the mouth, and advanced to the                         second part of duodenum. The upper GI endoscopy was                         accomplished without difficulty. The patient tolerated  the procedure well. Findings:      LA Grade B (one or more mucosal breaks greater than 5 mm, not extending       between the tops of two mucosal folds) esophagitis with no bleeding was       found 38 to 40 cm from the incisors. Biopsies were taken with a cold        forceps for histology.      Localized mild inflammation characterized by granularity was found in       the prepyloric region of the stomach. Biopsies were taken with a cold       forceps for histology.      Diffuse mild inflammation characterized by erythema was found in the       duodenal bulb. Impression:            - LA Grade B reflux esophagitis with no bleeding.                         Biopsied.                        - Chronic gastritis. Biopsied.                        - Chronic duodenitis. Recommendation:        - Telephone endoscopist for pathology results in 1                         week.                        - Perform a colonoscopy today. Procedure Code(s):     --- Professional ---                        [phone], Esophagogastroduodenoscopy, flexible,                         transoral; with biopsy, single or multiple Diagnosis Code(s):     --- Professional ---                        R12, Heartburn                        K29.80, Duodenitis without bleeding                        K29.50, Unspecified chronic gastritis without bleeding                        K21.00 CPT copyright 2019 American Medical Association. All rights reserved. The codes documented in this report are preliminary and upon coder review may  be revised to meet current compliance requirements. Robert Bellow, MD 04/15/2020 11:12:07 AM This report has been signed electronically. Number of Addenda: 0 Note Initiated On: 04/15/2020 10:28 AM      [facility]

## 2020-04-15 NOTE — H&P (Signed)
Christian Cole [id number] 01-02-1951     HPI:  69y/o male with long history of GERD with symptoms well controlled on PPI.  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  Tubular adenomas of the transverse colon noted.   Medications Prior to Admission  Medication Sig Dispense Refill Last Dose  . atorvastatin (LIPITOR) 40 MG tablet TAKE 1 TABLET DAILY AT 6 P.M. (KEEP OFFICE VISIT) 90 tablet 3 04/14/2020 at 2100  . benazepril (LOTENSIN) 40 MG tablet TAKE 1 TABLET DAILY (OFFICE VISIT NEEDED) 90 tablet 3 04/14/2020 at 2100  . buPROPion (WELLBUTRIN SR) 150 MG 12 hr tablet Daily x 3 days and then bid 180 tablet 3 04/14/2020 at 2100  . ezetimibe (ZETIA) 10 MG tablet Take 1 tablet (10 mg total) by mouth daily. 90 tablet 2 04/14/2020 at 2100  . pantoprazole (PROTONIX) 40 MG tablet take 1 tablet by mouth once daily 30 minutes BEFORE A MEAL 90 tablet 3 04/14/2020 at 1700  . amLODipine (NORVASC) 5 MG tablet Take 1 tablet (5 mg total) by mouth daily. 90 tablet 3   . clopidogrel (PLAVIX) 75 MG tablet TAKE 1 TABLET DAILY 90 tablet 2 04/08/2020   No Known Allergies Past Medical History:  Diagnosis Date  . CAD (coronary artery disease)    a. s/p CABG in 2000 with LIMA-LAD, RIMA-PDA, SVG-D1, and SVG-PLA b. low-risk NST in 2016  . Cancer (HCC)    skin cancer follows Dr. Kellie Moor   . ED (erectile dysfunction)   . GERD (gastroesophageal reflux disease)   . Hyperlipidemia   . Hypertension    Followed by Dr. Claiborne Billings  . OSA on CPAP   . Sinus bradycardia   . Thrombocytopenia ([place])    Past Surgical History:  Procedure Laterality Date  . CATARACT EXTRACTION, BILATERAL  2010  . CORONARY ARTERY BYPASS GRAFT  2000   LIMA to the LAD,RIMA to the PDA,vein graft to the diagonal & vein graft to the PLA  . ESOPHAGOGASTRODUODENOSCOPY     Dr. Gustavo Lah  . KNEE ARTHROSCOPY  1991  . KNEE DEBRIDEMENT  1989   Fluid flush  . SHOULDER ARTHROSCOPY  2011  . SHOULDER SURGERY  2010   bone spur   . US ECHOCARDIOGRAPHY   12/12/2010   mild LA dilatation, normal LV systolic fx, EF > 64%   Social History   Socioeconomic History  . Marital status: Married    Spouse name: Not on file  . Number of children: 3  . Years of education: 68  . Highest education level: Not on file  Occupational History  . Occupation: Retired Corporate treasurer  . Occupation: Management/Technical Support    Comment: Psychiatric nurse  Tobacco Use  . Smoking status: Current Every Day Smoker    Packs/day: 0.50    Years: 44.00    Pack years: 22.00    Types: Cigarettes  . Smokeless tobacco: Never Used  . Tobacco comment: on and off smoker   Substance and Sexual Activity  . Alcohol use: Yes    Alcohol/week: 10.0 standard drinks    Types: 10 Cans of beer per week    Comment: Down to 3 beers a day from 4-6 a day   . Drug use: No  . Sexual activity: Yes    Partners: Female    Comment: Wife  Other Topics Concern  . Not on file  Social History Narrative   Christian Cole grew up in [place]. He is currently living in [place] with his wife. This is his second marriage.  He has 1 daughter and 2 sons from his first marriage. He has 4 step kids. His daughter lives in [place], one son in [place], [place] and the other son in [place], [place]. 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    He enjoys wood working on his spare time. 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       He retired 04/2019 and enjoys this       2 brothers and 2 sisters he is next to youngest    Social Determinants of Radio broadcast assistant Strain:   . Difficulty of Paying Living Expenses:   Food Insecurity:   . Worried About Charity fundraiser in the Last Year:   . Arboriculturist in the Last Year:   Transportation Needs:   . Film/video editor (Medical):   Marland Kitchen Lack of Transportation (Non-Medical):   Physical Activity:    . Days of Exercise per Week:   . Minutes of Exercise per Session:   Stress:   . Feeling of Stress :   Social Connections:   . Frequency of Communication with Friends and Family:   . Frequency of Social Gatherings with Friends and Family:   . Attends Religious Services:   . Active Member of Clubs or Organizations:   . Attends Archivist Meetings:   Marland Kitchen Marital Status:   Intimate Partner Violence:   . Fear of Current or Ex-Partner:   . Emotionally Abused:   Marland Kitchen Physically Abused:   . Sexually Abused:    Social History   Social History Narrative   [name] grew up in [place]. He is currently living in [place] with his wife. This is his second marriage. He has 1 daughter and 2 sons from his first marriage. He has 4 step kids. His daughter lives in [place], one son in [place], [place] and the other son in [place], [place]. 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    He enjoys wood working on his spare time. 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       He retired 04/2019 and enjoys this       2 brothers and 2 sisters he is next to youngest      ROS: Negative.     PE: HEENT: Negative. Lungs: Clear. Cardio: RRForest Cole Christian Cole 04/15/2020

## 2020-04-15 NOTE — Op Note (Signed)
[facility] Patient Name: Christian Cole Procedure Date: 04/15/2020 10:22 AM MRN: [id number] Account #: [id number] Date of Birth: Jan 05, 1951 Admit Type: Outpatient Age: 69 Room: [facility] ENDO ROOM 1 Gender: Male Note Status: Finalized Procedure:             Colonoscopy Indications:           High risk colon cancer surveillance: Personal history                         of colonic polyps Providers:             Robert Bellow, MD Medicines:             Monitored Anesthesia Care Complications:         No immediate complications. Procedure:             Pre-Anesthesia Assessment:                        - Prior to the procedure, a History and Physical was                         performed, and patient medications, allergies and                         sensitivities were reviewed. The patient's tolerance                         of previous anesthesia was reviewed.                        - The risks and benefits of the procedure and the                         sedation options and risks were discussed with the                         patient. All questions were answered and informed                         consent was obtained.                        After obtaining informed consent, the colonoscope was                         passed under direct vision. Throughout the procedure,                         the patient's blood pressure, pulse, and oxygen                         saturations were monitored continuously. The                         Colonoscope was introduced through the anus and                         advanced to the the cecum, identified by appendiceal  orifice and ileocecal valve. The colonoscopy was                         somewhat difficult due to a tortuous colon. The                         patient tolerated the procedure well. The quality of                         the bowel preparation was excellent. Findings:    Hemorrhoids were found on perianal exam.      The entire examined colon appeared normal on direct and retroflexion       views. Impression:            - Hemorrhoids found on perianal exam.                        - The entire examined colon is normal on direct and                         retroflexion views.                        - No specimens collected. Recommendation:        - Repeat colonoscopy in 5 years for surveillance. Procedure Code(s):     --- Professional ---                        [phone], Colonoscopy, flexible; diagnostic, including                         collection of specimen(s) by brushing or washing, when                         performed (separate procedure) Diagnosis Code(s):     --- Professional ---                        K64.9, Unspecified hemorrhoids                        Z86.010, Personal history of colonic polyps CPT copyright 2019 American Medical Association. All rights reserved. The codes documented in this report are preliminary and upon coder review may  be revised to meet current compliance requirements. Robert Bellow, MD 04/15/2020 11:10:55 AM This report has been signed electronically. Number of Addenda: 0 Note Initiated On: 04/15/2020 10:22 AM Scope Withdrawal Time: 0 hours 11 minutes 21 seconds  Total Procedure Duration: 0 hours 17 minutes 45 seconds  Estimated Blood Loss:  Estimated blood loss: none.      [facility]

## 2020-04-18 ENCOUNTER — Encounter: Payer: Self-pay | Admitting: *Deleted

## 2020-04-18 LAB — SURGICAL PATHOLOGY

## 2020-04-27 ENCOUNTER — Other Ambulatory Visit: Payer: Self-pay

## 2020-04-27 DIAGNOSIS — N5201 Erectile dysfunction due to arterial insufficiency: Secondary | ICD-10-CM

## 2020-04-28 ENCOUNTER — Other Ambulatory Visit: Payer: Medicare Other

## 2020-04-28 ENCOUNTER — Other Ambulatory Visit: Payer: Self-pay

## 2020-04-28 DIAGNOSIS — N5201 Erectile dysfunction due to arterial insufficiency: Secondary | ICD-10-CM

## 2020-05-02 LAB — TESTOSTERONE,FREE AND TOTAL
Testosterone, Free: 11.2 pg/mL (ref 6.6–18.1)
Testosterone: 247 ng/dL — ABNORMAL LOW (ref 264–916)

## 2020-05-04 ENCOUNTER — Telehealth: Payer: Self-pay | Admitting: *Deleted

## 2020-05-04 NOTE — Telephone Encounter (Signed)
Notified patient as instructed, patient would like to start on the injections. We will schedule him a teaching at the office . After he get the medication .

## 2020-05-04 NOTE — Telephone Encounter (Signed)
-----   Message from Abbie Sons, MD sent at 05/03/2020  6:47 PM EDT ----- Repeat testosterone level was low.  Does he desire to start replacement and if so is he more interested in topical gel or injection?

## 2020-05-05 MED ORDER — TESTOSTERONE CYPIONATE 200 MG/ML IM SOLN
200.0000 mg | INTRAMUSCULAR | 0 refills | Status: DC
Start: 2020-05-05 — End: 2020-06-08

## 2020-05-05 NOTE — Addendum Note (Signed)
Addended by: John Giovanni C on: 05/05/2020 11:25 AM   Modules accepted: Orders

## 2020-05-05 NOTE — Telephone Encounter (Signed)
Patient states he will call us when he gets his testosterone to scheduled appt

## 2020-05-05 NOTE — Telephone Encounter (Signed)
Patient stes he will cqall [place] when he gets his testosterone to scheduled appt

## 2020-05-06 ENCOUNTER — Telehealth: Payer: Self-pay | Admitting: Urology

## 2020-05-06 ENCOUNTER — Other Ambulatory Visit: Payer: Self-pay | Admitting: *Deleted

## 2020-05-06 MED ORDER — BD SAFETYGLIDE NEEDLE 18G X 1-1/2" MISC
0 refills | Status: DC
Start: 2020-05-06 — End: 2021-01-06

## 2020-05-06 MED ORDER — BD LUER-LOK SYRINGE 21G X 1-1/2" 3 ML MISC
0 refills | Status: DC
Start: 2020-05-06 — End: 2020-09-18

## 2020-05-06 NOTE — Telephone Encounter (Signed)
PATIENT IS SCHEDULED FOR HIS TESTOSTERONE INJECTION TRAINING FOR Monday 05-09-20 HE WILL BRING HIS MEDICINE BUT HE SAID HE WAS NOT GIVEN AND NEEDLES CAN WE CALL SOME IN PLEASE   THANKS

## 2020-05-09 ENCOUNTER — Ambulatory Visit (INDEPENDENT_AMBULATORY_CARE_PROVIDER_SITE_OTHER): Payer: Medicare Other

## 2020-05-09 ENCOUNTER — Other Ambulatory Visit: Payer: Self-pay

## 2020-05-09 DIAGNOSIS — E291 Testicular hypofunction: Secondary | ICD-10-CM

## 2020-05-09 MED ORDER — TESTOSTERONE CYPIONATE 200 MG/ML IM SOLN
200.0000 mg | Freq: Once | INTRAMUSCULAR | Status: AC
  Administered 2020-05-09: 200 mg via INTRAMUSCULAR

## 2020-05-09 NOTE — Progress Notes (Signed)
Testosterone IM Injection  Due to Hypogonadism patient is present today for a Testosterone Injection Teaching. Per Dr. Bernardo Heater patient is to start testosterone replacement therapy 40ml every two weeks. Patient wishes to give him self injections. Patient was given detailed verbal instructions along with demonstration and printed reminder of IM injection instructions and medication drawing.   Medication: Testosterone Cypionate Dose: 28ml Location: right thigh Lot: [id number] Exp:08/2021  Patient tolerated well, no complications were noted Patient was able to demonstrate medication draw up and injection with out difficulty. Patient verbalized his current dose and understands when he is to follow up.   Preformed by: patient with supervision of Fonnie Jarvis, CMA   Follow up: 4 wk lab draw and visit after for follow up

## 2020-05-09 NOTE — Patient Instructions (Signed)

## 2020-06-03 ENCOUNTER — Other Ambulatory Visit: Payer: Self-pay

## 2020-06-03 DIAGNOSIS — E291 Testicular hypofunction: Secondary | ICD-10-CM

## 2020-06-06 ENCOUNTER — Other Ambulatory Visit: Payer: Medicare Other

## 2020-06-06 ENCOUNTER — Other Ambulatory Visit: Payer: Self-pay

## 2020-06-06 DIAGNOSIS — E291 Testicular hypofunction: Secondary | ICD-10-CM | POA: Diagnosis not present

## 2020-06-07 LAB — TESTOSTERONE: Testosterone: 242 ng/dL — ABNORMAL LOW (ref 264–916)

## 2020-06-08 ENCOUNTER — Other Ambulatory Visit: Payer: Self-pay

## 2020-06-08 ENCOUNTER — Encounter: Payer: Self-pay | Admitting: Urology

## 2020-06-08 ENCOUNTER — Ambulatory Visit (INDEPENDENT_AMBULATORY_CARE_PROVIDER_SITE_OTHER): Payer: Medicare Other | Admitting: Urology

## 2020-06-08 VITALS — BP 138/71 | HR 55 | Ht 71.0 in | Wt 213.0 lb

## 2020-06-08 DIAGNOSIS — E291 Testicular hypofunction: Secondary | ICD-10-CM

## 2020-06-08 MED ORDER — TESTOSTERONE CYPIONATE 200 MG/ML IM SOLN
300.0000 mg | INTRAMUSCULAR | 0 refills | Status: DC
Start: 2020-06-08 — End: 2020-09-18

## 2020-06-08 MED ORDER — "SYRINGE/NEEDLE (DISP) 23G X 1-1/2"" 3 ML MISC": 0 refills | Status: DC

## 2020-06-08 NOTE — Progress Notes (Signed)
06/08/2020 11:05 AM   Christian Cole [birth year] [id number]  Referring provider: [name], Nino Glow, MD [place],  [place] [address] Chief Complaint  Patient presents with  . Erectile Dysfunction    HPI: Christian Cole is a 69 y.o. male who presents for a follow up of hypogonadism  -Symptoms tiredness fatigue decreased muscle mass -LH slightly elevated 10.9, T levels 242 and 247 -Started testosterone injections 6 weeks ago - He remains on testosterone injections every 2 weeks  -Testosterone level was 242 on 06/06/2020 (trough level) -He felt symptoms were improved. -Penile implant at [facility] approximately 1 year ago, doing well  PMH: Past Medical History:  Diagnosis Date  . CAD (coronary artery disease)    a. s/p CABG in 2000 with LIMA-LAD, RIMA-PDA, SVG-D1, and SVG-PLA b. low-risk NST in 2016  . Cancer (HCC)    skin cancer follows Dr. Kellie Moor   . ED (erectile dysfunction)   . GERD (gastroesophageal reflux disease)   . Hyperlipidemia   . Hypertension    Followed by Dr. Claiborne Billings  . OSA on CPAP   . Sinus bradycardia   . Thrombocytopenia [facility])     Surgical History: Past Surgical History:  Procedure Laterality Date  . CATARACT EXTRACTION, BILATERAL  2010  . COLONOSCOPY WITH PROPOFOL N/A 04/15/2020   Procedure: COLONOSCOPY WITH PROPOFOL;  Surgeon: Robert Bellow, MD;  Location: [facility] ENDOSCOPY;  Service: Endoscopy;  Laterality: N/A;  . CORONARY ARTERY BYPASS GRAFT  2000   LIMA to the LAD,RIMA to the PDA,vein graft to the diagonal & vein graft to the PLA  . ESOPHAGOGASTRODUODENOSCOPY     Dr. Gustavo Lah  . ESOPHAGOGASTRODUODENOSCOPY  04/15/2020   Procedure: ESOPHAGOGASTRODUODENOSCOPY (EGD);  Surgeon: Robert Bellow, MD;  Location: [facility] ENDOSCOPY;  Service: Endoscopy;;  . KNEE ARTHROSCOPY  1991  . KNEE DEBRIDEMENT  1989   Fluid flush  . SHOULDER ARTHROSCOPY  2011  . SHOULDER SURGERY  2010   bone spur   . US ECHOCARDIOGRAPHY  12/12/2010   mild LA  dilatation, normal LV systolic fx, EF > 31%    Home Medications:  Allergies as of 06/08/2020   No Known Allergies     Medication List       Accurate as of June 08, 2020 11:05 AM. If you have any questions, ask your nurse or doctor.        amLODipine 5 MG tablet Commonly known as: NORVASC Take 1 tablet (5 mg total) by mouth daily.   atorvastatin 40 MG tablet Commonly known as: LIPITOR TAKE 1 TABLET DAILY AT 6 P.M. (KEEP OFFICE VISIT)   B-D 3CC LUER-LOK SYR 21GX1-1/2 21G X 1-1/2" 3 ML Misc Generic drug: SYRINGE-NEEDLE (DISP) 3 ML Use this needle to injection   BD SafetyGlide Needle 18G X 1-1/2" Misc Generic drug: NEEDLE (DISP) 18 G Draw up medication with this needle   benazepril 40 MG tablet Commonly known as: LOTENSIN TAKE 1 TABLET DAILY (OFFICE VISIT NEEDED)   buPROPion 150 MG 12 hr tablet Commonly known as: Wellbutrin SR Daily x 3 days and then bid   clopidogrel 75 MG tablet Commonly known as: PLAVIX Take 1 tablet (75 mg total) by mouth daily.   ezetimibe 10 MG tablet Commonly known as: ZETIA Take 1 tablet (10 mg total) by mouth daily.   pantoprazole 40 MG tablet Commonly known as: PROTONIX take 1 tablet by mouth once daily 30 minutes BEFORE A MEAL   testosterone cypionate 200 MG/ML injection Commonly known as: DEPOTESTOSTERONE  CYPIONATE Inject 1 mL (200 mg total) into the muscle every 14 (fourteen) days.       Allergies: No Known Allergies  Family History: Family History  Problem Relation Age of Onset  . Arthritis Mother   . Heart disease Father        CAD - MI  . Stroke Father   . Hypertension Father   . Cancer Father 89       esophageal cancer   . Arthritis Maternal Grandmother     Social History:  reports that he has been smoking cigarettes. He has a 22.00 pack-year smoking history. He has never used smokeless tobacco. He reports current alcohol use of about 10.0 standard drinks of alcohol per week. He reports that he does not use  drugs.   Physical Exam: BP (!) 138/71   Pulse 55   Ht 5\' 11"  (1.803 m)   Wt (!) 213 lb (96.6 kg)   BMI 29.71 kg/m   Constitutional:  Alert and oriented, No acute distress. HEENT: [place] AT, moist mucus membranes.  Trachea midline, no masses. Cardiovascular: No clubbing, cyanosis, or edema. Respiratory: Normal respiratory effort, no increased work of breathing. Skin: No rashes, bruises or suspicious lesions. Neurologic: Grossly intact, no focal deficits, moving all 4 extremities. Psychiatric: Normal mood and affect.   Assessment & Plan:    1. Hypogonadism -Trough testosterone level was low at 217 -Will increase to 300 mg every 2 weeks  -Rx refilled -Midcycle testosterone recheck in 4-6 weeks   [place] [address] [place], [place] [address] [phone]  I, Joneen Boers Peace, am acting as a Education administrator for Dr. Nicki Reaper C. Aniyla Harling.  I have reviewed the above documentation for accuracy and completeness, and I agree with the above.   Abbie Sons, MD

## 2020-06-15 ENCOUNTER — Encounter: Payer: Self-pay | Admitting: Internal Medicine

## 2020-06-15 ENCOUNTER — Other Ambulatory Visit: Payer: Self-pay

## 2020-06-15 ENCOUNTER — Ambulatory Visit (INDEPENDENT_AMBULATORY_CARE_PROVIDER_SITE_OTHER): Payer: Medicare Other | Admitting: Internal Medicine

## 2020-06-15 VITALS — BP 140/80 | HR 52 | Temp 98.0°F | Ht 71.34 in | Wt 221.2 lb

## 2020-06-15 DIAGNOSIS — E669 Obesity, unspecified: Secondary | ICD-10-CM | POA: Diagnosis not present

## 2020-06-15 DIAGNOSIS — R001 Bradycardia, unspecified: Secondary | ICD-10-CM | POA: Diagnosis not present

## 2020-06-15 DIAGNOSIS — I10 Essential (primary) hypertension: Secondary | ICD-10-CM | POA: Diagnosis not present

## 2020-06-15 DIAGNOSIS — E785 Hyperlipidemia, unspecified: Secondary | ICD-10-CM

## 2020-06-15 DIAGNOSIS — E538 Deficiency of other specified B group vitamins: Secondary | ICD-10-CM | POA: Diagnosis not present

## 2020-06-15 DIAGNOSIS — R7303 Prediabetes: Secondary | ICD-10-CM | POA: Diagnosis not present

## 2020-06-15 DIAGNOSIS — N281 Cyst of kidney, acquired: Secondary | ICD-10-CM | POA: Insufficient documentation

## 2020-06-15 LAB — CBC WITH DIFFERENTIAL/PLATELET
Basophils Absolute: 0 10*3/uL (ref 0.0–0.1)
Basophils Relative: 0.4 % (ref 0.0–3.0)
Eosinophils Absolute: 0.1 10*3/uL (ref 0.0–0.7)
Eosinophils Relative: 1.6 % (ref 0.0–5.0)
HCT: 41.9 % (ref 39.0–52.0)
Hemoglobin: 14 g/dL (ref 13.0–17.0)
Lymphocytes Relative: 39.4 % (ref 12.0–46.0)
Lymphs Abs: 2.4 10*3/uL (ref 0.7–4.0)
MCHC: 33.5 g/dL (ref 30.0–36.0)
MCV: 98.5 fl (ref 78.0–100.0)
Monocytes Absolute: 0.6 10*3/uL (ref 0.1–1.0)
Monocytes Relative: 9.9 % (ref 3.0–12.0)
Neutro Abs: 3 10*3/uL (ref 1.4–7.7)
Neutrophils Relative %: 48.7 % (ref 43.0–77.0)
Platelets: 139 10*3/uL — ABNORMAL LOW (ref 150.0–400.0)
RBC: 4.25 Mil/uL (ref 4.22–5.81)
RDW: 14.2 % (ref 11.5–15.5)
WBC: 6.2 10*3/uL (ref 4.0–10.5)

## 2020-06-15 LAB — COMPREHENSIVE METABOLIC PANEL
ALT: 21 U/L (ref 0–53)
AST: 21 U/L (ref 0–37)
Albumin: 4.5 g/dL (ref 3.5–5.2)
Alkaline Phosphatase: 71 U/L (ref 39–117)
BUN: 13 mg/dL (ref 6–23)
CO2: 26 mEq/L (ref 19–32)
Calcium: 9.3 mg/dL (ref 8.4–10.5)
Chloride: 105 mEq/L (ref 96–112)
Creatinine, Ser: 0.93 mg/dL (ref 0.40–1.50)
GFR: 80.24 mL/min (ref >60.00)
Glucose, Bld: 99 mg/dL (ref 70–99)
Potassium: 4.1 mEq/L (ref 3.5–5.1)
Sodium: 138 mEq/L (ref 135–145)
Total Bilirubin: 0.8 mg/dL (ref 0.2–1.2)
Total Protein: 6.7 g/dL (ref 6.0–8.3)

## 2020-06-15 LAB — VITAMIN B12: Vitamin B-12: 621 pg/mL (ref 211–911)

## 2020-06-15 LAB — LIPID PANEL
Cholesterol: 124 mg/dL (ref 0–200)
HDL: 52.4 mg/dL (ref >39.00)
LDL Cholesterol: 45 mg/dL (ref 0–99)
NonHDL: 71.14
Total CHOL/HDL Ratio: 2
Triglycerides: 129 mg/dL (ref 0.0–149.0)
VLDL: 25.8 mg/dL (ref 0.0–40.0)

## 2020-06-15 LAB — HEMOGLOBIN A1C: Hgb A1c MFr Bld: 5.6 % (ref 4.6–6.5)

## 2020-06-15 NOTE — Progress Notes (Signed)
Patient states he is ready to stop smoking. Wellbutrin did not work and Chantix is not covered by Insurance underwriter.   Patient flagged: Current status:  PATIENT IS OVERDUE FOR BMI FOLLOW UP PLAN BMI is estimated to be 30.6 based on the last recorded weight and height

## 2020-06-15 NOTE — Progress Notes (Signed)
Chief Complaint  Patient presents with  . Follow-up   F/u  1. HTN takes BP meds at night on norvasc 5 mg and lotensin 40 mg qd BP at home running 120s/80s or less on top  2. Smoking long term CT chest 01/2020 emphysema wants to quit but wellbutrin did not help and wife is smoker which is a trigger and insurance will not cover chantix  3. Prediabetes  Check A1C today    Review of Systems  Constitutional: Negative for weight loss.  HENT: Negative for hearing loss.   Eyes: Negative for blurred vision.  Respiratory: Negative for shortness of breath.   Cardiovascular: Negative for chest pain.  Gastrointestinal: Negative for abdominal pain.  Musculoskeletal: Negative for falls.  Skin: Negative for rash.  Neurological: Negative for headaches.  Psychiatric/Behavioral: Negative for depression.   Past Medical History:  Diagnosis Date  . CAD (coronary artery disease)    a. s/p CABG in 2000 with LIMA-LAD, RIMA-PDA, SVG-D1, and SVG-PLA b. low-risk NST in 2016  . Cancer (HCC)    skin cancer follows Dr. Kellie Moor   . ED (erectile dysfunction)   . GERD (gastroesophageal reflux disease)   . Hyperlipidemia   . Hypertension    Followed by Dr. Claiborne Billings  . OSA on CPAP   . Sinus bradycardia   . Thrombocytopenia ([place])    Past Surgical History:  Procedure Laterality Date  . CATARACT EXTRACTION, BILATERAL  2010  . COLONOSCOPY WITH PROPOFOL N/A 04/15/2020   Procedure: COLONOSCOPY WITH PROPOFOL;  Surgeon: Robert Bellow, MD;  Location: [facility] ENDOSCOPY;  Service: Endoscopy;  Laterality: N/A;  . CORONARY ARTERY BYPASS GRAFT  2000   LIMA to the LAD,RIMA to the PDA,vein graft to the diagonal & vein graft to the PLA  . ESOPHAGOGASTRODUODENOSCOPY     Dr. Gustavo Lah  . ESOPHAGOGASTRODUODENOSCOPY  04/15/2020   Procedure: ESOPHAGOGASTRODUODENOSCOPY (EGD);  Surgeon: Robert Bellow, MD;  Location: [facility] ENDOSCOPY;  Service: Endoscopy;;  . KNEE ARTHROSCOPY  1991  . KNEE DEBRIDEMENT  1989   Fluid flush  .  SHOULDER ARTHROSCOPY  2011  . SHOULDER SURGERY  2010   bone spur   . US ECHOCARDIOGRAPHY  12/12/2010   mild LA dilatation, normal LV systolic fx, EF > 81%   Family History  Problem Relation Age of Onset  . Arthritis Mother   . Heart disease Father        CAD - MI  . Stroke Father   . Hypertension Father   . Cancer Father 45       esophageal cancer   . Arthritis Maternal Grandmother    Social History   Socioeconomic History  . Marital status: Married    Spouse name: Not on file  . Number of children: 3  . Years of education: 3  . Highest education level: Not on file  Occupational History  . Occupation: Retired Corporate treasurer  . Occupation: Management/Technical Support    Comment: Psychiatric nurse  Tobacco Use  . Smoking status: Current Every Day Smoker    Packs/day: 0.50    Years: 44.00    Pack years: 22.00    Types: Cigarettes  . Smokeless tobacco: Never Used  . Tobacco comment: on and off smoker   Substance and Sexual Activity  . Alcohol use: Yes    Alcohol/week: 10.0 standard drinks    Types: 10 Cans of beer per week    Comment: Down to 3 beers a day from 4-6 a day   . Drug use: No  .  Sexual activity: Yes    Partners: Female    Comment: Wife  Other Topics Concern  . Not on file  Social History Narrative   Hesham grew up in [place]. He is currently living in [place] with his wife. This is his second marriage. He has 1 daughter and 2 sons from his first marriage. He has 4 step kids. His daughter lives in [place], one son in [place], [place] and the other son in [place], [place]. 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    He enjoys wood working on his spare time. 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       He retired 04/2019 and enjoys this       2 brothers and 2 sisters he is next to youngest    Social  Determinants of Radio broadcast assistant Strain:   . Difficulty of Paying Living Expenses:   Food Insecurity:   . Worried About Charity fundraiser in the Last Year:   . Arboriculturist in the Last Year:   Transportation Needs:   . Film/video editor (Medical):   Marland Kitchen Lack of Transportation (Non-Medical):   Physical Activity:   . Days of Exercise per Week:   . Minutes of Exercise per Session:   Stress:   . Feeling of Stress :   Social Connections:   . Frequency of Communication with Friends and Family:   . Frequency of Social Gatherings with Friends and Family:   . Attends Religious Services:   . Active Member of Clubs or Organizations:   . Attends Archivist Meetings:   Marland Kitchen Marital Status:   Intimate Partner Violence:   . Fear of Current or Ex-Partner:   . Emotionally Abused:   Marland Kitchen Physically Abused:   . Sexually Abused:    Current Meds  Medication Sig  . atorvastatin (LIPITOR) 40 MG tablet TAKE 1 TABLET DAILY AT 6 P.M. (KEEP OFFICE VISIT)  . benazepril (LOTENSIN) 40 MG tablet TAKE 1 TABLET DAILY (OFFICE VISIT NEEDED)  . clopidogrel (PLAVIX) 75 MG tablet Take 1 tablet (75 mg total) by mouth daily.  Marland Kitchen ezetimibe (ZETIA) 10 MG tablet Take 1 tablet (10 mg total) by mouth daily.  Marland Kitchen NEEDLE, DISP, 18 G (BD SAFETYGLIDE NEEDLE) 18G X 1-1/2" MISC Draw up medication with this needle  . pantoprazole (PROTONIX) 40 MG tablet take 1 tablet by mouth once daily 30 minutes BEFORE A MEAL  . SYRINGE-NEEDLE, DISP, 3 ML (B-D 3CC LUER-LOK SYR 21GX1-1/2) 21G X 1-1/2" 3 ML MISC Use this needle to injection  . SYRINGE-NEEDLE, DISP, 3 ML 23G X 1-1/2" 3 ML MISC Use as directed  . testosterone cypionate (DEPOTESTOSTERONE CYPIONATE) 200 MG/ML injection Inject 1.5 mLs (300 mg total) into the muscle every 14 (fourteen) days.  . [DISCONTINUED] buPROPion (WELLBUTRIN SR) 150 MG 12 hr tablet Daily x 3 days and then bid   No Known Allergies Recent Results (from the past 2160 hour(s))  Testosterone      Status: None   Collection Time: 03/24/20  9:00 AM  Result Value Ref Range   Testosterone 276 264 - 916 ng/dL    Comment: Adult male reference interval is based on a population of healthy nonobese males (BMI <30) between 38 and 60 years old. [place], [place] [phone].  PMID: [id number].               **Please note reference interval change**   SARS CORONAVIRUS 2 (TAT 6-24 HRS) Nasopharyngeal Nasopharyngeal Swab     Status: None   Collection Time: 04/13/20 10:49 AM   Specimen: Nasopharyngeal Swab  Result Value Ref Range   SARS Coronavirus 2 NEGATIVE NEGATIVE    Comment: (NOTE) SARS-CoV-2 target nucleic acids are NOT DETECTED. The SARS-CoV-2 RNA is generally detectable in upper and lower respiratory specimens during the acute phase of infection. Negative results do not preclude SARS-CoV-2 infection, do not rule out co-infections with other pathogens, and should not be used as the sole basis for treatment or other patient management decisions. Negative results must be combined with clinical observations, patient history, and epidemiological information. The expected result is Negative. Fact Sheet for Patients: SugarRoll.be Fact Sheet for Healthcare Providers: https://www.woods-mathews.com/ This test is not yet approved or cleared by the [place] FDA and  has been authorized for detection and/or diagnosis of SARS-CoV-2 by FDA under an Emergency Use Authorization (EUA). This EUA will remain  in effect (meaning this test can be used) for the duration of the COVID-19 declaration under Section 56 4(b)(1) of the Act, 21 U.S.C. section 360bbb-3(b)(1), unless the authorization is terminated or revoked sooner. Performed at [facility], [place] [address], [place], [place] [address]   Surgical pathology     Status: None   Collection Time: 04/15/20 10:39 AM  Result Value Ref Range   SURGICAL PATHOLOGY      SURGICAL  PATHOLOGY CASE: [id number] PATIENT: Sudeep Hammer Surgical Pathology Report     Specimen Submitted: A. Stomach, antrum; cbx B. Esophagus, distal; cbx  Clinical History: PH polyps.  Distal esophagitis and gastritis     DIAGNOSIS: A.  STOMACH, ANTRUM; COLD BIOPSY: - ANTRAL MUCOSA WITH MODERATE REACTIVE GASTROPATHY. - OXYNTIC MUCOSA WITHOUT PATHOLOGIC CHANGES. - NEGATIVE FOR H. PYLORI, INTESTINAL METAPLASIA, DYSPLASIA, AND MALIGNANCY.  B.  ESOPHAGUS, DISTAL; COLD BIOPSY: - SQUAMOCOLUMNAR MUCOSA WITH MILD CHRONIC ACTIVE INFLAMMATION AND REACTIVE SQUAMOUS EPITHELIAL CHANGES, CONSISTENT WITH REFLUX ESOPHAGITIS. - NEGATIVE FOR GOBLET CELLS, DYSPLASIA, AND MALIGNANCY.  GROSS DESCRIPTION: A. Labeled: C BX gastric antrum Received: In formalin Tissue fragment(s): 3 Size: 0.6 x 0.5 x 0.1 cm Description: Fragments of tan soft tissue Entirely submitted in A1.  B. Labeled: C BX distal esophagus Received: In formalin Tissue fragment(s): Multiple S ize: 0.5 x 0.5 x 0.1 cm Description: Fragments of light tan soft tissue Entirely submitted in B1.   Final Diagnosis performed by Bryan Lemma, MD.   Electronically signed 04/18/2020 6:29:42PM The electronic signature indicates that the named Attending Pathologist has evaluated the specimen Technical component performed at [facility], [address] [place], [place] [address] Lab: [phone] Dir: Rush Farmer, MD, MMM  Professional component performed at [facility], [facility], [place], [place],  [address] Lab: [phone] Dir: Dellia Nims. Rubinas, MD   Testosterone,Free and Total     Status: Abnormal   Collection Time: 04/28/20 10:51 AM  Result Value Ref Range   Testosterone 247 (L) 264 - 916 ng/dL    Comment: Adult male reference interval is based on a population of healthy nonobese males (BMI <30) between 36 and 67 years old. [place], [place] [phone]. PMID: [id number].     Testosterone, Free 11.2 6.6 - 18.1 pg/mL  Testosterone     Status: Abnormal   Collection Time: 06/06/20  9:57 AM  Result Value Ref Range   Testosterone  242 (L) 264 - 916 ng/dL    Comment: Adult male reference interval is based on a population of healthy nonobese males (BMI <30) between 23 and 83 years old. [place], [place] [phone]. PMID: [id number].    Objective  Body mass index is 30.56 kg/m. Wt Readings from Last 3 Encounters:  06/15/20 221 lb 3.2 oz (100.3 kg)  06/08/20 (!) 213 lb (96.6 kg)  04/15/20 214 lb (97.1 kg)   Temp Readings from Last 3 Encounters:  06/15/20 98 F (36.7 C) (Oral)  04/15/20 (!) 97.1 F (36.2 C) (Temporal)  06/09/19 (!) 97 F (36.1 C)   BP Readings from Last 3 Encounters:  06/15/20 140/80  06/08/20 (!) 138/71  04/15/20 123/71   Pulse Readings from Last 3 Encounters:  06/15/20 (!) 52  06/08/20 55  04/15/20 (!) 52    Physical Exam Vitals and nursing note reviewed.  Constitutional:      Appearance: Normal appearance. He is well-developed and well-groomed. He is obese.  HENT:     Head: Normocephalic and atraumatic.  Eyes:     Conjunctiva/sclera: Conjunctivae normal.     Pupils: Pupils are equal, round, and reactive to light.  Cardiovascular:     Rate and Rhythm: Normal rate and regular rhythm.     Heart sounds: Normal heart sounds. No murmur heard.   Pulmonary:     Effort: Pulmonary effort is normal.     Breath sounds: Normal breath sounds.  Skin:    General: Skin is warm and dry.  Neurological:     General: No focal deficit present.     Mental Status: He is alert and oriented to person, place, and time. Mental status is at baseline.     Gait: Gait normal.  Psychiatric:        Attention and Perception: Attention and perception normal.        Mood and Affect: Mood and affect normal.        Speech: Speech normal.        Behavior: Behavior normal. Behavior is cooperative.        Thought Content: Thought content normal.         Cognition and Memory: Cognition and memory normal.        Judgment: Judgment normal.     Assessment  Plan  Essential hypertension - Plan: Lipid panel, Comprehensive metabolic panel, CBC with Differential/Platelet Cont meds takes qhs goal <130/<80   Prediabetes - Plan: Hemoglobin A1c Hyperlipidemia, unspecified hyperlipidemia type - Plan: Lipid panel  Sinus bradycardia W/o sx's   Vitamin B12 deficiency - Plan: B12  Obesity (BMI 30-39.9)  rec exercise and healthy diet   HM Declinedflu shot Had prevnar, zostervax,utdpna 23 vaccine Disc shingrix for futureand Tdapgiven Rxgiven today 06/15/20 Disc hepA/B vaccineprev given Rxpreviously with fatty liver  Pfizer 2/2  HadPSA 1.0 12/17/19   Hep C neg Has mvt with vitamin D3   CT chesthad3/2021 rec smoking cessation    US abdomen1/29/19 neg normal spleen size, +fatty liver, 1.3 cm right kidney cyst  Colonoscopy had 02/16/14 tubular adenoma h[facility]o polyps [facility] [place] [facility]colonoscopy 04/15/20 repeat Q5 years Dr. Bary Castilla  Sees derm Dr. Langston Reusing 2019SCC left arm x 2 f/ufor 2020-2021 bx bcc forearm left sch spring 2021 right nose bx negative Q6 months to 1 year  Feeling great f/u [facility] Dr. Patrick North [phone] fax [phone] benefits CPAP f/u in 1 year  Local meds [facility] protonix, wellbutrin  chronic  [facility]  Provider: Dr. [name] [name]-Internal Medicine

## 2020-06-15 NOTE — Patient Instructions (Addendum)
1800 QUIT NOW  D3 vitamin 4000 IU daily    Zoster Vaccine, Recombinant injection What is this medicine? ZOSTER VACCINE (ZOS ter vak SEEN) is used to prevent shingles in adults 70 years old and over. This vaccine is not used to treat shingles or nerve pain from shingles. This medicine may be used for other purposes; ask your health care provider or pharmacist if you have questions. COMMON BRAND NAME(S): [place] What should I tell my health care provider before I take this medicine? They need to know if you have any of these conditions:  blood disorders or disease  cancer like leukemia or lymphoma  immune system problems or therapy  an unusual or allergic reaction to vaccines, other medications, foods, dyes, or preservatives  pregnant or trying to get pregnant  breast-feeding How should I use this medicine? This vaccine is for injection in a muscle. It is given by a health care professional. Talk to your pediatrician regarding the use of this medicine in children. This medicine is not approved for use in children. Overdosage: If you think you have taken too much of this medicine contact a poison control center or emergency room at once. NOTE: This medicine is only for you. Do not share this medicine with others. What if I miss a dose? Keep appointments for follow-up (booster) doses as directed. It is important not to miss your dose. Call your doctor or health care professional if you are unable to keep an appointment. What may interact with this medicine?  medicines that suppress your immune system  medicines to treat cancer  steroid medicines like prednisone or cortisone This list may not describe all possible interactions. Give your health care provider a list of all the medicines, herbs, non-prescription drugs, or dietary supplements you use. Also tell them if you smoke, drink alcohol, or use illegal drugs. Some items may interact with your medicine. What should I watch for  while using this medicine? Visit your doctor for regular check ups. This vaccine, like all vaccines, may not fully protect everyone. What side effects may I notice from receiving this medicine? Side effects that you should report to your doctor or health care professional as soon as possible:  allergic reactions like skin rash, itching or hives, swelling of the face, lips, or tongue  breathing problems Side effects that usually do not require medical attention (report these to your doctor or health care professional if they continue or are bothersome):  chills  headache  fever  nausea, vomiting  redness, warmth, pain, swelling or itching at site where injected  tiredness This list may not describe all possible side effects. Call your doctor for medical advice about side effects. You may report side effects to FDA at 1-800-FDA-1088. Where should I keep my medicine? This vaccine is only given in a clinic, pharmacy, doctor's office, or other health care setting and will not be stored at home. NOTE: This sheet is a summary. It may not cover all possible information. If you have questions about this medicine, talk to your doctor, pharmacist, or health care provider.  2020 Elsevier/Gold Standard (2017-06-10 13:20:30)  Tdap (Tetanus, Diphtheria, Pertussis) Vaccine: What You Need to Know 1. Why get vaccinated? Tdap vaccine can prevent tetanus, diphtheria, and pertussis. Diphtheria and pertussis spread from person to person. Tetanus enters the body through cuts or wounds.  TETANUS (T) causes painful stiffening of the muscles. Tetanus can lead to serious health problems, including being unable to open the mouth, having trouble swallowing  and breathing, or death.  DIPHTHERIA (D) can lead to difficulty breathing, heart failure, paralysis, or death.  PERTUSSIS (aP), also known as "whooping cough," can cause uncontrollable, violent coughing which makes it hard to breathe, eat, or drink.  Pertussis can be extremely serious in babies and young children, causing pneumonia, convulsions, brain damage, or death. In teens and adults, it can cause weight loss, loss of bladder control, passing out, and rib fractures from severe coughing. 2. Tdap vaccine Tdap is only for children 7 years and older, adolescents, and adults.  Adolescents should receive a single dose of Tdap, preferably at age 44 or 73 years. Pregnant women should get a dose of Tdap during every pregnancy, to protect the newborn from pertussis. Infants are most at risk for severe, life-threatening complications from pertussis. Adults who have never received Tdap should get a dose of Tdap. Also, adults should receive a booster dose every 10 years, or earlier in the case of a severe and dirty wound or burn. Booster doses can be either Tdap or Td (a different vaccine that protects against tetanus and diphtheria but not pertussis). Tdap may be given at the same time as other vaccines. 3. Talk with your health care provider Tell your vaccine provider if the person getting the vaccine:  Has had an allergic reaction after a previous dose of any vaccine that protects against tetanus, diphtheria, or pertussis, or has any severe, life-threatening allergies.  Has had a coma, decreased level of consciousness, or prolonged seizures within 7 days after a previous dose of any pertussis vaccine (DTP, DTaP, or Tdap).  Has seizures or another nervous system problem.  Has ever had Guillain-Barr Syndrome (also called GBS).  Has had severe pain or swelling after a previous dose of any vaccine that protects against tetanus or diphtheria. In some cases, your health care provider may decide to postpone Tdap vaccination to a future visit.  People with minor illnesses, such as a cold, may be vaccinated. People who are moderately or severely ill should usually wait until they recover before getting Tdap vaccine.  Your health care provider can give  you more information. 4. Risks of a vaccine reaction  Pain, redness, or swelling where the shot was given, mild fever, headache, feeling tired, and nausea, vomiting, diarrhea, or stomachache sometimes happen after Tdap vaccine. People sometimes faint after medical procedures, including vaccination. Tell your provider if you feel dizzy or have vision changes or ringing in the ears.  As with any medicine, there is a very remote chance of a vaccine causing a severe allergic reaction, other serious injury, or death. 5. What if there is a serious problem? An allergic reaction could occur after the vaccinated person leaves the clinic. If you see signs of a severe allergic reaction (hives, swelling of the face and throat, difficulty breathing, a fast heartbeat, dizziness, or weakness), call 9-1-1 and get the person to the nearest hospital. For other signs that concern you, call your health care provider.  Adverse reactions should be reported to the Vaccine Adverse Event Reporting System (VAERS). Your health care provider will usually file this report, or you can do it yourself. Visit the VAERS website at www.vaers.SamedayNews.es or call (514) 312-5213. VAERS is only for reporting reactions, and VAERS staff do not give medical advice. 6. The National Vaccine Injury Compensation Program The [employer] Vaccine Injury Compensation Program (VICP) is a federal program that was created to compensate people who may have been injured by certain vaccines. Visit the [facility] website  at GoldCloset.com.ee or call 867-483-1875 to learn about the program and about filing a claim. There is a time limit to file a claim for compensation. 7. How can I learn more?  Ask your health care provider.  Call your local or state health department.  Contact the Centers for Disease Control and Prevention (CDC): ? Call 504-610-0846 (1-800-CDC-INFO) or ? Visit CDC's website at http://hunter.com/ Vaccine Information Statement  Tdap (Tetanus, Diphtheria, Pertussis) Vaccine (02/11/2019) This information is not intended to replace advice given to you by your health care provider. Make sure you discuss any questions you have with your health care provider. Document Revised: 02/20/2019 Document Reviewed: 02/23/2019 Elsevier Patient Education  [place].  Smoking Tobacco Information, Adult Smoking tobacco can be harmful to your health. Tobacco contains a poisonous (toxic), colorless chemical called nicotine. Nicotine is addictive. It changes the brain and can make it hard to stop smoking. Tobacco also has other toxic chemicals that can hurt your body and raise your risk of many cancers. How can smoking tobacco affect me? Smoking tobacco puts you at risk for:  Cancer. Smoking is most commonly associated with lung cancer, but can also lead to cancer in other parts of the body.  Chronic obstructive pulmonary disease (COPD). This is a long-term lung condition that makes it hard to breathe. It also gets worse over time.  High blood pressure (hypertension), heart disease, stroke, or heart attack.  Lung infections, such as pneumonia.  Cataracts. This is when the lenses in the eyes become clouded.  Digestive problems. This may include peptic ulcers, heartburn, and gastroesophageal reflux disease (GERD).  Oral health problems, such as gum disease and tooth loss.  Loss of taste and smell. Smoking can affect your appearance by causing:  Wrinkles.  Yellow or stained teeth, fingers, and fingernails. Smoking tobacco can also affect your social life, because:  It may be challenging to find places to smoke when away from home. Many workplaces, [employer], hotels, and public places are tobacco-free.  Smoking is expensive. This is due to the cost of tobacco and the long-term costs of treating health problems from smoking.  Secondhand smoke may affect those around you. Secondhand smoke can cause lung cancer, breathing  problems, and heart disease. Children of smokers have a higher risk for: ? Sudden infant death syndrome (SIDS). ? Ear infections. ? Lung infections. If you currently smoke tobacco, quitting now can help you:  Lead a longer and healthier life.  Look, smell, breathe, and feel better over time.  Save money.  Protect others from the harms of secondhand smoke. What actions can I take to prevent health problems? Quit smoking   Do not start smoking. Quit if you already do.  Make a plan to quit smoking and commit to it. Look for programs to help you and ask your health care provider for recommendations and ideas.  Set a date and write down all the reasons you want to quit.  Let your friends and family know you are quitting so they can help and support you. Consider finding friends who also want to quit. It can be easier to quit with someone else, so that you can support each other.  Talk with your health care provider about using nicotine replacement medicines to help you quit, such as gum, lozenges, patches, sprays, or pills.  Do not replace cigarette smoking with electronic cigarettes, which are commonly called e-cigarettes. The safety of e-cigarettes is not known, and some may contain harmful chemicals.  If you  try to quit but return to smoking, stay positive. It is common to slip up when you first quit, so take it one day at a time.  Be prepared for cravings. When you feel the urge to smoke, chew gum or suck on hard candy. Lifestyle  Stay busy and take care of your body.  Drink enough fluid to keep your urine pale yellow.  Get plenty of exercise and eat a healthy diet. This can help prevent weight gain after quitting.  Monitor your eating habits. Quitting smoking can cause you to have a larger appetite than when you smoke.  Find ways to relax. Go out with friends or family to a movie or a restaurant where people do not smoke.  Ask your health care provider about having regular  tests (screenings) to check for cancer. This may include blood tests, imaging tests, and other tests.  Find ways to manage your stress, such as meditation, yoga, or exercise. Where to find support To get support to quit smoking, consider:  Asking your health care provider for more information and resources.  Taking classes to learn more about quitting smoking.  Looking for local organizations that offer resources about quitting smoking.  Joining a support group for people who want to quit smoking in your local community.  Calling the smokefree.gov counselor helpline: 1-800-Quit-Now 610 670 9890) Where to find more information You may find more information about quitting smoking from:  HelpGuide.org: www.helpguide.org  https://hall.com/: smokefree.gov  American Lung Association: www.lung.org Contact a health care provider if you:  Have problems breathing.  Notice that your lips, nose, or fingers turn blue.  Have chest pain.  Are coughing up blood.  Feel faint or you pass out.  Have other health changes that cause you to worry. Summary  Smoking tobacco can negatively affect your health, the health of those around you, your finances, and your social life.  Do not start smoking. Quit if you already do. If you need help quitting, ask your health care provider.  Think about joining a support group for people who want to quit smoking in your local community. There are many effective programs that will help you to quit this behavior. This information is not intended to replace advice given to you by your health care provider. Make sure you discuss any questions you have with your health care provider. Document Revised: 07/24/2019 Document Reviewed: 11/13/2016 Elsevier Patient Education  2020 [place] with Quitting Smoking  Quitting smoking is a physical and mental challenge. You will face cravings, withdrawal symptoms, and temptation. Before quitting, work with  your health care provider to make a plan that can help you cope. Preparation can help you quit and keep you from giving in. How can I cope with cravings? Cravings usually last for 5-10 minutes. If you get through it, the craving will pass. Consider taking the following actions to help you cope with cravings:  Keep your mouth busy: ? Chew sugar-free gum. ? Suck on hard candies or a straw. ? Brush your teeth.  Keep your hands and body busy: ? Immediately change to a different activity when you feel a craving. ? Squeeze or play with a ball. ? Do an activity or a hobby, like making bead jewelry, practicing needlepoint, or working with wood. ? Mix up your normal routine. ? Take a short exercise break. Go for a quick walk or run up and down stairs. ? Spend time in public places where smoking is not allowed.  Focus on  doing something kind or helpful for someone else.  Call a friend or family member to talk during a craving.  Join a support group.  Call a quit line, such as 1-800-QUIT-NOW.  Talk with your health care provider about medicines that might help you cope with cravings and make quitting easier for you. How can I deal with withdrawal symptoms? Your body may experience negative effects as it tries to get used to not having nicotine in the system. These effects are called withdrawal symptoms. They may include:  Feeling hungrier than normal.  Trouble concentrating.  Irritability.  Trouble sleeping.  Feeling depressed.  Restlessness and agitation.  Craving a cigarette. To manage withdrawal symptoms:  Avoid places, people, and activities that trigger your cravings.  Remember why you want to quit.  Get plenty of sleep.  Avoid coffee and other caffeinated drinks. These may worsen some of your symptoms. How can I handle social situations? Social situations can be difficult when you are quitting smoking, especially in the first few weeks. To manage this, you can:  Avoid  parties, bars, and other social situations where people might be smoking.  Avoid alcohol.  Leave right away if you have the urge to smoke.  Explain to your family and friends that you are quitting smoking. Ask for understanding and support.  Plan activities with friends or family where smoking is not an option. What are some ways I can cope with stress? Wanting to smoke may cause stress, and stress can make you want to smoke. Find ways to manage your stress. Relaxation techniques can help. For example:  Breathe slowly and deeply, in through your nose and out through your mouth.  Listen to soothing, relaxing music.  Talk with a family member or friend about your stress.  Light a candle.  Soak in a bath or take a shower.  Think about a peaceful place. What are some ways I can prevent weight gain? Be aware that many people gain weight after they quit smoking. However, not everyone does. To keep from gaining weight, have a plan in place before you quit and stick to the plan after you quit. Your plan should include:  Having healthy snacks. When you have a craving, it may help to: ? Eat plain popcorn, crunchy carrots, celery, or other cut vegetables. ? Chew sugar-free gum.  Changing how you eat: ? Eat small portion sizes at meals. ? Eat 4-6 small meals throughout the day instead of 1-2 large meals a day. ? Be mindful when you eat. Do not watch television or do other things that might distract you as you eat.  Exercising regularly: ? Make time to exercise each day. If you do not have time for a long workout, do short bouts of exercise for 5-10 minutes several times a day. ? Do some form of strengthening exercise, like weight lifting, and some form of aerobic exercise, like running or swimming.  Drinking plenty of water or other low-calorie or no-calorie drinks. Drink 6-8 glasses of water daily, or as much as instructed by your health care provider. Summary  Quitting smoking is a  physical and mental challenge. You will face cravings, withdrawal symptoms, and temptation to smoke again. Preparation can help you as you go through these challenges.  You can cope with cravings by keeping your mouth busy (such as by chewing gum), keeping your body and hands busy, and making calls to family, friends, or a helpline for people who want to quit smoking.  You  can cope with withdrawal symptoms by avoiding places where people smoke, avoiding drinks with caffeine, and getting plenty of rest.  Ask your health care provider about the different ways to prevent weight gain, avoid stress, and handle social situations. This information is not intended to replace advice given to you by your health care provider. Make sure you discuss any questions you have with your health care provider. Document Revised: 10/11/2017 Document Reviewed: 10/26/2016 Elsevier Patient Education  2020 [place].    Prediabetes Eating Plan Prediabetes is a condition that causes blood sugar (glucose) levels to be higher than normal. This increases the risk for developing diabetes. In order to prevent diabetes from developing, your health care provider may recommend a diet and other lifestyle changes to help you:  Control your blood glucose levels.  Improve your cholesterol levels.  Manage your blood pressure. Your health care provider may recommend working with a diet and nutrition specialist (dietitian) to make a meal plan that is best for you. What are tips for following this plan? Lifestyle  Set weight loss goals with the help of your health care team. It is recommended that most people with prediabetes lose 7% of their current body weight.  Exercise for at least 30 minutes at least 5 days a week.  Attend a support group or seek ongoing support from a mental health counselor.  Take over-the-counter and prescription medicines only as told by your health care provider. Reading food labels  Read food  labels to check the amount of fat, salt (sodium), and sugar in prepackaged foods. Avoid foods that have: ? Saturated fats. ? Trans fats. ? Added sugars.  Avoid foods that have more than 300 milligrams (mg) of sodium per serving. Limit your daily sodium intake to less than 2,300 mg each day. Shopping  Avoid buying pre-made and processed foods. Cooking  Cook with olive oil. Do not use butter, lard, or ghee.  Bake, broil, grill, or boil foods. Avoid frying. Meal planning   Work with your dietitian to develop an eating plan that is right for you. This may include: ? Tracking how many calories you take in. Use a food diary, notebook, or mobile application to track what you eat at each meal. ? Using the glycemic index (GI) to plan your meals. The index tells you how quickly a food will raise your blood glucose. Choose low-GI foods. These foods take a longer time to raise blood glucose.  Consider following a Mediterranean diet. This diet includes: ? Several servings each day of fresh fruits and vegetables. ? Eating fish at least twice a week. ? Several servings each day of whole grains, beans, nuts, and seeds. ? Using olive oil instead of other fats. ? Moderate alcohol consumption. ? Eating small amounts of red meat and whole-fat dairy.  If you have high blood pressure, you may need to limit your sodium intake or follow a diet such as the DASH eating plan. DASH is an eating plan that aims to lower high blood pressure. What foods are recommended? The items listed below may not be a complete list. Talk with your dietitian about what dietary choices are best for you. Grains Whole grains, such as whole-wheat or whole-grain breads, crackers, cereals, and pasta. Unsweetened oatmeal. Bulgur. Barley. Quinoa. Brown rice. Corn or whole-wheat flour tortillas or taco shells. Vegetables Lettuce. Spinach. Peas. Beets. Cauliflower. Cabbage. Broccoli. Carrots. Tomatoes. Squash. Eggplant. Herbs. Peppers.  Onions. Cucumbers. Brussels sprouts. Fruits Berries. Bananas. Apples. Oranges. Grapes. Papaya. Mango. Pomegranate.  Kiwi. Grapefruit. Cherries. Meats and other protein foods Seafood. Poultry without skin. Lean cuts of pork and beef. Tofu. Eggs. Nuts. Beans. Dairy Low-fat or fat-free dairy products, such as yogurt, cottage cheese, and cheese. Beverages Water. Tea. Coffee. Sugar-free or diet soda. Seltzer water. Lowfat or no-fat milk. Milk alternatives, such as soy or almond milk. Fats and oils Olive oil. Canola oil. Sunflower oil. Grapeseed oil. Avocado. Walnuts. Sweets and desserts Sugar-free or low-fat pudding. Sugar-free or low-fat ice cream and other frozen treats. Seasoning and other foods Herbs. Sodium-free spices. Mustard. Relish. Low-fat, low-sugar ketchup. Low-fat, low-sugar barbecue sauce. Low-fat or fat-free mayonnaise. What foods are not recommended? The items listed below may not be a complete list. Talk with your dietitian about what dietary choices are best for you. Grains Refined white flour and flour products, such as bread, pasta, snack foods, and cereals. Vegetables Canned vegetables. Frozen vegetables with butter or cream sauce. Fruits Fruits canned with syrup. Meats and other protein foods Fatty cuts of meat. Poultry with skin. Breaded or fried meat. Processed meats. Dairy Full-fat yogurt, cheese, or milk. Beverages Sweetened drinks, such as sweet iced tea and soda. Fats and oils Butter. Lard. Ghee. Sweets and desserts Baked goods, such as cake, cupcakes, pastries, cookies, and cheesecake. Seasoning and other foods Spice mixes with added salt. Ketchup. Barbecue sauce. Mayonnaise. Summary  To prevent diabetes from developing, you may need to make diet and other lifestyle changes to help control blood sugar, improve cholesterol levels, and manage your blood pressure.  Set weight loss goals with the help of your health care team. It is recommended that most  people with prediabetes lose 7 percent of their current body weight.  Consider following a Mediterranean diet that includes plenty of fresh fruits and vegetables, whole grains, beans, nuts, seeds, fish, lean meat, low-fat dairy, and healthy oils. This information is not intended to replace advice given to you by your health care provider. Make sure you discuss any questions you have with your health care provider. Document Revised: 02/20/2019 Document Reviewed: 01/02/2017 Elsevier Patient Education  2020 [employer].

## 2020-06-17 ENCOUNTER — Telehealth: Payer: Self-pay | Admitting: Internal Medicine

## 2020-06-17 NOTE — Telephone Encounter (Signed)
Pt returned your call.  

## 2020-07-12 ENCOUNTER — Other Ambulatory Visit: Payer: Self-pay

## 2020-07-12 DIAGNOSIS — E291 Testicular hypofunction: Secondary | ICD-10-CM

## 2020-07-13 ENCOUNTER — Other Ambulatory Visit: Payer: Self-pay

## 2020-07-13 ENCOUNTER — Other Ambulatory Visit: Payer: Self-pay | Admitting: Cardiovascular Disease

## 2020-07-13 ENCOUNTER — Other Ambulatory Visit: Payer: Medicare Other

## 2020-07-13 DIAGNOSIS — E291 Testicular hypofunction: Secondary | ICD-10-CM | POA: Diagnosis not present

## 2020-07-14 ENCOUNTER — Telehealth: Payer: Self-pay | Admitting: *Deleted

## 2020-07-14 LAB — TESTOSTERONE: Testosterone: 852 ng/dL (ref 264–916)

## 2020-07-14 NOTE — Telephone Encounter (Signed)
-----   Message from Abbie Sons, MD sent at 07/14/2020  7:45 AM EDT ----- Testosterone level looks good at 852.  Please schedule office visit with me November 2021 with a testosterone, hematocrit and PSA prior

## 2020-07-14 NOTE — Telephone Encounter (Signed)
Notified patient as instructed, patient pleased. Discussed follow-up appointments, patient agrees  

## 2020-07-29 DIAGNOSIS — S61213A Laceration without foreign body of left middle finger without damage to nail, initial encounter: Secondary | ICD-10-CM | POA: Diagnosis not present

## 2020-09-14 ENCOUNTER — Other Ambulatory Visit: Payer: Self-pay

## 2020-09-14 ENCOUNTER — Other Ambulatory Visit: Payer: Medicare Other

## 2020-09-14 DIAGNOSIS — N5201 Erectile dysfunction due to arterial insufficiency: Secondary | ICD-10-CM

## 2020-09-14 DIAGNOSIS — E291 Testicular hypofunction: Secondary | ICD-10-CM

## 2020-09-14 DIAGNOSIS — Z0000 Encounter for general adult medical examination without abnormal findings: Secondary | ICD-10-CM

## 2020-09-14 DIAGNOSIS — E669 Obesity, unspecified: Secondary | ICD-10-CM

## 2020-09-14 DIAGNOSIS — R5383 Other fatigue: Secondary | ICD-10-CM

## 2020-09-14 DIAGNOSIS — Z125 Encounter for screening for malignant neoplasm of prostate: Secondary | ICD-10-CM | POA: Diagnosis not present

## 2020-09-15 LAB — PSA: Prostate Specific Ag, Serum: 1.7 ng/mL (ref 0.0–4.0)

## 2020-09-15 LAB — HEMATOCRIT: Hematocrit: 50.2 % (ref 37.5–51.0)

## 2020-09-15 LAB — TESTOSTERONE: Testosterone: 1035 ng/dL — ABNORMAL HIGH (ref 264–916)

## 2020-09-15 NOTE — Progress Notes (Signed)
09/16/2020 11:45 AM   Christian Cole January 24, 1951 [id number]  Referring provider: [name], Nino Glow, MD [place],  [place] [address] Chief Complaint  Patient presents with  . Hypogonadism    HPI: Christian Cole is a 69 y.o. male who returns for a 4 month follow up of hypogonadism.   -Penile implant at [facility] approximately 1 year ago, doing well -Testosterone 1,035, hematocrit 50.2 and PSA 1.7 on 09/14/2020.  -Patient has good energy and libido.  -Patient is doing well on 300 mg injections every 2 weeks. -No complaints.   PMH: Past Medical History:  Diagnosis Date  . CAD (coronary artery disease)    a. s/p CABG in 2000 with LIMA-LAD, RIMA-PDA, SVG-D1, and SVG-PLA b. low-risk NST in 2016  . Cancer (HCC)    skin cancer follows Dr. Kellie Moor   . ED (erectile dysfunction)   . GERD (gastroesophageal reflux disease)   . Hyperlipidemia   . Hypertension    Followed by Dr. Claiborne Billings  . OSA on CPAP   . Sinus bradycardia   . Thrombocytopenia [facility])     Surgical History: Past Surgical History:  Procedure Laterality Date  . CATARACT EXTRACTION, BILATERAL  2010  . COLONOSCOPY WITH PROPOFOL N/A 04/15/2020   Procedure: COLONOSCOPY WITH PROPOFOL;  Surgeon: Robert Bellow, MD;  Location: [facility] ENDOSCOPY;  Service: Endoscopy;  Laterality: N/A;  . CORONARY ARTERY BYPASS GRAFT  2000   LIMA to the LAD,RIMA to the PDA,vein graft to the diagonal & vein graft to the PLA  . ESOPHAGOGASTRODUODENOSCOPY     Dr. Gustavo Lah  . ESOPHAGOGASTRODUODENOSCOPY  04/15/2020   Procedure: ESOPHAGOGASTRODUODENOSCOPY (EGD);  Surgeon: Robert Bellow, MD;  Location: [facility] ENDOSCOPY;  Service: Endoscopy;;  . KNEE ARTHROSCOPY  1991  . KNEE DEBRIDEMENT  1989   Fluid flush  . SHOULDER ARTHROSCOPY  2011  . SHOULDER SURGERY  2010   bone spur   . US ECHOCARDIOGRAPHY  12/12/2010   mild LA dilatation, normal LV systolic fx, EF > 30%    Home Medications:  Allergies as of 09/16/2020   No Known  Allergies     Medication List       Accurate as of September 16, 2020 11:45 AM. If you have any questions, ask your nurse or doctor.        amLODipine 5 MG tablet Commonly known as: NORVASC Take 1 tablet (5 mg total) by mouth daily.   atorvastatin 40 MG tablet Commonly known as: LIPITOR TAKE 1 TABLET DAILY AT 6 P.M. (KEEP OFFICE VISIT)   B-D 3CC LUER-LOK SYR 21GX1-1/2 21G X 1-1/2" 3 ML Misc Generic drug: SYRINGE-NEEDLE (DISP) 3 ML Use this needle to injection   SYRINGE-NEEDLE (DISP) 3 ML 23G X 1-1/2" 3 ML Misc Use as directed   BD SafetyGlide Needle 18G X 1-1/2" Misc Generic drug: NEEDLE (DISP) 18 G Draw up medication with this needle   benazepril 40 MG tablet Commonly known as: LOTENSIN TAKE 1 TABLET DAILY (OFFICE VISIT NEEDED)   clopidogrel 75 MG tablet Commonly known as: PLAVIX TAKE 1 TABLET DAILY   ezetimibe 10 MG tablet Commonly known as: ZETIA Take 1 tablet (10 mg total) by mouth daily.   pantoprazole 40 MG tablet Commonly known as: PROTONIX take 1 tablet by mouth once daily 30 minutes BEFORE A MEAL   testosterone cypionate 200 MG/ML injection Commonly known as: DEPOTESTOSTERONE CYPIONATE Inject 1.5 mLs (300 mg total) into the muscle every 14 (fourteen) days.       Allergies:  No Known Allergies  Family History: Family History  Problem Relation Age of Onset  . Arthritis Mother   . Heart disease Father        CAD - MI  . Stroke Father   . Hypertension Father   . Cancer Father 36       esophageal cancer   . Arthritis Maternal Grandmother     Social History:  reports that he has been smoking cigarettes. He has a 22.00 pack-year smoking history. He has never used smokeless tobacco. He reports current alcohol use of about 10.0 standard drinks of alcohol per week. He reports that he does not use drugs.   Physical Exam: BP (!) 145/80   Pulse 80   Ht 5\' 11"  (1.803 m)   Wt 219 lb 3.2 oz (99.4 kg)   BMI 30.57 kg/m   Constitutional:  Alert and  oriented, No acute distress. HEENT: [place] AT, moist mucus membranes.  Trachea midline, no masses. Cardiovascular: No clubbing, cyanosis, or edema. Respiratory: Normal respiratory effort, no increased work of breathing. GI: Abdomen is soft, nontender, nondistended, no abdominal masses GU: No CVA tenderness. 40 g prostate, smooth no nodules/tenderness.  Lymph: No cervical or inguinal lymphadenopathy. Skin: No rashes, bruises or suspicious lesions. Neurologic: Grossly intact, no focal deficits, moving all 4 extremities. Psychiatric: Normal mood and affect.  Laboratory Data:  Lab Results  Component Value Date   CREATININE 0.93 06/15/2020    Lab Results  Component Value Date   TESTOSTERONE 1,035 (H) 09/14/2020    Lab Results  Component Value Date   HGBA1C 5.6 06/15/2020     Assessment & Plan:    1. Hypogonadism   Testosterone level slightly elevated as he injected 2 days prior to blood draw; PSA, hematocrit stable  Continue 300 mg every 2 weeks. Refills sent.   RTC in 6 months for testosterone, hematocrit and PSA only. Follow up in 1 year with PSA, testosterone, hematocrit and office visit.    [place] [address], [place], [place] [address] [phone]  I, Selena Batten, am acting as a scribe for Dr. Nicki Reaper C. Aloys Hupfer,  I have reviewed the above documentation for accuracy and completeness, and I agree with the above.   Abbie Sons, MD

## 2020-09-16 ENCOUNTER — Ambulatory Visit (INDEPENDENT_AMBULATORY_CARE_PROVIDER_SITE_OTHER): Payer: Medicare Other | Admitting: Urology

## 2020-09-16 ENCOUNTER — Encounter: Payer: Self-pay | Admitting: Urology

## 2020-09-16 ENCOUNTER — Other Ambulatory Visit: Payer: Self-pay

## 2020-09-16 VITALS — BP 145/80 | HR 80 | Ht 71.0 in | Wt 219.2 lb

## 2020-09-16 DIAGNOSIS — E291 Testicular hypofunction: Secondary | ICD-10-CM

## 2020-09-18 ENCOUNTER — Encounter: Payer: Self-pay | Admitting: Urology

## 2020-09-18 DIAGNOSIS — E291 Testicular hypofunction: Secondary | ICD-10-CM | POA: Insufficient documentation

## 2020-09-18 MED ORDER — TESTOSTERONE CYPIONATE 200 MG/ML IM SOLN: 300.0000 mg | INTRAMUSCULAR | 0 refills | Status: DC

## 2020-09-18 MED ORDER — "SYRINGE/NEEDLE (DISP) 23G X 1-1/2"" 3 ML MISC": 0 refills | Status: DC

## 2020-09-19 ENCOUNTER — Other Ambulatory Visit: Payer: Self-pay | Admitting: Cardiovascular Disease

## 2020-10-06 ENCOUNTER — Other Ambulatory Visit: Payer: Self-pay | Admitting: Student

## 2020-11-18 ENCOUNTER — Other Ambulatory Visit: Payer: Self-pay

## 2020-11-18 DIAGNOSIS — I10 Essential (primary) hypertension: Secondary | ICD-10-CM

## 2020-11-18 MED ORDER — AMLODIPINE BESYLATE 5 MG PO TABS: 5.0000 mg | ORAL_TABLET | Freq: Every day | ORAL | 1 refills | Status: DC

## 2020-12-21 ENCOUNTER — Encounter: Payer: Self-pay | Admitting: Internal Medicine

## 2020-12-21 ENCOUNTER — Other Ambulatory Visit: Payer: Self-pay

## 2020-12-21 ENCOUNTER — Ambulatory Visit (INDEPENDENT_AMBULATORY_CARE_PROVIDER_SITE_OTHER): Payer: Medicare Other | Admitting: Internal Medicine

## 2020-12-21 VITALS — BP 138/80 | HR 54 | Temp 97.9°F | Ht 71.0 in | Wt 219.8 lb

## 2020-12-21 DIAGNOSIS — I10 Essential (primary) hypertension: Secondary | ICD-10-CM | POA: Diagnosis not present

## 2020-12-21 DIAGNOSIS — Z1389 Encounter for screening for other disorder: Secondary | ICD-10-CM

## 2020-12-21 DIAGNOSIS — D696 Thrombocytopenia, unspecified: Secondary | ICD-10-CM

## 2020-12-21 DIAGNOSIS — Z1329 Encounter for screening for other suspected endocrine disorder: Secondary | ICD-10-CM | POA: Diagnosis not present

## 2020-12-21 DIAGNOSIS — K219 Gastro-esophageal reflux disease without esophagitis: Secondary | ICD-10-CM

## 2020-12-21 DIAGNOSIS — E559 Vitamin D deficiency, unspecified: Secondary | ICD-10-CM | POA: Diagnosis not present

## 2020-12-21 DIAGNOSIS — D751 Secondary polycythemia: Secondary | ICD-10-CM

## 2020-12-21 LAB — LIPID PANEL
Cholesterol: 139 mg/dL (ref 0–200)
HDL: 55 mg/dL (ref >39.00)
LDL Cholesterol: 51 mg/dL (ref 0–99)
NonHDL: 84.11
Total CHOL/HDL Ratio: 3
Triglycerides: 164 mg/dL — ABNORMAL HIGH (ref 0.0–149.0)
VLDL: 32.8 mg/dL (ref 0.0–40.0)

## 2020-12-21 LAB — COMPREHENSIVE METABOLIC PANEL
ALT: 22 U/L (ref 0–53)
AST: 22 U/L (ref 0–37)
Albumin: 4.6 g/dL (ref 3.5–5.2)
Alkaline Phosphatase: 71 U/L (ref 39–117)
BUN: 17 mg/dL (ref 6–23)
CO2: 25 mEq/L (ref 19–32)
Calcium: 9.6 mg/dL (ref 8.4–10.5)
Chloride: 103 mEq/L (ref 96–112)
Creatinine, Ser: 1.12 mg/dL (ref 0.40–1.50)
GFR: 66.39 mL/min (ref >60.00)
Glucose, Bld: 107 mg/dL — ABNORMAL HIGH (ref 70–99)
Potassium: 4.5 mEq/L (ref 3.5–5.1)
Sodium: 138 mEq/L (ref 135–145)
Total Bilirubin: 0.9 mg/dL (ref 0.2–1.2)
Total Protein: 6.9 g/dL (ref 6.0–8.3)

## 2020-12-21 LAB — VITAMIN D 25 HYDROXY (VIT D DEFICIENCY, FRACTURES): VITD: 51.06 ng/mL (ref 30.00–100.00)

## 2020-12-21 LAB — TSH: TSH: 1.6 u[IU]/mL (ref 0.35–4.50)

## 2020-12-21 MED ORDER — AMLODIPINE BESYLATE 2.5 MG PO TABS: 2.5000 mg | ORAL_TABLET | Freq: Every day | ORAL | 3 refills | Status: DC

## 2020-12-21 MED ORDER — PANTOPRAZOLE SODIUM 40 MG PO TBEC: DELAYED_RELEASE_TABLET | ORAL | 3 refills | Status: DC

## 2020-12-21 NOTE — Progress Notes (Signed)
Chief Complaint  Patient presents with  . Follow-up   F/u  1.HTN elevated on norvasc 5 mg qd and lotensin 40 mg qd and off lopressor 12.5 mg bid due to bradycardia declines diuretic due to urinating a lot  Otherwise doing well    Review of Systems  Constitutional: Negative for weight loss.  HENT: Negative for hearing loss.   Eyes: Negative for blurred vision.  Respiratory: Negative for shortness of breath.   Cardiovascular: Negative for chest pain.  Gastrointestinal: Negative for abdominal pain.  Musculoskeletal: Negative for falls and joint pain.  Skin: Negative for rash.  Neurological: Negative for headaches.  Psychiatric/Behavioral: Negative for memory loss.   Past Medical History:  Diagnosis Date  . CAD (coronary artery disease)    a. s/p CABG in 2000 with LIMA-LAD, RIMA-PDA, SVG-D1, and SVG-PLA b. low-risk NST in 2016  . Cancer (HCC)    skin cancer follows Dr. Kellie Moor   . ED (erectile dysfunction)   . GERD (gastroesophageal reflux disease)   . Hyperlipidemia   . Hypertension    Followed by Dr. Claiborne Billings  . OSA on CPAP   . Sinus bradycardia   . Thrombocytopenia ([place])    Past Surgical History:  Procedure Laterality Date  . CATARACT EXTRACTION, BILATERAL  2010  . COLONOSCOPY WITH PROPOFOL N/A 04/15/2020   Procedure: COLONOSCOPY WITH PROPOFOL;  Surgeon: Robert Bellow, MD;  Location: [facility] ENDOSCOPY;  Service: Endoscopy;  Laterality: N/A;  . CORONARY ARTERY BYPASS GRAFT  2000   LIMA to the LAD,RIMA to the PDA,vein graft to the diagonal & vein graft to the PLA  . ESOPHAGOGASTRODUODENOSCOPY     Dr. Gustavo Lah  . ESOPHAGOGASTRODUODENOSCOPY  04/15/2020   Procedure: ESOPHAGOGASTRODUODENOSCOPY (EGD);  Surgeon: Robert Bellow, MD;  Location: [facility] ENDOSCOPY;  Service: Endoscopy;;  . KNEE ARTHROSCOPY  1991  . KNEE DEBRIDEMENT  1989   Fluid flush  . SHOULDER ARTHROSCOPY  2011  . SHOULDER SURGERY  2010   bone spur   . US ECHOCARDIOGRAPHY  12/12/2010   mild LA dilatation,  normal LV systolic fx, EF > 88%   Family History  Problem Relation Age of Onset  . Arthritis Mother   . Heart disease Father        CAD - MI  . Stroke Father   . Hypertension Father   . Cancer Father 38       esophageal cancer   . Arthritis Maternal Grandmother    Social History   Socioeconomic History  . Marital status: Married    Spouse name: Not on file  . Number of children: 3  . Years of education: 23  . Highest education level: Not on file  Occupational History  . Occupation: Retired Corporate treasurer  . Occupation: Management/Technical Support    Comment: Psychiatric nurse  Tobacco Use  . Smoking status: Current Every Day Smoker    Packs/day: 0.50    Years: 44.00    Pack years: 22.00    Types: Cigarettes  . Smokeless tobacco: Never Used  . Tobacco comment: on and off smoker   Substance and Sexual Activity  . Alcohol use: Yes    Alcohol/week: 10.0 standard drinks    Types: 10 Cans of beer per week    Comment: Down to 3 beers a day from 4-6 a day   . Drug use: No  . Sexual activity: Yes    Partners: Female    Comment: Wife  Other Topics Concern  . Not on file  Social History  Narrative   Christian Cole grew up in [place]. Christian Cole is currently living in [place] with his wife. This is his second marriage. Christian Cole has 1 daughter and 2 sons from his first marriage. Christian Cole has 4 step kids. His daughter lives in [place], one son in [place], [place] and the other son in [place], [place]. 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    Christian Cole enjoys wood working on his spare time. 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 Christian Cole also enjoys shooting sports - target shooting and [employer] Camera operator).       Christian Cole retired 04/2019 and enjoys this       2 brothers and 2 sisters Christian Cole is next to youngest    Social Determinants of Radio broadcast assistant Strain: Not on file  Food Insecurity: Not on file  Transportation Needs: Not on file   Physical Activity: Not on file  Stress: Not on file  Social Connections: Not on file  Intimate Partner Violence: Not on file   Current Meds  Medication Sig  . amLODipine (NORVASC) 2.5 MG tablet Take 1 tablet (2.5 mg total) by mouth daily. +5 mg daily=7.5 mg daily total  . amLODipine (NORVASC) 5 MG tablet Take 1 tablet (5 mg total) by mouth daily.  Marland Kitchen atorvastatin (LIPITOR) 40 MG tablet TAKE 1 TABLET DAILY AT 6 P.M. (KEEP OFFICE VISIT)  . benazepril (LOTENSIN) 40 MG tablet TAKE 1 TABLET DAILY (OFFICE VISIT NEEDED)  . clopidogrel (PLAVIX) 75 MG tablet TAKE 1 TABLET DAILY  . ezetimibe (ZETIA) 10 MG tablet TAKE 1 TABLET DAILY  . NEEDLE, DISP, 18 G (BD SAFETYGLIDE NEEDLE) 18G X 1-1/2" MISC Draw up medication with this needle  . SYRINGE-NEEDLE, DISP, 3 ML 23G X 1-1/2" 3 ML MISC Use as directed  . testosterone cypionate (DEPOTESTOSTERONE CYPIONATE) 200 MG/ML injection Inject 1.5 mLs (300 mg total) into the muscle every 14 (fourteen) days.  . [DISCONTINUED] pantoprazole (PROTONIX) 40 MG tablet take 1 tablet by mouth once daily 30 minutes BEFORE A MEAL   No Known Allergies No results found for this or any previous visit (from the past 2160 hour(s)). Objective  Body mass index is 30.66 kg/m. Wt Readings from Last 3 Encounters:  12/21/20 219 lb 12.8 oz (99.7 kg)  09/16/20 219 lb 3.2 oz (99.4 kg)  06/15/20 221 lb 3.2 oz (100.3 kg)   Temp Readings from Last 3 Encounters:  12/21/20 97.9 F (36.6 C) (Oral)  06/15/20 98 F (36.7 C) (Oral)  04/15/20 (!) 97.1 F (36.2 C) (Temporal)   BP Readings from Last 3 Encounters:  12/21/20 138/80  09/16/20 (!) 145/80  06/15/20 140/80   Pulse Readings from Last 3 Encounters:  12/21/20 (!) 54  09/16/20 80  06/15/20 (!) 52    Physical Exam Vitals and nursing note reviewed.  Constitutional:      Appearance: Normal appearance. Christian Cole is well-developed and well-groomed. Christian Cole is obese.  HENT:     Head: Normocephalic and atraumatic.  Eyes:      Conjunctiva/sclera: Conjunctivae normal.     Pupils: Pupils are equal, round, and reactive to light.  Cardiovascular:     Rate and Rhythm: Normal rate and regular rhythm.     Heart sounds: Normal heart sounds. No murmur heard.   Pulmonary:     Effort: Pulmonary effort is normal.     Breath sounds: Normal breath sounds.  Abdominal:  Tenderness: There is no abdominal tenderness.  Skin:    General: Skin is warm and dry.  Neurological:     General: No focal deficit present.     Mental Status: Christian Cole is alert and oriented to person, place, and time. Mental status is at baseline.     Gait: Gait normal.  Psychiatric:        Attention and Perception: Attention and perception normal.        Mood and Affect: Mood and affect normal.        Speech: Speech normal.        Behavior: Behavior normal. Behavior is cooperative.        Thought Content: Thought content normal.        Cognition and Memory: Cognition and memory normal.        Judgment: Judgment normal.     Assessment  Plan  Hypertension, unspecified type - Plan: amLODipine (NORVASC) 2.5 MG tablet +5 mg qd=7.5 mg total and lotensin 40 mg qd, Comprehensive metabolic panel, Lipid panel, CBC with Differential/Platelet  Thrombocytopenia (HCC) - Plan: CBC with Differential/Platelet, Pathologist smear review  Gastroesophageal reflux disease - Plan: pantoprazole (PROTONIX) 40 MG tablet   HM Declinedflu shot Had prevnar, zostervax,utdpna 23 vaccine Disc shingrix for futureand Tdapgiven Rxgiven today 06/15/20 Disc hepA/B vaccineprevgiven Rxpreviouslywith fatty liver  Pfizer 2/2 consider booster   HadPSA 1.0 12/17/19  Results for Christian, Cole (MRN [id number]) as of 12/21/2020 10:19  Ref. Range 09/14/2020 09:13  Prostate Specific Ag, Serum Latest Ref Range: 0.0 - 4.0 ng/mL 1.7   Hep C neg Has mvt with vitamin D3  CT chesthad3/2021 rec smoking cessation    US abdomen1/29/19 neg normal spleen size, +fatty liver,  1.3 cm right kidney cyst  Colonoscopy had 02/16/14 tubular adenoma h[facility]o polyps [facility] [place] G[facility]GD/colonoscopy 04/15/20 repeat Q5 years Dr. Bary Castilla colonoscopy + hemorrhoids, and EGD with bx reflux esophagitis Q5 years  Sees derm Dr. Langston Reusing 2019SCC left arm x 2 f/ufor 2020-2021 bx bcc forearm left sch spring 2021right nose bx negative Q6 months to 1 year due spring 3 or 02/2021   Feeling great f/u [facility] Dr. Patrick North [phone] fax [phone] benefits CPAP f/u in 1 year  Local meds [facility] protonix, wellbutrin  chronic meds [facility]  Provider: Dr. [name] [name]-Internal Medicine

## 2020-12-21 NOTE — Patient Instructions (Addendum)
Goal blood pressure <130/<80  Let me in 2 weeks what you are getting please Total norvasc 7.5 mg daily for blood pressure       Thrombocytopenia Thrombocytopenia is a condition in which you have a low number of platelets in your blood. Platelets are also called thrombocytes. Platelets are tiny cells in the blood. When you bleed, they clump together at the cut or injury to stop the bleeding. This is called blood clotting. Not having enough platelets can cause bleeding problems. Some cases of thrombocytopenia are mild while others are more severe. What are the causes? This condition may be caused by:  Decreased production of platelets. This may be caused by: ? Aplastic anemia. This is when your bone marrow stops making blood cells. ? Cancer in the bone marrow. ? Certain medicines, including chemotherapy. ? Infection in the bone marrow. ? Drinking a lot of alcohol.  Increased destruction of platelets. This may be caused by: ? Certain immune diseases. ? Certain medicines. ? Certain blood clotting disorders. ? Certain inherited disorders. ? Certain bleeding disorders. ? Pregnancy. ? Having an enlarged spleen (hypersplenism). In hypersplenism, the spleen gathers up platelets from circulation. This means that the platelets are not available to help with blood clotting. The spleen can be enlarged because of cirrhosis or other conditions. What are the signs or symptoms? Symptoms of this condition are the result of poor blood clotting. They will vary depending on how low the platelet counts are. Symptoms may include:  Abnormal bleeding.  Nosebleeds.  Heavy menstrual periods.  Blood in the urine or stool (feces).  A purplish discoloration in the skin (purpura).  Bruising.  A rash that looks like pinpoint, purplish-red spots (petechiae) on the skin and mucous membranes. How is this diagnosed? This condition may be diagnosed with blood tests and a physical exam. Sometimes, a sample of  bone marrow may be removed to look for the original cells (megakaryocytes) that make platelets. Other tests may be needed depending on the cause.   How is this treated? Treatment for this condition depends on the cause. Treatment options may include:  Treatment of another condition that is causing the low platelet count.  Medicines to help protect your platelets from being destroyed.  A replacement (transfusion) of platelets to stop or prevent bleeding.  Surgery to remove the spleen. Follow these instructions at home: Activity  Avoid activities that could cause injury or bruising, and follow instructions about how to prevent falls.  Take extra care not to cut yourself when you shave or when you use scissors, needles, knives, and other tools.  Take extra care to protect yourself from burns when ironing or cooking. General instructions  Check your skin and the inside of your mouth for bruising or bleeding as told by your health care provider.  Check your spit (sputum), urine, and stool for blood as told by your health care provider.  Do not drink alcohol.  Take over-the-counter and prescription medicines only as told by your health care provider.  Do not take any medicines that have aspirin or NSAIDs in them. These medicines can thin your blood and cause you to bleed more easily.  Tell all your health care providers, including dentists and eye doctors, about your condition.   Contact a health care provider if you have:  Unexplained bruising. Get help right away if you have:  Active bleeding from anywhere on your body.  Blood in your sputum, urine, or stool. Summary  Thrombocytopenia is a condition in  which you have a low number of platelets in your blood.  Platelets are needed for blood clotting.  Symptoms of this condition are the result of poor blood clotting and may include abnormal bleeding, nosebleeds, and bruising.  This condition may be diagnosed with blood tests  and a physical exam.  Treatment for this condition depends on the cause. This information is not intended to replace advice given to you by your health care provider. Make sure you discuss any questions you have with your health care provider. Document Revised: 07/31/2018 Document Reviewed: 07/31/2018 Elsevier Patient Education  2021 [place] Eating Plan DASH stands for Dietary Approaches to Stop Hypertension. The DASH eating plan is a healthy eating plan that has been shown to:  Reduce high blood pressure (hypertension).  Reduce your risk for type 2 diabetes, heart disease, and stroke.  Help with weight loss. What are tips for following this plan? Reading food labels  Check food labels for the amount of salt (sodium) per serving. Choose foods with less than 5 percent of the Daily Value of sodium. Generally, foods with less than 300 milligrams (mg) of sodium per serving fit into this eating plan.  To find whole grains, look for the word "whole" as the first word in the ingredient list. Shopping  Buy products labeled as "low-sodium" or "no salt added."  Buy fresh foods. Avoid canned foods and pre-made or frozen meals. Cooking  Avoid adding salt when cooking. Use salt-free seasonings or herbs instead of table salt or sea salt. Check with your health care provider or pharmacist before using salt substitutes.  Do not fry foods. Cook foods using healthy methods such as baking, boiling, grilling, roasting, and broiling instead.  Cook with heart-healthy oils, such as olive, canola, avocado, soybean, or sunflower oil. Meal planning  Eat a balanced diet that includes: ? 4 or more servings of fruits and 4 or more servings of vegetables each day. Try to fill one-half of your plate with fruits and vegetables. ? 6-8 servings of whole grains each day. ? Less than 6 oz (170 g) of lean meat, poultry, or fish each day. A 3-oz (85-g) serving of meat is about the same size as a  deck of cards. One egg equals 1 oz (28 g). ? 2-3 servings of low-fat dairy each day. One serving is 1 cup (237 mL). ? 1 serving of nuts, seeds, or beans 5 times each week. ? 2-3 servings of heart-healthy fats. Healthy fats called omega-3 fatty acids are found in foods such as walnuts, flaxseeds, fortified milks, and eggs. These fats are also found in cold-water fish, such as sardines, salmon, and mackerel.  Limit how much you eat of: ? Canned or prepackaged foods. ? Food that is high in trans fat, such as some fried foods. ? Food that is high in saturated fat, such as fatty meat. ? Desserts and other sweets, sugary drinks, and other foods with added sugar. ? Full-fat dairy products.  Do not salt foods before eating.  Do not eat more than 4 egg yolks a week.  Try to eat at least 2 vegetarian meals a week.  Eat more home-cooked food and less restaurant, buffet, and fast food.   Lifestyle  When eating at a restaurant, ask that your food be prepared with less salt or no salt, if possible.  If you drink alcohol: ? Limit how much you use to:  0-1 drink a day for women who are not pregnant.  0-2 drinks a day for men. ? Be aware of how much alcohol is in your drink. In the [place] one drink equals one 12 oz bottle of beer (355 mL), one 5 oz glass of wine (148 mL), or one 1 oz glass of hard liquor (44 mL). General information  Avoid eating more than 2,300 mg of salt a day. If you have hypertension, you may need to reduce your sodium intake to 1,500 mg a day.  Work with your health care provider to maintain a healthy body weight or to lose weight. Ask what an ideal weight is for you.  Get at least 30 minutes of exercise that causes your heart to beat faster (aerobic exercise) most days of the week. Activities may include walking, swimming, or biking.  Work with your health care provider or dietitian to adjust your eating plan to your individual calorie needs. What foods should I  eat? Fruits All fresh, dried, or frozen fruit. Canned fruit in natural juice (without added sugar). Vegetables Fresh or frozen vegetables (raw, steamed, roasted, or grilled). Low-sodium or reduced-sodium tomato and vegetable juice. Low-sodium or reduced-sodium tomato sauce and tomato paste. Low-sodium or reduced-sodium canned vegetables. Grains Whole-grain or whole-wheat bread. Whole-grain or whole-wheat pasta. Brown rice. [place]. Bulgur. Whole-grain and low-sodium cereals. Pita bread. Low-fat, low-sodium crackers. Whole-wheat flour tortillas. Meats and other proteins Skinless chicken or [place]. Ground chicken or [place]. Pork with fat trimmed off. Fish and seafood. Egg whites. Dried beans, peas, or lentils. Unsalted nuts, nut butters, and seeds. Unsalted canned beans. Lean cuts of beef with fat trimmed off. Low-sodium, lean precooked or cured meat, such as sausages or meat loaves. Dairy Low-fat (1%) or fat-free (skim) milk. Reduced-fat, low-fat, or fat-free cheeses. Nonfat, low-sodium ricotta or cottage cheese. Low-fat or nonfat yogurt. Low-fat, low-sodium cheese. Fats and oils Soft margarine without trans fats. Vegetable oil. Reduced-fat, low-fat, or light mayonnaise and salad dressings (reduced-sodium). Canola, safflower, olive, avocado, soybean, and sunflower oils. Avocado. Seasonings and condiments Herbs. Spices. Seasoning mixes without salt. Other foods Unsalted popcorn and pretzels. Fat-free sweets. The items listed above may not be a complete list of foods and beverages you can eat. Contact a dietitian for more information. What foods should I avoid? Fruits Canned fruit in a light or heavy syrup. Fried fruit. Fruit in cream or butter sauce. Vegetables Creamed or fried vegetables. Vegetables in a cheese sauce. Regular canned vegetables (not low-sodium or reduced-sodium). Regular canned tomato sauce and paste (not low-sodium or reduced-sodium). Regular tomato and vegetable juice  (not low-sodium or reduced-sodium). Angie Fava. Olives. Grains Baked goods made with fat, such as croissants, muffins, or some breads. Dry pasta or rice meal packs. Meats and other proteins Fatty cuts of meat. Ribs. Fried meat. Berniece Salines. Bologna, salami, and other precooked or cured meats, such as sausages or meat loaves. Fat from the back of a pig (fatback). Bratwurst. Salted nuts and seeds. Canned beans with added salt. Canned or smoked fish. Whole eggs or egg yolks. Chicken or [place] with skin. Dairy Whole or 2% milk, cream, and half-and-half. Whole or full-fat cream cheese. Whole-fat or sweetened yogurt. Full-fat cheese. Nondairy creamers. Whipped toppings. Processed cheese and cheese spreads. Fats and oils Butter. Stick margarine. Lard. Shortening. Ghee. Bacon fat. Tropical oils, such as coconut, palm kernel, or palm oil. Seasonings and condiments Onion salt, garlic salt, seasoned salt, table salt, and sea salt. Worcestershire sauce. Tartar sauce. Barbecue sauce. Teriyaki sauce. Soy sauce, including reduced-sodium. Steak sauce. Canned and packaged gravies. Fish sauce. [employer]  sauce. Cocktail sauce. Store-bought horseradish. Ketchup. Mustard. Meat flavorings and tenderizers. Bouillon cubes. Hot sauces. Pre-made or packaged marinades. Pre-made or packaged taco seasonings. Relishes. Regular salad dressings. Other foods Salted popcorn and pretzels. The items listed above may not be a complete list of foods and beverages you should avoid. Contact a dietitian for more information. Where to find more information  National Heart, Lung, and Blood Institute: https://wilson-eaton.com/  American Heart Association: www.heart.org  Academy of Nutrition and Dietetics: www.eatright.[place]: www.kidney.org Summary  The DASH eating plan is a healthy eating plan that has been shown to reduce high blood pressure (hypertension). It may also reduce your risk for type 2 diabetes, heart disease, and  stroke.  When on the DASH eating plan, aim to eat more fresh fruits and vegetables, whole grains, lean proteins, low-fat dairy, and heart-healthy fats.  With the DASH eating plan, you should limit salt (sodium) intake to 2,300 mg a day. If you have hypertension, you may need to reduce your sodium intake to 1,500 mg a day.  Work with your health care provider or dietitian to adjust your eating plan to your individual calorie needs. This information is not intended to replace advice given to you by your health care provider. Make sure you discuss any questions you have with your health care provider. Document Revised: 10/02/2019 Document Reviewed: 10/02/2019 Elsevier Patient Education  2021 [employer].

## 2020-12-23 LAB — CBC WITH DIFFERENTIAL/PLATELET
Basophils Absolute: 0 10*3/uL (ref 0.0–0.2)
Basos: 0 %
EOS (ABSOLUTE): 0.1 10*3/uL (ref 0.0–0.4)
Eos: 1 %
Hematocrit: 54.9 % — ABNORMAL HIGH (ref 37.5–51.0)
Hemoglobin: 18.4 g/dL — ABNORMAL HIGH (ref 13.0–17.7)
Immature Grans (Abs): 0 10*3/uL (ref 0.0–0.1)
Immature Granulocytes: 1 %
Lymphocytes Absolute: 3.1 10*3/uL (ref 0.7–3.1)
Lymphs: 39 %
MCH: 31.9 pg (ref 26.6–33.0)
MCHC: 33.5 g/dL (ref 31.5–35.7)
MCV: 95 fL (ref 79–97)
Monocytes Absolute: 0.9 10*3/uL (ref 0.1–0.9)
Monocytes: 12 %
Neutrophils Absolute: 3.8 10*3/uL (ref 1.4–7.0)
Neutrophils: 47 %
Platelets: 141 10*3/uL — ABNORMAL LOW (ref 150–450)
RBC: 5.76 x10E6/uL (ref 4.14–5.80)
RDW: 13.6 % (ref 11.6–15.4)
WBC: 8 10*3/uL (ref 3.4–10.8)

## 2020-12-23 LAB — URINALYSIS, ROUTINE W REFLEX MICROSCOPIC
Bilirubin, UA: NEGATIVE
Glucose, UA: NEGATIVE
Ketones, UA: NEGATIVE
Leukocytes,UA: NEGATIVE
Nitrite, UA: NEGATIVE
Protein,UA: NEGATIVE
RBC, UA: NEGATIVE
Specific Gravity, UA: 1.017 (ref 1.005–1.030)
Urobilinogen, Ur: 0.2 mg/dL (ref 0.2–1.0)
pH, UA: 6.5 (ref 5.0–7.5)

## 2020-12-26 ENCOUNTER — Telehealth: Payer: Self-pay | Admitting: *Deleted

## 2020-12-26 LAB — PATHOLOGIST SMEAR REVIEW
Basophils Absolute: 0 10*3/uL (ref 0.0–0.2)
Basos: 1 %
EOS (ABSOLUTE): 0.1 10*3/uL (ref 0.0–0.4)
Eos: 1 %
Hematocrit: 53.4 % — ABNORMAL HIGH (ref 37.5–51.0)
Hemoglobin: 18.3 g/dL — ABNORMAL HIGH (ref 13.0–17.7)
Immature Grans (Abs): 0 10*3/uL (ref 0.0–0.1)
Immature Granulocytes: 0 %
Lymphocytes Absolute: 3.4 10*3/uL — ABNORMAL HIGH (ref 0.7–3.1)
Lymphs: 40 %
MCH: 32.5 pg (ref 26.6–33.0)
MCHC: 34.3 g/dL (ref 31.5–35.7)
MCV: 95 fL (ref 79–97)
Monocytes Absolute: 0.8 10*3/uL (ref 0.1–0.9)
Monocytes: 10 %
Neutrophils Absolute: 4 10*3/uL (ref 1.4–7.0)
Neutrophils: 48 %
Platelets: 139 10*3/uL — ABNORMAL LOW (ref 150–450)
RBC: 5.63 x10E6/uL (ref 4.14–5.80)
RDW: 13.5 % (ref 11.6–15.4)
WBC: 8.4 10*3/uL (ref 3.4–10.8)

## 2020-12-26 NOTE — Telephone Encounter (Signed)
Attempted to contact and schedule lung screening scan. Message left for patient to call back to schedule. 

## 2020-12-28 ENCOUNTER — Encounter: Payer: Self-pay | Admitting: Internal Medicine

## 2020-12-28 DIAGNOSIS — D751 Secondary polycythemia: Secondary | ICD-10-CM | POA: Insufficient documentation

## 2020-12-28 NOTE — Addendum Note (Signed)
Addended by: Orland Mustard on: 12/28/2020 11:57 AM   Modules accepted: Orders

## 2020-12-28 NOTE — Telephone Encounter (Signed)
-----   Message from Delorise Jackson, MD sent at 12/27/2020  4:34 PM EST ----- Per urology Dr. Bernardo Heater I would recommend a hematology evaluation for consideration of phlebotomy. Will not be able to continue testosterone replacement with a RBC level in this range. I will be happy to place the referral if you would like    does he want referral hematology?

## 2020-12-30 ENCOUNTER — Other Ambulatory Visit: Payer: Self-pay | Admitting: *Deleted

## 2020-12-30 DIAGNOSIS — F17200 Nicotine dependence, unspecified, uncomplicated: Secondary | ICD-10-CM

## 2020-12-30 DIAGNOSIS — Z122 Encounter for screening for malignant neoplasm of respiratory organs: Secondary | ICD-10-CM

## 2020-12-30 DIAGNOSIS — Z87891 Personal history of nicotine dependence: Secondary | ICD-10-CM

## 2020-12-30 NOTE — Progress Notes (Signed)
Contacted and scheduled for annual lung screening scan. Patient is a current smoker with a 46 pack year history.

## 2021-01-03 ENCOUNTER — Encounter: Payer: Self-pay | Admitting: Oncology

## 2021-01-03 ENCOUNTER — Inpatient Hospital Stay: Payer: Medicare Other | Attending: Oncology | Admitting: Oncology

## 2021-01-03 ENCOUNTER — Inpatient Hospital Stay: Payer: Medicare Other

## 2021-01-03 VITALS — BP 139/75 | HR 55 | Temp 98.9°F | Resp 18 | Wt 223.8 lb

## 2021-01-03 DIAGNOSIS — D751 Secondary polycythemia: Secondary | ICD-10-CM

## 2021-01-03 DIAGNOSIS — D696 Thrombocytopenia, unspecified: Secondary | ICD-10-CM | POA: Diagnosis not present

## 2021-01-03 DIAGNOSIS — F17210 Nicotine dependence, cigarettes, uncomplicated: Secondary | ICD-10-CM | POA: Insufficient documentation

## 2021-01-03 DIAGNOSIS — Z79890 Hormone replacement therapy (postmenopausal): Secondary | ICD-10-CM | POA: Diagnosis not present

## 2021-01-03 DIAGNOSIS — Z801 Family history of malignant neoplasm of trachea, bronchus and lung: Secondary | ICD-10-CM | POA: Diagnosis not present

## 2021-01-03 DIAGNOSIS — Z79899 Other long term (current) drug therapy: Secondary | ICD-10-CM | POA: Diagnosis not present

## 2021-01-03 DIAGNOSIS — I10 Essential (primary) hypertension: Secondary | ICD-10-CM | POA: Insufficient documentation

## 2021-01-03 DIAGNOSIS — Z720 Tobacco use: Secondary | ICD-10-CM

## 2021-01-03 DIAGNOSIS — Z8249 Family history of ischemic heart disease and other diseases of the circulatory system: Secondary | ICD-10-CM | POA: Insufficient documentation

## 2021-01-03 LAB — CBC WITH DIFFERENTIAL/PLATELET
Abs Immature Granulocytes: 0.04 10*3/uL (ref 0.00–0.07)
Basophils Absolute: 0 10*3/uL (ref 0.0–0.1)
Basophils Relative: 0 %
Eosinophils Absolute: 0.1 10*3/uL (ref 0.0–0.5)
Eosinophils Relative: 1 %
HCT: 52 % (ref 39.0–52.0)
Hemoglobin: 17.3 g/dL — ABNORMAL HIGH (ref 13.0–17.0)
Immature Granulocytes: 1 %
Lymphocytes Relative: 41 %
Lymphs Abs: 2.8 10*3/uL (ref 0.7–4.0)
MCH: 31.9 pg (ref 26.0–34.0)
MCHC: 33.3 g/dL (ref 30.0–36.0)
MCV: 95.8 fL (ref 80.0–100.0)
Monocytes Absolute: 0.8 10*3/uL (ref 0.1–1.0)
Monocytes Relative: 11 %
Neutro Abs: 3.1 10*3/uL (ref 1.7–7.7)
Neutrophils Relative %: 46 %
Platelets: 123 10*3/uL — ABNORMAL LOW (ref 150–400)
RBC: 5.43 MIL/uL (ref 4.22–5.81)
RDW: 14.5 % (ref 11.5–15.5)
WBC: 6.8 10*3/uL (ref 4.0–10.5)
nRBC: 0 % (ref 0.0–0.2)

## 2021-01-03 LAB — VITAMIN B12: Vitamin B-12: 434 pg/mL (ref 180–914)

## 2021-01-03 LAB — LACTATE DEHYDROGENASE: LDH: 138 U/L (ref 98–192)

## 2021-01-03 LAB — FOLATE: Folate: 36 ng/mL (ref >5.9)

## 2021-01-03 NOTE — Progress Notes (Signed)
Patient here to establish care for thrombocytopenia, polycythemia.

## 2021-01-03 NOTE — Progress Notes (Signed)
Hematology/Oncology Consult note [facility] [phone] [phone]   Patient Care Team: [name], Nino Glow, MD as PCP - General (Internal Medicine) Troy Sine, MD as PCP - Cardiology (Cardiology)  REFERRING PROVIDER: [name], [name] *  CHIEF COMPLAINTS/REASON FOR VISIT:  Evaluation of thrombocytopenia and erythrocytosis  HISTORY OF PRESENTING ILLNESS:  Christian Cole is a 70 y.o. male who was seen in consultation at the request of [name], [name] * for evaluation of thrombocytopenia and erythrocytosis.    Reviewed patient's labs done previously.  12/21/2020 labs showed decreased platelet counts at 1 39,000. wbc 8.4 hemoglobin 18.3. Reviewed patient's previous labs. Thrombocytopenia is chronic chronic onset , since at least 2015. No aggravating or elevated factors. Erythrocytosis, started after patient was started on testosterone replacement therapy. He previously never had erythrocytosis sent to them. Associated symptoms or signs:  Denies weight loss, fever, chills, fatigue, night sweats.  Denies hematochezia, hematuria, hematemesis, epistaxis, black tarry stool.  easy bruising.   Patient drinks 3 beers a day.  Sleep apnea, patient reports being compliant with CPAP machine . Review of Systems  Constitutional: Negative for appetite change, chills, fatigue, fever and unexpected weight change.  HENT:   Negative for hearing loss and voice change.   Eyes: Negative for eye problems and icterus.  Respiratory: Negative for chest tightness, cough and shortness of breath.   Cardiovascular: Negative for chest pain and leg swelling.  Gastrointestinal: Negative for abdominal distention and abdominal pain.  Endocrine: Negative for hot flashes.  Genitourinary: Negative for difficulty urinating, dysuria and frequency.   Musculoskeletal: Negative for arthralgias.  Skin: Negative for itching and rash.  Neurological:  Negative for light-headedness and numbness.  Hematological: Negative for adenopathy. Does not bruise/bleed easily.  Psychiatric/Behavioral: Negative for confusion.    MEDICAL HISTORY:  Past Medical History:  Diagnosis Date  . CAD (coronary artery disease)    a. s/p CABG in 2000 with LIMA-LAD, RIMA-PDA, SVG-D1, and SVG-PLA b. low-risk NST in 2016  . Cancer (HCC)    skin cancer follows Dr. Kellie Moor   . ED (erectile dysfunction)   . GERD (gastroesophageal reflux disease)   . Hyperlipidemia   . Hypertension    Followed by Dr. Claiborne Billings  . OSA on CPAP   . Sinus bradycardia   . Thrombocytopenia ([place])     SURGICAL HISTORY: Past Surgical History:  Procedure Laterality Date  . CATARACT EXTRACTION, BILATERAL  2010  . COLONOSCOPY WITH PROPOFOL N/A 04/15/2020   Procedure: COLONOSCOPY WITH PROPOFOL;  Surgeon: Robert Bellow, MD;  Location: [facility] ENDOSCOPY;  Service: Endoscopy;  Laterality: N/A;  . CORONARY ARTERY BYPASS GRAFT  2000   LIMA to the LAD,RIMA to the PDA,vein graft to the diagonal & vein graft to the PLA  . ESOPHAGOGASTRODUODENOSCOPY     Dr. Gustavo Lah  . ESOPHAGOGASTRODUODENOSCOPY  04/15/2020   Procedure: ESOPHAGOGASTRODUODENOSCOPY (EGD);  Surgeon: Robert Bellow, MD;  Location: [facility] ENDOSCOPY;  Service: Endoscopy;;  . KNEE ARTHROSCOPY  1991  . KNEE DEBRIDEMENT  1989   Fluid flush  . SHOULDER ARTHROSCOPY  2011  . SHOULDER SURGERY  2010   bone spur   . US ECHOCARDIOGRAPHY  12/12/2010   mild LA dilatation, normal LV systolic fx, EF > 96%    SOCIAL HISTORY: Social History   Socioeconomic History  . Marital status: Married    Spouse name: Not on file  . Number of children: 3  . Years of education: 26  . Highest education level: Not on file  Occupational  History  . Occupation: Retired Corporate treasurer  . Occupation: Management/Technical Support    Comment: Psychiatric nurse  Tobacco Use  . Smoking status: Current Every Day Smoker    Packs/day: 0.50    Years: 44.00    Pack  years: 22.00    Types: Cigarettes  . Smokeless tobacco: Never Used  . Tobacco comment: on and off smoker   Vaping Use  . Vaping Use: Never used  Substance and Sexual Activity  . Alcohol use: Yes    Alcohol/week: 10.0 standard drinks    Types: 10 Cans of beer per week    Comment: Down to 3 beers a day from 4-6 a day   . Drug use: No  . Sexual activity: Yes    Partners: Female    Comment: Wife  Other Topics Concern  . Not on file  Social History Narrative   Akbar grew up in [place]. He is currently living in [place] with his wife. This is his second marriage. He has 1 daughter and 2 sons from his first marriage. He has 4 step kids. His daughter lives in [place], one son in [place], [place] and the other son in [place], [place]. 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    He enjoys wood working on his spare time. 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       He retired 04/2019 and enjoys this       2 brothers and 2 sisters he is next to youngest    Social Determinants of Radio broadcast assistant Strain: Not on file  Food Insecurity: Not on file  Transportation Needs: Not on file  Physical Activity: Not on file  Stress: Not on file  Social Connections: Not on file  Intimate Partner Violence: Not on file    FAMILY HISTORY: Family History  Problem Relation Age of Onset  . Arthritis Mother   . Heart disease Father        CAD - MI  . Stroke Father   . Hypertension Father   . Cancer Father 46       esophageal cancer   . Arthritis Maternal Grandmother     ALLERGIES:  has No Known Allergies.  MEDICATIONS:  Current Outpatient Medications  Medication Sig Dispense Refill  . amLODipine (NORVASC) 2.5 MG tablet Take 1 tablet (2.5 mg total) by mouth daily. +5 mg daily=7.5 mg daily total 90 tablet 3  . amLODipine  (NORVASC) 5 MG tablet Take 1 tablet (5 mg total) by mouth daily. 90 tablet 1  . atorvastatin (LIPITOR) 40 MG tablet TAKE 1 TABLET DAILY AT 6 P.M. (KEEP OFFICE VISIT) 90 tablet 3  . benazepril (LOTENSIN) 40 MG tablet TAKE 1 TABLET DAILY (OFFICE VISIT NEEDED) 90 tablet 3  . clopidogrel (PLAVIX) 75 MG tablet TAKE 1 TABLET DAILY 90 tablet 2  . ezetimibe (ZETIA) 10 MG tablet TAKE 1 TABLET DAILY 90 tablet 3  . NEEDLE, DISP, 18 G (BD SAFETYGLIDE NEEDLE) 18G X 1-1/2" MISC Draw up medication with this needle 30 each 0  . pantoprazole (PROTONIX) 40 MG tablet take 1 tablet by mouth once daily 30 minutes BEFORE A MEAL 90 tablet 3  . SYRINGE-NEEDLE, DISP, 3 ML 23G X 1-1/2" 3 ML MISC Use as directed 30 each 0  . testosterone cypionate (DEPOTESTOSTERONE CYPIONATE)  200 MG/ML injection Inject 1.5 mLs (300 mg total) into the muscle every 14 (fourteen) days. 10 mL 0   No current facility-administered medications for this visit.     PHYSICAL EXAMINATION: ECOG PERFORMANCE STATUS: 0 - Asymptomatic Vitals:   01/03/21 1054  BP: 139/75  Pulse: (!) 55  Resp: 18  Temp: 98.9 F (37.2 C)   Filed Weights   01/03/21 1054  Weight: 223 lb 12.8 oz (101.5 kg)    Physical Exam Constitutional:      General: He is not in acute distress. HENT:     Head: Normocephalic and atraumatic.  Eyes:     General: No scleral icterus. Cardiovascular:     Rate and Rhythm: Normal rate and regular rhythm.     Heart sounds: Normal heart sounds.  Pulmonary:     Effort: Pulmonary effort is normal. No respiratory distress.     Breath sounds: No wheezing.  Abdominal:     General: Bowel sounds are normal. There is no distension.     Palpations: Abdomen is soft.  Musculoskeletal:        General: No deformity. Normal range of motion.     Cervical back: Normal range of motion and neck supple.  Skin:    General: Skin is warm and dry.     Findings: No erythema or rash.  Neurological:     Mental Status: He is alert and oriented to  person, place, and time. Mental status is at baseline.     Cranial Nerves: No cranial nerve deficit.     Coordination: Coordination normal.  Psychiatric:        Mood and Affect: Mood normal.      LABORATORY DATA:  I have reviewed the data as listed Lab Results  Component Value Date   WBC 6.8 01/03/2021   HGB 17.3 (H) 01/03/2021   HCT 52.0 01/03/2021   MCV 95.8 01/03/2021   PLT 123 (L) 01/03/2021   Recent Labs    06/15/20 1122 12/21/20 1042  NA 138 138  K 4.1 4.5  CL 105 103  CO2 26 25  GLUCOSE 99 107*  BUN 13 17  CREATININE 0.93 1.12  CALCIUM 9.3 9.6  PROT 6.7 6.9  ALBUMIN 4.5 4.6  AST 21 22  ALT 21 22  ALKPHOS 71 71  BILITOT 0.8 0.9   Iron/TIBC/Ferritin/ %Sat No results found for: IRON, TIBC, FERRITIN, IRONPCTSAT    RADIOGRAPHIC STUDIES: I have personally reviewed the radiological images as listed and agreed with the findings in the report.  No results found.   ASSESSMENT & PLAN:  1. Thrombocytopenia ([employer])   2. Erythrocytosis   3. Long-term current use of testosterone replacement therapy   4. Tobacco use    #Thrombocytopenia For the work up of patient's thrombocytopenia, I recommend checking CBC;CMP, LDH;  folate, Vitamin B12,   flowcytometry and monoclonal gammopathy workup.  Hepatitis B surface antigen tested negative in 2019.   #Erythrocytosis, likely secondary due to testosterone replacement therapy. Smoking may also contribute to that.  Smoking cessation was discussed with patient.  Recommend phlebotomy to keep hematocrit less than 50.  Proceed with phlebotomy 500 cc x 1.  # Patient follow-up with me in approximately 2 weeks to review the above results.   Orders Placed This Encounter  Procedures  . Folate    Standing Status:   Future    Number of Occurrences:   1    Standing Expiration Date:   01/03/2022  . Vitamin B12  Standing Status:   Future    Number of Occurrences:   1    Standing Expiration Date:   01/03/2022  . CBC with  Differential/Platelet    Standing Status:   Future    Number of Occurrences:   1    Standing Expiration Date:   01/03/2022  . Lactate dehydrogenase    Standing Status:   Future    Number of Occurrences:   1    Standing Expiration Date:   01/03/2022  . Flow cytometry panel-leukemia/lymphoma work-up    Standing Status:   Future    Number of Occurrences:   1    Standing Expiration Date:   01/03/2022  . Kappa/lambda light chains    Standing Status:   Future    Number of Occurrences:   1    Standing Expiration Date:   01/03/2022  . Multiple Myeloma Panel (SPEP&IFE w/QIG)    Standing Status:   Future    Number of Occurrences:   1    Standing Expiration Date:   01/03/2022    All questions were answered. The patient knows to call the clinic with any problems questions or concerns.  Cc [name], [name] *  Return of visit: 2 weeks.  Thank you for this kind referral and the opportunity to participate in the care of this patient. A copy of today's note is routed to referring provider    Earlie Server, MD, PhD 01/03/2021

## 2021-01-04 ENCOUNTER — Other Ambulatory Visit: Payer: Self-pay

## 2021-01-04 ENCOUNTER — Telehealth: Payer: Self-pay

## 2021-01-04 ENCOUNTER — Inpatient Hospital Stay: Payer: Medicare Other

## 2021-01-04 VITALS — BP 123/73 | HR 56 | Temp 97.2°F | Resp 19

## 2021-01-04 DIAGNOSIS — D751 Secondary polycythemia: Secondary | ICD-10-CM

## 2021-01-04 DIAGNOSIS — I10 Essential (primary) hypertension: Secondary | ICD-10-CM | POA: Diagnosis not present

## 2021-01-04 DIAGNOSIS — D696 Thrombocytopenia, unspecified: Secondary | ICD-10-CM | POA: Diagnosis not present

## 2021-01-04 DIAGNOSIS — Z801 Family history of malignant neoplasm of trachea, bronchus and lung: Secondary | ICD-10-CM | POA: Diagnosis not present

## 2021-01-04 DIAGNOSIS — Z79899 Other long term (current) drug therapy: Secondary | ICD-10-CM | POA: Diagnosis not present

## 2021-01-04 DIAGNOSIS — F17210 Nicotine dependence, cigarettes, uncomplicated: Secondary | ICD-10-CM | POA: Diagnosis not present

## 2021-01-04 LAB — KAPPA/LAMBDA LIGHT CHAINS
Kappa free light chain: 16.3 mg/L (ref 3.3–19.4)
Kappa, lambda light chain ratio: 1.2 (ref 0.26–1.65)
Lambda free light chains: 13.6 mg/L (ref 5.7–26.3)

## 2021-01-04 NOTE — Telephone Encounter (Signed)
Patient notified via phone .

## 2021-01-04 NOTE — Telephone Encounter (Signed)
Patient notified via [place].  Please cancel phlebotomy appt on 3/4

## 2021-01-04 NOTE — Patient Instructions (Signed)

## 2021-01-04 NOTE — Addendum Note (Signed)
Addended by: Earlie Server on: 01/04/2021 12:52 PM   Modules accepted: Orders

## 2021-01-04 NOTE — Telephone Encounter (Signed)
Done..  Phlebotomy appt sched on 01/13/21 has been cx as requested

## 2021-01-04 NOTE — Progress Notes (Signed)
Pt tolerated his 1st phlebotomy. Removed 500 ml blood.VSS. No complaints at time of discharge.

## 2021-01-04 NOTE — Telephone Encounter (Signed)
-----   Message from Earlie Server, MD sent at 01/03/2021  7:37 PM EST ----- Please cancel his phlebotomy on 3/4.  Keep phlebotomy session for this week.  Let him know that hemoglobin is 17.3, better than his last blood work on 12/21/2020 when hemoglobin was 18.3.

## 2021-01-05 ENCOUNTER — Encounter: Payer: Self-pay | Admitting: Urology

## 2021-01-05 LAB — MULTIPLE MYELOMA PANEL, SERUM
Albumin SerPl Elph-Mcnc: 4.1 g/dL (ref 2.9–4.4)
Albumin/Glob SerPl: 1.6 (ref 0.7–1.7)
Alpha 1: 0.2 g/dL (ref 0.0–0.4)
Alpha2 Glob SerPl Elph-Mcnc: 0.8 g/dL (ref 0.4–1.0)
B-Globulin SerPl Elph-Mcnc: 0.9 g/dL (ref 0.7–1.3)
Gamma Glob SerPl Elph-Mcnc: 0.6 g/dL (ref 0.4–1.8)
Globulin, Total: 2.6 g/dL (ref 2.2–3.9)
IgA: 172 mg/dL (ref 61–437)
IgG (Immunoglobin G), Serum: 586 mg/dL — ABNORMAL LOW (ref 603–1613)
IgM (Immunoglobulin M), Srm: 85 mg/dL (ref 20–172)
Total Protein ELP: 6.7 g/dL (ref 6.0–8.5)

## 2021-01-05 LAB — COMP PANEL: LEUKEMIA/LYMPHOMA

## 2021-01-06 ENCOUNTER — Other Ambulatory Visit: Payer: Self-pay | Admitting: Urology

## 2021-01-06 MED ORDER — "SYRINGE/NEEDLE (DISP) 23G X 1-1/2"" 3 ML MISC": 0 refills | Status: DC

## 2021-01-06 MED ORDER — TESTOSTERONE CYPIONATE 200 MG/ML IM SOLN: 300.0000 mg | INTRAMUSCULAR | 0 refills | Status: DC

## 2021-01-06 MED ORDER — "BD SAFETYGLIDE NEEDLE 18G X 1-1/2"" MISC": 0 refills | Status: DC

## 2021-01-09 ENCOUNTER — Encounter: Payer: Self-pay | Admitting: Cardiovascular Disease

## 2021-01-09 ENCOUNTER — Other Ambulatory Visit: Payer: Self-pay

## 2021-01-09 ENCOUNTER — Ambulatory Visit (INDEPENDENT_AMBULATORY_CARE_PROVIDER_SITE_OTHER): Payer: Medicare Other | Admitting: Cardiovascular Disease

## 2021-01-09 DIAGNOSIS — Z9989 Dependence on other enabling machines and devices: Secondary | ICD-10-CM

## 2021-01-09 DIAGNOSIS — Z720 Tobacco use: Secondary | ICD-10-CM

## 2021-01-09 DIAGNOSIS — Z951 Presence of aortocoronary bypass graft: Secondary | ICD-10-CM

## 2021-01-09 DIAGNOSIS — I2510 Atherosclerotic heart disease of native coronary artery without angina pectoris: Secondary | ICD-10-CM | POA: Diagnosis not present

## 2021-01-09 DIAGNOSIS — N529 Male erectile dysfunction, unspecified: Secondary | ICD-10-CM

## 2021-01-09 DIAGNOSIS — I440 Atrioventricular block, first degree: Secondary | ICD-10-CM | POA: Diagnosis not present

## 2021-01-09 DIAGNOSIS — I10 Essential (primary) hypertension: Secondary | ICD-10-CM | POA: Diagnosis not present

## 2021-01-09 DIAGNOSIS — G4733 Obstructive sleep apnea (adult) (pediatric): Secondary | ICD-10-CM

## 2021-01-09 NOTE — Progress Notes (Signed)
Patient ID: Christian Cole, male   DOB: 05/21/1951, 70 y.o.   MRN: [id number]     HPI: Christian Cole is a 70 y.o. male the presents for a 12 month cardiology evaluation.  Mr Christian Cole has known CAD and underwent initial percutaneous coronary intervention in 1998 of an unknown vessel. He underwent CABG revascularization surgery in December 2000 with LIMA to LAD, LIMA to the PDA, vein to the diagonal, and then to the PLA. 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  An echo Doppler study in January 2002 showed normal systolic function. His mild TR and mild MR. A nuclear perfusion study in March 2013 showed normal perfusion without scar or ischemia.  Since I last saw him one year ago he has continued to do well.  He remains active.  He denies any symptoms of chest pain.  He he had seen Laura Ingold in March 2016 and she had reduced his metoprolol to 12.5 mg twice a day due to asymptomatic bradycardia.  He had blood work in December 2015 by his primary care physician and his total cholesterol was 166, HDL 32, LDL 104, VLDL 29 and triglycerides 149.  He has been maintained on atorvastatin 40 mg, Plavix 75 mg, pantoprazole 40 mg, as well as Viagra as needed.  He underwent a nuclear perfusion study on April 26, 2015.  He remained asymptomatic.  He developed asymptomatic ST segment depression during stress.  Scintigraphic images were entirely normal.  He had normal function.  Ejection fraction was 52%.  He presents for evaluation.  When I  saw him in October 2017 he was doing well and was without chest pain.  He was exercising daily.  He had lost over 40 pounds in the 8 months prior to that evaluation.  His peak weight was 242 and his weight this morning without clothes at home was 195.  He continues to use CPAP with 100% compliance.   Since I last saw him, he was  evaluated by Brittany Strader in October 2018.  His blood pressure was elevated and amlodipine 5 mg was added to his benazepril and Lopressor.  He had resumed smoking and at that time was given a prescription for Chantix.  I saw him in December 2019 and prior to that evaluation he had  received a new CPAP machine which was prescribed by physician in [place].  He was sleeping well and cannot sleep without his CPAP.  Unfortunately he was still smoking 1/2 pack of cigarettes per day.  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  He walks at least 2 miles.  He denies any chest pain PND orthopnea.   He underwent carotid Dopplers in June 2020 which did not reveal any significant internal carotid stenoses in the 1 to 39% range.  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  A Myoview study in July 2020 showed EF 55 to 60% without scar or ischemia.  He was seen by Hao Meng, PA for preoperative clearance and underwent successful inflatable penile prosthesis by Dr. Finkler at [facility].  He tolerated surgery without cardiovascular compromise.  I last saw him in February 2021 at which time he denied any anginal symptomatology.  Unfortunately he was still smoking.  He continued to be compliant with CPAP therapy and "cannot sleep without it."  Since I last saw him, he   unfortunately is still smoking.  He denies any chest pain or shortness of breath.  He has been working around the house remodeling his bathroom.  He has been on amlodipine 7.5 mg, benazepril 40 mg for hypertension, atorvastatin 40 mg and Zetia for hyperlipidemia, and is on testosterone injections every 14 days with continued low testosterone levels.  He continues to use CPAP with 100% compliance.  He presents for evaluation.   Past Medical History:  Diagnosis Date  . CAD (coronary artery disease)    a. s/p CABG in 2000 with LIMA-LAD, RIMA-PDA, SVG-D1, and SVG-PLA b. low-risk NST in  2016  . Cancer (HCC)    skin cancer follows Dr. Isenstein   . ED (erectile dysfunction)   . GERD (gastroesophageal reflux disease)   . Hyperlipidemia   . Hypertension    Followed by Dr. Kelly  . OSA on CPAP   . Sinus bradycardia   . Thrombocytopenia (HCC)     Past Surgical History:  Procedure Laterality Date  . CATARACT EXTRACTION, BILATERAL  2010  . COLONOSCOPY WITH PROPOFOL N/A 04/15/2020   Procedure: COLONOSCOPY WITH PROPOFOL;  Surgeon: Byrnett, Jeffrey W, MD;  Location: [facility];  Service: Endoscopy;  Laterality: N/A;  . CORONARY ARTERY BYPASS GRAFT  2000   LIMA to the LAD,RIMA to the PDA,vein graft to the diagonal & vein graft to the PLA  . ESOPHAGOGASTRODUODENOSCOPY     Dr. Skulskie  . ESOPHAGOGASTRODUODENOSCOPY  04/15/2020   Procedure: ESOPHAGOGASTRODUODENOSCOPY (EGD);  Surgeon: Byrnett, Jeffrey W, MD;  Location: [facility] END[facility]COPY;  Service: Endoscopy;;  . KNEE ARTHROSCOPY  1991  . KNEE DEBRIDEMENT  1989   Fluid flush  . SHOULDER ARTHROSCOPY  2011  . SHOULDER SURGERY  2010   bone spur   . US ECHOCARDIOGRAPHY  12/12/2010   mild LA dilatation, normal LV systolic fx, EF > 55%    No Known Allergies  Current Outpatient Medications  Medication Sig Dispense Refill  . amLODipine (NORVASC) 2.5 MG tablet Take 1 tablet (2.5 mg total) by mouth daily. +5 mg daily=7.5 mg daily total 90 tablet 3  . amLODipine (NORVASC) 5 MG tablet Take 1 tablet (5 mg total) by mouth daily. 90 tablet 1  . atorvastatin (LIPITOR) 40 MG tablet TAKE 1 TABLET DAILY AT 6 P.M. (KEEP OFFICE VISIT) 90 tablet 3  . benazepril (LOTENSIN) 40 MG tablet TAKE 1 TABLET DAILY (OFFICE VISIT NEEDED) 90 tablet 3  . clopidogrel (PLAVIX) 75 MG tablet TAKE 1 TABLET DAILY 90 tablet 2  . ezetimibe (ZETIA) 10 MG tablet TAKE 1 TABLET DAILY 90 tablet 3  . NEEDLE, DISP, 18 G (BD SAFETYGLIDE NEEDLE) 18G X 1-1/2" MISC Draw up medication with this needle 30 each 0  . pantoprazole (PROTONIX) 40 MG tablet take 1 tablet by mouth once  daily 30 minutes BEFORE A MEAL 90 tablet 3  . SYRINGE-NEEDLE, DISP, 3 ML 23G X 1-1/2" 3 ML MISC Use as directed 30 each 0  . testosterone cypionate (DEPOTESTOSTERONE CYPIONATE) 200 MG/ML injection Inject 1.5 mLs (300 mg total) into the muscle every 14 (fourteen) days. 10 mL 0   No current facility-administered medications for this visit.    Social History   Socioeconomic History  . Marital status: Married    Spouse name: Not on file  . Number of children: 3  . Years of education: 16  . Highest education level: Not on file  Occupational History  . Occupation: Retired [employer]  . Occupation: Management/Technical Support    Comment: [employer]    Tobacco Use  . Smoking status: Current Every Day Smoker    Packs/day: 0.50    Years: 44.00    Pack years: 22.00    Types: Cigarettes  . Smokeless tobacco: Never Used  . Tobacco comment: on and off smoker   Vaping Use  . Vaping Use: Never used  Substance and Sexual Activity  . Alcohol use: Yes    Alcohol/week: 10.0 standard drinks    Types: 10 Cans of beer per week    Comment: Down to 3 beers a day from 4-6 a day   . Drug use: No  . Sexual activity: Yes    Partners: Female    Comment: Wife  Other Topics Concern  . Not on file  Social History Narrative   Elihu grew up in [place]. He is currently living in [place] with his wife. This is his second marriage. He has 1 daughter and 2 sons from his first marriage. He has 4 step kids. His daughter lives in [place], one son in [place], [place] and the other son in [place], [place]. 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    He enjoys wood working on his spare time. 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       He retired 04/2019 and enjoys this       2 brothers and 2 sisters he is next to youngest    Social Determinants of  Radio broadcast assistant Strain: Not on file  Food Insecurity: Not on file  Transportation Needs: Not on file  Physical Activity: Not on file  Stress: Not on file  Social Connections: Not on file  Intimate Partner Violence: Not on file    Family History  Problem Relation Age of Onset  . Arthritis Mother   . Heart disease Father        CAD - MI  . Stroke Father   . Hypertension Father   . Cancer Father 67       esophageal cancer   . Arthritis Maternal Grandmother     ROS General: Negative; No fevers, chills, or night sweats;  HEENT: Negative; No changes in vision or hearing, sinus congestion, difficulty swallowing Pulmonary: Negative; No cough, wheezing, shortness of breath, hemoptysis Cardiovascular: See history of present illness GI: Negative; No nausea, vomiting, diarrhea, or abdominal pain GU: Low testosterone no dysuria, hematuria, or difficulty voiding Musculoskeletal: Negative; no myalgias, joint pain, or weakness Hematologic/Oncology: Negative; no easy bruising, bleeding Endocrine: Negative; no heat/cold intolerance; no diabetes Neuro: Negative; no changes in balance, headaches Skin: Negative; No rashes or skin lesions Psychiatric: Negative; No behavioral problems, depression Sleep: Positive for OSA on CPAP therapy.  No snoring, daytime sleepiness, hypersomnolence, bruxism, restless legs, hypnogognic hallucinations, no cataplexy Other comprehensive 14 point system review is negative.  PE BP 128/68 (BP Location: Left Arm, Patient Position: Sitting)   Pulse (!) 57   Ht 5' 10" (1.778 m)   Wt 220 lb (99.8 kg)   SpO2 96%   BMI 31.57 kg/m    Repeat blood pressure by me was 140/74.  He states his blood pressure this morning was 122/70  Wt Readings from Last 3 Encounters:  01/11/21 220 lb (99.8 kg)  01/09/21 220 lb (99.8 kg)  01/03/21 223 lb 12.8 oz (101.5 kg)   General: Alert, oriented,  no distress.  Skin: normal turgor, no rashes, warm and dry HEENT:  Normocephalic, atraumatic. Pupils equal round and reactive to light; sclera anicteric; extraocular muscles intact;  Nose without nasal septal hypertrophy Mouth/Parynx benign; Mallinpatti scale 3 Neck: No JVD, no carotid bruits; normal carotid upstroke Lungs: clear to ausculatation and percussion; no wheezing or rales Chest wall: without tenderness to palpitation Heart: PMI not displaced, RRR, s1 s2 normal, 1/6 systolic murmur, no diastolic murmur, no rubs, gallops, thrills, or heaves Abdomen: soft, nontender; no hepatosplenomehaly, BS+; abdominal aorta nontender and not dilated by palpation. Back: no CVA tenderness Pulses 2+ Musculoskeletal: full range of motion, normal strength, no joint deformities Extremities: no clubbing cyanosis or edema, Homan's sign negative  Neurologic: grossly nonfocal; Cranial nerves grossly wnl Psychologic: Normal mood and affect   ECG (independently read by me): Sinus bradycardia at 53; 1st degree block, PR 222 msec, borderline LVH  February 2021 ECG (independently read by me): Sinus bradycardia at 58 bpm, first-degree AV block with a PR interval at 244 ms.  No significant ST-T changes.  QTc interval 431 ms  December 2019 ECG (independently read by me): Sinus bradycardia at 56 bpm.  First-degree block with PR interval 264 ms.  QTc interval 443 ms  October 2017 ECG (independently read by me): Sinus bradycardia at 44 bpm with first degree AV block.  Q wave in leads III and F.  PR interval 224 ms.  June 2016 ECG (independently read by me): not done today due to recent treadmill nuclear study  ECG (independently read by me): Sinus bradycardia at 45 beats per minute. Normal intervals. Q waves in lead 3 and small Q wave in aVF.  LABS: BMP Latest Ref Rng & Units 12/21/2020 06/15/2020 12/17/2019  Glucose 70 - 99 mg/dL 107(H) 99 97  BUN 6 - 23 mg/dL 17 13 14  Creatinine 0.40 - 1.50 mg/dL 1.12 0.93 1.13  BUN/Creat Ratio 10 - 24 - - 12  Sodium 135 - 145 mEq/L 138 138 139   Potassium 3.5 - 5.1 mEq/L 4.5 4.1 5.0  Chloride 96 - 112 mEq/L 103 105 102  CO2 19 - 32 mEq/L 25 26 19(L)  Calcium 8.4 - 10.5 mg/dL 9.6 9.3 9.6   Hepatic Function Latest Ref Rng & Units 12/21/2020 06/15/2020 12/17/2019  Total Protein 6.0 - 8.3 g/dL 6.9 6.7 7.5  Albumin 3.5 - 5.2 g/dL 4.6 4.5 5.0(H)  AST 0 - 37 U/L 22 21 24  ALT 0 - 53 U/L 22 21 24  Alk Phosphatase 39 - 117 U/L 71 71 104  Total Bilirubin 0.2 - 1.2 mg/dL 0.9 0.8 0.6   CBC Latest Ref Rng & Units 01/03/2021 12/21/2020 12/21/2020  WBC 4.0 - 10.5 K/uL 6.8 8.4 8.0  Hemoglobin 13.0 - 17.0 g/dL 17.3(H) 18.3(H) 18.4(H)  Hematocrit 39.0 - 52.0 % 52.0 53.4(H) 54.9(H)  Platelets 150 - 400 K/uL 123(L) 139(L) 141(L)   Lab Results  Component Value Date   MCV 95.8 01/03/2021   MCV 95 12/21/2020   MCV 95 12/21/2020   Lab Results  Component Value Date   TSH 1.60 12/21/2020   Lab Results  Component Value Date   HGBA1C 5.6 06/15/2020    Lipid Panel     Component Value Date/Time   CHOL 139 12/21/2020 1042   CHOL 167 12/17/2019 1113   TRIG 164.0 (H) 12/21/2020 1042   HDL 55.00 12/21/2020 1042   HDL 86 12/17/2019 1113   CHOLHDL 3 12/21/2020 1042   VLDL 32.8 12/21/2020 1042     LDLCALC 51 12/21/2020 1042   LDLCALC 65 12/17/2019 1113   LDLDIRECT 91.6 01/06/2014 1443    RADIOLOGY: No results found.  IMPRESSION: 1. Essential hypertension   2. CAD in native artery   3. Hx of CABG   4. OSA on CPAP   5. Tobacco use   6. Erectile dysfunction, unspecified erectile dysfunction type: Status post penile implant   7. First degree AV block     ASSESSMENT AND PLAN: Mr. Payden Docter is a 70 year-old gentleman with history of CAD who underwent initial intervention in 1998 and due to progressive CAD underwent CABG surgery in 2000. A nuclear perfusion study in 2013 remained normal without scar or ischemia.  A three-year follow-up nuclear perfusion study on 04/26/2015 continued to show entirely normal perfusion without scar or ischemia.   However, he developed asymptomatic ST depression during stress.  For this reason, it was interpreted as intermediate study.  Post-rest ejection fraction was 52%.  A repeat Myoview in July 2020 EF 55 to 60% was without ischemia or scar.  He underwent successful penile implant surgery without cardiovascular compromise.  ECG is stable today and shows sinus bradycardia with first-degree AV block.  His blood pressure today is stable although was mildly increased when rechecked by me.  However he states that his blood pressure at home was 122/70.  He continues to be on amlodipine 7.5 mg, benazepril 40 mg daily.  He is on combination therapy with Zetia and atorvastatin 40 mg for hyperlipidemia.  I reviewed laboratory from February 2022 which showed LDL cholesterol 51.  Triglycerides are mildly increased at 164.  Hemoglobin A1c in August 2021 was 5.6.  We discussed the importance of smoking cessation.  He  continues to be compliant with CPAP and "cannot sleep without it."  Will be following up with his primary physician Dr. [name] [name] and as long as he is stable, I will see him in 1 year for follow-up evaluation.   Troy Sine, MD, [facility]  01/16/2021 4:18 PM

## 2021-01-09 NOTE — Patient Instructions (Signed)

## 2021-01-11 ENCOUNTER — Other Ambulatory Visit: Payer: Self-pay

## 2021-01-11 ENCOUNTER — Ambulatory Visit
Admission: RE | Admit: 2021-01-11 | Discharge: 2021-01-11 | Disposition: A | Discharge status: Home / Self Care | Payer: Medicare Other | Source: Ambulatory Visit | Attending: Oncology | Admitting: Oncology

## 2021-01-11 DIAGNOSIS — Z87891 Personal history of nicotine dependence: Secondary | ICD-10-CM | POA: Diagnosis not present

## 2021-01-11 DIAGNOSIS — F17200 Nicotine dependence, unspecified, uncomplicated: Secondary | ICD-10-CM | POA: Diagnosis not present

## 2021-01-11 DIAGNOSIS — Z122 Encounter for screening for malignant neoplasm of respiratory organs: Secondary | ICD-10-CM | POA: Diagnosis not present

## 2021-01-16 ENCOUNTER — Encounter: Payer: Self-pay | Admitting: Cardiovascular Disease

## 2021-01-17 ENCOUNTER — Other Ambulatory Visit: Payer: Self-pay

## 2021-01-17 DIAGNOSIS — D751 Secondary polycythemia: Secondary | ICD-10-CM

## 2021-01-17 DIAGNOSIS — D696 Thrombocytopenia, unspecified: Secondary | ICD-10-CM

## 2021-01-18 ENCOUNTER — Inpatient Hospital Stay (HOSPITAL_BASED_OUTPATIENT_CLINIC_OR_DEPARTMENT_OTHER): Payer: Medicare Other | Admitting: Oncology

## 2021-01-18 ENCOUNTER — Encounter: Payer: Self-pay | Admitting: Oncology

## 2021-01-18 ENCOUNTER — Inpatient Hospital Stay: Payer: Medicare Other | Attending: Oncology

## 2021-01-18 ENCOUNTER — Inpatient Hospital Stay: Payer: Medicare Other

## 2021-01-18 VITALS — BP 146/79 | HR 55 | Temp 97.7°F | Resp 18 | Wt 220.0 lb

## 2021-01-18 VITALS — BP 137/71 | HR 53 | Temp 97.0°F | Resp 18

## 2021-01-18 DIAGNOSIS — D696 Thrombocytopenia, unspecified: Secondary | ICD-10-CM | POA: Diagnosis not present

## 2021-01-18 DIAGNOSIS — D751 Secondary polycythemia: Secondary | ICD-10-CM

## 2021-01-18 DIAGNOSIS — Z9989 Dependence on other enabling machines and devices: Secondary | ICD-10-CM | POA: Insufficient documentation

## 2021-01-18 DIAGNOSIS — Z951 Presence of aortocoronary bypass graft: Secondary | ICD-10-CM | POA: Diagnosis not present

## 2021-01-18 DIAGNOSIS — Z8249 Family history of ischemic heart disease and other diseases of the circulatory system: Secondary | ICD-10-CM | POA: Diagnosis not present

## 2021-01-18 DIAGNOSIS — I10 Essential (primary) hypertension: Secondary | ICD-10-CM | POA: Insufficient documentation

## 2021-01-18 DIAGNOSIS — E785 Hyperlipidemia, unspecified: Secondary | ICD-10-CM | POA: Diagnosis not present

## 2021-01-18 DIAGNOSIS — G4733 Obstructive sleep apnea (adult) (pediatric): Secondary | ICD-10-CM | POA: Diagnosis not present

## 2021-01-18 DIAGNOSIS — Z79899 Other long term (current) drug therapy: Secondary | ICD-10-CM | POA: Insufficient documentation

## 2021-01-18 DIAGNOSIS — Z85828 Personal history of other malignant neoplasm of skin: Secondary | ICD-10-CM | POA: Insufficient documentation

## 2021-01-18 DIAGNOSIS — K219 Gastro-esophageal reflux disease without esophagitis: Secondary | ICD-10-CM | POA: Insufficient documentation

## 2021-01-18 LAB — HEMOGLOBIN AND HEMATOCRIT, BLOOD
HCT: 51.7 % (ref 39.0–52.0)
Hemoglobin: 17.2 g/dL — ABNORMAL HIGH (ref 13.0–17.0)

## 2021-01-18 NOTE — Progress Notes (Signed)
Pt here for follow up. No new concerns voiced.   

## 2021-01-18 NOTE — Progress Notes (Signed)
Patient tolerated 500 ml therapeutic phlebotomy well. No complaints at time of discharge. Pt ambulatory.

## 2021-01-18 NOTE — Progress Notes (Signed)
Hematology/Oncology Consult note [facility] Telephone:([phone] [phone]   Patient Care Team: [name], Nino Glow, MD as PCP - General (Internal Medicine) Troy Sine, MD as PCP - Cardiology (Cardiology)  REFERRING PROVIDER: [name], [name] *  CHIEF COMPLAINTS/REASON FOR VISIT:  Evaluation of thrombocytopenia and erythrocytosis  HISTORY OF PRESENTING ILLNESS:  Christian Cole is a 70 y.o. male who was seen in consultation at the request of [name], [name] * for evaluation of thrombocytopenia and erythrocytosis.    Reviewed patient's labs done previously.  12/21/2020 labs showed decreased platelet counts at 1 39,000. wbc 8.4 hemoglobin 18.3. Reviewed patient's previous labs. Thrombocytopenia is chronic chronic onset , since at least 2015. No aggravating or elevated factors. Erythrocytosis, started after patient was started on testosterone replacement therapy. He previously never had erythrocytosis sent to them. Associated symptoms or signs:  Denies weight loss, fever, chills, fatigue, night sweats.  Denies hematochezia, hematuria, hematemesis, epistaxis, black tarry stool.  easy bruising.   Patient drinks 3 beers a day.  Sleep apnea, patient reports being compliant with CPAP machine  INTERVAL HISTORY Christian Cole is a 70 y.o. male who has above history reviewed by me today presents for follow up visit for management of erythrocytosis Problems and complaints are listed below: Patient has underwent phlebotomy and he tolerated well. . Review of Systems  Constitutional: Negative for appetite change, chills, fatigue, fever and unexpected weight change.  HENT:   Negative for hearing loss and voice change.   Eyes: Negative for eye problems and icterus.  Respiratory: Negative for chest tightness, cough and shortness of breath.   Cardiovascular: Negative for chest pain and leg swelling.  Gastrointestinal: Negative for  abdominal distention and abdominal pain.  Endocrine: Negative for hot flashes.  Genitourinary: Negative for difficulty urinating, dysuria and frequency.   Musculoskeletal: Negative for arthralgias.  Skin: Negative for itching and rash.  Neurological: Negative for light-headedness and numbness.  Hematological: Negative for adenopathy. Does not bruise/bleed easily.  Psychiatric/Behavioral: Negative for confusion.    MEDICAL HISTORY:  Past Medical History:  Diagnosis Date  . CAD (coronary artery disease)    a. s/p CABG in 2000 with LIMA-LAD, RIMA-PDA, SVG-D1, and SVG-PLA b. low-risk NST in 2016  . Cancer (HCC)    skin cancer follows Dr. Kellie Moor   . ED (erectile dysfunction)   . GERD (gastroesophageal reflux disease)   . Hyperlipidemia   . Hypertension    Followed by Dr. Claiborne Billings  . OSA on CPAP   . Sinus bradycardia   . Thrombocytopenia ([place])     SURGICAL HISTORY: Past Surgical History:  Procedure Laterality Date  . CATARACT EXTRACTION, BILATERAL  2010  . COLONOSCOPY WITH PROPOFOL N/A 04/15/2020   Procedure: COLONOSCOPY WITH PROPOFOL;  Surgeon: Robert Bellow, MD;  Location: [facility] ENDOSCOPY;  Service: Endoscopy;  Laterality: N/A;  . CORONARY ARTERY BYPASS GRAFT  2000   LIMA to the LAD,RIMA to the PDA,vein graft to the diagonal & vein graft to the PLA  . ESOPHAGOGASTRODUODENOSCOPY     Dr. Gustavo Lah  . ESOPHAGOGASTRODUODENOSCOPY  04/15/2020   Procedure: ESOPHAGOGASTRODUODENOSCOPY (EGD);  Surgeon: Robert Bellow, MD;  Location: [facility] ENDOSCOPY;  Service: Endoscopy;;  . KNEE ARTHROSCOPY  1991  . KNEE DEBRIDEMENT  1989   Fluid flush  . SHOULDER ARTHROSCOPY  2011  . SHOULDER SURGERY  2010   bone spur   . US ECHOCARDIOGRAPHY  12/12/2010   mild LA dilatation, normal LV systolic fx, EF > 33%    SOCIAL  HISTORY: Social History   Socioeconomic History  . Marital status: Married    Spouse name: Not on file  . Number of children: 3  . Years of education: 52  . Highest education  level: Not on file  Occupational History  . Occupation: Retired Corporate treasurer  . Occupation: Management/Technical Support    Comment: Psychiatric nurse  Tobacco Use  . Smoking status: Current Every Day Smoker    Packs/day: 0.50    Years: 44.00    Pack years: 22.00    Types: Cigarettes  . Smokeless tobacco: Never Used  . Tobacco comment: on and off smoker   Vaping Use  . Vaping Use: Never used  Substance and Sexual Activity  . Alcohol use: Yes    Alcohol/week: 10.0 standard drinks    Types: 10 Cans of beer per week    Comment: Down to 3 beers a day from 4-6 a day   . Drug use: No  . Sexual activity: Yes    Partners: Female    Comment: Wife  Other Topics Concern  . Not on file  Social History Narrative   Josh grew up in [place]. He is currently living in [place] with his wife. This is his second marriage. He has 1 daughter and 2 sons from his first marriage. He has 4 step kids. His daughter lives in [place], one son in [place], [place] and the other son in [place], [place]. 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    He enjoys wood working on his spare time. 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       He retired 04/2019 and enjoys this       2 brothers and 2 sisters he is next to youngest    Social Determinants of Radio broadcast assistant Strain: Not on file  Food Insecurity: Not on file  Transportation Needs: Not on file  Physical Activity: Not on file  Stress: Not on file  Social Connections: Not on file  Intimate Partner Violence: Not on file    FAMILY HISTORY: Family History  Problem Relation Age of Onset  . Arthritis Mother   . Heart disease Father        CAD - MI  . Stroke Father   . Hypertension Father   . Cancer Father 46       esophageal cancer   . Arthritis Maternal Grandmother     ALLERGIES:   has No Known Allergies.  MEDICATIONS:  Current Outpatient Medications  Medication Sig Dispense Refill  . amLODipine (NORVASC) 2.5 MG tablet Take 1 tablet (2.5 mg total) by mouth daily. +5 mg daily=7.5 mg daily total 90 tablet 3  . amLODipine (NORVASC) 5 MG tablet Take 1 tablet (5 mg total) by mouth daily. 90 tablet 1  . atorvastatin (LIPITOR) 40 MG tablet TAKE 1 TABLET DAILY AT 6 P.M. (KEEP OFFICE VISIT) 90 tablet 3  . benazepril (LOTENSIN) 40 MG tablet TAKE 1 TABLET DAILY (OFFICE VISIT NEEDED) 90 tablet 3  . clopidogrel (PLAVIX) 75 MG tablet TAKE 1 TABLET DAILY 90 tablet 2  . ezetimibe (ZETIA) 10 MG tablet TAKE 1 TABLET DAILY 90 tablet 3  . NEEDLE, DISP, 18 G (BD SAFETYGLIDE NEEDLE) 18G X 1-1/2" MISC Draw up medication with this needle 30 each 0  . pantoprazole (PROTONIX)  40 MG tablet take 1 tablet by mouth once daily 30 minutes BEFORE A MEAL 90 tablet 3  . SYRINGE-NEEDLE, DISP, 3 ML 23G X 1-1/2" 3 ML MISC Use as directed 30 each 0  . testosterone cypionate (DEPOTESTOSTERONE CYPIONATE) 200 MG/ML injection Inject 1.5 mLs (300 mg total) into the muscle every 14 (fourteen) days. 10 mL 0   No current facility-administered medications for this visit.     PHYSICAL EXAMINATION: ECOG PERFORMANCE STATUS: 0 - Asymptomatic Vitals:   01/18/21 1411  BP: (!) 146/79  Pulse: (!) 55  Resp: 18  Temp: 97.7 F (36.5 C)   Filed Weights   01/18/21 1411  Weight: 220 lb (99.8 kg)    Physical Exam Constitutional:      General: He is not in acute distress. HENT:     Head: Normocephalic and atraumatic.  Eyes:     General: No scleral icterus. Cardiovascular:     Rate and Rhythm: Normal rate and regular rhythm.     Heart sounds: Normal heart sounds.  Pulmonary:     Effort: Pulmonary effort is normal. No respiratory distress.     Breath sounds: No wheezing.  Abdominal:     General: Bowel sounds are normal. There is no distension.     Palpations: Abdomen is soft.  Musculoskeletal:         General: No deformity. Normal range of motion.     Cervical back: Normal range of motion and neck supple.  Skin:    General: Skin is warm and dry.     Findings: No erythema or rash.  Neurological:     Mental Status: He is alert and oriented to person, place, and time. Mental status is at baseline.     Cranial Nerves: No cranial nerve deficit.     Coordination: Coordination normal.  Psychiatric:        Mood and Affect: Mood normal.      LABORATORY DATA:  I have reviewed the data as listed Lab Results  Component Value Date   WBC 6.8 01/03/2021   HGB 17.2 (H) 01/18/2021   HCT 51.7 01/18/2021   MCV 95.8 01/03/2021   PLT 123 (L) 01/03/2021   Recent Labs    06/15/20 1122 12/21/20 1042  NA 138 138  K 4.1 4.5  CL 105 103  CO2 26 25  GLUCOSE 99 107*  BUN 13 17  CREATININE 0.93 1.12  CALCIUM 9.3 9.6  PROT 6.7 6.9  ALBUMIN 4.5 4.6  AST 21 22  ALT 21 22  ALKPHOS 71 71  BILITOT 0.8 0.9   Iron/TIBC/Ferritin/ %Sat No results found for: IRON, TIBC, FERRITIN, IRONPCTSAT    RADIOGRAPHIC STUDIES: I have personally reviewed the radiological images as listed and agreed with the findings in the report.  CT CHEST LUNG CANCER SCREENING LOW DOSE WO CONTRAST  Result Date: 01/12/2021 CLINICAL DATA:  70 year old male with 46 pack-year history of smoking. Lung cancer screening. EXAM: CT CHEST WITHOUT CONTRAST LOW-DOSE FOR LUNG CANCER SCREENING TECHNIQUE: Multidetector CT imaging of the chest was performed following the standard protocol without IV contrast. COMPARISON:  01/12/2020. FINDINGS: Cardiovascular: The heart size is normal. No substantial pericardial effusion. Coronary artery calcification is evident. Atherosclerotic calcification is noted in the wall of the thoracic aorta. Ascending thoracic aorta measures 4.3 cm diameter. Mediastinum/Nodes: No mediastinal lymphadenopathy. No evidence for gross hilar lymphadenopathy although assessment is limited by the lack of intravenous contrast on  today's study. The esophagus has normal imaging features. There is no axillary  lymphadenopathy. Lungs/Pleura: Centrilobular and paraseptal emphysema evident. Previously identified tiny pulmonary nodules are stable. No new suspicious nodule or mass. No focal airspace consolidation. No pleural effusion. Upper Abdomen: Unremarkable. Musculoskeletal: No worrisome lytic or sclerotic osseous abnormality. IMPRESSION: 1. Lung-RADS 2, benign appearance or behavior. Continue annual screening with low-dose chest CT without contrast in 12 months. 2. Stable 4.3 cm ascending thoracic aortic diameter. Recommend attention on follow-up annual screening chest CT. Aortic aneurysm NOS (ICD10-I71.9) 3.  Emphysema (ICD10-J43.9) and Aortic Atherosclerosis (ICD10-170.0) Electronically Signed   By: Misty Stanley M.D.   On: 01/12/2021 14:14     ASSESSMENT & PLAN:  1. Thrombocytopenia ([place])   2. Erythrocytosis    #Thrombocytopenia Work-up showed normal folate and vitamin B12 level, negative flowcytometry and monoclonal gammopathy workup.  Hepatitis B surface antigen tested negative in 2019.  Hep C negative Continue monitor.  #Erythrocytosis, likely secondary due to testosterone replacement therapy. He tolerated therapeutic phlebotomy. Labs reviewed and discussed with patient.  H&H showed hemoglobin of 17.2, hematocrit 51.7. Proceed with another phlebotomy 500 cc x 1 today. # Patient follow-up with me in approximately 8 weeks to review the above results.   Orders Placed This Encounter  Procedures  . CBC with Differential/Platelet    Standing Status:   Future    Standing Expiration Date:   01/18/2022    All questions were answered. The patient knows to call the clinic with any problems questions or concerns.  Cc [name], [name] *   Thank you for this kind referral and the opportunity to participate in the care of this patient. A copy of today's note is routed to referring provider    Earlie Server, MD,  PhD 01/18/2021

## 2021-01-19 ENCOUNTER — Encounter: Payer: Self-pay | Admitting: *Deleted

## 2021-02-07 ENCOUNTER — Other Ambulatory Visit: Payer: Self-pay | Admitting: Internal Medicine

## 2021-02-07 DIAGNOSIS — K219 Gastro-esophageal reflux disease without esophagitis: Secondary | ICD-10-CM

## 2021-02-21 ENCOUNTER — Telehealth: Payer: Self-pay | Admitting: Cardiovascular Disease

## 2021-02-21 NOTE — Telephone Encounter (Signed)
   [facility] Pre-operative Risk Assessment    Request for surgical clearance:  1. What type of surgery is being performed? Teeth Extraction 6  2. When is this surgery scheduled? 02/27/2021  3. What type of clearance is required (medical clearance vs. Pharmacy clearance to hold med vs. Both)? Pharmacy  4. Are there any medications that need to be held prior to surgery and how long? Plavix   5. Practice name and name of physician performing surgery? Dr. Desma Maxim  6. What is your office phone number [phone]   7.   What is your office fax number [phone]  8.   Anesthesia type (None, local, MAC, general) ? Local   Larina Bras 02/21/2021, 2:58 PM  _________________________________________________________________   (provider comments below)

## 2021-02-22 NOTE — Telephone Encounter (Signed)
   Name: Christian Cole  DOB: 05/12/1951  MRN: [id number]   Primary Cardiologist: Shelva Majestic, MD  Chart reviewed as part of pre-operative protocol coverage. Patient was contacted 02/22/2021 in reference to pre-operative risk assessment for pending surgery as outlined below.  Christian Cole was last seen on 12/14/20 by Dr. Claiborne Billings.  Since that day, Christian Cole has done well. He has a history of CABG with last nuclear stress test in 2016 that was nonischemic. He has been cleared to hold plavix 5-7 days in the past without complications. We prefer to hold plavix for 5 days, but can go up to 7 days if required. Resume after when safe to do so.  Therefore, based on ACC/AHA guidelines, the patient would be at acceptable risk for the planned procedure without further cardiovascular testing.   The patient was advised that if he develops new symptoms prior to surgery to contact our office to arrange for a follow-up visit, and he verbalized understanding.  I will route this recommendation to the requesting party via Epic fax function and remove from pre-op pool. Please call with questions.  Tami Lin Yamel Bale, PA 02/22/2021, 11:13 AM

## 2021-02-28 ENCOUNTER — Other Ambulatory Visit: Payer: Self-pay | Admitting: *Deleted

## 2021-02-28 DIAGNOSIS — E291 Testicular hypofunction: Secondary | ICD-10-CM

## 2021-03-10 DIAGNOSIS — L820 Inflamed seborrheic keratosis: Secondary | ICD-10-CM | POA: Diagnosis not present

## 2021-03-10 DIAGNOSIS — L538 Other specified erythematous conditions: Secondary | ICD-10-CM | POA: Diagnosis not present

## 2021-03-10 DIAGNOSIS — L570 Actinic keratosis: Secondary | ICD-10-CM | POA: Diagnosis not present

## 2021-03-10 DIAGNOSIS — D2272 Melanocytic nevi of left lower limb, including hip: Secondary | ICD-10-CM | POA: Diagnosis not present

## 2021-03-10 DIAGNOSIS — X32XXXA Exposure to sunlight, initial encounter: Secondary | ICD-10-CM | POA: Diagnosis not present

## 2021-03-10 DIAGNOSIS — Z85828 Personal history of other malignant neoplasm of skin: Secondary | ICD-10-CM | POA: Diagnosis not present

## 2021-03-10 DIAGNOSIS — D2262 Melanocytic nevi of left upper limb, including shoulder: Secondary | ICD-10-CM | POA: Diagnosis not present

## 2021-03-10 DIAGNOSIS — D225 Melanocytic nevi of trunk: Secondary | ICD-10-CM | POA: Diagnosis not present

## 2021-03-10 DIAGNOSIS — L738 Other specified follicular disorders: Secondary | ICD-10-CM | POA: Diagnosis not present

## 2021-03-15 ENCOUNTER — Inpatient Hospital Stay: Payer: Medicare Other | Attending: Oncology

## 2021-03-15 ENCOUNTER — Encounter: Payer: Self-pay | Admitting: Oncology

## 2021-03-15 ENCOUNTER — Inpatient Hospital Stay (HOSPITAL_BASED_OUTPATIENT_CLINIC_OR_DEPARTMENT_OTHER): Payer: Medicare Other | Admitting: Oncology

## 2021-03-15 ENCOUNTER — Inpatient Hospital Stay: Payer: Medicare Other

## 2021-03-15 VITALS — BP 142/72 | HR 64 | Temp 98.9°F | Resp 18 | Wt 214.7 lb

## 2021-03-15 VITALS — BP 125/71 | HR 60

## 2021-03-15 DIAGNOSIS — Z951 Presence of aortocoronary bypass graft: Secondary | ICD-10-CM | POA: Diagnosis not present

## 2021-03-15 DIAGNOSIS — I10 Essential (primary) hypertension: Secondary | ICD-10-CM | POA: Diagnosis not present

## 2021-03-15 DIAGNOSIS — D696 Thrombocytopenia, unspecified: Secondary | ICD-10-CM

## 2021-03-15 DIAGNOSIS — G4730 Sleep apnea, unspecified: Secondary | ICD-10-CM | POA: Diagnosis not present

## 2021-03-15 DIAGNOSIS — D751 Secondary polycythemia: Secondary | ICD-10-CM

## 2021-03-15 DIAGNOSIS — Z79899 Other long term (current) drug therapy: Secondary | ICD-10-CM | POA: Diagnosis not present

## 2021-03-15 DIAGNOSIS — I2510 Atherosclerotic heart disease of native coronary artery without angina pectoris: Secondary | ICD-10-CM | POA: Diagnosis not present

## 2021-03-15 DIAGNOSIS — Z79890 Hormone replacement therapy (postmenopausal): Secondary | ICD-10-CM

## 2021-03-15 DIAGNOSIS — E785 Hyperlipidemia, unspecified: Secondary | ICD-10-CM | POA: Insufficient documentation

## 2021-03-15 DIAGNOSIS — K219 Gastro-esophageal reflux disease without esophagitis: Secondary | ICD-10-CM | POA: Insufficient documentation

## 2021-03-15 DIAGNOSIS — F17210 Nicotine dependence, cigarettes, uncomplicated: Secondary | ICD-10-CM | POA: Diagnosis not present

## 2021-03-15 LAB — CBC WITH DIFFERENTIAL/PLATELET
Abs Immature Granulocytes: 0.04 10*3/uL (ref 0.00–0.07)
Basophils Absolute: 0 10*3/uL (ref 0.0–0.1)
Basophils Relative: 1 %
Eosinophils Absolute: 0.2 10*3/uL (ref 0.0–0.5)
Eosinophils Relative: 2 %
HCT: 50.5 % (ref 39.0–52.0)
Hemoglobin: 17.5 g/dL — ABNORMAL HIGH (ref 13.0–17.0)
Immature Granulocytes: 1 %
Lymphocytes Relative: 36 %
Lymphs Abs: 2.8 10*3/uL (ref 0.7–4.0)
MCH: 33.3 pg (ref 26.0–34.0)
MCHC: 34.7 g/dL (ref 30.0–36.0)
MCV: 96.2 fL (ref 80.0–100.0)
Monocytes Absolute: 0.8 10*3/uL (ref 0.1–1.0)
Monocytes Relative: 10 %
Neutro Abs: 3.9 10*3/uL (ref 1.7–7.7)
Neutrophils Relative %: 50 %
Platelets: 131 10*3/uL — ABNORMAL LOW (ref 150–400)
RBC: 5.25 MIL/uL (ref 4.22–5.81)
RDW: 13.6 % (ref 11.5–15.5)
WBC: 7.7 10*3/uL (ref 4.0–10.5)
nRBC: 0 % (ref 0.0–0.2)

## 2021-03-15 LAB — IMMATURE PLATELET FRACTION: Immature Platelet Fraction: 6.7 % (ref 1.2–8.6)

## 2021-03-15 NOTE — Patient Instructions (Signed)

## 2021-03-15 NOTE — Progress Notes (Signed)
Pt here for follow up. No new concerns voiced.   

## 2021-03-15 NOTE — Progress Notes (Signed)
Hematology/Oncology Consult note [facility] [phone] [phone]   Patient Care Team: [name], Nino Glow, MD as PCP - General (Internal Medicine) Troy Sine, MD as PCP - Cardiology (Cardiology)  REFERRING PROVIDER: [name], [name] *  CHIEF COMPLAINTS/REASON FOR VISIT:  Evaluation of thrombocytopenia and erythrocytosis  HISTORY OF PRESENTING ILLNESS:  Christian Cole is a 70 y.o. male who was seen in consultation at the request of [name], [name] * for evaluation of thrombocytopenia and erythrocytosis.    Reviewed patient's labs done previously.  12/21/2020 labs showed decreased platelet counts at 1 39,000. wbc 8.4 hemoglobin 18.3. Reviewed patient's previous labs. Thrombocytopenia is chronic chronic onset , since at least 2015. No aggravating or elevated factors. Erythrocytosis, started after patient was started on testosterone replacement therapy. He previously never had erythrocytosis sent to them. Associated symptoms or signs:  Denies weight loss, fever, chills, fatigue, night sweats.  Denies hematochezia, hematuria, hematemesis, epistaxis, black tarry stool.  easy bruising.   Patient drinks 3 beers a day.  Sleep apnea, patient reports being compliant with CPAP machine  INTERVAL HISTORY Christian Cole is a 70 y.o. male who has above history reviewed by me today presents for follow up visit for management of erythrocytosis Problems and complaints are listed below: Patient has underwent phlebotomy sessions and tolerates well.  He has no new complaints. . Review of Systems  Constitutional: Negative for appetite change, chills, fatigue, fever and unexpected weight change.  HENT:   Negative for hearing loss and voice change.   Eyes: Negative for eye problems and icterus.  Respiratory: Negative for chest tightness, cough and shortness of breath.   Cardiovascular: Negative for chest pain and leg swelling.   Gastrointestinal: Negative for abdominal distention and abdominal pain.  Endocrine: Negative for hot flashes.  Genitourinary: Negative for difficulty urinating, dysuria and frequency.   Musculoskeletal: Negative for arthralgias.  Skin: Negative for itching and rash.  Neurological: Negative for light-headedness and numbness.  Hematological: Negative for adenopathy. Does not bruise/bleed easily.  Psychiatric/Behavioral: Negative for confusion.    MEDICAL HISTORY:  Past Medical History:  Diagnosis Date  . CAD (coronary artery disease)    a. s/p CABG in 2000 with LIMA-LAD, RIMA-PDA, SVG-D1, and SVG-PLA b. low-risk NST in 2016  . Cancer (HCC)    skin cancer follows Dr. Kellie Moor   . ED (erectile dysfunction)   . GERD (gastroesophageal reflux disease)   . Hyperlipidemia   . Hypertension    Followed by Dr. Claiborne Billings  . OSA on CPAP   . Sinus bradycardia   . Thrombocytopenia ([place])     SURGICAL HISTORY: Past Surgical History:  Procedure Laterality Date  . CATARACT EXTRACTION, BILATERAL  2010  . COLONOSCOPY WITH PROPOFOL N/A 04/15/2020   Procedure: COLONOSCOPY WITH PROPOFOL;  Surgeon: Robert Bellow, MD;  Location: [facility] ENDOSCOPY;  Service: Endoscopy;  Laterality: N/A;  . CORONARY ARTERY BYPASS GRAFT  2000   LIMA to the LAD,RIMA to the PDA,vein graft to the diagonal & vein graft to the PLA  . ESOPHAGOGASTRODUODENOSCOPY     Dr. Gustavo Lah  . ESOPHAGOGASTRODUODENOSCOPY  04/15/2020   Procedure: ESOPHAGOGASTRODUODENOSCOPY (EGD);  Surgeon: Robert Bellow, MD;  Location: [facility] ENDOSCOPY;  Service: Endoscopy;;  . KNEE ARTHROSCOPY  1991  . KNEE DEBRIDEMENT  1989   Fluid flush  . SHOULDER ARTHROSCOPY  2011  . SHOULDER SURGERY  2010   bone spur   . US ECHOCARDIOGRAPHY  12/12/2010   mild LA dilatation, normal LV systolic fx, EF >  55%    SOCIAL HISTORY: Social History   Socioeconomic History  . Marital status: Married    Spouse name: Not on file  . Number of children: 3  . Years of  education: 53  . Highest education level: Not on file  Occupational History  . Occupation: Retired Corporate treasurer  . Occupation: Management/Technical Support    Comment: Psychiatric nurse  Tobacco Use  . Smoking status: Current Every Day Smoker    Packs/day: 0.50    Years: 44.00    Pack years: 22.00    Types: Cigarettes  . Smokeless tobacco: Never Used  . Tobacco comment: on and off smoker   Vaping Use  . Vaping Use: Never used  Substance and Sexual Activity  . Alcohol use: Yes    Alcohol/week: 10.0 standard drinks    Types: 10 Cans of beer per week    Comment: Down to 3 beers a day from 4-6 a day   . Drug use: No  . Sexual activity: Yes    Partners: Female    Comment: Wife  Other Topics Concern  . Not on file  Social History Narrative   Bradshaw grew up in [place]. He is currently living in [place] with his wife. This is his second marriage. He has 1 daughter and 2 sons from his first marriage. He has 4 step kids. His daughter lives in [place], one son in [place], [place] and the other son in [place], [place]. 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    He enjoys wood working on his spare time. 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       He retired 04/2019 and enjoys this       2 brothers and 2 sisters he is next to youngest    Social Determinants of Radio broadcast assistant Strain: Not on file  Food Insecurity: Not on file  Transportation Needs: Not on file  Physical Activity: Not on file  Stress: Not on file  Social Connections: Not on file  Intimate Partner Violence: Not on file    FAMILY HISTORY: Family History  Problem Relation Age of Onset  . Arthritis Mother   . Heart disease Father        CAD - MI  . Stroke Father   . Hypertension Father   . Cancer Father 44       esophageal cancer   . Arthritis  Maternal Grandmother     ALLERGIES:  has No Known Allergies.  MEDICATIONS:  Current Outpatient Medications  Medication Sig Dispense Refill  . amLODipine (NORVASC) 2.5 MG tablet Take 1 tablet (2.5 mg total) by mouth daily. +5 mg daily=7.5 mg daily total 90 tablet 3  . amLODipine (NORVASC) 5 MG tablet Take 1 tablet (5 mg total) by mouth daily. 90 tablet 1  . atorvastatin (LIPITOR) 40 MG tablet TAKE 1 TABLET DAILY AT 6 P.M. (KEEP OFFICE VISIT) 90 tablet 3  . benazepril (LOTENSIN) 40 MG tablet TAKE 1 TABLET DAILY (OFFICE VISIT NEEDED) 90 tablet 3  . clopidogrel (PLAVIX) 75 MG tablet TAKE 1 TABLET DAILY 90 tablet 2  . ezetimibe (ZETIA) 10 MG tablet TAKE 1 TABLET DAILY 90 tablet 3  . NEEDLE, DISP, 18 G (BD SAFETYGLIDE NEEDLE) 18G X 1-1/2" MISC Draw up medication with this needle 30 each  0  . pantoprazole (PROTONIX) 40 MG tablet TAKE 1 TABLET BY MOUTH EVERY DAY 30 MINUTES BEFORE A MEAL 90 tablet 3  . SYRINGE-NEEDLE, DISP, 3 ML 23G X 1-1/2" 3 ML MISC Use as directed 30 each 0  . testosterone cypionate (DEPOTESTOSTERONE CYPIONATE) 200 MG/ML injection Inject 1.5 mLs (300 mg total) into the muscle every 14 (fourteen) days. 10 mL 0   No current facility-administered medications for this visit.     PHYSICAL EXAMINATION: ECOG PERFORMANCE STATUS: 0 - Asymptomatic Vitals:   03/15/21 1322  BP: (!) 142/72  Pulse: 64  Resp: 18  Temp: 98.9 F (37.2 C)   Filed Weights   03/15/21 1322  Weight: 214 lb 11.2 oz (97.4 kg)    Physical Exam Constitutional:      General: He is not in acute distress. HENT:     Head: Normocephalic and atraumatic.  Eyes:     General: No scleral icterus. Cardiovascular:     Rate and Rhythm: Normal rate and regular rhythm.     Heart sounds: Normal heart sounds.  Pulmonary:     Effort: Pulmonary effort is normal. No respiratory distress.     Breath sounds: No wheezing.  Abdominal:     General: Bowel sounds are normal. There is no distension.     Palpations: Abdomen  is soft.  Musculoskeletal:        General: No deformity. Normal range of motion.     Cervical back: Normal range of motion and neck supple.  Skin:    General: Skin is warm and dry.     Findings: No erythema or rash.  Neurological:     Mental Status: He is alert and oriented to person, place, and time. Mental status is at baseline.     Cranial Nerves: No cranial nerve deficit.     Coordination: Coordination normal.  Psychiatric:        Mood and Affect: Mood normal.      LABORATORY DATA:  I have reviewed the data as listed Lab Results  Component Value Date   WBC 7.7 03/15/2021   HGB 17.5 (H) 03/15/2021   HCT 50.5 03/15/2021   MCV 96.2 03/15/2021   PLT 131 (L) 03/15/2021   Recent Labs    06/15/20 1122 12/21/20 1042  NA 138 138  K 4.1 4.5  CL 105 103  CO2 26 25  GLUCOSE 99 107*  BUN 13 17  CREATININE 0.93 1.12  CALCIUM 9.3 9.6  PROT 6.7 6.9  ALBUMIN 4.5 4.6  AST 21 22  ALT 21 22  ALKPHOS 71 71  BILITOT 0.8 0.9   Iron/TIBC/Ferritin/ %Sat No results found for: IRON, TIBC, FERRITIN, IRONPCTSAT    RADIOGRAPHIC STUDIES: I have personally reviewed the radiological images as listed and agreed with the findings in the report.  No results found.   ASSESSMENT & PLAN:  1. Long-term current use of testosterone replacement therapy   2. Erythrocytosis   3. Thrombocytopenia (HCC)    #Thrombocytopenia Work-up showed normal folate and vitamin B12 level, negative flowcytometry and monoclonal gammopathy workup.  Hepatitis B surface antigen tested negative in 2019.  Hep C negative Platelet counts are stable.  Continue monitor.  #Erythrocytosis, likely secondary due to testosterone replacement therapy. He tolerated therapeutic phlebotomy. Labs reviewed and are discussed with patient.  CBC showed hemoglobin of 17.5 with hematocrit of 50.5 Proceed with phlebotomy 500 cc x 1 today Repeat H&H in 6 weeks, 12 weeks, 18 weeks +/- phlebotomy.  Follow-up with me in  24 weeks, lab  CBC CMP, MD +/- phlebotomy.   All questions were answered. The patient knows to call the clinic with any problems questions or concerns.  Cc [name], [name] *   Thank you for this kind referral and the opportunity to participate in the care of this patient. A copy of today's note is routed to referring provider    Earlie Server, MD, PhD 03/15/2021

## 2021-03-15 NOTE — Progress Notes (Signed)
Therapeutic phlebotomy performed in left AC using 20g angiocath. Procedure started at 1414 and ended at 1430. Pt tolerated procedure well. Offered beverage and snack post-procedure, but pt declined. Vital signs stable at discharge.

## 2021-03-17 ENCOUNTER — Other Ambulatory Visit: Payer: Self-pay

## 2021-03-17 ENCOUNTER — Other Ambulatory Visit: Payer: Medicare Other

## 2021-03-17 DIAGNOSIS — E291 Testicular hypofunction: Secondary | ICD-10-CM | POA: Diagnosis not present

## 2021-03-18 LAB — TESTOSTERONE: Testosterone: >1500 ng/dL — ABNORMAL HIGH (ref 264–916)

## 2021-03-18 LAB — HEMATOCRIT: Hematocrit: 49.2 % (ref 37.5–51.0)

## 2021-03-20 ENCOUNTER — Encounter: Payer: Self-pay | Admitting: *Deleted

## 2021-03-24 ENCOUNTER — Other Ambulatory Visit: Payer: Self-pay | Admitting: Cardiovascular Disease

## 2021-03-26 ENCOUNTER — Telehealth: Payer: Self-pay | Admitting: Urology

## 2021-03-26 NOTE — Telephone Encounter (Signed)
Testosterone level is significantly elevated >1500.  When was his last injection in relation to his blood draw?

## 2021-03-27 ENCOUNTER — Encounter: Payer: Self-pay | Admitting: *Deleted

## 2021-04-13 ENCOUNTER — Telehealth: Payer: Self-pay | Admitting: Cardiovascular Disease

## 2021-04-13 MED ORDER — CLOPIDOGREL BISULFATE 75 MG PO TABS: 1.0000 | ORAL_TABLET | Freq: Every day | ORAL | 2 refills | Status: DC

## 2021-04-13 NOTE — Telephone Encounter (Signed)
*  STAT* If patient is at the pharmacy, call can be transferred to refill team.   1. Which medications need to be refilled? (please list name of each medication and dose if known) clopidogrel (PLAVIX) 75 MG tablet  2. Which pharmacy/location (including street and city if local pharmacy) is medication to be sent to? [facility] [place], [place]  3. Do they need a 30 day or 90 day supply? 90 day supply   This pt is out of this medication

## 2021-04-13 NOTE — Telephone Encounter (Signed)
Refills has been sent to the pharmacy. 

## 2021-04-17 ENCOUNTER — Other Ambulatory Visit: Payer: Self-pay | Admitting: Urology

## 2021-04-17 NOTE — Telephone Encounter (Signed)
Patient called in today and states his last injection was 03/13/2021 right before his lab work on 03/17/2021.

## 2021-04-18 NOTE — Telephone Encounter (Signed)
Please schedule a lab visit for a trough T level

## 2021-04-20 ENCOUNTER — Encounter: Payer: Self-pay | Admitting: Oncology

## 2021-04-21 ENCOUNTER — Encounter: Payer: Self-pay | Admitting: Urology

## 2021-04-26 ENCOUNTER — Inpatient Hospital Stay: Payer: Medicare Other | Attending: Oncology

## 2021-04-26 ENCOUNTER — Other Ambulatory Visit: Payer: Self-pay

## 2021-04-26 ENCOUNTER — Inpatient Hospital Stay: Payer: Medicare Other

## 2021-04-26 DIAGNOSIS — D696 Thrombocytopenia, unspecified: Secondary | ICD-10-CM | POA: Insufficient documentation

## 2021-04-26 DIAGNOSIS — D751 Secondary polycythemia: Secondary | ICD-10-CM

## 2021-04-26 LAB — HEMOGLOBIN AND HEMATOCRIT, BLOOD
HCT: 48.7 % (ref 39.0–52.0)
Hemoglobin: 16.6 g/dL (ref 13.0–17.0)

## 2021-04-26 NOTE — Progress Notes (Signed)
Pt had labs today. Pt denies complaints. HCT 48.7 - per MD order - no phleb when HCT less than 50.  Pt aware and discharged.  Rtc as scheduled.

## 2021-04-27 ENCOUNTER — Other Ambulatory Visit: Payer: Self-pay

## 2021-04-27 ENCOUNTER — Other Ambulatory Visit: Payer: Medicare Other

## 2021-04-27 ENCOUNTER — Encounter: Payer: Self-pay | Admitting: Oncology

## 2021-04-27 DIAGNOSIS — E291 Testicular hypofunction: Secondary | ICD-10-CM

## 2021-04-28 LAB — TESTOSTERONE: Testosterone: 819 ng/dL (ref 264–916)

## 2021-05-01 ENCOUNTER — Telehealth: Payer: Self-pay | Admitting: *Deleted

## 2021-05-01 NOTE — Telephone Encounter (Signed)
Notified patient as instructed, patient pleased. Discussed follow-up appointments, patient agrees  

## 2021-05-01 NOTE — Telephone Encounter (Signed)
-----   Message from Abbie Sons, MD sent at 04/30/2021  8:41 AM EDT ----- Trough testosterone level is too high at 819.  Recommend decreasing dose to 1.2 cc every 2 weeks.  Repeat trough testosterone level 6 weeks

## 2021-05-17 ENCOUNTER — Other Ambulatory Visit: Payer: Self-pay | Admitting: Internal Medicine

## 2021-05-17 ENCOUNTER — Encounter: Payer: Self-pay | Admitting: Oncology

## 2021-05-17 DIAGNOSIS — I10 Essential (primary) hypertension: Secondary | ICD-10-CM

## 2021-05-19 NOTE — Progress Notes (Signed)
[facility] [place], [place] [address]  Pulmonary Sleep Medicine   Office Visit Note  Patient Name: Christian Cole DOB: Sep 29, 1951 MRN [id number]    Chief Complaint: Obstructive Sleep Apnea visit  Brief History:  Christian Cole is seen today for initial consult to establish care for ongoing PAP therapy.  The patient has a 10+ year history of sleep apnea. Patient is using PAP nightly.  Prior to therapy, patient had a history of EDS, snoring and non-restorative sleep.  The patient feels better after sleeping with PAP.  The patient reports benefiting from PAP use. Reported sleepiness is  resolved and the Epworth Sleepiness Score is 2 out of 24. The patient never take naps. The patient states he has no complaints.  The compliance download shows  compliance with an average use time of 7:40 hours@ 100%. The AHI is slightly elevated @ 5.5 events. The patient does not complain of limb movements disrupting sleep.  ROS  General: (-) fever, (-) chills, (-) night sweat Nose and Sinuses: (-) nasal stuffiness or itchiness, (-) postnasal drip, (-) nosebleeds, (-) sinus trouble. Mouth and Throat: (-) sore throat, (-) hoarseness. Neck: (-) swollen glands, (-) enlarged thyroid, (-) neck pain. Respiratory: - cough, - shortness of breath, - wheezing. Neurologic: - numbness, - tingling. Psychiatric: - anxiety, - depression   Current Medication: Outpatient Encounter Medications as of 05/22/2021  Medication Sig   amLODipine (NORVASC) 2.5 MG tablet Take 1 tablet (2.5 mg total) by mouth daily. +5 mg daily=7.5 mg daily total   amLODipine (NORVASC) 5 MG tablet TAKE 1 TABLET DAILY   atorvastatin (LIPITOR) 40 MG tablet TAKE 1 TABLET DAILY AT 6 P.M. (KEEP OFFICE VISIT)   benazepril (LOTENSIN) 40 MG tablet Take 1 tablet (40 mg total) by mouth daily.   clopidogrel (PLAVIX) 75 MG tablet Take 1 tablet (75 mg total) by mouth daily.   ezetimibe (ZETIA) 10 MG tablet TAKE 1 TABLET DAILY    NEEDLE, DISP, 18 G (BD SAFETYGLIDE NEEDLE) 18G X 1-1/2" MISC Draw up medication with this needle   pantoprazole (PROTONIX) 40 MG tablet TAKE 1 TABLET BY MOUTH EVERY DAY 30 MINUTES BEFORE A MEAL   SYRINGE-NEEDLE, DISP, 3 ML 23G X 1-1/2" 3 ML MISC Use as directed   testosterone cypionate (DEPOTESTOSTERONE CYPIONATE) 200 MG/ML injection ADMINISTER 1.5 ML(300 MG) IN THE MUSCLE EVERY 14 DAYS   No facility-administered encounter medications on file as of 05/22/2021.    Surgical History: Past Surgical History:  Procedure Laterality Date   CATARACT EXTRACTION, BILATERAL  2010   COLONOSCOPY WITH PROPOFOL N/A 04/15/2020   Procedure: COLONOSCOPY WITH PROPOFOL;  Surgeon: Robert Bellow, MD;  Location: [facility] ENDOSCOPY;  Service: Endoscopy;  Laterality: N/A;   CORONARY ARTERY BYPASS GRAFT  2000   LIMA to the LAD,RIMA to the PDA,vein graft to the diagonal & vein graft to the PLA   ESOPHAGOGASTRODUODENOSCOPY     Dr. Gustavo Lah   ESOPHAGOGASTRODUODENOSCOPY  04/15/2020   Procedure: ESOPHAGOGASTRODUODENOSCOPY (EGD);  Surgeon: Robert Bellow, MD;  Location: [facility] ENDOSCOPY;  Service: Endoscopy;;   KNEE ARTHROSCOPY  [place]   Fluid flush   SHOULDER ARTHROSCOPY  2011   SHOULDER SURGERY  2010   bone spur    US ECHOCARDIOGRAPHY  12/12/2010   mild LA dilatation, normal LV systolic fx, EF > 66%    Medical History: Past Medical History:  Diagnosis Date   CAD (coronary artery disease)    a. s/p CABG in  2000 with LIMA-LAD, RIMA-PDA, SVG-D1, and SVG-PLA b. low-risk NST in 2016   Cancer [facility])    skin cancer follows Dr. Kellie Moor    ED (erectile dysfunction)    GERD (gastroesophageal reflux disease)    Hyperlipidemia    Hypertension    Followed by Dr. Claiborne Billings   OSA on CPAP    Sinus bradycardia    Thrombocytopenia (HCC)     Family History: Non contributory to the present illness  Social History: Social History   Socioeconomic History   Marital status: Married    Spouse name:  Not on file   Number of children: 3   Years of education: 16   Highest education level: Not on file  Occupational History   Occupation: Retired Corporate treasurer   Occupation: Management/Technical Support    Comment: Psychiatric nurse  Tobacco Use   Smoking status: Every Day    Packs/day: 0.50    Years: 44.00    Pack years: 22.00    Types: Cigarettes   Smokeless tobacco: Never   Tobacco comments:    on and off smoker   Vaping Use   Vaping Use: Never used  Substance and Sexual Activity   Alcohol use: Yes    Alcohol/week: 10.0 standard drinks    Types: 10 Cans of beer per week    Comment: Down to 3 beers a day from 4-6 a day    Drug use: No   Sexual activity: Yes    Partners: Female    Comment: Wife  Other Topics Concern   Not on file  Social History Narrative   Jdyn grew up in [place]. He is currently living in [place] with his wife. This is his second marriage. He has 1 daughter and 2 sons from his first marriage. He has 4 step kids. His daughter lives in [place], one son in [place], [place] and the other son in [place], [place]. 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    He enjoys wood working on his spare time. 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       He retired 04/2019 and enjoys this       2 brothers and 2 sisters he is next to youngest    Social Determinants of Radio broadcast assistant Strain: Not on file  Food Insecurity: Not on file  Transportation Needs: Not on file  Physical Activity: Not on file  Stress: Not on file  Social Connections: Not on file  Intimate Partner Violence: Not on file    Vital Signs: Blood pressure (!) 161/83, pulse 62, height 5\' 11"  (1.803 m), weight 212 lb 4.8 oz (96.3 kg), SpO2 96 %.  Examination: General Appearance: The patient is well-developed, well-nourished, and in no  distress. Neck Circumference: 42 Skin: Gross inspection of skin unremarkable. Head: normocephalic, no gross deformities. Eyes: no gross deformities noted. ENT: ears appear grossly normal Neurologic: Alert and oriented. No involuntary movements.    EPWORTH SLEEPINESS SCALE:  Scale:  (0)= no chance of dozing; (1)= slight chance of dozing; (2)= moderate chance of dozing; (3)= high chance of dozing  Chance  Situtation    Sitting and reading: 1    Watching TV: 0    Sitting Inactive in public: 0    As a passenger in car: 1  Lying down to rest: 0    Sitting and talking: 0    Sitting quielty after lunch: 0    In a car, stopped in traffic: 0   TOTAL SCORE:   2 out of 24    SLEEP STUDIES:  PSG 01/22/11 - AHI 62.9   CPAP COMPLIANCE DATA:  Date Range: 05/18/20 - 05/17/21  Average Daily Use: 7:40 hours  Median Use: 7:40 hours  Compliance for > 4 Hours: 100% days  AHI: 5.5 respiratory events per hour  Days Used: 365/365  Mask Leak: 23.2 lpm  95th Percentile Pressure: 13cmh2o         LABS: Recent Results (from the past 2160 hour(s))  CBC with Differential/Platelet     Status: Abnormal   Collection Time: 03/15/21  1:01 PM  Result Value Ref Range   WBC 7.7 4.0 - 10.5 K/uL   RBC 5.25 4.22 - 5.81 MIL/uL   Hemoglobin 17.5 (H) 13.0 - 17.0 g/dL   HCT 50.5 39.0 - 52.0 %   MCV 96.2 80.0 - 100.0 fL   MCH 33.3 26.0 - 34.0 pg   MCHC 34.7 30.0 - 36.0 g/dL   RDW 13.6 11.5 - 15.5 %   Platelets 131 (L) 150 - 400 K/uL   nRBC 0.0 0.0 - 0.2 %   Neutrophils Relative % 50 %   Neutro Abs 3.9 1.7 - 7.7 K/uL   Lymphocytes Relative 36 %   Lymphs Abs 2.8 0.7 - 4.0 K/uL   Monocytes Relative 10 %   Monocytes Absolute 0.8 0.1 - 1.0 K/uL   Eosinophils Relative 2 %   Eosinophils Absolute 0.2 0.0 - 0.5 K/uL   Basophils Relative 1 %   Basophils Absolute 0.0 0.0 - 0.1 K/uL   Immature Granulocytes 1 %   Abs Immature Granulocytes 0.04 0.00 - 0.07 K/uL    Comment: Performed  at [facility], [place], [place] [address]  Immature Platelet Fraction     Status: None   Collection Time: 03/15/21  1:01 PM  Result Value Ref Range   Immature Platelet Fraction 6.7 1.2 - 8.6 %    Comment: Performed at [facility], [place], [place] [address]  Testosterone     Status: Abnormal   Collection Time: 03/17/21 10:14 AM  Result Value Ref Range   Testosterone >1500 (H) 264 - 916 ng/dL    Comment: Adult male reference interval is based on a population of healthy nonobese males (BMI <30) between 43 and 56 years old. [place], [place] [phone]. PMID: [id number].   Hematocrit     Status: None   Collection Time: 03/17/21 10:14 AM  Result Value Ref Range   Hematocrit 49.2 37.5 - 51.0 %  Hemoglobin and Hematocrit, Blood     Status: None   Collection Time: 04/26/21  1:01 PM  Result Value Ref Range   Hemoglobin 16.6 13.0 - 17.0 g/dL   HCT 48.7 39.0 - 52.0 %    Comment: Performed at [facility], [place], [place] [address]  Testosterone     Status: None   Collection Time: 04/27/21  8:46 AM  Result Value Ref Range   Testosterone 819 264 - 916 ng/dL    Comment: Adult male reference interval is based on a population of healthy nonobese males (BMI <30) between 32 and 70 years old. [place], [place] [phone]. PMID: [id number].     Radiology: CT CHEST LUNG CANCER SCREENING LOW DOSE WO CONTRAST  Result Date: 01/12/2021 CLINICAL DATA:  70 year old male with 46 pack-year history of smoking. Lung cancer screening. EXAM: CT CHEST WITHOUT CONTRAST LOW-DOSE FOR LUNG CANCER SCREENING TECHNIQUE: Multidetector CT imaging of the chest was performed following the standard protocol without IV contrast. COMPARISON:  01/12/2020. FINDINGS: Cardiovascular: The heart size is normal. No substantial pericardial effusion. Coronary artery calcification is evident. Atherosclerotic calcification is noted in the wall of  the thoracic aorta. Ascending thoracic aorta measures 4.3 cm diameter. Mediastinum/Nodes: No mediastinal lymphadenopathy. No evidence for gross hilar lymphadenopathy although assessment is limited by the lack of intravenous contrast on today's study. The esophagus has normal imaging features. There is no axillary lymphadenopathy. Lungs/Pleura: Centrilobular and paraseptal emphysema evident. Previously identified tiny pulmonary nodules are stable. No new suspicious nodule or mass. No focal airspace consolidation. No pleural effusion. Upper Abdomen: Unremarkable. Musculoskeletal: No worrisome lytic or sclerotic osseous abnormality. IMPRESSION: 1. Lung-RADS 2, benign appearance or behavior. Continue annual screening with low-dose chest CT without contrast in 12 months. 2. Stable 4.3 cm ascending thoracic aortic diameter. Recommend attention on follow-up annual screening chest CT. Aortic aneurysm NOS (ICD10-I71.9) 3.  Emphysema (ICD10-J43.9) and Aortic Atherosclerosis (ICD10-170.0) Electronically Signed   By: Misty Stanley M.D.   On: 01/12/2021 14:14    No results found.  No results found.    Assessment and Plan: Patient Active Problem List   Diagnosis Date Noted   Erythrocytosis 12/28/2020   Gastroesophageal reflux disease 12/21/2020   Hypogonadism in male 09/18/2020   Obesity (BMI 30-39.9) 06/15/2020   Cyst of right kidney 06/15/2020   Vitamin D deficiency 12/16/2019   Carotid artery stenosis 06/11/2019   Prediabetes 07/15/2018   Fatty liver 02/05/2018   History of prediabetes 12/04/2017   Thrombocytopenia ([place]) 12/04/2017   History of skin cancer 12/04/2017   Sinus bradycardia 08/26/2017   Tobacco use 08/26/2017   OSA (obstructive sleep apnea) 05/03/2017   Preventative health care 05/03/2016   CAD (coronary artery disease) 02/04/2014   HLD (hyperlipidemia) 01/06/2014   HTN (hypertension) 01/06/2014   ED (erectile dysfunction) 01/06/2014      The patient does tolerate PAP and  reports definite benefit from PAP use. The patient was reminded how to clean equipment  and advised to replace supplies routinely. He has some leak and will try trimming his beard. His AHI continues to be elevated and we will try a pressure change to 14 cm H20 with a two week download. The patient was also counselled on weight loss. The compliance is excellent. The AHI is 5.5.   OSA- continue excellent compliance. Increase to 14 cm h20, 2 week download, trim beard.   General Counseling: I have discussed the findings of the evaluation and examination with Christian Cole.  I have also discussed any further diagnostic evaluation thatmay be needed or ordered today. Christian Cole verbalizes understanding of the findings of todays visit. We also reviewed his medications today and discussed drug interactions and side effects including but not limited excessive drowsiness and altered mental states. We also discussed that there is always a risk not just to him but also people around him. he has been encouraged to call the office with any questions or concerns that should arise related to todays visit.  No orders of the defined types were placed in this encounter.       I have personally obtained a history, examined the patient, evaluated laboratory and imaging results, formulated the assessment and plan and placed orders.   This patient was seen today by Judson Roch  Enid Derry, PA-C in collaboration with Dr. Devona Konig.    Allyne Gee, MD [facility] Diplomate ABMS Pulmonary and Critical Care Medicine Sleep medicine

## 2021-05-22 ENCOUNTER — Ambulatory Visit (INDEPENDENT_AMBULATORY_CARE_PROVIDER_SITE_OTHER): Payer: Medicare Other | Admitting: Internal Medicine

## 2021-05-22 VITALS — BP 161/83 | HR 62 | Ht 71.0 in | Wt 212.3 lb

## 2021-05-22 DIAGNOSIS — G4733 Obstructive sleep apnea (adult) (pediatric): Secondary | ICD-10-CM

## 2021-05-22 DIAGNOSIS — Z9989 Dependence on other enabling machines and devices: Secondary | ICD-10-CM

## 2021-05-22 DIAGNOSIS — I10 Essential (primary) hypertension: Secondary | ICD-10-CM

## 2021-05-22 DIAGNOSIS — I2510 Atherosclerotic heart disease of native coronary artery without angina pectoris: Secondary | ICD-10-CM | POA: Diagnosis not present

## 2021-05-22 DIAGNOSIS — Z7189 Other specified counseling: Secondary | ICD-10-CM

## 2021-05-22 NOTE — Patient Instructions (Signed)

## 2021-05-30 ENCOUNTER — Other Ambulatory Visit: Payer: Self-pay | Admitting: *Deleted

## 2021-05-30 DIAGNOSIS — E291 Testicular hypofunction: Secondary | ICD-10-CM

## 2021-06-07 ENCOUNTER — Inpatient Hospital Stay: Payer: Medicare Other | Attending: Oncology

## 2021-06-07 ENCOUNTER — Inpatient Hospital Stay: Payer: Medicare Other

## 2021-06-07 ENCOUNTER — Other Ambulatory Visit: Payer: Self-pay

## 2021-06-07 VITALS — BP 133/71 | HR 56 | Temp 99.0°F | Resp 17

## 2021-06-07 DIAGNOSIS — D751 Secondary polycythemia: Secondary | ICD-10-CM | POA: Insufficient documentation

## 2021-06-07 DIAGNOSIS — D696 Thrombocytopenia, unspecified: Secondary | ICD-10-CM

## 2021-06-07 DIAGNOSIS — F17210 Nicotine dependence, cigarettes, uncomplicated: Secondary | ICD-10-CM | POA: Insufficient documentation

## 2021-06-07 LAB — CBC WITH DIFFERENTIAL/PLATELET
Abs Immature Granulocytes: 0.04 K/uL (ref 0.00–0.07)
Basophils Absolute: 0 K/uL (ref 0.0–0.1)
Basophils Relative: 1 %
Eosinophils Absolute: 0.1 K/uL (ref 0.0–0.5)
Eosinophils Relative: 1 %
HCT: 50 % (ref 39.0–52.0)
Hemoglobin: 16.7 g/dL (ref 13.0–17.0)
Immature Granulocytes: 1 %
Lymphocytes Relative: 35 %
Lymphs Abs: 2.8 K/uL (ref 0.7–4.0)
MCH: 32 pg (ref 26.0–34.0)
MCHC: 33.4 g/dL (ref 30.0–36.0)
MCV: 95.8 fL (ref 80.0–100.0)
Monocytes Absolute: 0.8 K/uL (ref 0.1–1.0)
Monocytes Relative: 10 %
Neutro Abs: 4.3 K/uL (ref 1.7–7.7)
Neutrophils Relative %: 52 %
Platelets: 139 K/uL — ABNORMAL LOW (ref 150–400)
RBC: 5.22 MIL/uL (ref 4.22–5.81)
RDW: 14.9 % (ref 11.5–15.5)
WBC: 8.1 K/uL (ref 4.0–10.5)
nRBC: 0 % (ref 0.0–0.2)

## 2021-06-07 LAB — COMPREHENSIVE METABOLIC PANEL WITH GFR
ALT: 22 U/L (ref 0–44)
AST: 25 U/L (ref 15–41)
Albumin: 4.2 g/dL (ref 3.5–5.0)
Alkaline Phosphatase: 69 U/L (ref 38–126)
Anion gap: 5 (ref 5–15)
BUN: 15 mg/dL (ref 8–23)
CO2: 25 mmol/L (ref 22–32)
Calcium: 8.6 mg/dL — ABNORMAL LOW (ref 8.9–10.3)
Chloride: 108 mmol/L (ref 98–111)
Creatinine, Ser: 1.11 mg/dL (ref 0.61–1.24)
GFR, Estimated: >60 mL/min
Glucose, Bld: 132 mg/dL — ABNORMAL HIGH (ref 70–99)
Potassium: 4.1 mmol/L (ref 3.5–5.1)
Sodium: 138 mmol/L (ref 135–145)
Total Bilirubin: 0.8 mg/dL (ref 0.3–1.2)
Total Protein: 6.7 g/dL (ref 6.5–8.1)

## 2021-06-07 NOTE — Progress Notes (Signed)
500 ml blood removed per MD parameters. Tolerated well. Accepted a drink post procedure. VSS. Feeling well. Discharged to home.

## 2021-06-07 NOTE — Patient Instructions (Signed)

## 2021-06-20 ENCOUNTER — Other Ambulatory Visit: Payer: Self-pay

## 2021-06-20 ENCOUNTER — Ambulatory Visit (INDEPENDENT_AMBULATORY_CARE_PROVIDER_SITE_OTHER): Payer: Medicare Other | Admitting: Internal Medicine

## 2021-06-20 ENCOUNTER — Telehealth: Payer: Self-pay | Admitting: Internal Medicine

## 2021-06-20 ENCOUNTER — Encounter: Payer: Self-pay | Admitting: Internal Medicine

## 2021-06-20 VITALS — BP 140/70 | HR 53 | Temp 97.7°F | Ht 71.0 in | Wt 212.8 lb

## 2021-06-20 DIAGNOSIS — R7303 Prediabetes: Secondary | ICD-10-CM

## 2021-06-20 DIAGNOSIS — D751 Secondary polycythemia: Secondary | ICD-10-CM

## 2021-06-20 DIAGNOSIS — E785 Hyperlipidemia, unspecified: Secondary | ICD-10-CM | POA: Diagnosis not present

## 2021-06-20 DIAGNOSIS — I2510 Atherosclerotic heart disease of native coronary artery without angina pectoris: Secondary | ICD-10-CM | POA: Diagnosis not present

## 2021-06-20 DIAGNOSIS — R7989 Other specified abnormal findings of blood chemistry: Secondary | ICD-10-CM | POA: Diagnosis not present

## 2021-06-20 DIAGNOSIS — I10 Essential (primary) hypertension: Secondary | ICD-10-CM

## 2021-06-20 DIAGNOSIS — M25561 Pain in right knee: Secondary | ICD-10-CM

## 2021-06-20 LAB — LIPID PANEL
Cholesterol: 123 mg/dL (ref 0–200)
HDL: 57.5 mg/dL (ref >39.00)
LDL Cholesterol: 50 mg/dL (ref 0–99)
NonHDL: 65.31
Total CHOL/HDL Ratio: 2
Triglycerides: 77 mg/dL (ref 0.0–149.0)
VLDL: 15.4 mg/dL (ref 0.0–40.0)

## 2021-06-20 LAB — HEMOGLOBIN A1C: Hgb A1c MFr Bld: 5.9 % (ref 4.6–6.5)

## 2021-06-20 MED ORDER — EZETIMIBE 10 MG PO TABS: 10.0000 mg | ORAL_TABLET | Freq: Every day | ORAL | 3 refills | Status: DC

## 2021-06-20 MED ORDER — ATORVASTATIN CALCIUM 40 MG PO TABS: 40.0000 mg | ORAL_TABLET | Freq: Every day | ORAL | 3 refills | Status: DC

## 2021-06-20 MED ORDER — "SYRINGE/NEEDLE (DISP) 23G X 1-1/2"" 3 ML MISC": 5 refills | Status: DC

## 2021-06-20 NOTE — Progress Notes (Signed)
Chief Complaint  Patient presents with   Follow-up   F/u 1. Htn sl elevated on norvasc 7.5 qhs and lotensin 40 mg qhs  2. Prediabetes/hld  on lipitor 40 mg qhs and zetia 10 h/o cad no chest pain f/u cards Dr. Claiborne Billings  3. Tobacco abuse rec cessation had ct chest 01/2021 + copd stable breathing no resp issues  4. 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 C/o right knee pain s/p surgery x 3 worse since cutting down a tree. Last surgery 30 years ago but worse after lifting and sitting tree was 75 lbs h/o chrondromalacia right knee and surgery movement helps declines orhto for now    Review of Systems  Constitutional:  Negative for weight loss.  HENT:  Negative for hearing loss.   Eyes:  Negative for blurred vision.  Respiratory:  Negative for shortness of breath.   Cardiovascular:  Negative for chest pain.  Gastrointestinal:  Negative for abdominal pain.  Musculoskeletal:  Positive for joint pain. Negative for falls.  Skin:  Negative for rash.  Neurological:  Negative for dizziness and headaches.  Psychiatric/Behavioral:  Negative for depression.   Past Medical History:  Diagnosis Date   CAD (coronary artery disease)    a. s/p CABG in 2000 with LIMA-LAD, RIMA-PDA, SVG-D1, and SVG-PLA b. low-risk NST in 2016   Cancer [facility])    skin cancer follows Dr. Kellie Moor    ED (erectile dysfunction)    GERD (gastroesophageal reflux disease)    Hyperlipidemia    Hypertension    Followed by Dr. Claiborne Billings   OSA on CPAP    Sinus bradycardia    Thrombocytopenia (HCC)    Tongue lesion    benign from trauma as of 06/20/21   Past Surgical History:  Procedure Laterality Date   CATARACT EXTRACTION, BILATERAL  2010   COLONOSCOPY WITH PROPOFOL N/A 04/15/2020   Procedure: COLONOSCOPY WITH PROPOFOL;  Surgeon: Robert Bellow, MD;  Location: [facility] ENDOSCOPY;  Service: Endoscopy;  Laterality: N/A;   CORONARY ARTERY BYPASS GRAFT  2000   LIMA to the LAD,RIMA to  the PDA,vein graft to the diagonal & vein graft to the PLA   ESOPHAGOGASTRODUODENOSCOPY     Dr. Gustavo Lah   ESOPHAGOGASTRODUODENOSCOPY  04/15/2020   Procedure: ESOPHAGOGASTRODUODENOSCOPY (EGD);  Surgeon: Robert Bellow, MD;  Location: [facility] ENDOSCOPY;  Service: Endoscopy;;   KNEE ARTHROSCOPY  [place]   Fluid flush   SHOULDER ARTHROSCOPY  2011   SHOULDER SURGERY  2010   bone spur    US ECHOCARDIOGRAPHY  12/12/2010   mild LA dilatation, normal LV systolic fx, EF > [id number]   Family History  Problem Relation Age of Onset   Arthritis Mother    Heart disease Father        CAD - MI   Stroke Father    Hypertension Father    Cancer Father 77       esophageal cancer    Arthritis Maternal Grandmother    Social History   Socioeconomic History   Marital status: Married    Spouse name: Not on file   Number of children: 3   Years of education: 16   Highest education level: Not on file  Occupational History   Occupation: Retired Corporate treasurer   Occupation: Management/Technical Support    Comment: Psychiatric nurse  Tobacco Use   Smoking status: Every Day    Packs/day: 0.50    Years: 44.00  Pack years: 22.00    Types: Cigarettes   Smokeless tobacco: Never   Tobacco comments:    on and off smoker   Vaping Use   Vaping Use: Never used  Substance and Sexual Activity   Alcohol use: Yes    Alcohol/week: 10.0 standard drinks    Types: 10 Cans of beer per week    Comment: Down to 3 beers a day from 4-6 a day    Drug use: No   Sexual activity: Yes    Partners: Female    Comment: Wife  Other Topics Concern   Not on file  Social History Narrative   Montarius grew up in [place]. He is currently living in [place] with his wife. This is his second marriage. He has 1 daughter and 2 sons from his first marriage. He has 4 step kids. His daughter lives in [place], one son in [place], [place] and the other son in [place], [place]. 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    He enjoys wood working on his spare time. 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       He retired 04/2019 and enjoys this       2 brothers and 2 sisters he is next to youngest    Social Determinants of Radio broadcast assistant Strain: Not on file  Food Insecurity: Not on file  Transportation Needs: Not on file  Physical Activity: Not on file  Stress: Not on file  Social Connections: Not on file  Intimate Partner Violence: Not on file   Current Meds  Medication Sig   amLODipine (NORVASC) 2.5 MG tablet Take 1 tablet (2.5 mg total) by mouth daily. +5 mg daily=7.5 mg daily total   amLODipine (NORVASC) 5 MG tablet TAKE 1 TABLET DAILY   benazepril (LOTENSIN) 40 MG tablet Take 1 tablet (40 mg total) by mouth daily.   clopidogrel (PLAVIX) 75 MG tablet Take 1 tablet (75 mg total) by mouth daily.   NEEDLE, DISP, 18 G (BD SAFETYGLIDE NEEDLE) 18G X 1-1/2" MISC Draw up medication with this needle   pantoprazole (PROTONIX) 40 MG tablet TAKE 1 TABLET BY MOUTH EVERY DAY 30 MINUTES BEFORE A MEAL   testosterone cypionate (DEPOTESTOSTERONE CYPIONATE) 200 MG/ML injection ADMINISTER 1.5 ML(300 MG) IN THE MUSCLE EVERY 14 DAYS   [DISCONTINUED] atorvastatin (LIPITOR) 40 MG tablet TAKE 1 TABLET DAILY AT 6 P.M. (KEEP OFFICE VISIT)   [DISCONTINUED] ezetimibe (ZETIA) 10 MG tablet TAKE 1 TABLET DAILY   [DISCONTINUED] SYRINGE-NEEDLE, DISP, 3 ML 23G X 1-1/2" 3 ML MISC Use as directed   No Known Allergies Recent Results (from the past 2160 hour(s))  Hemoglobin and Hematocrit, Blood     Status: None   Collection Time: 04/26/21  1:01 PM  Result Value Ref Range   Hemoglobin 16.6 13.0 - 17.0 g/dL   HCT 48.7 39.0 - 52.0 %    Comment: Performed at [facility], [place], [place] [address]  Testosterone     Status: None   Collection Time:  04/27/21  8:46 AM  Result Value Ref Range   Testosterone 819 264 - 916 ng/dL    Comment: Adult male reference interval is based on a population of healthy nonobese males (BMI <30) between 51 and 61 years old. Travison, et.al.  JCEM 2017,102;1161-1173. PMID: [id number].   CBC with Differential/Platelet     Status: Abnormal   Collection Time: 06/07/21  1:29 PM  Result Value Ref Range   WBC 8.1 4.0 - 10.5 K/uL   RBC 5.22 4.22 - 5.81 MIL/uL   Hemoglobin 16.7 13.0 - 17.0 g/dL   HCT 50.0 39.0 - 52.0 %   MCV 95.8 80.0 - 100.0 fL   MCH 32.0 26.0 - 34.0 pg   MCHC 33.4 30.0 - 36.0 g/dL   RDW 14.9 11.5 - 15.5 %   Platelets 139 (L) 150 - 400 K/uL   nRBC 0.0 0.0 - 0.2 %   Neutrophils Relative % 52 %   Neutro Abs 4.3 1.7 - 7.7 K/uL   Lymphocytes Relative 35 %   Lymphs Abs 2.8 0.7 - 4.0 K/uL   Monocytes Relative 10 %   Monocytes Absolute 0.8 0.1 - 1.0 K/uL   Eosinophils Relative 1 %   Eosinophils Absolute 0.1 0.0 - 0.5 K/uL   Basophils Relative 1 %   Basophils Absolute 0.0 0.0 - 0.1 K/uL   Immature Granulocytes 1 %   Abs Immature Granulocytes 0.04 0.00 - 0.07 K/uL    Comment: Performed at [facility], [address] [place], [place] [address]  Comprehensive metabolic panel     Status: Abnormal   Collection Time: 06/07/21  1:29 PM  Result Value Ref Range   Sodium 138 135 - 145 mmol/L   Potassium 4.1 3.5 - 5.1 mmol/L   Chloride 108 98 - 111 mmol/L   CO2 25 22 - 32 mmol/L   Glucose, Bld 132 (H) 70 - 99 mg/dL    Comment: Glucose reference range applies only to samples taken after fasting for at least 8 hours.   BUN 15 8 - 23 mg/dL   Creatinine, Ser 1.11 0.61 - 1.24 mg/dL   Calcium 8.6 (L) 8.9 - 10.3 mg/dL   Total Protein 6.7 6.5 - 8.1 g/dL   Albumin 4.2 3.5 - 5.0 g/dL   AST 25 15 - 41 U/L   ALT 22 0 - 44 U/L   Alkaline Phosphatase 69 38 - 126 U/L   Total Bilirubin 0.8 0.3 - 1.2 mg/dL   GFR, Estimated >60 >60 mL/min    Comment: (NOTE) Calculated using the CKD-EPI Creatinine  Equation (2021)    Anion gap 5 5 - 15    Comment: Performed at [facility], [address] [place], [place] [address]   Objective  Body mass index is 29.68 kg/m. Wt Readings from Last 3 Encounters:  06/20/21 212 lb 12.8 oz (96.5 kg)  05/22/21 212 lb 4.8 oz (96.3 kg)  03/15/21 214 lb 11.2 oz (97.4 kg)   Temp Readings from Last 3 Encounters:  06/20/21 97.7 F (36.5 C) (Oral)  06/07/21 99 F (37.2 C) (Tympanic)  03/15/21 98.9 F (37.2 C)   BP Readings from Last 3 Encounters:  06/20/21 140/70  06/07/21 133/71  05/22/21 (!) 161/83   Pulse Readings from Last 3 Encounters:  06/20/21 (!) 53  06/07/21 (!) 56  05/22/21 62    Physical Exam Vitals and nursing note reviewed.  Constitutional:      Appearance: Normal appearance. He is well-developed and well-groomed.  HENT:     Head: Normocephalic and atraumatic.  Eyes:     Conjunctiva/sclera: Conjunctivae normal.     Pupils: Pupils are equal, round, and reactive to light.  Cardiovascular:     Rate and Rhythm: Normal rate and regular rhythm.  Heart sounds: Normal heart sounds. No murmur heard. Abdominal:     General: Abdomen is flat. Bowel sounds are normal.  Skin:    General: Skin is warm and dry.  Neurological:     General: No focal deficit present.     Mental Status: He is alert and oriented to person, place, and time. Mental status is at baseline.     Gait: Gait normal.  Psychiatric:        Attention and Perception: Attention and perception normal.        Mood and Affect: Mood and affect normal.        Speech: Speech normal.        Behavior: Behavior normal. Behavior is cooperative.        Thought Content: Thought content normal.        Cognition and Memory: Cognition and memory normal.        Judgment: Judgment normal.    Assessment  Plan  Acute pain of right knee Declines ortho for now consider [facility] referral in the future  Otc topicals, exercise home has knee brace   Prediabetes  - Plan: Hemoglobin A1c  Coronary artery disease involving native coronary artery of native heart without angina pectoris - Plan: Lipid panel Hyperlipidemia, unspecified hyperlipidemia type - Plan: ezetimibe (ZETIA) 10 MG tablet, atorvastatin (LIPITOR) 40 MG tablet F/u cards   Low testosterone in male - Plan: SYRINGE-NEEDLE, DISP, 3 ML 23G X 1-1/2" 3 ML MISC F/u urology   Hypertension, sl elevated  On lotensin 40 mg qhs and norvasc 7.5 mg qhs   Polycythemia  Phlebotomy Q 90 days and f/u Q 6 weeks dr. Tasia Catchings   HM Declined flu shot Had prevnar, zostervax, utd pna 23 vaccine Disc shingrix for future and Tdap given Rx given today 06/20/21  Disc hep A/B vaccine prev given Rx previously with fatty liver  Pfizer 2/2 consider booster 06/20/21 pt declines     Had PSA 1.7 09/2020  Results for HAWKE, UELAND (MRN [id number]) as of 12/21/2020 10:19   Ref. Range 09/14/2020 09:13  Prostate Specific Ag, Serum Latest Ref Range: 0.0 - 4.0 ng/mL 1.7    Hep C neg  Has mvt with vitamin D3    CT chest had 01/2021 rec smoking cessation  IMPRESSION: 1. Lung-RADS 2, benign appearance or behavior. Continue annual screening with low-dose chest CT without contrast in 12 months. 2. Stable 4.3 cm ascending thoracic aortic diameter. Recommend attention on follow-up annual screening chest CT. Aortic aneurysm NOS (ICD10-I71.9) 3.  Emphysema (ICD10-J43.9) and Aortic Atherosclerosis (ICD10-170.0)     Electronically Signed   By: Misty Stanley M.D.   On: 01/12/2021 14:14   US abdomen 12/10/17 neg normal spleen size, +fatty liver, 1.3 cm right kidney cyst    Colonoscopy had 02/16/14 tubular adenoma h/o polyps repeat in 5 years [facility] pt will call  [facility] EGD/colonoscopy 04/15/20 repeat Q5 years Dr. Bary Castilla colonoscopy + hemorrhoids, and EGD with bx reflux esophagitis Q5 years  egd/colonoscopy 04/15/20 utd   Sees derm Dr. Kellie Moor saw 2019 SCC left arm x 2 f/u for 2020-2021 bx bcc forearm left sch spring 2021  right nose  bx negative Q6 months to 1 year  due spring 3 or 02/2021    Feeling great f/u [facility] Dr. Patrick North [phone] fax [phone] benefits CPAP f/u in 1 year     Local meds [facility] protonix, wellbutrin chronic med[facility]  Provider: Dr. [name] [name]

## 2021-06-20 NOTE — Telephone Encounter (Signed)
Pharmacy called to verify directions for Patient's needles and syringes. Confirmed that this is to be used every 14 days with his testosterone.

## 2021-06-20 NOTE — Patient Instructions (Addendum)
[facility] walk in but make appt clinic [facility]  Voltaren gel 4x per day  Aspercream with lidocaine or lidocaine pain patch   Heat/ice  Tdap (Tetanus, Diphtheria) Vaccine: What You Need to Know 1. Why get vaccinated? Td vaccine can prevent tetanus and diphtheria. Tetanus enters the body through cuts or wounds. Diphtheria spreads from person to person. TETANUS (T) causes painful stiffening of the muscles. Tetanus can lead to serious health problems, including being unable to open the mouth, having trouble swallowing and breathing, or death. DIPHTHERIA (D) can lead to difficulty breathing, heart failure, paralysis, or death. 2. Td vaccine Td is only for children 7 years and older, adolescents, and adults.  Td is usually given as a booster dose every 10 years, or after 5 years in the case of a severe or dirty wound or burn. Another vaccine, called "Tdap," may be used instead of Td. Tdap protects against pertussis, also known as "whooping cough," in addition to tetanus anddiphtheria. Td may be given at the same time as other vaccines. 3. Talk with your health care provider Tell your vaccination provider if the person getting the vaccine: Has had an allergic reaction after a previous dose of any vaccine that protects against tetanus or diphtheria, or has any severe, life-threatening allergies Has ever had Guillain-Barr Syndrome (also called "GBS") Has had severe pain or swelling after a previous dose of any vaccine that protects against tetanus or diphtheria In some cases, your health care provider may decide to postpone Td vaccinationuntil a future visit. People with minor illnesses, such as a cold, may be vaccinated. People who are moderately or severely ill should usually wait until they recover beforegetting Td vaccine.  Your health care provider can give you more information. 4. Risks of a vaccine reaction Pain, redness, or swelling where the shot was given, mild  fever, headache, feeling tired, and nausea, vomiting, diarrhea, or stomachache sometimes happen after Td vaccination. People sometimes faint after medical procedures, including vaccination. Tellyour provider if you feel dizzy or have vision changes or ringing in the ears.  As with any medicine, there is a very remote chance of a vaccine causing asevere allergic reaction, other serious injury, or death. 5. What if there is a serious problem? An allergic reaction could occur after the vaccinated person leaves the clinic. If you see signs of a severe allergic reaction (hives, swelling of the face and throat, difficulty breathing, a fast heartbeat, dizziness, or weakness), call 9-1-1and get the person to the nearest hospital.  For other signs that concern you, call your health care provider.  Adverse reactions should be reported to the Vaccine Adverse Event Reporting System (VAERS). Your health care provider will usually file this report, or you can do it yourself. Visit the VAERS website at www.vaers.SamedayNews.es or call (515)094-3856. VAERS is only for reporting reactions, and VAERS staff members do not give medical advice. 6. The National Vaccine Injury Compensation Program The [employer] Vaccine Injury Compensation Program (VICP) is a federal program that was created to compensate people who may have been injured by certain vaccines. Claims regarding alleged injury or death due to vaccination have a time limit for filing, which may be as short as two years. Visit the VICP website at GoldCloset.com.ee or call (802)607-7011to learn about the program and about filing a claim. 7. How can I learn more? Ask your health care provider. Call your local or state health department. Visit the website of the Food and Drug Administration (  FDA) for vaccine package inserts and additional information at TraderRating.uy. Contact the Centers for Disease Control and Prevention  (CDC): Call 706 732 2889 (1-800-CDC-INFO) or Visit CDC's website at http://hunter.com/. Vaccine Information Statement Td (Tetanus, Diphtheria) Vaccine (06/17/2020) This information is not intended to replace advice given to you by your health care provider. Make sure you discuss any questions you have with your healthcare provider. Document Revised: 08/04/2020 Document Reviewed: 08/04/2020 Elsevier Patient Education  [place].  Zoster Vaccine, Recombinant injection What is this medication? ZOSTER VACCINE (ZOS ter vak SEEN) is a vaccine used to reduce the risk of getting shingles. This vaccine is not used to treat shingles or nerve pain fromshingles. This medicine may be used for other purposes; ask your health care provider orpharmacist if you have questions. COMMON BRAND NAME(S): [facility] What should I tell my care team before I take this medication? They need to know if you have any of these conditions: cancer immune system problems an unusual or allergic reaction to Zoster vaccine, other medications, foods, dyes, or preservatives pregnant or trying to get pregnant breast-feeding How should I use this medication? This vaccine is injected into a muscle. It is given by a health care provider. A copy of Vaccine Information Statements will be given before each vaccination. Be sure to read this information carefully each time. This sheet may changeoften. Talk to your health care provider about the use of this vaccine in children.This vaccine is not approved for use in children. Overdosage: If you think you have taken too much of this medicine contact apoison control center or emergency room at once. NOTE: This medicine is only for you. Do not share this medicine with others. What if I miss a dose? Keep appointments for follow-up (booster) doses. It is important not to miss your dose. Call your health care provider if you are unable to keep anappointment. What may interact with  this medication? medicines that suppress your immune system medicines to treat cancer steroid medicines like prednisone or cortisone This list may not describe all possible interactions. Give your health care provider a list of all the medicines, herbs, non-prescription drugs, or dietary supplements you use. Also tell them if you smoke, drink alcohol, or use illegaldrugs. Some items may interact with your medicine. What should I watch for while using this medication? Visit your health care provider regularly. This vaccine, like all vaccines, may not fully protect everyone. What side effects may I notice from receiving this medication? Side effects that you should report to your doctor or health care professionalas soon as possible: allergic reactions (skin rash, itching or hives; swelling of the face, lips, or tongue) trouble breathing Side effects that usually do not require medical attention (report these toyour doctor or health care professional if they continue or are bothersome): chills headache fever nausea pain, redness, or irritation at site where injected tiredness vomiting This list may not describe all possible side effects. Call your doctor for medical advice about side effects. You may report side effects to FDA at1-800-FDA-1088. Where should I keep my medication? This vaccine is only given by a health care provider. It will not be stored athome. NOTE: This sheet is a summary. It may not cover all possible information. If you have questions about this medicine, talk to your doctor, pharmacist, orhealth care provider.  2022 Elsevier/Gold Standard (2019-12-04 16:23:07)  Knee Exercises Ask your health care provider which exercises are safe for you. Do exercises exactly as told by your health care provider and  adjust them as directed. It is normal to feel mild stretching, pulling, tightness, or discomfort as you do these exercises. Stop right away if you feel sudden pain or your  pain gets worse. Do not begin these exercises until told by your health care provider. Stretching and range-of-motion exercises These exercises warm up your muscles and joints and improve the movement and flexibility of your knee. These exercises also help to relieve pain andswelling. Knee extension, prone Lie on your abdomen (prone position) on a bed. Place your left / right knee just beyond the edge of the surface so your knee is not on the bed. You can put a towel under your left / right thigh just above your kneecap for comfort. Relax your leg muscles and allow gravity to straighten your knee (extension). You should feel a stretch behind your left / right knee. Hold this position for __________ seconds. Scoot up so your knee is supported between repetitions. Repeat __________ times. Complete this exercise __________ times a day. Knee flexion, active  Lie on your back with both legs straight. If this causes back discomfort, bend your left / right knee so your foot is flat on the floor. Slowly slide your left / right heel back toward your buttocks. Stop when you feel a gentle stretch in the front of your knee or thigh (flexion). Hold this position for __________ seconds. Slowly slide your left / right heel back to the starting position. Repeat __________ times. Complete this exercise __________ times a day. Quadriceps stretch, prone  Lie on your abdomen on a firm surface, such as a bed or padded floor. Bend your left / right knee and hold your ankle. If you cannot reach your ankle or pant leg, loop a belt around your foot and grab the belt instead. Gently pull your heel toward your buttocks. Your knee should not slide out to the side. You should feel a stretch in the front of your thigh and knee (quadriceps). Hold this position for __________ seconds. Repeat __________ times. Complete this exercise __________ times a day. Hamstring, supine Lie on your back (supine position). Loop a belt  or towel over the ball of your left / right foot. The ball of your foot is on the walking surface, right under your toes. Straighten your left / right knee and slowly pull on the belt to raise your leg until you feel a gentle stretch behind your knee (hamstring). Do not let your knee bend while you do this. Keep your other leg flat on the floor. Hold this position for __________ seconds. Repeat __________ times. Complete this exercise __________ times a day. Strengthening exercises These exercises build strength and endurance in your knee. Endurance is theability to use your muscles for a long time, even after they get tired. Quadriceps, isometric This exercise stretches the muscles in front of your thigh (quadriceps) without moving your knee joint (isometric). Lie on your back with your left / right leg extended and your other knee bent. Put a rolled towel or small pillow under your knee if told by your health care provider. Slowly tense the muscles in the front of your left / right thigh. You should see your kneecap slide up toward your hip or see increased dimpling just above the knee. This motion will push the back of the knee toward the floor. For __________ seconds, hold the muscle as tight as you can without increasing your pain. Relax the muscles slowly and completely. Repeat __________ times. Complete this exercise  __________ times a day. Straight leg raises This exercise stretches the muscles in front of your thigh (quadriceps) and the muscles that move your hips (hip flexors). Lie on your back with your left / right leg extended and your other knee bent. Tense the muscles in the front of your left / right thigh. You should see your kneecap slide up or see increased dimpling just above the knee. Your thigh may even shake a bit. Keep these muscles tight as you raise your leg 4-6 inches (10-15 cm) off the floor. Do not let your knee bend. Hold this position for __________ seconds. Keep  these muscles tense as you lower your leg. Relax your muscles slowly and completely after each repetition. Repeat __________ times. Complete this exercise __________ times a day. Hamstring, isometric Lie on your back on a firm surface. Bend your left / right knee about __________ degrees. Dig your left / right heel into the surface as if you are trying to pull it toward your buttocks. Tighten the muscles in the back of your thighs (hamstring) to "dig" as hard as you can without increasing any pain. Hold this position for __________ seconds. Release the tension gradually and allow your muscles to relax completely for __________ seconds after each repetition. Repeat __________ times. Complete this exercise __________ times a day. Hamstring curls If told by your health care provider, do this exercise while wearing ankle weights. Begin with __________ lb weights. Then increase the weight by 1 lb (0.5 kg) increments. Do not wear ankle weights that are more than __________ lb. Lie on your abdomen with your legs straight. Bend your left / right knee as far as you can without feeling pain. Keep your hips flat against the floor. Hold this position for __________ seconds. Slowly lower your leg to the starting position. Repeat __________ times. Complete this exercise __________ times a day. Squats This exercise strengthens the muscles in front of your thigh and knee (quadriceps). Stand in front of a table, with your feet and knees pointing straight ahead. You may rest your hands on the table for balance but not for support. Slowly bend your knees and lower your hips like you are going to sit in a chair. Keep your weight over your heels, not over your toes. Keep your lower legs upright so they are parallel with the table legs. Do not let your hips go lower than your knees. Do not bend lower than told by your health care provider. If your knee pain increases, do not bend as low. Hold the squat position  for __________ seconds. Slowly push with your legs to return to standing. Do not use your hands to pull yourself to standing. Repeat __________ times. Complete this exercise __________ times a day. Wall slides This exercise strengthens the muscles in front of your thigh and knee (quadriceps). Lean your back against a smooth wall or door, and walk your feet out 18-24 inches (46-61 cm) from it. Place your feet hip-width apart. Slowly slide down the wall or door until your knees bend __________ degrees. Keep your knees over your heels, not over your toes. Keep your knees in line with your hips. Hold this position for __________ seconds. Repeat __________ times. Complete this exercise __________ times a day. Straight leg raises This exercise strengthens the muscles that rotate the leg at the hip and move it away from your body (hip abductors). Lie on your side with your left / right leg in the top position. Lie so your  head, shoulder, knee, and hip line up. You may bend your bottom knee to help you keep your balance. Roll your hips slightly forward so your hips are stacked directly over each other and your left / right knee is facing forward. Leading with your heel, lift your top leg 4-6 inches (10-15 cm). You should feel the muscles in your outer hip lifting. Do not let your foot drift forward. Do not let your knee roll toward the ceiling. Hold this position for __________ seconds. Slowly return your leg to the starting position. Let your muscles relax completely after each repetition. Repeat __________ times. Complete this exercise __________ times a day. Straight leg raises This exercise stretches the muscles that move your hips away from the front of the pelvis (hip extensors). Lie on your abdomen on a firm surface. You can put a pillow under your hips if that is more comfortable. Tense the muscles in your buttocks and lift your left / right leg about 4-6 inches (10-15 cm). Keep your knee  straight as you lift your leg. Hold this position for __________ seconds. Slowly lower your leg to the starting position. Let your leg relax completely after each repetition. Repeat __________ times. Complete this exercise __________ times a day. This information is not intended to replace advice given to you by your health care provider. Make sure you discuss any questions you have with your healthcare provider. Document Revised: 08/19/2018 Document Reviewed: 08/19/2018 Elsevier Patient Education  2022 [employer].

## 2021-06-23 ENCOUNTER — Other Ambulatory Visit: Payer: Medicare Other

## 2021-06-23 ENCOUNTER — Other Ambulatory Visit: Payer: Self-pay

## 2021-06-23 ENCOUNTER — Encounter: Payer: Self-pay | Admitting: Internal Medicine

## 2021-06-23 DIAGNOSIS — E291 Testicular hypofunction: Secondary | ICD-10-CM

## 2021-06-23 DIAGNOSIS — I10 Essential (primary) hypertension: Secondary | ICD-10-CM

## 2021-06-23 MED ORDER — BENAZEPRIL HCL 40 MG PO TABS: 40.0000 mg | ORAL_TABLET | Freq: Every day | ORAL | 3 refills | Status: DC

## 2021-06-23 MED ORDER — AMLODIPINE BESYLATE 5 MG PO TABS: 5.0000 mg | ORAL_TABLET | Freq: Every day | ORAL | 3 refills | Status: DC

## 2021-06-23 MED ORDER — CLOPIDOGREL BISULFATE 75 MG PO TABS: 75.0000 mg | ORAL_TABLET | Freq: Every day | ORAL | 2 refills | Status: DC

## 2021-06-24 LAB — TESTOSTERONE: Testosterone: 699 ng/dL (ref 264–916)

## 2021-06-25 ENCOUNTER — Encounter: Payer: Self-pay | Admitting: Urology

## 2021-06-26 ENCOUNTER — Telehealth: Payer: Self-pay

## 2021-06-26 NOTE — Telephone Encounter (Signed)
My chart notification was sent

## 2021-06-26 NOTE — Telephone Encounter (Signed)
-----   Message from Abbie Sons, MD sent at 06/24/2021 10:30 AM EDT ----- Testosterone level looks good at 699.  Continue present dose

## 2021-07-14 ENCOUNTER — Other Ambulatory Visit: Payer: Self-pay

## 2021-07-14 DIAGNOSIS — D751 Secondary polycythemia: Secondary | ICD-10-CM

## 2021-07-19 ENCOUNTER — Inpatient Hospital Stay: Payer: Medicare Other

## 2021-07-20 ENCOUNTER — Telehealth: Payer: Self-pay | Admitting: Internal Medicine

## 2021-07-20 ENCOUNTER — Telehealth: Payer: TRICARE For Life (TFL) | Admitting: Physician Assistant

## 2021-07-20 DIAGNOSIS — N390 Urinary tract infection, site not specified: Secondary | ICD-10-CM

## 2021-07-20 NOTE — Telephone Encounter (Signed)
Given symptoms, needs face to face evaluation to help determine etiology - would need to confirm not prostate, etc.  Does need to go ahead and be evaluated and recommend face to face evaluation.

## 2021-07-20 NOTE — Progress Notes (Signed)
E-Visit for Urinary Problems ? ?Based on what you shared with me, I feel your condition warrants further evaluation and I recommend that you be seen for a face to face office visit.  Male bladder infections are not very common.  We worry about prostate or kidney conditions.  The standard of care is to examine the abdomen and kidneys, and to do a urine and blood test to make sure that something more serious is not going on.  We recommend that you see a provider today.  If your doctor's office is closed [facility] has the following Urgent Cares: ? ?  ?NOTE: You will not be charged for this e-visit. ? ?If you are having a true medical emergency please call 911.   ? ?  ? For an urgent face to face visit, [facility] has six urgent care centers for your convenience:  ?  ? [facility] [place] ?Get Driving Directions ?[phone] ?[address] ?[place], [place] [address] ?  ? [facility] ([place]) ?Get Driving Directions ?[phone] ?[address] ?[place], [place] [address] ? ?[facility] [facility] [place]) ?Get Driving Directions ?[phone] ?[address] ?[place],  [place]  [address] ? ?[facility] ?Get Driving Directions ?[phone] ?[address] ?[place], [place] [address] ?  ?[facility] [place] ?Get Driving Directions  ?[phone] ?[address] ?[address] ?[place], [place] [address] ?  ?[facility] [place] ?Get Driving Directions ?[phone] ?[address] ?[place], [place] [address] ? ?Your MyChart E-visit questionnaire answers were reviewed by a board certified advanced clinical practitioner to complete your personal care plan based on your specific symptoms.  Thank you for using e-Visits. ?

## 2021-07-20 NOTE — Telephone Encounter (Signed)
Called to speak with Christian Cole. [name] verbalized understanding to the providers message. He states that his symptoms only started within the last 3 days and he believes his urgency is caused by swelling as he does not feel like he gets everything out of his system when he urinates. Pt was informed that Dr. Nicki Reaper wants him to be evaluated and instructed the patient to go to the [facility] to be seen for urinary symptoms. Pt verbalized understanding and had no further questions.

## 2021-07-20 NOTE — Telephone Encounter (Signed)
Patient having dark urine, urinary frequency, decreased urine output, and pelvic pain. Ongoing symptoms for 3 days.   Patient tried to do an E-visit but was informed to be seen in person for urine testing.   No appointments available in our office or with any of the other [facility] sites. Patient wanting to know if he can have just the urine orders done as he does not want to go over the weekend and symptoms get worse.   Please advise

## 2021-07-21 ENCOUNTER — Other Ambulatory Visit: Payer: Self-pay

## 2021-07-21 ENCOUNTER — Inpatient Hospital Stay: Payer: Medicare Other

## 2021-07-21 ENCOUNTER — Inpatient Hospital Stay: Payer: Medicare Other | Attending: Oncology

## 2021-07-21 DIAGNOSIS — D696 Thrombocytopenia, unspecified: Secondary | ICD-10-CM | POA: Diagnosis not present

## 2021-07-21 DIAGNOSIS — D751 Secondary polycythemia: Secondary | ICD-10-CM | POA: Diagnosis not present

## 2021-07-21 LAB — HEMOGLOBIN AND HEMATOCRIT, BLOOD
HCT: 47.6 % (ref 39.0–52.0)
Hemoglobin: 15.9 g/dL (ref 13.0–17.0)

## 2021-07-21 NOTE — Progress Notes (Signed)
HCT 47.6. Per MD parameters, no phlebotomy is required today. Pt informed of results.

## 2021-07-25 ENCOUNTER — Other Ambulatory Visit: Payer: Self-pay | Admitting: Urology

## 2021-07-30 NOTE — Progress Notes (Signed)
07/31/2021 10:13 AM   Christian Cole June 08, 1951 [id number]  Referring provider: [name], Nino Glow, MD [place],  [place] [address]  Chief Complaint  Patient presents with   Follow-up    Urologic history: 1.  Hypogonadism TRT testosterone cypionate  2.  Erectile dysfunction Penile prosthesis; [facility] 06/2019   HPI: 70 y.o. male called for an acute visit for evaluation of a weak urinary stream.  3 month history of progressive urinary hesitancy and decreased force and caliber of his urinary stream Sensation of incomplete emptying Symptoms are intermittent No spraying of his urinary stream Denies dysuria, gross hematuria Denies flank, abdominal or pelvic pain  PMH: Past Medical History:  Diagnosis Date   CAD (coronary artery disease)    a. s/p CABG in 2000 with LIMA-LAD, RIMA-PDA, SVG-D1, and SVG-PLA b. low-risk NST in 2016   Cancer [facility])    skin cancer follows Dr. Kellie Moor    ED (erectile dysfunction)    GERD (gastroesophageal reflux disease)    Hyperlipidemia    Hypertension    Followed by Dr. Claiborne Billings   OSA on CPAP    Sinus bradycardia    Thrombocytopenia (HCC)    Tongue lesion    benign from trauma as of 06/20/21    Surgical History: Past Surgical History:  Procedure Laterality Date   CATARACT EXTRACTION, BILATERAL  2010   COLONOSCOPY WITH PROPOFOL N/A 04/15/2020   Procedure: COLONOSCOPY WITH PROPOFOL;  Surgeon: Robert Bellow, MD;  Location: [facility] ENDOSCOPY;  Service: Endoscopy;  Laterality: N/A;   CORONARY ARTERY BYPASS GRAFT  2000   LIMA to the LAD,RIMA to the PDA,vein graft to the diagonal & vein graft to the PLA   ESOPHAGOGASTRODUODENOSCOPY     Dr. Gustavo Lah   ESOPHAGOGASTRODUODENOSCOPY  04/15/2020   Procedure: ESOPHAGOGASTRODUODENOSCOPY (EGD);  Surgeon: Robert Bellow, MD;  Location: [facility] ENDOSCOPY;  Service: Endoscopy;;   KNEE ARTHROSCOPY  [place]   Fluid flush   SHOULDER ARTHROSCOPY  2011   SHOULDER  SURGERY  2010   bone spur    US ECHOCARDIOGRAPHY  12/12/2010   mild LA dilatation, normal LV systolic fx, EF > [id number]    Home Medications:  Allergies as of 07/31/2021   No Known Allergies      Medication List        Accurate as of July 31, 2021 10:13 AM. If you have any questions, ask your nurse or doctor.          amLODipine 2.5 MG tablet Commonly known as: Norvasc Take 1 tablet (2.5 mg total) by mouth daily. +5 mg daily=7.5 mg daily total   amLODipine 5 MG tablet Commonly known as: NORVASC Take 1 tablet (5 mg total) by mouth daily.   atorvastatin 40 MG tablet Commonly known as: LIPITOR Take 1 tablet (40 mg total) by mouth daily. At night   BD SafetyGlide Needle 18G X 1-1/2" Misc Generic drug: NEEDLE (DISP) 18 G Draw up medication with this needle   benazepril 40 MG tablet Commonly known as: LOTENSIN Take 1 tablet (40 mg total) by mouth daily.   clopidogrel 75 MG tablet Commonly known as: PLAVIX Take 1 tablet (75 mg total) by mouth daily.   ezetimibe 10 MG tablet Commonly known as: ZETIA Take 1 tablet (10 mg total) by mouth daily.   pantoprazole 40 MG tablet Commonly known as: PROTONIX TAKE 1 TABLET BY MOUTH EVERY DAY 30 MINUTES BEFORE A MEAL   SYRINGE-NEEDLE (DISP) 3 ML 23G  X 1-1/2" 3 ML Misc Use as directed QOWEEK   testosterone cypionate 200 MG/ML injection Commonly known as: DEPOTESTOSTERONE CYPIONATE ADMINISTER 1.5 ML(300 MG) IN THE MUSCLE EVERY 14 DAYS        Allergies: No Known Allergies  Family History: Family History  Problem Relation Age of Onset   Arthritis Mother    Heart disease Father        CAD - MI   Stroke Father    Hypertension Father    Cancer Father 35       esophageal cancer    Arthritis Maternal Grandmother     Social History:  reports that he has been smoking cigarettes. He has a 22.00 pack-year smoking history. He has never used smokeless tobacco. He reports current alcohol use of about 10.0 standard drinks per  week. He reports that he does not use drugs.   Physical Exam: BP (!) 152/82   Pulse (!) 56   Ht '5\' 11"'$  (1.803 m)   Wt 214 lb (97.1 kg)   BMI 29.85 kg/m   Constitutional:  Alert and oriented, No acute distress. HEENT: [place] AT, moist mucus membranes.  Trachea midline, no masses. Cardiovascular: No clubbing, cyanosis, or edema. Respiratory: Normal respiratory effort, no increased work of breathing. GU: Meatus normal in appearance; penile prosthesis in good position.  Prostate 60+ cc, smooth without nodules   Assessment & Plan:    1.  BPH with obstructive voiding symptoms Bladder scan PVR ~ 325 mL Rx tamsulosin 0.4 mg daily 1 month follow-up for symptom check and repeat PVR Instructed to call earlier for worsening voiding symptoms   Abbie Sons, MD  [facility] [address], [place], [place] [address] [phone]

## 2021-07-31 ENCOUNTER — Encounter: Payer: Self-pay | Admitting: Urology

## 2021-07-31 ENCOUNTER — Ambulatory Visit (INDEPENDENT_AMBULATORY_CARE_PROVIDER_SITE_OTHER): Payer: Medicare Other | Admitting: Urology

## 2021-07-31 ENCOUNTER — Encounter: Payer: Self-pay | Admitting: Oncology

## 2021-07-31 ENCOUNTER — Other Ambulatory Visit: Payer: Self-pay

## 2021-07-31 VITALS — BP 152/82 | HR 56 | Ht 71.0 in | Wt 214.0 lb

## 2021-07-31 DIAGNOSIS — N401 Enlarged prostate with lower urinary tract symptoms: Secondary | ICD-10-CM

## 2021-07-31 DIAGNOSIS — R3911 Hesitancy of micturition: Secondary | ICD-10-CM

## 2021-07-31 DIAGNOSIS — R339 Retention of urine, unspecified: Secondary | ICD-10-CM | POA: Diagnosis not present

## 2021-07-31 DIAGNOSIS — R3912 Poor urinary stream: Secondary | ICD-10-CM

## 2021-07-31 DIAGNOSIS — N138 Other obstructive and reflux uropathy: Secondary | ICD-10-CM | POA: Diagnosis not present

## 2021-07-31 LAB — BLADDER SCAN AMB NON-IMAGING: Scan Result: >325

## 2021-07-31 MED ORDER — TAMSULOSIN HCL 0.4 MG PO CAPS: 0.4000 mg | ORAL_CAPSULE | Freq: Every day | ORAL | 3 refills | Status: DC

## 2021-08-03 ENCOUNTER — Other Ambulatory Visit: Payer: Self-pay

## 2021-08-03 DIAGNOSIS — E291 Testicular hypofunction: Secondary | ICD-10-CM

## 2021-08-04 ENCOUNTER — Other Ambulatory Visit: Payer: Medicare Other

## 2021-08-04 ENCOUNTER — Other Ambulatory Visit: Payer: Self-pay

## 2021-08-04 DIAGNOSIS — E291 Testicular hypofunction: Secondary | ICD-10-CM | POA: Diagnosis not present

## 2021-08-05 LAB — TESTOSTERONE: Testosterone: 753 ng/dL (ref 264–916)

## 2021-08-08 ENCOUNTER — Emergency Department: Payer: Medicare Other

## 2021-08-08 ENCOUNTER — Other Ambulatory Visit: Payer: Self-pay

## 2021-08-08 ENCOUNTER — Encounter: Payer: Self-pay | Admitting: Emergency Medicine

## 2021-08-08 ENCOUNTER — Emergency Department
Admission: EM | Admit: 2021-08-08 | Discharge: 2021-08-09 | Disposition: A | Discharge status: Home / Self Care | Payer: Medicare Other | Attending: Emergency Medicine | Admitting: Emergency Medicine

## 2021-08-08 DIAGNOSIS — Z79899 Other long term (current) drug therapy: Secondary | ICD-10-CM | POA: Diagnosis not present

## 2021-08-08 DIAGNOSIS — F17210 Nicotine dependence, cigarettes, uncomplicated: Secondary | ICD-10-CM | POA: Insufficient documentation

## 2021-08-08 DIAGNOSIS — Z96659 Presence of unspecified artificial knee joint: Secondary | ICD-10-CM | POA: Diagnosis not present

## 2021-08-08 DIAGNOSIS — Z96619 Presence of unspecified artificial shoulder joint: Secondary | ICD-10-CM | POA: Diagnosis not present

## 2021-08-08 DIAGNOSIS — I10 Essential (primary) hypertension: Secondary | ICD-10-CM | POA: Insufficient documentation

## 2021-08-08 DIAGNOSIS — R079 Chest pain, unspecified: Secondary | ICD-10-CM | POA: Diagnosis not present

## 2021-08-08 DIAGNOSIS — Z85828 Personal history of other malignant neoplasm of skin: Secondary | ICD-10-CM | POA: Diagnosis not present

## 2021-08-08 DIAGNOSIS — R0789 Other chest pain: Secondary | ICD-10-CM | POA: Insufficient documentation

## 2021-08-08 DIAGNOSIS — I2510 Atherosclerotic heart disease of native coronary artery without angina pectoris: Secondary | ICD-10-CM | POA: Insufficient documentation

## 2021-08-08 LAB — COMPREHENSIVE METABOLIC PANEL
ALT: 22 U/L (ref 0–44)
AST: 22 U/L (ref 15–41)
Albumin: 4.5 g/dL (ref 3.5–5.0)
Alkaline Phosphatase: 78 U/L (ref 38–126)
Anion gap: 10 (ref 5–15)
BUN: 11 mg/dL (ref 8–23)
CO2: 26 mmol/L (ref 22–32)
Calcium: 9.3 mg/dL (ref 8.9–10.3)
Chloride: 104 mmol/L (ref 98–111)
Creatinine, Ser: 0.97 mg/dL (ref 0.61–1.24)
GFR, Estimated: >60 mL/min (ref >60)
Glucose, Bld: 92 mg/dL (ref 70–99)
Potassium: 4.2 mmol/L (ref 3.5–5.1)
Sodium: 140 mmol/L (ref 135–145)
Total Bilirubin: 1 mg/dL (ref 0.3–1.2)
Total Protein: 7.4 g/dL (ref 6.5–8.1)

## 2021-08-08 LAB — CBC WITH DIFFERENTIAL/PLATELET
Abs Immature Granulocytes: 0.04 10*3/uL (ref 0.00–0.07)
Basophils Absolute: 0.1 10*3/uL (ref 0.0–0.1)
Basophils Relative: 1 %
Eosinophils Absolute: 0.1 10*3/uL (ref 0.0–0.5)
Eosinophils Relative: 1 %
HCT: 47.5 % (ref 39.0–52.0)
Hemoglobin: 16.3 g/dL (ref 13.0–17.0)
Immature Granulocytes: 0 %
Lymphocytes Relative: 30 %
Lymphs Abs: 2.8 10*3/uL (ref 0.7–4.0)
MCH: 32.5 pg (ref 26.0–34.0)
MCHC: 34.3 g/dL (ref 30.0–36.0)
MCV: 94.6 fL (ref 80.0–100.0)
Monocytes Absolute: 0.8 10*3/uL (ref 0.1–1.0)
Monocytes Relative: 9 %
Neutro Abs: 5.3 10*3/uL (ref 1.7–7.7)
Neutrophils Relative %: 59 %
Platelets: 155 10*3/uL (ref 150–400)
RBC: 5.02 MIL/uL (ref 4.22–5.81)
RDW: 14 % (ref 11.5–15.5)
WBC: 9.1 10*3/uL (ref 4.0–10.5)
nRBC: 0 % (ref 0.0–0.2)

## 2021-08-08 LAB — TROPONIN I (HIGH SENSITIVITY): Troponin I (High Sensitivity): 14 ng/L (ref <18)

## 2021-08-08 NOTE — ED Triage Notes (Signed)
Patient ambulatory to triage with steady gait, without difficulty or distress noted; pt reports "warm sensation" to left side of chest radiating into neck since this am; denies any accomp symptoms

## 2021-08-09 DIAGNOSIS — R0789 Other chest pain: Secondary | ICD-10-CM | POA: Diagnosis not present

## 2021-08-09 LAB — TROPONIN I (HIGH SENSITIVITY): Troponin I (High Sensitivity): 14 ng/L (ref <18)

## 2021-08-09 NOTE — ED Provider Notes (Signed)
[facility] Emergency Department Provider Note  ____________________________________________   None    (approximate)  I have reviewed the triage vital signs and the nursing notes.   HISTORY  Chief Complaint Chest Pain    HPI Christian Cole is a 70 y.o. male with medical history as listed below which notably includes prior 5 vessel CABG.  He is generally healthy and well, however, and he presents for evaluation of several episodes since this morning of a warm sensation to the left side of his chest that radiates up into the neck.  He said it is mild and does not feel like pain but it is worrisome to him given his cardiac history.  He said he always feels embarrassed to be here but just wanted to make sure everything was okay.  He denies any numbness and tingling in his arms.  He has had no difficulty with ambulation.  He has had no focal weakness.  He denies shortness of breath, fever, sore throat, nausea, vomiting, and abdominal pain.  Nothing in particular seems to make the symptoms better or worse.     Past Medical History:  Diagnosis Date   CAD (coronary artery disease)    a. s/p CABG in 2000 with LIMA-LAD, RIMA-PDA, SVG-D1, and SVG-PLA b. low-risk NST in 2016   Cancer [facility])    skin cancer follows Dr. Kellie Moor    ED (erectile dysfunction)    GERD (gastroesophageal reflux disease)    Hyperlipidemia    Hypertension    Followed by Dr. Claiborne Billings   OSA on CPAP    Sinus bradycardia    Thrombocytopenia (HCC)    Tongue lesion    benign from trauma as of 06/20/21    Patient Active Problem List   Diagnosis Date Noted   Low testosterone in male 06/20/2021   Erythrocytosis 12/28/2020   Gastroesophageal reflux disease 12/21/2020   Hypogonadism in male 09/18/2020   Obesity (BMI 30-39.9) 06/15/2020   Cyst of right kidney 06/15/2020   Vitamin D deficiency 12/16/2019   Carotid artery stenosis 06/11/2019   Prediabetes 07/15/2018   Fatty liver 02/05/2018    History of prediabetes 12/04/2017   Thrombocytopenia ([place]) 12/04/2017   History of skin cancer 12/04/2017   Sinus bradycardia 08/26/2017   Tobacco use 08/26/2017   OSA (obstructive sleep apnea) 05/03/2017   Preventative health care 05/03/2016   CAD (coronary artery disease) 02/04/2014   HLD (hyperlipidemia) 01/06/2014   HTN (hypertension) 01/06/2014   ED (erectile dysfunction) 01/06/2014    Past Surgical History:  Procedure Laterality Date   CATARACT EXTRACTION, BILATERAL  2010   COLONOSCOPY WITH PROPOFOL N/A 04/15/2020   Procedure: COLONOSCOPY WITH PROPOFOL;  Surgeon: Robert Bellow, MD;  Location: [facility] ENDOSCOPY;  Service: Endoscopy;  Laterality: N/A;   CORONARY ARTERY BYPASS GRAFT  2000   LIMA to the LAD,RIMA to the PDA,vein graft to the diagonal & vein graft to the PLA   ESOPHAGOGASTRODUODENOSCOPY     Dr. Gustavo Lah   ESOPHAGOGASTRODUODENOSCOPY  04/15/2020   Procedure: ESOPHAGOGASTRODUODENOSCOPY (EGD);  Surgeon: Robert Bellow, MD;  Location: [facility] ENDOSCOPY;  Service: Endoscopy;;   KNEE ARTHROSCOPY  [place]   Fluid flush   SHOULDER ARTHROSCOPY  2011   SHOULDER SURGERY  2010   bone spur    US ECHOCARDIOGRAPHY  12/12/2010   mild LA dilatation, normal LV systolic fx, EF > 80%    Prior to Admission medications   Medication Sig Start Date End Date  Taking? Authorizing Provider  amLODipine (NORVASC) 2.5 MG tablet Take 1 tablet (2.5 mg total) by mouth daily. +5 mg daily=7.5 mg daily total 12/21/20   [name], Nino Glow, MD  amLODipine (NORVASC) 5 MG tablet Take 1 tablet (5 mg total) by mouth daily. 06/23/21   [name], Nino Glow, MD  atorvastatin (LIPITOR) 40 MG tablet Take 1 tablet (40 mg total) by mouth daily. At night 06/20/21   [name], Nino Glow, MD  benazepril (LOTENSIN) 40 MG tablet Take 1 tablet (40 mg total) by mouth daily. 06/23/21 06/23/22  [name], Nino Glow, MD  clopidogrel (PLAVIX) 75 MG tablet Take 1 tablet (75 mg total) by  mouth daily. 06/23/21   [name], Nino Glow, MD  ezetimibe (ZETIA) 10 MG tablet Take 1 tablet (10 mg total) by mouth daily. 06/20/21   [name], Nino Glow, MD  NEEDLE, DISP, 18 G (BD SAFETYGLIDE NEEDLE) 18G X 1-1/2" MISC Draw up medication with this needle 01/06/21   Stoioff, Ronda Fairly, MD  pantoprazole (PROTONIX) 40 MG tablet TAKE 1 TABLET BY MOUTH EVERY DAY 30 MINUTES BEFORE A MEAL 02/07/21   [name], Nino Glow, MD  SYRINGE-NEEDLE, DISP, 3 ML 23G X 1-1/2" 3 ML MISC Use as directed QOWEEK 06/20/21   [name], Nino Glow, MD  tamsulosin (FLOMAX) 0.4 MG CAPS capsule Take 1 capsule (0.4 mg total) by mouth daily. 07/31/21   Stoioff, Ronda Fairly, MD  testosterone cypionate (DEPOTESTOSTERONE CYPIONATE) 200 MG/ML injection ADMINISTER 1.5 ML(300 MG) IN THE MUSCLE EVERY 14 DAYS 07/28/21   Stoioff, Ronda Fairly, MD    Allergies Patient has no known allergies.  Family History  Problem Relation Age of Onset   Arthritis Mother    Heart disease Father        CAD - MI   Stroke Father    Hypertension Father    Cancer Father 47       esophageal cancer    Arthritis Maternal Grandmother     Social History Social History   Tobacco Use   Smoking status: Every Day    Packs/day: 0.50    Years: 44.00    Pack years: 22.00    Types: Cigarettes   Smokeless tobacco: Never   Tobacco comments:    on and off smoker   Vaping Use   Vaping Use: Never used  Substance Use Topics   Alcohol use: Yes    Alcohol/week: 10.0 standard drinks    Types: 10 Cans of beer per week    Comment: Down to 3 beers a day from 4-6 a day    Drug use: No    Review of Systems Constitutional: No fever/chills Eyes: No visual changes. ENT: No sore throat. Cardiovascular: Denies chest pain but has a warm sensation in his chest radiating to the neck. Respiratory: Denies shortness of breath. Gastrointestinal: No abdominal pain.  No nausea, no vomiting.  No diarrhea.  No constipation. Genitourinary: Negative for  dysuria. Musculoskeletal: Negative for neck pain.  Negative for back pain. Integumentary: Negative for rash. Neurological: Negative for headaches, focal weakness or numbness.   ____________________________________________   PHYSICAL EXAM:  VITAL SIGNS: ED Triage Vitals  Enc Vitals Group     BP 08/08/21 2017 (!) 151/88     Pulse Rate 08/08/21 2017 65     Resp 08/08/21 2017 18     Temp 08/08/21 2017 98 F (36.7 C)     Temp Source 08/08/21 2017 Oral     SpO2 08/08/21 2017 96 %     Weight 08/08/21  2013 98 kg (216 lb)     Height 08/08/21 2013 1.803 m (5\' 11" )     Head Circumference --      Peak Flow --      Pain Score 08/08/21 2013 3     Pain Loc --      Pain Edu? --      Excl. in [place]? --     Constitutional: Alert and oriented.  Eyes: Conjunctivae are normal.  Head: Atraumatic. Nose: No congestion/rhinnorhea. Mouth/Throat: Patient is wearing a mask. Neck: No stridor.  No meningeal signs.   Cardiovascular: Normal rate, regular rhythm. Good peripheral circulation. Respiratory: Normal respiratory effort.  No retractions. Gastrointestinal: Soft and nontender. No distention.  Musculoskeletal: No lower extremity tenderness nor edema. No gross deformities of extremities. Neurologic:  Normal speech and language. No gross focal neurologic deficits are appreciated.  Skin:  Skin is warm, dry and intact. Psychiatric: Mood and affect are normal. Speech and behavior are normal.  ____________________________________________   LABS (all labs ordered are listed, but only abnormal results are displayed)  Labs Reviewed  CBC WITH DIFFERENTIAL/PLATELET  COMPREHENSIVE METABOLIC PANEL  TROPONIN I (HIGH SENSITIVITY)  TROPONIN I (HIGH SENSITIVITY)   ____________________________________________  EKG  ED ECG REPORT I, Hinda Kehr, the attending physician, personally viewed and interpreted this ECG.  Date: 08/08/2021 EKG Time: 20: 21 Rate: 60 Rhythm: Sinus rhythm with first-degree AV  block QRS Axis: normal Intervals: Prolonged PR interval consistent with first-degree AV block, otherwise unremarkable ST/T Wave abnormalities: Non-specific ST segment / T-wave changes, but no clear evidence of acute ischemia. Narrative Interpretation: no definitive evidence of acute ischemia; does not meet STEMI criteria.  ____________________________________________  RADIOLOGY I, Hinda Kehr, personally viewed and evaluated these images (plain radiographs) as part of my medical decision making, as well as reviewing the written report by the radiologist.  ED MD interpretation: No acute abnormalities on chest x-ray  Official radiology report(s): DG Chest 2 View  Result Date: 08/08/2021 CLINICAL DATA:  Chest pain EXAM: CHEST - 2 VIEW COMPARISON:  Chest x-ray 12/26/2005, CT chest 01/11/2021 FINDINGS: Vascular clips overlie the mediastinum. The heart and mediastinal contours are unchanged. No focal consolidation. No pulmonary edema. No pleural effusion. No pneumothorax. No acute osseous abnormality. IMPRESSION: No active cardiopulmonary disease. Electronically Signed   By: Iven Finn M.D.   On: 08/08/2021 20:38    ____________________________________________   PROCEDURES   Procedure(s) performed (including Critical Care):  Procedures   ____________________________________________   INITIAL IMPRESSION / MDM / [place] / ED COURSE  As part of my medical decision making, I reviewed the following data within the [facility] notes reviewed and incorporated, Labs reviewed , EKG interpreted , Old chart reviewed, Radiograph reviewed , and Notes from prior ED visits   Differential diagnosis includes, but is not limited to, nonspecific atypical chest pain, ACS, PE, pneumonia.  Vital signs are stable other than hypertension.  Patient is well-appearing and in no distress.  Currently asymptomatic.  No ischemia on EKG.  I personally reviewed the patient's  imaging and agree with the radiologist's interpretation that there are no acute abnormality such as pneumonia or pneumothorax or widened mediastinum.  Patient has a reassuring physical exam with no focal neurological deficits.  CBC, comprehensive metabolic panel, and high-sensitivity troponin are all within normal limits.  Second high-sensitivity troponin is pending.  Doubt ACS or other emergent medical condition at this time.  I anticipate discharge with outpatient follow-up if the second troponin is  negative.  He understands and agrees with the plan.       Clinical Course as of 08/09/21 0138  Wed Aug 09, 2021  0125 Normal second high-sensitivity troponin.  No evidence of ACS or other emergent condition.  I will discharge the patient for outpatient follow-up. [CF]    Clinical Course User Index [CF] Hinda Kehr, MD     ____________________________________________  FINAL CLINICAL IMPRESSION(S) / ED DIAGNOSES  Final diagnoses:  Atypical chest pain     MEDICATIONS GIVEN DURING THIS VISIT:  Medications - No data to display   ED Discharge Orders     None        Note:  This document was prepared using Dragon voice recognition software and may include unintentional dictation errors.   Hinda Kehr, MD 08/09/21 [phone]

## 2021-08-09 NOTE — Discharge Instructions (Signed)
Your workup in the Emergency Department today was reassuring.  We did not find any specific abnormalities.  We recommend you drink plenty of fluids, take your regular medications and/or any new ones prescribed today, and follow up with the doctor(s) listed in these documents as recommended.  Return to the Emergency Department if you develop new or worsening symptoms that concern you.  

## 2021-08-11 ENCOUNTER — Telehealth: Payer: Self-pay | Admitting: Cardiovascular Disease

## 2021-08-11 NOTE — Telephone Encounter (Signed)
Spoke to pt. He report he started taking flomax a week 1/2 ago and noticed he's been experiencing on and off chest discomfort since. He state its not a heaviness, sharp, or dull feeling but more so a slight discomfort. He denies any other symptoms but report he was evaluated in the ER on 9/27 and was told everything checked out ok. Pt state he has since stopped medication yesterday and feels better today but wanted to make an appointment. Scheduler has already made an appointment for 10/24. Nurse advised pt to keep appointment. Pt also made aware of ED precaution should any new symptoms develop or worsen. Pt verbalized understanding.

## 2021-08-11 NOTE — Telephone Encounter (Signed)
Pt c/o of Chest Pain: STAT if CP now or developed within 24 hours  1. Are you having CP right now? No   2. Are you experiencing any other symptoms (ex. SOB, nausea, vomiting, sweating)? No   3. How long have you been experiencing CP? Started on Tuesday, 08/08/21  4. Is your CP continuous or coming and going?  Coming and going  5. Have you taken Nitroglycerin? No, doesn't have any  States it started a week after he started taking Flomax 0.4 MG's. He is taking it 1 tablet daily and has never experienced this prior. He reports it is a discomfort and not pain. HE has been to the hospital and they advised everything checked out fine. An appt with Dr. Claiborne Billings has been setup in October in regards to this.  ?

## 2021-08-17 ENCOUNTER — Encounter: Payer: Self-pay | Admitting: Urology

## 2021-08-18 MED ORDER — SILODOSIN 8 MG PO CAPS: 8.0000 mg | ORAL_CAPSULE | Freq: Every day | ORAL | 3 refills | Status: DC

## 2021-08-30 ENCOUNTER — Inpatient Hospital Stay (HOSPITAL_BASED_OUTPATIENT_CLINIC_OR_DEPARTMENT_OTHER): Payer: Medicare Other | Admitting: Oncology

## 2021-08-30 ENCOUNTER — Inpatient Hospital Stay: Payer: Medicare Other | Attending: Oncology

## 2021-08-30 ENCOUNTER — Inpatient Hospital Stay: Payer: Medicare Other

## 2021-08-30 ENCOUNTER — Other Ambulatory Visit: Payer: Self-pay

## 2021-08-30 ENCOUNTER — Encounter: Payer: Self-pay | Admitting: Oncology

## 2021-08-30 VITALS — BP 164/83 | HR 86 | Temp 97.6°F | Wt 216.3 lb

## 2021-08-30 DIAGNOSIS — F17210 Nicotine dependence, cigarettes, uncomplicated: Secondary | ICD-10-CM | POA: Diagnosis not present

## 2021-08-30 DIAGNOSIS — Z79890 Hormone replacement therapy (postmenopausal): Secondary | ICD-10-CM

## 2021-08-30 DIAGNOSIS — D696 Thrombocytopenia, unspecified: Secondary | ICD-10-CM | POA: Insufficient documentation

## 2021-08-30 DIAGNOSIS — Z79899 Other long term (current) drug therapy: Secondary | ICD-10-CM | POA: Diagnosis not present

## 2021-08-30 DIAGNOSIS — D751 Secondary polycythemia: Secondary | ICD-10-CM | POA: Insufficient documentation

## 2021-08-30 DIAGNOSIS — G4733 Obstructive sleep apnea (adult) (pediatric): Secondary | ICD-10-CM | POA: Insufficient documentation

## 2021-08-30 LAB — COMPREHENSIVE METABOLIC PANEL
ALT: 23 U/L (ref 0–44)
AST: 25 U/L (ref 15–41)
Albumin: 4.1 g/dL (ref 3.5–5.0)
Alkaline Phosphatase: 69 U/L (ref 38–126)
Anion gap: 4 — ABNORMAL LOW (ref 5–15)
BUN: 12 mg/dL (ref 8–23)
CO2: 28 mmol/L (ref 22–32)
Calcium: 8.6 mg/dL — ABNORMAL LOW (ref 8.9–10.3)
Chloride: 105 mmol/L (ref 98–111)
Creatinine, Ser: 1.06 mg/dL (ref 0.61–1.24)
GFR, Estimated: >60 mL/min (ref >60)
Glucose, Bld: 118 mg/dL — ABNORMAL HIGH (ref 70–99)
Potassium: 4.2 mmol/L (ref 3.5–5.1)
Sodium: 137 mmol/L (ref 135–145)
Total Bilirubin: 1 mg/dL (ref 0.3–1.2)
Total Protein: 7.1 g/dL (ref 6.5–8.1)

## 2021-08-30 LAB — CBC WITH DIFFERENTIAL/PLATELET
Abs Immature Granulocytes: 0.02 10*3/uL (ref 0.00–0.07)
Basophils Absolute: 0 10*3/uL (ref 0.0–0.1)
Basophils Relative: 0 %
Eosinophils Absolute: 0.1 10*3/uL (ref 0.0–0.5)
Eosinophils Relative: 2 %
HCT: 48.2 % (ref 39.0–52.0)
Hemoglobin: 16.1 g/dL (ref 13.0–17.0)
Immature Granulocytes: 0 %
Lymphocytes Relative: 39 %
Lymphs Abs: 2.9 10*3/uL (ref 0.7–4.0)
MCH: 31.4 pg (ref 26.0–34.0)
MCHC: 33.4 g/dL (ref 30.0–36.0)
MCV: 94.1 fL (ref 80.0–100.0)
Monocytes Absolute: 0.8 10*3/uL (ref 0.1–1.0)
Monocytes Relative: 11 %
Neutro Abs: 3.6 10*3/uL (ref 1.7–7.7)
Neutrophils Relative %: 48 %
Platelets: 129 10*3/uL — ABNORMAL LOW (ref 150–400)
RBC: 5.12 MIL/uL (ref 4.22–5.81)
RDW: 14 % (ref 11.5–15.5)
WBC: 7.5 10*3/uL (ref 4.0–10.5)
nRBC: 0 % (ref 0.0–0.2)

## 2021-08-30 NOTE — Progress Notes (Signed)
Hematology/Oncology follow-up note [facility] [phone] [phone]   Patient Care Team: [name], Nino Glow, MD as PCP - General (Internal Medicine) Troy Sine, MD as PCP - Cardiology (Cardiology)  REFERRING PROVIDER: [name], [name] *  CHIEF COMPLAINTS/REASON FOR VISIT:  Evaluation of thrombocytopenia and erythrocytosis  HISTORY OF PRESENTING ILLNESS:  Christian Cole is a 70 y.o. male who was seen in consultation at the request of [name], [name] * for evaluation of thrombocytopenia and erythrocytosis.    Reviewed patient's labs done previously.  12/21/2020 labs showed decreased platelet counts at 1 39,000. wbc 8.4 hemoglobin 18.3. Reviewed patient's previous labs. Thrombocytopenia is chronic chronic onset , since at least 2015. No aggravating or elevated factors. Erythrocytosis, started after patient was started on testosterone replacement therapy. He previously never had erythrocytosis sent to them. Associated symptoms or signs:  Denies weight loss, fever, chills, fatigue, night sweats.  Denies hematochezia, hematuria, hematemesis, epistaxis, black tarry stool.  easy bruising.   Patient drinks 3 beers a day.  Sleep apnea, patient reports being compliant with CPAP machine  INTERVAL HISTORY Christian Cole is a 70 y.o. male who has above history reviewed by me today presents for follow up visit for management of erythrocytosis Patient has underwent phlebotomy sessions and tolerates well.  Patient has no new complaints. . Review of Systems  Constitutional:  Negative for appetite change, chills, fatigue, fever and unexpected weight change.  HENT:   Negative for hearing loss and voice change.   Eyes:  Negative for eye problems and icterus.  Respiratory:  Negative for chest tightness, cough and shortness of breath.   Cardiovascular:  Negative for chest pain and leg swelling.  Gastrointestinal:  Negative  for abdominal distention and abdominal pain.  Endocrine: Negative for hot flashes.  Genitourinary:  Negative for difficulty urinating, dysuria and frequency.   Musculoskeletal:  Negative for arthralgias.  Skin:  Negative for itching and rash.  Neurological:  Negative for light-headedness and numbness.  Hematological:  Negative for adenopathy. Does not bruise/bleed easily.  Psychiatric/Behavioral:  Negative for confusion.    MEDICAL HISTORY:  Past Medical History:  Diagnosis Date   CAD (coronary artery disease)    a. s/p CABG in 2000 with LIMA-LAD, RIMA-PDA, SVG-D1, and SVG-PLA b. low-risk NST in 2016   Cancer [facility])    skin cancer follows Dr. Kellie Moor    ED (erectile dysfunction)    GERD (gastroesophageal reflux disease)    Hyperlipidemia    Hypertension    Followed by Dr. Claiborne Billings   OSA on CPAP    Sinus bradycardia    Thrombocytopenia (HCC)    Tongue lesion    benign from trauma as of 06/20/21    SURGICAL HISTORY: Past Surgical History:  Procedure Laterality Date   CATARACT EXTRACTION, BILATERAL  2010   COLONOSCOPY WITH PROPOFOL N/A 04/15/2020   Procedure: COLONOSCOPY WITH PROPOFOL;  Surgeon: Robert Bellow, MD;  Location: [facility] ENDOSCOPY;  Service: Endoscopy;  Laterality: N/A;   CORONARY ARTERY BYPASS GRAFT  2000   LIMA to the LAD,RIMA to the PDA,vein graft to the diagonal & vein graft to the PLA   ESOPHAGOGASTRODUODENOSCOPY     Dr. Gustavo Lah   ESOPHAGOGASTRODUODENOSCOPY  04/15/2020   Procedure: ESOPHAGOGASTRODUODENOSCOPY (EGD);  Surgeon: Robert Bellow, MD;  Location: [facility] ENDOSCOPY;  Service: Endoscopy;;   KNEE ARTHROSCOPY  [place]   Fluid flush   SHOULDER ARTHROSCOPY  2011   SHOULDER SURGERY  2010   bone  spur    US ECHOCARDIOGRAPHY  12/12/2010   mild LA dilatation, normal LV systolic fx, EF > 32%    SOCIAL HISTORY: Social History   Socioeconomic History   Marital status: Married    Spouse name: Not on file   Number of children: 3   Years  of education: 16   Highest education level: Not on file  Occupational History   Occupation: Retired Corporate treasurer   Occupation: Management/Technical Support    Comment: Psychiatric nurse  Tobacco Use   Smoking status: Every Day    Packs/day: 0.50    Years: 44.00    Pack years: 22.00    Types: Cigarettes   Smokeless tobacco: Never   Tobacco comments:    on and off smoker   Vaping Use   Vaping Use: Never used  Substance and Sexual Activity   Alcohol use: Yes    Alcohol/week: 10.0 standard drinks    Types: 10 Cans of beer per week    Comment: Down to 3 beers a day from 4-6 a day    Drug use: No   Sexual activity: Yes    Partners: Female    Comment: Wife  Other Topics Concern   Not on file  Social History Narrative   Fintan grew up in [place]. He is currently living in [place] with his wife. This is his second marriage. He has 1 daughter and 2 sons from his first marriage. He has 4 step kids. His daughter lives in [place], one son in [place], [place] and the other son in [place], [place]. 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    He enjoys wood working on his spare time. 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       He retired 04/2019 and enjoys this       2 brothers and 2 sisters he is next to youngest    Social Determinants of Radio broadcast assistant Strain: Not on file  Food Insecurity: Not on file  Transportation Needs: Not on file  Physical Activity: Not on file  Stress: Not on file  Social Connections: Not on file  Intimate Partner Violence: Not on file    FAMILY HISTORY: Family History  Problem Relation Age of Onset   Arthritis Mother    Heart disease Father        CAD - MI   Stroke Father    Hypertension Father    Cancer Father 35       esophageal cancer    Arthritis Maternal Grandmother      ALLERGIES:  has No Known Allergies.  MEDICATIONS:  Current Outpatient Medications  Medication Sig Dispense Refill   amLODipine (NORVASC) 2.5 MG tablet Take 1 tablet (2.5 mg total) by mouth daily. +5 mg daily=7.5 mg daily total 90 tablet 3   amLODipine (NORVASC) 5 MG tablet Take 1 tablet (5 mg total) by mouth daily. 90 tablet 3   atorvastatin (LIPITOR) 40 MG tablet Take 1 tablet (40 mg total) by mouth daily. At night 90 tablet 3   benazepril (LOTENSIN) 40 MG tablet Take 1 tablet (40 mg total) by mouth daily. 90 tablet 3   clopidogrel (PLAVIX) 75 MG tablet Take 1 tablet (75 mg total) by mouth daily. 90 tablet 2   ezetimibe (ZETIA) 10 MG tablet Take  1 tablet (10 mg total) by mouth daily. 90 tablet 3   NEEDLE, DISP, 18 G (BD SAFETYGLIDE NEEDLE) 18G X 1-1/2" MISC Draw up medication with this needle 30 each 0   pantoprazole (PROTONIX) 40 MG tablet TAKE 1 TABLET BY MOUTH EVERY DAY 30 MINUTES BEFORE A MEAL 90 tablet 3   silodosin (RAPAFLO) 8 MG CAPS capsule Take 1 capsule (8 mg total) by mouth daily with breakfast. 30 capsule 3   SYRINGE-NEEDLE, DISP, 3 ML 23G X 1-1/2" 3 ML MISC Use as directed QOWEEK 30 each 5   testosterone cypionate (DEPOTESTOSTERONE CYPIONATE) 200 MG/ML injection ADMINISTER 1.5 ML(300 MG) IN THE MUSCLE EVERY 14 DAYS 10 mL 0   No current facility-administered medications for this visit.     PHYSICAL EXAMINATION: ECOG PERFORMANCE STATUS: 0 - Asymptomatic Vitals:   08/30/21 1315  BP: (!) 164/83  Pulse: 86  Temp: 97.6 F (36.4 C)   Filed Weights   08/30/21 1315  Weight: 216 lb 4.8 oz (98.1 kg)    Physical Exam Constitutional:      General: He is not in acute distress. HENT:     Head: Normocephalic and atraumatic.  Eyes:     General: No scleral icterus. Cardiovascular:     Rate and Rhythm: Normal rate and regular rhythm.     Heart sounds: Normal heart sounds.  Pulmonary:     Effort: Pulmonary effort is normal. No respiratory distress.     Breath sounds: No  wheezing.  Abdominal:     General: Bowel sounds are normal. There is no distension.     Palpations: Abdomen is soft.  Musculoskeletal:        General: No deformity. Normal range of motion.     Cervical back: Normal range of motion and neck supple.  Skin:    General: Skin is warm and dry.     Findings: No erythema or rash.  Neurological:     Mental Status: He is alert and oriented to person, place, and time. Mental status is at baseline.     Cranial Nerves: No cranial nerve deficit.     Coordination: Coordination normal.  Psychiatric:        Mood and Affect: Mood normal.     LABORATORY DATA:  I have reviewed the data as listed Lab Results  Component Value Date   WBC 7.5 08/30/2021   HGB 16.1 08/30/2021   HCT 48.2 08/30/2021   MCV 94.1 08/30/2021   PLT 129 (L) 08/30/2021   Recent Labs    06/07/21 1329 08/08/21 2040 08/30/21 1244  NA 138 140 137  K 4.1 4.2 4.2  CL 108 104 105  CO2 25 26 28   GLUCOSE 132* 92 118*  BUN 15 11 12   CREATININE 1.11 0.97 1.06  CALCIUM 8.6* 9.3 8.6*  GFRNONAA >60 >60 >60  PROT 6.7 7.4 7.1  ALBUMIN 4.2 4.5 4.1  AST 25 22 25   ALT 22 22 23   ALKPHOS 69 78 69  BILITOT 0.8 1.0 1.0    Iron/TIBC/Ferritin/ %Sat No results found for: IRON, TIBC, FERRITIN, IRONPCTSAT    RADIOGRAPHIC STUDIES: I have personally reviewed the radiological images as listed and agreed with the findings in the report.  DG Chest 2 View  Result Date: 08/08/2021 CLINICAL DATA:  Chest pain EXAM: CHEST - 2 VIEW COMPARISON:  Chest x-ray 12/26/2005, CT chest 01/11/2021 FINDINGS: Vascular clips overlie the mediastinum. The heart and mediastinal contours are unchanged. No focal consolidation. No pulmonary edema. No pleural effusion. No  pneumothorax. No acute osseous abnormality. IMPRESSION: No active cardiopulmonary disease. Electronically Signed   By: Iven Finn M.D.   On: 08/08/2021 20:38     ASSESSMENT & PLAN:  1. Secondary erythrocytosis   2. Long-term current use of  testosterone replacement therapy   3. Thrombocytopenia (HCC)    #Thrombocytopenia, likely ITP Previous work-up showed normal folate and vitamin B12 level, negative flowcytometry and monoclonal gammopathy workup.  Hepatitis B surface antigen tested negative in 2019.  Hep C negative Labs reviewed with patient.  Platelet count is stable at baseline.  Monitor.   #Secondary erythrocytosis, likely secondary due to testosterone replacement therapy. Hematocrit is less than 50. Repeat H&H in 12 weeks +/- phlebotomy.  Follow-up in 24 weeks, labs CBC CMP +/- phlebotomy MD   All questions were answered. The patient knows to call the clinic with any problems questions or concerns.  Cc [name], [name] *   Thank you for this kind referral and the opportunity to participate in the care of this patient. A copy of today's note is routed to referring provider    Earlie Server, MD, PhD 08/30/2021

## 2021-09-04 ENCOUNTER — Other Ambulatory Visit: Payer: Self-pay

## 2021-09-04 ENCOUNTER — Encounter: Payer: Self-pay | Admitting: Cardiovascular Disease

## 2021-09-04 ENCOUNTER — Ambulatory Visit (INDEPENDENT_AMBULATORY_CARE_PROVIDER_SITE_OTHER): Payer: Medicare Other | Admitting: Cardiovascular Disease

## 2021-09-04 VITALS — BP 152/68 | HR 55 | Ht 71.0 in | Wt 217.6 lb

## 2021-09-04 DIAGNOSIS — Z951 Presence of aortocoronary bypass graft: Secondary | ICD-10-CM

## 2021-09-04 DIAGNOSIS — G4733 Obstructive sleep apnea (adult) (pediatric): Secondary | ICD-10-CM

## 2021-09-04 DIAGNOSIS — Z720 Tobacco use: Secondary | ICD-10-CM

## 2021-09-04 DIAGNOSIS — I10 Essential (primary) hypertension: Secondary | ICD-10-CM

## 2021-09-04 DIAGNOSIS — I2510 Atherosclerotic heart disease of native coronary artery without angina pectoris: Secondary | ICD-10-CM | POA: Diagnosis not present

## 2021-09-04 DIAGNOSIS — Z9989 Dependence on other enabling machines and devices: Secondary | ICD-10-CM

## 2021-09-04 DIAGNOSIS — R0789 Other chest pain: Secondary | ICD-10-CM | POA: Diagnosis not present

## 2021-09-04 MED ORDER — AMLODIPINE BESYLATE 10 MG PO TABS: 10.0000 mg | ORAL_TABLET | Freq: Every day | ORAL | 3 refills | Status: DC

## 2021-09-04 NOTE — Progress Notes (Signed)
Patient ID: Christian Cole, male   DOB: July 23, 1951, 70 y.o.   MRN: [id number]     HPI: Christian Cole is a 70 y.o. male the presents for an 8 month cardiology evaluation.  Christian Cole has known CAD and underwent initial percutaneous coronary intervention in 1998 of an unknown vessel. He underwent CABG revascularization surgery in December 2000 with LIMA to LAD, LIMA to the PDA, vein to the diagonal, and then to the PLA. 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  An echo Doppler study in January 2002 showed normal systolic function. His mild TR and mild Christian. A nuclear perfusion study in March 2013 showed normal perfusion without scar or ischemia.  When I saw him in 2016 he had continued to do well and remained active and denied any chest pain.   He he had seen Cecilie Kicks in March 2016 and she had reduced his metoprolol to 12.5 mg twice a day due to asymptomatic bradycardia.  He had blood work in December 2015 by his primary care physician and his total cholesterol was 166, HDL 32, LDL 104, VLDL 29 and triglycerides 149.  He has been maintained on atorvastatin 40 mg, Plavix 75 mg, pantoprazole 40 mg, as well as Viagra as needed.  He underwent a nuclear perfusion study on April 26, 2015.  He remained asymptomatic.  He developed asymptomatic ST segment depression during stress.  Scintigraphic images were entirely normal.  He had normal function.  Ejection fraction was 52%.  He presents for evaluation.  When I saw him in October 2017 he was doing well and was without chest pain.  He was exercising daily.  He had lost over 40 pounds in the 8 months prior to that evaluation.  His peak weight was 242 and his weight this morning without clothes at home was 195.  He continues to use CPAP with 100% compliance.   He was evaluated by Bernerd Pho in October 2018.   His blood pressure was elevated and amlodipine 5 mg was added to his benazepril and Lopressor.  He had resumed smoking and at that time was given a prescription for Chantix.  I saw him in December 2019 and prior to that evaluation he had  received a new CPAP machine which was prescribed by physician in [place].  He was sleeping well and cannot sleep without his CPAP.  Unfortunately he was still smoking 1/2 pack of cigarettes per day.  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  He walks at least 2 miles.  He denies any chest pain PND orthopnea.   He underwent carotid Dopplers in June 2020 which did not reveal any significant internal carotid stenoses in the 1 to 39% range.  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  A Myoview study in July 2020 showed EF 55 to 60% without scar or ischemia.  He was seen by Almyra Deforest, PA for preoperative clearance and underwent successful inflatable penile prosthesis by Dr. Meta Hatchet at [facility] urology.  He tolerated surgery without cardiovascular compromise.  I saw him in February 2021 at which time he denied any anginal symptomatology.  Unfortunately he was still smoking.  He continued to be compliant with CPAP therapy and "cannot sleep without it."  I last saw him on January 09, 2021.  He remained stable and denied any chest pain or  shortness of breath but unfortunately was still smoking.   He has been working around the house remodeling his bathroom.  He has been on amlodipine 7.5 mg, benazepril 40 mg for hypertension, atorvastatin 40 mg and Zetia for hyperlipidemia, and is on testosterone injections every 14 days with continued low testosterone levels.  He continues to use CPAP with 100% compliance.    Since I last saw him, was evaluated at [facility] ER and presented with a warm sensation to the left side of his chest radiating to his neck.  It was not exertionally precipitated.  A chest x-ray did not  reveal any active cardiopulmonary disease.  His ECG did not show any ischemia.  Troponins were negative.  Presently he states he started to notice the symptoms when he was started on Flomax and he was felt to have possible sensitivity to this.  He is now on silodosin (Rapaflo) and is tolerating this well.  He continues to be on amlodipine 7.5 mg daily and benazepril 40 mg for hypertension.  He is on atorvastatin 40 mg and Zetia 10 mg for hyperlipidemia.  He is on testosterone supplementation every 2 weeks.  He continues to use CPAP therapy with [employer] as his DME company in [place].  A download was obtained from September 22 through September 01, 2021 which shows excellent compliance with average use at 8 hours and 16 minutes per night.  He has been using a Respironics DreamWear mask.  At 13 cm set pressure, AHI is 5.3.  He is still smoking at least a half a pack of cigarettes per day and his wife also smokes.  He has some discomfort from bilateral inguinal hernias for which he wears a truss.  He presents for evaluation.   Past Medical History:  Diagnosis Date   CAD (coronary artery disease)    a. s/p CABG in 2000 with LIMA-LAD, RIMA-PDA, SVG-D1, and SVG-PLA b. low-risk NST in 2016   Cancer [facility])    skin cancer follows Dr. Kellie Moor    ED (erectile dysfunction)    GERD (gastroesophageal reflux disease)    Hyperlipidemia    Hypertension    Followed by Dr. Claiborne Billings   OSA on CPAP    Sinus bradycardia    Thrombocytopenia (HCC)    Tongue lesion    benign from trauma as of 06/20/21    Past Surgical History:  Procedure Laterality Date   CATARACT EXTRACTION, BILATERAL  2010   COLONOSCOPY WITH PROPOFOL N/A 04/15/2020   Procedure: COLONOSCOPY WITH PROPOFOL;  Surgeon: Robert Bellow, MD;  Location: [facility] ENDOSCOPY;  Service: Endoscopy;  Laterality: N/A;   CORONARY ARTERY BYPASS GRAFT  2000   LIMA to the LAD,RIMA to the PDA,vein graft to the diagonal & vein graft to the PLA   ESOPHAGOGASTRODUODENOSCOPY      Dr. Gustavo Lah   ESOPHAGOGASTRODUODENOSCOPY  04/15/2020   Procedure: ESOPHAGOGASTRODUODENOSCOPY (EGD);  Surgeon: Robert Bellow, MD;  Location: [facility] ENDOSCOPY;  Service: Endoscopy;;   KNEE ARTHROSCOPY  [place]   Fluid flush   SHOULDER ARTHROSCOPY  2011   SHOULDER SURGERY  2010   bone spur    US ECHOCARDIOGRAPHY  12/12/2010   mild LA dilatation, normal LV systolic fx, EF > 49%    Allergies  Allergen Reactions   Tamsulosin Other (See Comments)    Chest discomfort, basal dilation with a warm feeling in chest     Current Outpatient Medications  Medication Sig Dispense Refill   atorvastatin (LIPITOR) 40  MG tablet Take 1 tablet (40 mg total) by mouth daily. At night 90 tablet 3   benazepril (LOTENSIN) 40 MG tablet Take 1 tablet (40 mg total) by mouth daily. 90 tablet 3   clopidogrel (PLAVIX) 75 MG tablet Take 1 tablet (75 mg total) by mouth daily. 90 tablet 2   ezetimibe (ZETIA) 10 MG tablet Take 1 tablet (10 mg total) by mouth daily. 90 tablet 3   NEEDLE, DISP, 18 G (BD SAFETYGLIDE NEEDLE) 18G X 1-1/2" MISC Draw up medication with this needle 30 each 0   pantoprazole (PROTONIX) 40 MG tablet TAKE 1 TABLET BY MOUTH EVERY DAY 30 MINUTES BEFORE A MEAL 90 tablet 3   silodosin (RAPAFLO) 8 MG CAPS capsule Take 1 capsule (8 mg total) by mouth daily with breakfast. 30 capsule 3   SYRINGE-NEEDLE, DISP, 3 ML 23G X 1-1/2" 3 ML MISC Use as directed QOWEEK 30 each 5   testosterone cypionate (DEPOTESTOSTERONE CYPIONATE) 200 MG/ML injection ADMINISTER 1.5 ML(300 MG) IN THE MUSCLE EVERY 14 DAYS 10 mL 0   amLODipine (NORVASC) 10 MG tablet Take 1 tablet (10 mg total) by mouth daily. 90 tablet 3   No current facility-administered medications for this visit.    Social History   Socioeconomic History   Marital status: Married    Spouse name: Not on file   Number of children: 3   Years of education: 16   Highest education level: Not on file  Occupational History    Occupation: Retired Corporate treasurer   Occupation: Management/Technical Support    Comment: Psychiatric nurse  Tobacco Use   Smoking status: Every Day    Packs/day: 0.50    Years: 44.00    Pack years: 22.00    Types: Cigarettes   Smokeless tobacco: Never   Tobacco comments:    on and off smoker   Vaping Use   Vaping Use: Never used  Substance and Sexual Activity   Alcohol use: Yes    Alcohol/week: 10.0 standard drinks    Types: 10 Cans of beer per week    Comment: Down to 3 beers a day from 4-6 a day    Drug use: No   Sexual activity: Yes    Partners: Female    Comment: Wife  Other Topics Concern   Not on file  Social History Narrative   Maruice grew up in [place]. He is currently living in [place] with his wife. This is his second marriage. He has 1 daughter and 2 sons from his first marriage. He has 4 step kids. His daughter lives in [place], one son in [place], [place] and the other son in [place], [place]. 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    He enjoys wood working on his spare time. 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       He retired 04/2019 and enjoys this       2 brothers and 2 sisters he is next to youngest    Social Determinants of Radio broadcast assistant Strain: Not on file  Food Insecurity: Not on file  Transportation Needs: Not on file  Physical Activity: Not on file  Stress: Not on file  Social Connections: Not on file  Intimate Partner Violence: Not on file    Family History  Problem Relation Age  of Onset   Arthritis Mother    Heart disease Father        CAD - MI   Stroke Father    Hypertension Father    Cancer Father 59       esophageal cancer    Arthritis Maternal Grandmother     ROS General: Negative; No fevers, chills, or night sweats;  HEENT: Negative; No changes in vision or  hearing, sinus congestion, difficulty swallowing Pulmonary: Negative; No cough, wheezing, shortness of breath, hemoptysis Cardiovascular: See history of present illness GI: Negative; No nausea, vomiting, diarrhea, or abdominal pain GU: Low testosterone no dysuria, hematuria, or difficulty voiding Musculoskeletal: Negative; no myalgias, joint pain, or weakness Hematologic/Oncology: Negative; no easy bruising, bleeding Endocrine: Negative; no heat/cold intolerance; no diabetes Neuro: Negative; no changes in balance, headaches Skin: Negative; No rashes or skin lesions Psychiatric: Negative; No behavioral problems, depression Sleep: Positive for OSA on CPAP therapy.  No snoring, daytime sleepiness, hypersomnolence, bruxism, restless legs, hypnogognic hallucinations, no cataplexy Other comprehensive 14 point system review is negative.  PE BP (!) 152/68 (BP Location: Left Arm, Patient Position: Sitting, Cuff Size: Large)   Pulse (!) 55   Ht $R'5\' 11"'pL$  (1.803 m)   Wt 217 lb 9.6 oz (98.7 kg)   BMI 30.35 kg/m    Repeat blood pressure by me was elevated at 160/74  Wt Readings from Last 3 Encounters:  09/08/21 217 lb (98.4 kg)  09/04/21 217 lb 9.6 oz (98.7 kg)  08/30/21 216 lb 4.8 oz (98.1 kg)   General: Alert, oriented, no distress.  Skin: normal turgor, no rashes, warm and dry HEENT: Normocephalic, atraumatic. Pupils equal round and reactive to light; sclera anicteric; extraocular muscles intact;  Nose without nasal septal hypertrophy Mouth/Parynx benign; Mallinpatti scale 3 Neck: No JVD, no carotid bruits; normal carotid upstroke Lungs: clear to ausculatation and percussion; no wheezing or rales Chest wall: without tenderness to palpitation Heart: PMI not displaced, RRR, s1 s2 normal, 1/6 systolic murmur, no diastolic murmur, no rubs, gallops, thrills, or heaves Abdomen: soft, nontender; no hepatosplenomehaly, BS+; abdominal aorta nontender and not dilated by palpation. Bilateral inguinal  hernias, wearing a truss Back: no CVA tenderness Pulses 2+ Musculoskeletal: full range of motion, normal strength, no joint deformities Extremities: no clubbing cyanosis or edema, Homan's sign negative  Neurologic: grossly nonfocal; Cranial nerves grossly wnl Psychologic: Normal mood and affect  September 04, 2021 ECG (independently read by me): Sinus bradycardia 55, PR 210 msec, inferior T wave changes and Q wave III, aVF,   January 09, 2021 ECG (independently read by me): Sinus bradycardia at 53; 1st degree block, PR 222 msec, borderline LVH  February 2021 ECG (independently read by me): Sinus bradycardia at 58 bpm, first-degree AV block with a PR interval at 244 ms.  No significant ST-T changes.  QTc interval 431 ms  December 2019 ECG (independently read by me): Sinus bradycardia at 56 bpm.  First-degree block with PR interval 264 ms.  QTc interval 443 ms  October 2017 ECG (independently read by me): Sinus bradycardia at 44 bpm with first degree AV block.  Q wave in leads III and F.  PR interval 224 ms.  June 2016 ECG (independently read by me): not done today due to recent treadmill nuclear study  ECG (independently read by me): Sinus bradycardia at 45 beats per minute. Normal intervals. Q waves in lead 3 and small Q wave in aVF.  LABS: BMP Latest Ref Rng & Units 08/30/2021 08/08/2021 06/07/2021  Glucose  70 - 99 mg/dL 118(H) 92 132(H)  BUN 8 - 23 mg/dL $Remove'12 11 15  'LSUSJyq$ Creatinine 0.61 - 1.24 mg/dL 1.06 0.97 1.11  BUN/Creat Ratio 10 - 24 - - -  Sodium 135 - 145 mmol/L 137 140 138  Potassium 3.5 - 5.1 mmol/L 4.2 4.2 4.1  Chloride 98 - 111 mmol/L 105 104 108  CO2 22 - 32 mmol/L $RemoveB'28 26 25  'dPGGereN$ Calcium 8.9 - 10.3 mg/dL 8.6(L) 9.3 8.6(L)   Hepatic Function Latest Ref Rng & Units 08/30/2021 08/08/2021 06/07/2021  Total Protein 6.5 - 8.1 g/dL 7.1 7.4 6.7  Albumin 3.5 - 5.0 g/dL 4.1 4.5 4.2  AST 15 - 41 U/L $Remo'25 22 25  'ZpVbI$ ALT 0 - 44 U/L $Remo'23 22 22  'HeMAs$ Alk Phosphatase 38 - 126 U/L 69 78 69  Total Bilirubin  0.3 - 1.2 mg/dL 1.0 1.0 0.8   CBC Latest Ref Rng & Units 08/30/2021 08/08/2021 07/21/2021  WBC 4.0 - 10.5 K/uL 7.5 9.1 -  Hemoglobin 13.0 - 17.0 g/dL 16.1 16.3 15.9  Hematocrit 39.0 - 52.0 % 48.2 47.5 47.6  Platelets 150 - 400 K/uL 129(L) 155 -   Lab Results  Component Value Date   MCV 94.1 08/30/2021   MCV 94.6 08/08/2021   MCV 95.8 06/07/2021   Lab Results  Component Value Date   TSH 1.60 12/21/2020   Lab Results  Component Value Date   HGBA1C 5.9 06/20/2021    Lipid Panel     Component Value Date/Time   CHOL 123 06/20/2021 1030   CHOL 167 12/17/2019 1113   TRIG 77.0 06/20/2021 1030   HDL 57.50 06/20/2021 1030   HDL 86 12/17/2019 1113   CHOLHDL 2 06/20/2021 1030   VLDL 15.4 06/20/2021 1030   LDLCALC 50 06/20/2021 1030   LDLCALC 65 12/17/2019 1113   LDLDIRECT 91.6 01/06/2014 1443    RADIOLOGY: No results found.  IMPRESSION: 1. CAD in native artery   2. Hx of CABG   3. Atypical chest pain   4. Essential hypertension   5. OSA on CPAP   6. Tobacco use      ASSESSMENT AND PLAN: Christian. Christian Cole is a 70  year-old gentleman who has a history of CAD and underwent initial intervention in 1998 and due to progressive CAD underwent CABG surgery in 2000. A nuclear perfusion study in 2013 remained normal without scar or ischemia.  A three-year follow-up nuclear perfusion study on 04/26/2015 continued to show entirely normal perfusion without scar or ischemia.  However, he developed asymptomatic ST depression during stress.  For this reason, it was interpreted as intermediate study.  Post-rest ejection fraction was 52%.  A repeat Myoview in July 2020 EF 55 to 60% was without ischemia or scar.  He underwent successful penile implant surgery without cardiovascular compromise.  He recently was evaluated in the emergency room after he had developed a warm sensation on his chest.  He believes symptoms began once he initiated therapy with Flomax.  He was not felt to have ischemic  changes on ECG and troponin was negative.  His symptoms resolved and he is now on Rapaflo for his urinary issues.  Unfortunately he still smokes cigarettes and currently has been smoking half pack per day.  His blood pressure is elevated and I have recommended titration of his amlodipine from 7.5 mg up to 10 mg daily and he will continue benazepril 40 mg daily with target blood pressure less than 130/80.  He continues to be on clopidogrel for  antiplatelet therapy.  He is on atorvastatin 40 mg and Zetia 10 mg for hyperlipidemia.  LDL cholesterol in August 9 , 2022 was excellent at 50.  He does have bilateral inguinal hernia for which he wears a truss.  With his ongoing tobacco history, and very chest pain symptomatology I have recommended he undergo a follow-up Lexiscan Myoview study to be done in December 2022 which will be 2-1/2 years since his last evaluation.  He continues to use CPAP with excellent compliance and "cannot sleep without it."  I will contact his DME to increase his set pressure to 14 cm of water.  I will see him in the office in follow-up in January 2023 and further recommendations made at that time.    Troy Sine, MD, [facility]  09/10/2021 6:50 PM

## 2021-09-04 NOTE — Patient Instructions (Signed)
Medication Instructions:  Take amlodipine 10 mg once a day *If you need a refill on your cardiac medications before your next appointment, please call your pharmacy*   Testing/Procedures: Your physician has requested that you have a lexiscan myoview. For further information please visit [email or web address]. Please follow instruction sheet, as given. [place]   Follow-Up: At [facility], you and your health needs are our priority.  As part of our continuing mission to provide you with exceptional heart care, we have created designated Provider Care Teams.  These Care Teams include your primary Cardiologist (physician) and Advanced Practice Providers (APPs -  Physician Assistants and Nurse Practitioners) who all work together to provide you with the care you need, when you need it.  We recommend signing up for the patient portal called "MyChart".  Sign up information is provided on this After Visit Summary.  MyChart is used to connect with patients for Virtual Visits (Telemedicine).  Patients are able to view lab/test results, encounter notes, upcoming appointments, etc.  Non-urgent messages can be sent to your provider as well.   To learn more about what you can do with MyChart, go to [email or web address].    Your next appointment:   3 month(s)  The format for your next appointment:   In Person  Provider:   Shelva Majestic, MD   Other Instructions Increased your CPAP pressure to 14.

## 2021-09-08 ENCOUNTER — Encounter: Payer: Self-pay | Admitting: Urology

## 2021-09-08 ENCOUNTER — Other Ambulatory Visit: Payer: Self-pay

## 2021-09-08 ENCOUNTER — Ambulatory Visit (INDEPENDENT_AMBULATORY_CARE_PROVIDER_SITE_OTHER): Payer: Medicare Other | Admitting: Urology

## 2021-09-08 VITALS — BP 127/70 | HR 60 | Ht 71.0 in | Wt 217.0 lb

## 2021-09-08 DIAGNOSIS — R3912 Poor urinary stream: Secondary | ICD-10-CM | POA: Diagnosis not present

## 2021-09-08 LAB — BLADDER SCAN AMB NON-IMAGING: Scan Result: 256

## 2021-09-08 NOTE — Progress Notes (Signed)
09/08/2021 10:45 AM   Christian Cole 1951-01-19 [id number]  Referring provider: [name], Nino Glow, MD [place],  [place] [id number]  Chief Complaint  Patient presents with   Benign Prostatic Hypertrophy    Urologic history: 1.  Hypogonadism TRT testosterone cypionate   2.  Erectile dysfunction Penile prosthesis; [facility] 06/2019  HPI: 70 y.o. male presents for 1 month follow-up  Acute visit 07/31/2021 complaining of a 19-month history of progressive urinary hesitancy and decreased force and caliber of his urinary stream Bladder scan PVR 325 mL Started tamsulosin 0.4 mg daily Since his last visit he noted significant improvement in his voiding symptoms with tamsulosin which occurred within several days He was seen in ED 9/28 complaining of a warm sensation across his chest.  In cardiology follow-up this was felt to be due to vasodilation secondary to tamsulosin.  The tamsulosin was discontinued with resolution of the symptoms.  He has been on silodosin for ~ 3 weeks and does note symptom improvement but does not feel it has been as effective as the tamsulosin Bladder scan PVR today was 256 mL   PMH: Past Medical History:  Diagnosis Date   CAD (coronary artery disease)    a. s/p CABG in 2000 with LIMA-LAD, RIMA-PDA, SVG-D1, and SVG-PLA b. low-risk NST in 2016   Cancer [facility])    skin cancer follows Dr. Kellie Moor    ED (erectile dysfunction)    GERD (gastroesophageal reflux disease)    Hyperlipidemia    Hypertension    Followed by Dr. Claiborne Billings   OSA on CPAP    Sinus bradycardia    Thrombocytopenia ([place])    Tongue lesion    benign from trauma as of 06/20/21    Surgical History: Past Surgical History:  Procedure Laterality Date   CATARACT EXTRACTION, BILATERAL  2010   COLONOSCOPY WITH PROPOFOL N/A 04/15/2020   Procedure: COLONOSCOPY WITH PROPOFOL;  Surgeon: Robert Bellow, MD;  Location: [facility] ENDOSCOPY;  Service: Endoscopy;  Laterality: N/A;   CORONARY  ARTERY BYPASS GRAFT  2000   LIMA to the LAD,RIMA to the PDA,vein graft to the diagonal & vein graft to the PLA   ESOPHAGOGASTRODUODENOSCOPY     Dr. Gustavo Lah   ESOPHAGOGASTRODUODENOSCOPY  04/15/2020   Procedure: ESOPHAGOGASTRODUODENOSCOPY (EGD);  Surgeon: Robert Bellow, MD;  [facility] ENDOSCOPY;  Service: Endoscopy;;   KNEE ARTHROSCOPY  [place]   Fluid flush   SHOULDER ARTHROSCOPY  2011   SHOULDER SURGERY  2010   bone spur    US ECHOCARDIOGRAPHY  12/12/2010   mild LA dilatation, normal LV systolic fx, EF > 38%    Home Medications:  Allergies as of 09/08/2021       Reactions   Tamsulosin Other (See Comments)   Chest discomfort, basal dilation with a warm feeling in chest         Medication List        Accurate as of September 08, 2021 10:45 AM. If you have any questions, ask your nurse or doctor.          amLODipine 10 MG tablet Commonly known as: NORVASC Take 1 tablet (10 mg total) by mouth daily.   atorvastatin 40 MG tablet Commonly known as: LIPITOR Take 1 tablet (40 mg total) by mouth daily. At night   BD SafetyGlide Needle 18G X 1-1/2" Misc Generic drug: NEEDLE (DISP) 18 G Draw up medication with this needle   benazepril 40 MG tablet Commonly known  as: LOTENSIN Take 1 tablet (40 mg total) by mouth daily.   clopidogrel 75 MG tablet Commonly known as: PLAVIX Take 1 tablet (75 mg total) by mouth daily.   ezetimibe 10 MG tablet Commonly known as: ZETIA Take 1 tablet (10 mg total) by mouth daily.   pantoprazole 40 MG tablet Commonly known as: PROTONIX TAKE 1 TABLET BY MOUTH EVERY DAY 30 MINUTES BEFORE A MEAL   silodosin 8 MG Caps capsule Commonly known as: RAPAFLO Take 1 capsule (8 mg total) by mouth daily with breakfast.   SYRINGE-NEEDLE (DISP) 3 ML 23G X 1-1/2" 3 ML Misc Use as directed QOWEEK   testosterone cypionate 200 MG/ML injection Commonly known as: DEPOTESTOSTERONE CYPIONATE ADMINISTER 1.5 ML(300 MG) IN THE  MUSCLE EVERY 14 DAYS        Allergies:  Allergies  Allergen Reactions   Tamsulosin Other (See Comments)    Chest discomfort, basal dilation with a warm feeling in chest     Family History: Family History  Problem Relation Age of Onset   Arthritis Mother    Heart disease Father        CAD - MI   Stroke Father    Hypertension Father    Cancer Father 61       esophageal cancer    Arthritis Maternal Grandmother     Social History:  reports that he has been smoking cigarettes. He has a 22.00 pack-year smoking history. He has never used smokeless tobacco. He reports current alcohol use of about 10.0 standard drinks per week. He reports that he does not use drugs.   Physical Exam: BP 127/70   Pulse 60   Ht 5\' 11"  (1.803 m)   Wt 217 lb (98.4 kg)   BMI 30.27 kg/m   Constitutional:  Alert and oriented, No acute distress. HEENT: [place] AT, moist mucus membranes.  Trachea midline, no masses. Cardiovascular: No clubbing, cyanosis, or edema. Respiratory: Normal respiratory effort, no increased work of breathing.   Assessment & Plan:    1.  BPH with LUTS Moderate PVR which has improved from last visit Continue silodosin 6 week follow-up with repeat PVR Outlet procedures were discussed including UroLift and TURP.  He may be interested in UroLift and will discuss further on follow-up  2.  Hypogonadism Review monitoring labs on follow-up   Abbie Sons, MD  [place] [address], [place], [place] [address] [phone]

## 2021-09-10 ENCOUNTER — Encounter: Payer: Self-pay | Admitting: Cardiovascular Disease

## 2021-09-12 ENCOUNTER — Other Ambulatory Visit: Payer: Self-pay

## 2021-09-13 ENCOUNTER — Other Ambulatory Visit: Payer: TRICARE For Life (TFL)

## 2021-09-14 ENCOUNTER — Encounter: Payer: Self-pay | Admitting: Urology

## 2021-09-19 ENCOUNTER — Telehealth: Payer: Self-pay | Admitting: Urology

## 2021-09-19 ENCOUNTER — Other Ambulatory Visit: Payer: Self-pay | Admitting: Urology

## 2021-09-19 NOTE — Telephone Encounter (Signed)
Patient had sent a MyChart message desiring to be scheduled for UroLift.  I had responded that the schedulers would call him to get this set up however on chart review he has not had a cystoscopy or TRUS.  Please let him know we need to get this scheduled to make sure he is a candidate for the procedure.  We will get him scheduled after this is done.

## 2021-09-20 ENCOUNTER — Ambulatory Visit: Payer: Medicare Other | Admitting: Urology

## 2021-09-29 ENCOUNTER — Encounter: Payer: Self-pay | Admitting: Internal Medicine

## 2021-09-29 ENCOUNTER — Other Ambulatory Visit: Payer: Self-pay

## 2021-09-29 ENCOUNTER — Ambulatory Visit (INDEPENDENT_AMBULATORY_CARE_PROVIDER_SITE_OTHER): Payer: Medicare Other | Admitting: Urology

## 2021-09-29 VITALS — BP 114/69 | HR 62 | Ht 71.0 in | Wt 214.0 lb

## 2021-09-29 DIAGNOSIS — R3911 Hesitancy of micturition: Secondary | ICD-10-CM

## 2021-09-29 LAB — URINALYSIS, COMPLETE
Bilirubin, UA: NEGATIVE
Glucose, UA: NEGATIVE
Ketones, UA: NEGATIVE
Nitrite, UA: NEGATIVE
Protein,UA: NEGATIVE
Specific Gravity, UA: 1.01 (ref 1.005–1.030)
Urobilinogen, Ur: 0.2 mg/dL (ref 0.2–1.0)
pH, UA: 6.5 (ref 5.0–7.5)

## 2021-09-29 LAB — MICROSCOPIC EXAMINATION: WBC, UA: >30 /hpf — AB (ref 0–5)

## 2021-09-29 MED ORDER — SULFAMETHOXAZOLE-TRIMETHOPRIM 800-160 MG PO TABS: 1.0000 | ORAL_TABLET | Freq: Two times a day (BID) | ORAL | 0 refills | Status: DC

## 2021-09-29 NOTE — Progress Notes (Addendum)
70 y.o. male with bothersome LUTS interested in UroLift.  Scheduled for cystoscopy and TRUS prostate today however urinalysis was nitrite positive with significant pyuria.  He has noted his urine being cloudy with some increased odor cystoscopy was postponed and urine culture ordered.  TRUS: A transrectal ultrasound was lubricated and gently inserted per rectum.  No echogenic abnormalities of the peripheral zone were identified.  Transition zone enlargement was noted.  Width: 5.92 cm Height: 4.93 cm Length: 6.45 cm Volume: 98.6 cc  Plan: Rx Septra DS 1 p.o. twice daily x7 days sent to pharmacy pending culture result Reschedule cystoscopy

## 2021-10-03 ENCOUNTER — Other Ambulatory Visit: Payer: Self-pay | Admitting: *Deleted

## 2021-10-03 ENCOUNTER — Telehealth: Payer: Self-pay | Admitting: *Deleted

## 2021-10-03 LAB — CULTURE, URINE COMPREHENSIVE

## 2021-10-03 MED ORDER — AMOXICILLIN-POT CLAVULANATE 875-125 MG PO TABS: 1.0000 | ORAL_TABLET | Freq: Two times a day (BID) | ORAL | 0 refills | Status: AC

## 2021-10-03 NOTE — Addendum Note (Signed)
Addended by: Patria Mane A on: 10/03/2021 10:31 AM   Modules accepted: Orders

## 2021-10-03 NOTE — Telephone Encounter (Signed)
-----   Message from Abbie Sons, MD sent at 10/03/2021  3:55 PM EST ----- Urine culture grew a bacteria that was not sensitive to Septra.  Have him discontinue the Septra.  Please send in Rx amoxicillin 875 mg 1 twice daily x10 days

## 2021-10-03 NOTE — Telephone Encounter (Signed)
Notified patient as instructed, patient pleased. Discussed follow-up appointments, patient agrees  

## 2021-10-08 NOTE — Progress Notes (Signed)
   10/15/21  CC:  Chief Complaint  Patient presents with   Cysto    HPI: 70 y.o. male with bothersome LUTS interested in UroLift.  See rooming tabs for details NED. A&Ox3.   No respiratory distress   Abd soft, NT, ND Normal phallus with bilateral descended testicles  Cystoscopy Procedure Note  Patient identification was confirmed, informed consent was obtained, and patient was prepped using Betadine solution.  Lidocaine jelly was administered per urethral meatus.     Pre-Procedure: - Inspection reveals a normal caliber urethral meatus.  Procedure: The flexible cystoscope was introduced without difficulty - No urethral strictures/lesions are present. -  Prominent lateral lobe enlargement  prostate  - Elevated bladder neck - Bilateral ureteral orifices identified - Bladder mucosa  reveals no ulcers, tumors, or lesions - No bladder stones - No trabeculation  Retroflexion shows intravesical median lobe   Post-Procedure: - Patient tolerated the procedure well  Assessment/ Plan:  1.  BPH with LUTS Not a UroLift candidate secondary to prostate volume We discussed other minimally invasive options including PAE and Rezum. He is interested in HoLEP and will schedule an appointment with Dr. Diamantina Providence or Erlene Quan for further discussion   Abbie Sons, MD

## 2021-10-09 ENCOUNTER — Encounter: Payer: Self-pay | Admitting: Urology

## 2021-10-09 ENCOUNTER — Other Ambulatory Visit: Payer: Self-pay

## 2021-10-09 ENCOUNTER — Ambulatory Visit (INDEPENDENT_AMBULATORY_CARE_PROVIDER_SITE_OTHER): Payer: Medicare Other | Admitting: Urology

## 2021-10-09 VITALS — BP 126/66 | HR 62 | Ht 71.0 in | Wt 214.0 lb

## 2021-10-09 DIAGNOSIS — N138 Other obstructive and reflux uropathy: Secondary | ICD-10-CM

## 2021-10-09 DIAGNOSIS — R3911 Hesitancy of micturition: Secondary | ICD-10-CM

## 2021-10-09 LAB — MICROSCOPIC EXAMINATION: Bacteria, UA: NONE SEEN

## 2021-10-09 LAB — URINALYSIS, COMPLETE
Bilirubin, UA: NEGATIVE
Glucose, UA: NEGATIVE
Ketones, UA: NEGATIVE
Nitrite, UA: NEGATIVE
Protein,UA: NEGATIVE
Specific Gravity, UA: 1.02 (ref 1.005–1.030)
Urobilinogen, Ur: 0.2 mg/dL (ref 0.2–1.0)
pH, UA: 6 (ref 5.0–7.5)

## 2021-10-13 ENCOUNTER — Ambulatory Visit
Admission: RE | Admit: 2021-10-13 | Discharge: 2021-10-13 | Disposition: A | Discharge status: Home / Self Care | Payer: Medicare Other | Source: Ambulatory Visit | Attending: Cardiovascular Disease | Admitting: Cardiovascular Disease

## 2021-10-13 ENCOUNTER — Other Ambulatory Visit: Payer: TRICARE For Life (TFL)

## 2021-10-13 ENCOUNTER — Other Ambulatory Visit: Payer: Self-pay

## 2021-10-13 DIAGNOSIS — I2510 Atherosclerotic heart disease of native coronary artery without angina pectoris: Secondary | ICD-10-CM | POA: Insufficient documentation

## 2021-10-13 DIAGNOSIS — Z951 Presence of aortocoronary bypass graft: Secondary | ICD-10-CM | POA: Insufficient documentation

## 2021-10-13 DIAGNOSIS — R0789 Other chest pain: Secondary | ICD-10-CM | POA: Diagnosis not present

## 2021-10-13 LAB — NM MYOCAR MULTI W/SPECT W/WALL MOTION / EF
Estimated workload: 1
Exercise duration (min): 0 min
Exercise duration (sec): 0 s
LV dias vol: 159 mL (ref 62–150)
LV sys vol: 65 mL
MPHR: 149 {beats}/min
Nuc Stress EF: 59 %
Peak HR: 81 {beats}/min
Percent HR: 54 %
Rest HR: 53 {beats}/min
Rest Nuclear Isotope Dose: 10.4 mCi
SDS: 0
SRS: 0
SSS: 1
ST Depression (mm): 0 mm
Stress Nuclear Isotope Dose: 30.9 mCi
TID: 0.93

## 2021-10-13 MED ORDER — TECHNETIUM TC 99M TETROFOSMIN IV KIT
30.8900 | PACK | Freq: Once | INTRAVENOUS | Status: AC | PRN
  Administered 2021-10-13: 30.89 via INTRAVENOUS

## 2021-10-13 MED ORDER — TECHNETIUM TC 99M TETROFOSMIN IV KIT
10.0000 | PACK | Freq: Once | INTRAVENOUS | Status: AC | PRN
  Administered 2021-10-13: 10.4 via INTRAVENOUS

## 2021-10-13 MED ORDER — REGADENOSON 0.4 MG/5ML IV SOLN
0.4000 mg | Freq: Once | INTRAVENOUS | Status: AC
  Administered 2021-10-13: 0.4 mg via INTRAVENOUS

## 2021-10-15 ENCOUNTER — Encounter: Payer: Self-pay | Admitting: Urology

## 2021-10-17 NOTE — Progress Notes (Signed)
10/23/21 2:35 PM   Christian Cole December 22, 1950 [id number]  Referring provider:  [name], Nino Glow, MD [place],  [place] [address] Chief Complaint  Patient presents with   Follow-up     HPI: Christian Cole is a 70 y.o.male with a personal history of BPH with LUTS, who presents today for discussion on HoLEP.   He underwent a cystoscopy with Dr. Bernardo Heater on 10/09/2021. Retroflexion showed intravesical median lobe. His TRUS on 11/18/20221 revealed a prostate of 5.92 x 4.93 x 6.45 for a total volume of 98.6.   He has significant urinary symptoms including poor urinary stream, elevated PVRs in the 300 range.  He is not able to tolerate Flomax due to vasodilation.  He is doing well today. He reports that he has an implant by Dr.Figler in 2020.   He does have a significant cardiac history. PMH: Past Medical History:  Diagnosis Date   CAD (coronary artery disease)    a. s/p CABG in 2000 with LIMA-LAD, RIMA-PDA, SVG-D1, and SVG-PLA b. low-risk NST in 2016   Cancer [facility])    skin cancer follows Dr. Kellie Moor    ED (erectile dysfunction)    GERD (gastroesophageal reflux disease)    Hyperlipidemia    Hypertension    Followed by Dr. Claiborne Billings   OSA on CPAP    Sinus bradycardia    Thrombocytopenia (HCC)    Tongue lesion    benign from trauma as of 06/20/21    Surgical History: Past Surgical History:  Procedure Laterality Date   CATARACT EXTRACTION, BILATERAL  2010   COLONOSCOPY WITH PROPOFOL N/A 04/15/2020   Procedure: COLONOSCOPY WITH PROPOFOL;  Surgeon: Robert Bellow, MD;  Location: [facility] ENDOSCOPY;  Service: Endoscopy;  Laterality: N/A;   CORONARY ARTERY BYPASS GRAFT  2000   LIMA to the LAD,RIMA to the PDA,vein graft to the diagonal & vein graft to the PLA   ESOPHAGOGASTRODUODENOSCOPY     Dr. Gustavo Lah   ESOPHAGOGASTRODUODENOSCOPY  04/15/2020   Procedure: ESOPHAGOGASTRODUODENOSCOPY (EGD);  Surgeon: Robert Bellow, MD;  Location: [facility]COPY;   Service: Endoscopy;;   KNEE ARTHROSCOPY  1991   KNEE DEBRIDEMENT  1989   Fluid flush   PENILE PROSTHESIS IMPLANT     06/26/19 Dr. Francesca Jewett   SHOULDER ARTHROSCOPY  2011   SHOULDER SURGERY  2010   bone spur    US ECHOCARDIOGRAPHY  12/12/2010   mild LA dilatation, normal LV systolic fx, EF > 41%    Home Medications:  Allergies as of 10/18/2021       Reactions   Tamsulosin Other (See Comments)   Chest discomfort, basal dilation with a warm feeling in chest         Medication List        Accurate as of October 18, 2021 11:59 PM. If you have any questions, ask your nurse or doctor.          STOP taking these medications    sulfamethoxazole-trimethoprim 800-160 MG tablet Commonly known as: BACTRIM DS Stopped by: Hollice Espy, MD       TAKE these medications    amLODipine 10 MG tablet Commonly known as: NORVASC Take 1 tablet (10 mg total) by mouth daily.   atorvastatin 40 MG tablet Commonly known as: LIPITOR Take 1 tablet (40 mg total) by mouth daily. At night   BD SafetyGlide Needle 18G X 1-1/2" Misc Generic drug: NEEDLE (DISP) 18 G Draw up medication with this needle   benazepril 40 MG tablet  Commonly known as: LOTENSIN Take 1 tablet (40 mg total) by mouth daily.   clopidogrel 75 MG tablet Commonly known as: PLAVIX Take 1 tablet (75 mg total) by mouth daily.   ezetimibe 10 MG tablet Commonly known as: ZETIA Take 1 tablet (10 mg total) by mouth daily.   pantoprazole 40 MG tablet Commonly known as: PROTONIX TAKE 1 TABLET BY MOUTH EVERY DAY 30 MINUTES BEFORE A MEAL   silodosin 8 MG Caps capsule Commonly known as: RAPAFLO Take 1 capsule (8 mg total) by mouth daily with breakfast.   SYRINGE-NEEDLE (DISP) 3 ML 23G X 1-1/2" 3 ML Misc Use as directed QOWEEK   testosterone cypionate 200 MG/ML injection Commonly known as: DEPOTESTOSTERONE CYPIONATE ADMINISTER 1.5 ML(300 MG) IN THE MUSCLE EVERY 14 DAYS        Allergies:  Allergies  Allergen  Reactions   Tamsulosin Other (See Comments)    Chest discomfort, basal dilation with a warm feeling in chest     Family History: Family History  Problem Relation Age of Onset   Arthritis Mother    Heart disease Father        CAD - MI   Stroke Father    Hypertension Father    Cancer Father 72       esophageal cancer    Arthritis Maternal Grandmother     Social History:  reports that he has been smoking cigarettes. He has a 22.00 pack-year smoking history. He has never used smokeless tobacco. He reports current alcohol use of about 10.0 standard drinks per week. He reports that he does not use drugs.   Physical Exam: BP (!) 151/74   Pulse (!) 51   Ht 5\' 11"  (1.803 m)   Wt 214 lb (97.1 kg)   BMI 29.85 kg/m   Constitutional:  Alert and oriented, No acute distress. HEENT: [place] AT, moist mucus membranes.  Trachea midline, no masses. Cardiovascular: No clubbing, cyanosis, or edema. Respiratory: Normal respiratory effort, no increased work of breathing. Skin: No rashes, bruises or suspicious lesions. Neurologic: Grossly intact, no focal deficits, moving all 4 extremities. Psychiatric: Normal mood and affect.  Laboratory Data:  Lab Results  Component Value Date   CREATININE 1.06 08/30/2021    Lab Results  Component Value Date   PSA 1.57 12/03/2018   PSA 0.96 12/03/2017   PSA 0.69 01/06/2014    Lab Results  Component Value Date   TESTOSTERONE 753 08/04/2021    Lab Results  Component Value Date   HGBA1C 5.9 06/20/2021    Assessment & Plan:    BPH with LUTS  - We discussed alternatives including TURP vs. holmium laser enucleation of the prostate vs. greenlight laser ablation. Differences between the surgical procedures were discussed as well as the risks and benefits of each.  He is not a candidate for UroLift due to the presence of the median lobe and overall volume of his prostate.  We discussed the common postoperative course following holep including need for  overnight Foley catheter, temporary worsening of irritative voiding symptoms, and occasional stress incontinence which typically lasts up to 6 months but can persist.  We discussed retrograde ejaculation and damage to surrounding structures including the urinary sphincter. Other uncommon complications including hematuria and urinary tract infection.  He understands all of the above and is willing to proceed as planned.  He will need cardiac clearance.  He is on Plavix.  He has been able to stop this for other procedures in the past.  -  Urine sent for pre-op culture  Return for HoLEP  I,Anel Creighton,acting as a scribe for Hollice Espy, MD.,have documented all relevant documentation on the behalf of Hollice Espy, MD,as directed by  Hollice Espy, MD while in the presence of Hollice Espy, [place] [address] [place], [place] [address] [phone]

## 2021-10-18 ENCOUNTER — Other Ambulatory Visit: Payer: Self-pay

## 2021-10-18 ENCOUNTER — Other Ambulatory Visit: Payer: Self-pay | Admitting: Urology

## 2021-10-18 ENCOUNTER — Ambulatory Visit (INDEPENDENT_AMBULATORY_CARE_PROVIDER_SITE_OTHER): Payer: Medicare Other | Admitting: Urology

## 2021-10-18 VITALS — BP 151/74 | HR 51 | Ht 71.0 in | Wt 214.0 lb

## 2021-10-18 DIAGNOSIS — I2510 Atherosclerotic heart disease of native coronary artery without angina pectoris: Secondary | ICD-10-CM | POA: Diagnosis not present

## 2021-10-18 DIAGNOSIS — N138 Other obstructive and reflux uropathy: Secondary | ICD-10-CM | POA: Diagnosis not present

## 2021-10-18 DIAGNOSIS — N401 Enlarged prostate with lower urinary tract symptoms: Secondary | ICD-10-CM | POA: Diagnosis not present

## 2021-10-18 NOTE — Patient Instructions (Signed)

## 2021-10-19 LAB — MICROSCOPIC EXAMINATION
Bacteria, UA: NONE SEEN
RBC, Urine: NONE SEEN /hpf (ref 0–2)

## 2021-10-19 LAB — URINALYSIS, COMPLETE
Bilirubin, UA: NEGATIVE
Glucose, UA: NEGATIVE
Ketones, UA: NEGATIVE
Leukocytes,UA: NEGATIVE
Nitrite, UA: NEGATIVE
Protein,UA: NEGATIVE
RBC, UA: NEGATIVE
Specific Gravity, UA: 1.015 (ref 1.005–1.030)
Urobilinogen, Ur: 0.2 mg/dL (ref 0.2–1.0)
pH, UA: 6.5 (ref 5.0–7.5)

## 2021-10-23 ENCOUNTER — Other Ambulatory Visit: Payer: Self-pay | Admitting: Urology

## 2021-10-23 ENCOUNTER — Ambulatory Visit: Payer: Medicare Other | Admitting: Urology

## 2021-10-23 DIAGNOSIS — N138 Other obstructive and reflux uropathy: Secondary | ICD-10-CM

## 2021-10-23 DIAGNOSIS — N401 Enlarged prostate with lower urinary tract symptoms: Secondary | ICD-10-CM

## 2021-10-23 NOTE — Progress Notes (Signed)
Surgical Physician Order Form [facility]  * Scheduling expectation : Next Available  *Length of Case:   *Clearance needed: yes, Dr. Claiborne Billings to hold plavix  *Anticoagulation Instructions: Hold all anticoagulants  *Aspirin Instructions: Hold Aspirin  *Post-op visit Date/Instructions:  1-3 day cath removal  *Diagnosis: BPH w/urinary obstruction  *Procedure:     HOLEP (82500)   Additional orders: N/A  -Admit type: OUTpatient  -Anesthesia: General  -VTE Prophylaxis Standing Order SCD's       Other:   -Standing Lab Orders Per Anesthesia    Lab other: None  -Standing Test orders EKG/Chest x-ray per Anesthesia       Test other:   - Medications:  Ancef 2gm IV  -Other orders:  N/A

## 2021-10-24 LAB — CULTURE, URINE COMPREHENSIVE

## 2021-10-27 ENCOUNTER — Telehealth: Payer: Self-pay | Admitting: *Deleted

## 2021-10-27 ENCOUNTER — Telehealth: Payer: Self-pay | Admitting: Urgent Care

## 2021-10-27 NOTE — Progress Notes (Signed)
[facility] Posting Form   Surgery Date/Time: Date: 11/27/2021  Surgeon: Dr. Hollice Espy, MD  Surgery Location: Day Surgery  Inpt ( No  )   Outpt (Yes)   Obs ( No  )   Diagnosis: BPH with Urinary Obstruction N40.1, N13.8  -CPT: 41287  Surgery: Holmium Laser Enucleation of the Prostate   Stop Anticoagulations: Yes and hold ASA  Cardiac/Medical/Pulmonary Clearance needed: Yes  Clearance needed from Dr: Claiborne Billings, Cardiology  Clearance request sent on: Date: 10/27/21  by Honor Loh, NP with Peri-Op Services.    *Orders entered into EPIC  Date: 10/27/21   *Case booked in [place]  Date: 10/26/2021  *Notified pt of Surgery: Date: 10/26/2021  PRE-OP UA & CX: no  *Placed into Prior Authorization Work Que Date: 10/27/21   Assistant/laser/rep:No

## 2021-10-27 NOTE — Telephone Encounter (Signed)
° ° °  Patient Name: Christian Cole  DOB: [birth year] MRN: [id number]  Primary Cardiologist: Shelva Majestic, MD  Chart reviewed as part of pre-operative protocol coverage. Given past medical history and time since last visit, based on ACC/AHA guidelines, JEANLUC WEGMAN would be at acceptable risk for the planned procedure without further cardiovascular testing.   Recent Myoview performed on 10/13/2021 shows no significant reversible ischemia.  Patient is cleared to proceed with upcoming prostate surgery.  He may hold Plavix for 5 to 7 days prior to the procedure and restart as soon as possible afterward at the surgeon's discretion based on the bleeding risk.  The patient was advised that if he develops new symptoms prior to surgery to contact our office to arrange for a follow-up visit, and he verbalized understanding.  I will route this recommendation to the requesting party via Epic fax function and remove from pre-op pool.  Please call with questions.  [place], [place] 10/27/2021, 10:18 AM

## 2021-10-27 NOTE — Telephone Encounter (Signed)
Request for pre-operative cardiac clearance Received: Today Karen Kitchens, NP  P Cv Div Preop Callback Request for pre-operative cardiac clearance:     1. What type of surgery is being performed?  HOLEP-LASER ENUCLEATION OF THE PROSTATE WITH MORCELLATION   2. When is this surgery scheduled?  11/27/2021     3. Are there any medications that need to be held prior to surgery?  CLOPIDOGREL   4. Practice name and name of physician performing surgery?  Performing surgeon: Dr. Hollice Espy, MD  Requesting clearance: Honor Loh, FNP-C       5. Anesthesia type (none, local, MAC, general)? General   6. What is the office phone and fax number?  Phone: [phone]  Fax: [phone]   ATTENTION: Unable to create telephone message as per your standard workflow. Directed by [facility] providers to send requests for cardiac clearance to this pool for appropriate distribution to provider covering pre-operative clearances.   Honor Loh, MSN, APRN, FNP-C, CEN  [facility]  Peri-operative Services Nurse Practitioner  Phone: [phone]  10/27/21 9:12 AM

## 2021-10-27 NOTE — Progress Notes (Signed)
°  Perioperative Services Pre-Admission/Anesthesia Testing     Date: 10/27/21  Name: Christian Cole MRN:   [id number]  Re: Request from surgery for clearance prior to scheduled procedure  Patient is scheduled to undergo a HoLEP on 11/27/2021 with Dr. Hollice Espy, MD. Patient has not been scheduled for his PAT appointment at this point, thus has not undergone review by PAT RN and/or APP. Received communication from primary attending surgeon's office requesting that patient be submitted for clearance from cardiology.   PROVIDER SPECIALTY FAXED TO   Shelva Majestic, MD   Cardiology [facility] APP Pool   Plan:  Clearance documents generated and faxed to appropriate provider(s) as noted above. Note will be updated to reflect communication with provider's office as it relates to clearance being provided and/or the need for office visit prior to clearance for surgery being issued.   Honor Loh, MSN, APRN, FNP-C, CEN [facility]  Peri-operative Services Nurse Practitioner Phone: [phone] 10/27/21 9:13 AM  NOTE: This note has been prepared using Dragon dictation software. Despite my best ability to proofread, there is always the potential that unintentional transcriptional errors may still occur from this process.

## 2021-10-30 ENCOUNTER — Other Ambulatory Visit: Payer: Self-pay | Admitting: Urology

## 2021-10-31 ENCOUNTER — Other Ambulatory Visit: Payer: Self-pay | Admitting: Urology

## 2021-11-13 ENCOUNTER — Encounter: Payer: Self-pay | Admitting: Oncology

## 2021-11-17 ENCOUNTER — Encounter
Admission: RE | Admit: 2021-11-17 | Discharge: 2021-11-17 | Disposition: A | Discharge status: Home / Self Care | Payer: Medicare Other | Source: Ambulatory Visit | Attending: Urology | Admitting: Urology

## 2021-11-17 ENCOUNTER — Other Ambulatory Visit: Payer: Self-pay

## 2021-11-17 NOTE — Pre-Procedure Instructions (Signed)
Cardiologist recommended stopping Plavix 5-7 days prior to surgery.  Last dose will be on 1/10

## 2021-11-17 NOTE — Patient Instructions (Signed)
Your procedure is scheduled on: Mon 1/16 Report to medical mall registration desk then to surgery desk on 2nd floor To find out your arrival time please call [phone] between 1PM - 3PM on .  Remember: Instructions that are not followed completely may result in serious medical risk,  up to and including death, or upon the discretion of your surgeon and anesthesiologist your  surgery may need to be rescheduled.     _X__ 1. Do not eat food after midnight the night before your procedure.                 No chewing gum or hard candies. You may drink clear liquids up to 2 hours                 before you are scheduled to arrive for your surgery- DO not drink clear                 liquids within 2 hours of the start of your surgery.                 Clear Liquids include:  water, apple juice without pulp, clear Gatorade, G2 or                  Gatorade Zero (avoid Red/Purple/Blue), Black Coffee or Tea (Do not add                 anything to coffee or tea). __X__2.  On the morning of surgery brush your teeth with toothpaste and water, you                may rinse your mouth with mouthwash if you wish.  Do not swallow any toothpaste of mouthwash.     _X__ 3.  No Alcohol for 24 hours before or after surgery.   _X__ 4.  Do Not Smoke or use e-cigarettes For 24 Hours Prior to Your Surgery.                 Do not use any chewable tobacco products for at least 6 hours prior to                 Surgery.  ___  5.  Do not use any recreational drugs (marijuana, cocaine, heroin, ecstasy, MDMA or other) For at least one week prior to your surgery.    Combination of these drugs with anesthesia may have life threatening  results.  ____  6.  Bring all medications with you on the day of surgery if instructed.   __x__  7.  Notify your doctor if there is any change in your medical condition      (cold, fever, infections).     Do not wear jewelry,  Do not wear lotions, powders, or  perfumes. You may wear deodorant. Do not shave 48 hours prior to surgery. Men may shave face and neck. Do not bring valuables to the hospital.    [facility] is not responsible for any belongings or valuables.  Contacts, dentures or bridgework may not be worn into surgery. Leave your suitcase in the car. After surgery it may be brought to your room. For patients admitted to the hospital, discharge time is determined by your treatment team.   Patients discharged the day of surgery will not be allowed to drive home.   Make arrangements for someone to be with you for the first 24 hours of your Same Day Discharge.  Please read over the following fact sheets that you were given:       __x__ Take these medicines the morning of surgery with A SIP OF WATER:    1. pantoprazole (PROTONIX) 40 MG tablet            2. silodosin (RAPAFLO) 8 MG CAPS capsule  3.   4.  5.  6.  ____ Fleet Enema (as directed)   __x__ Shower the night before and morning of surgery  ____ Use Benzoyl Peroxide Gel as instructed  ____ Use inhalers on the day of surgery  ____ Stop metformin 2 days prior to surgery    ____ Take 1/2 of usual insulin dose the night before surgery. No insulin the morning          of surgery.   __x__ Stop Plavix last dose on 1/10 as per MD instructions  __x__ Stop Anti-inflammatories on no ibuprofen aleve or aspirin products after 1/9   ____ Stop supplements until after surgery.    __x__ Bring C-Pap to the hospital.    If you have any questions regarding your pre-procedure instructions,  Please call Pre-admit Testing at [phone]

## 2021-11-20 ENCOUNTER — Other Ambulatory Visit: Payer: Self-pay

## 2021-11-20 ENCOUNTER — Ambulatory Visit (INDEPENDENT_AMBULATORY_CARE_PROVIDER_SITE_OTHER): Payer: Medicare Other | Admitting: Cardiovascular Disease

## 2021-11-20 ENCOUNTER — Encounter: Payer: Self-pay | Admitting: Cardiovascular Disease

## 2021-11-20 DIAGNOSIS — G4733 Obstructive sleep apnea (adult) (pediatric): Secondary | ICD-10-CM | POA: Diagnosis not present

## 2021-11-20 DIAGNOSIS — Z951 Presence of aortocoronary bypass graft: Secondary | ICD-10-CM

## 2021-11-20 DIAGNOSIS — I2510 Atherosclerotic heart disease of native coronary artery without angina pectoris: Secondary | ICD-10-CM | POA: Diagnosis not present

## 2021-11-20 DIAGNOSIS — Z9989 Dependence on other enabling machines and devices: Secondary | ICD-10-CM

## 2021-11-20 DIAGNOSIS — R0789 Other chest pain: Secondary | ICD-10-CM | POA: Diagnosis not present

## 2021-11-20 DIAGNOSIS — Z720 Tobacco use: Secondary | ICD-10-CM | POA: Diagnosis not present

## 2021-11-20 DIAGNOSIS — I10 Essential (primary) hypertension: Secondary | ICD-10-CM | POA: Diagnosis not present

## 2021-11-20 NOTE — Progress Notes (Signed)
Patient ID: Christian Cole, male   DOB: [birth year], 71 y.o.   MRN: [id number]     HPI: Christian Cole is a 71 y.o. male the presents for an 2 month cardiology/sleep evaluation.  Christian Cole has known CAD and underwent initial percutaneous coronary intervention in 1998 of an unknown vessel. Christian Cole underwent CABG revascularization surgery in December 2000 with LIMA to LAD, LIMA to the PDA, vein to the diagonal, and then to the PLA. 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  An echo Doppler study in January 2002 showed normal systolic function. Christian Cole mild TR and mild Christian. A nuclear perfusion study in March 2013 showed normal perfusion without scar or ischemia.  When I saw him in 2016 Christian Cole had continued to do well and remained active and denied any chest pain.   Christian Cole Christian Cole had seen Cecilie Kicks in March 2016 and she had reduced Christian Cole metoprolol to 12.5 mg twice a day due to asymptomatic bradycardia.  Christian Cole had blood work in December 2015 by Christian Cole primary care physician and Christian Cole total cholesterol was 166, HDL 32, LDL 104, VLDL 29 and triglycerides 149.  Christian Cole has been maintained on atorvastatin 40 mg, Plavix 75 mg, pantoprazole 40 mg, as well as Viagra as needed.  Christian Cole underwent a nuclear perfusion study on April 26, 2015.  Christian Cole remained asymptomatic.  Christian Cole developed asymptomatic ST segment depression during stress.  Scintigraphic images were entirely normal.  Christian Cole had normal function.  Ejection fraction was 52%.  Christian Cole presents for evaluation.  When I saw him in October 2017 Christian Cole was doing well and was without chest pain.  Christian Cole was exercising daily.  Christian Cole had lost over 40 pounds in the 8 months prior to that evaluation.  Christian Cole peak weight was 242 and Christian Cole weight this morning without clothes at home was 195.  Christian Cole continues to use CPAP with 100% compliance.   Christian Cole was evaluated by Bernerd Pho in October  2018.  Christian Cole blood pressure was elevated and amlodipine 5 mg was added to Christian Cole benazepril and Lopressor.  Christian Cole had resumed smoking and at that time was given a prescription for Chantix.  I saw him in December 2019 and prior to that evaluation Christian Cole had  received a new CPAP machine which was prescribed by physician in [place].  Christian Cole was sleeping well and cannot sleep without Christian Cole CPAP.  Unfortunately Christian Cole was still smoking 1/2 pack of cigarettes per day.  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  Christian Cole walks at least 2 miles.  Christian Cole denies any chest pain PND orthopnea.   Christian Cole underwent carotid Dopplers in June 2020 which did not reveal any significant internal carotid stenoses in the 1 to 39% range.  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  A Myoview study in July 2020 showed EF 55 to 60% without scar or ischemia.  Christian Cole was seen by Almyra Deforest, PA for preoperative clearance and underwent successful inflatable penile prosthesis by Dr. Meta Hatchet at [facility] urology.  Christian Cole tolerated surgery without cardiovascular compromise.  I saw him in February 2021 at which time Christian Cole denied any anginal symptomatology.  Unfortunately Christian Cole was still smoking.  Christian Cole continued to be compliant with CPAP therapy and "cannot sleep without it."  I saw him on January 09, 2021.  Christian Cole remained stable and denied any chest pain or shortness  of breath but unfortunately was still smoking.   Christian Cole has been working around the house remodeling Christian Cole bathroom.  Christian Cole has been on amlodipine 7.5 mg, benazepril 40 mg for hypertension, atorvastatin 40 mg and Zetia for hyperlipidemia, and is on testosterone injections every 14 days with continued low testosterone levels.  Christian Cole continues to use CPAP with 100% compliance.    I last saw him on September 04, 2021 and since Christian Cole prior evaluation Christian Cole was evaluated at [facility] ER and presented with a warm sensation to the left side of Christian Cole chest radiating to Christian Cole neck.  It was not  exertionally precipitated.  A chest x-ray did not reveal any active cardiopulmonary disease.  Christian Cole ECG did not show any ischemia.  Troponins were negative.  Christian Cole believes Christian Cole started to notice the symptoms when Christian Cole was started on Flomax and Christian Cole was felt to have possible sensitivity to this.  Christian Cole is now on silodosin (Rapaflo) and is tolerating this well.  Christian Cole continues to be on amlodipine 7.5 mg daily and benazepril 40 mg for hypertension.  Christian Cole is on atorvastatin 40 mg and Zetia 10 mg for hyperlipidemia.  Christian Cole is on testosterone supplementation every 2 weeks.  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  A download was obtained from September 22 through September 01, 2021 which shows excellent compliance with average use at 8 hours and 16 minutes per night.  Christian Cole has been using a Respironics DreamWear mask.  At 13 cm set pressure, AHI is 5.3.  Christian Cole is still smoking at least a half a pack of cigarettes per day and Christian Cole wife also smokes.  Christian Cole has some discomfort from bilateral inguinal hernias for which Christian Cole wears a truss.  During that evaluation, with Christian Cole ongoing tobacco history, and chest pain symptomatology I recommended Christian Cole undergo a Lexiscan Myoview study.  Christian Cole was continuing to use CPAP with excellent compliance and "cannot sleep without it."   On October 13, 2021 Christian Cole underwent Christian Cole [place] Myoview study.  This was low risk and did not demonstrate any ischemia.  There was normal wall motion.    Presently, Christian Cole is sleeping well.  Christian Cole denies recurrent chest pain.  Christian Cole typically goes to bed at 10 PM and wakes up at 6:30 AM.  Download was obtained of Christian Cole CPAP unit from December 8 through November 17, 2021.  Compliance is excellent with average use of 7 hours and 27 minutes.  Christian Cole current CPAP unit is not an auto unit and is set at a pressure of 14 cm.  AHI was slightly increased at 6.5.  Christian Cole continues to be on amlodipine and benazepril for blood pressure control.  Christian Cole is on testosterone injections every 2  weeks and often undergoes phlebotomy followed by hematology.  Christian Cole next phlebotomy scheduled for January 11.  Christian Cole presents for evaluation.   Past Medical History:  Diagnosis Date   CAD (coronary artery disease)    a. s/p CABG in 2000 with LIMA-LAD, RIMA-PDA, SVG-D1, and SVG-PLA b. low-risk NST in 2016   Cancer [facility])    skin cancer follows Dr. Roseanne Kaufman    ED (erectile dysfunction)    GERD (gastroesophageal reflux disease)    Hyperlipidemia    Hypertension    Followed by Dr. Tresa Endo   OSA on CPAP    Sinus bradycardia    Thrombocytopenia (HCC)    Tongue lesion    benign from trauma as of 06/20/21    Past Surgical History:  Procedure Laterality Date   CATARACT EXTRACTION, BILATERAL  2010   COLONOSCOPY WITH PROPOFOL N/A 04/15/2020   Procedure: COLONOSCOPY WITH PROPOFOL;  Surgeon: Robert Bellow, MD;  Location: [facility] ENDOSCOPY;  Service: Endoscopy;  Laterality: N/A;   CORONARY ARTERY BYPASS GRAFT  2000   LIMA to the LAD,RIMA to the PDA,vein graft to the diagonal & vein graft to the PLA   ESOPHAGOGASTRODUODENOSCOPY     Dr. Gustavo Lah   ESOPHAGOGASTRODUODENOSCOPY  04/15/2020   Procedure: ESOPHAGOGASTRODUODENOSCOPY (EGD);  Surgeon: Robert Bellow, MD;  Location: [facility] ENDOSCOPY;  Service: Endoscopy;;   KNEE ARTHROSCOPY Right 1991   KNEE DEBRIDEMENT Right 1989   Fluid flush   PENILE PROSTHESIS IMPLANT     06/26/19 Dr. Francesca Jewett   SHOULDER ARTHROSCOPY Left 2011   US ECHOCARDIOGRAPHY  12/12/2010   mild LA dilatation, normal LV systolic fx, EF > 54%    Allergies  Allergen Reactions   Tamsulosin Other (See Comments)    Chest discomfort, basal dilation with a warm feeling in chest     Current Outpatient Medications  Medication Sig Dispense Refill   amLODipine (NORVASC) 10 MG tablet Take 1 tablet (10 mg total) by mouth daily. 90 tablet 3   atorvastatin (LIPITOR) 40 MG tablet Take 1 tablet (40 mg total) by mouth daily. At night 90 tablet 3   benazepril (LOTENSIN) 40 MG tablet Take 1  tablet (40 mg total) by mouth daily. 90 tablet 3   clopidogrel (PLAVIX) 75 MG tablet Take 1 tablet (75 mg total) by mouth daily. 90 tablet 2   ezetimibe (ZETIA) 10 MG tablet Take 1 tablet (10 mg total) by mouth daily. 90 tablet 3   Multiple Vitamins-Minerals (MULTIVITAMIN WITH MINERALS) tablet Take 1 tablet by mouth daily. Men 50+     NEEDLE, DISP, 18 G (BD SAFETYGLIDE NEEDLE) 18G X 1-1/2" MISC Draw up medication with this needle 30 each 0   pantoprazole (PROTONIX) 40 MG tablet TAKE 1 TABLET BY MOUTH EVERY DAY 30 MINUTES BEFORE A MEAL 90 tablet 3   silodosin (RAPAFLO) 8 MG CAPS capsule Take 1 capsule (8 mg total) by mouth daily with breakfast. 30 capsule 3   SYRINGE-NEEDLE, DISP, 3 ML 23G X 1-1/2" 3 ML MISC Use as directed QOWEEK 30 each 5   testosterone cypionate (DEPOTESTOSTERONE CYPIONATE) 200 MG/ML injection ADMINISTER 1.5 ML(300 MG) IN THE MUSCLE EVERY 14 DAYS 10 mL 0   No current facility-administered medications for this visit.    Social History   Socioeconomic History   Marital status: Married    Spouse name: Not on file   Number of children: 3   Years of education: 16   Highest education level: Not on file  Occupational History   Occupation: Retired Corporate treasurer   Occupation: Management/Technical Support    Comment: Psychiatric nurse  Tobacco Use   Smoking status: Every Day    Packs/day: 0.50    Years: 44.00    Pack years: 22.00    Types: Cigarettes   Smokeless tobacco: Never   Tobacco comments:    on and off smoker   Vaping Use   Vaping Use: Never used  Substance and Sexual Activity   Alcohol use: Yes    Alcohol/week: 10.0 standard drinks    Types: 10 Cans of beer per week    Comment: Down to 3 beers a day from 4-6 a day    Drug use: No   Sexual activity: Yes    Partners: Female    Comment: Wife  Other Topics Concern   Not on file  Social History Narrative   Jahzeel grew up in [place]. Christian Cole is currently living in [place] with Christian Cole wife. This is Christian Cole second marriage. Christian Cole has  1 daughter and 2 sons from Christian Cole first marriage. Christian Cole has 4 step kids. Christian Cole daughter lives in [place], one son in [place], [place] and the other son in [place], [place]. 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    Christian Cole enjoys wood working on Christian Cole spare time. 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 Christian Cole also enjoys shooting sports - target shooting and [employer] Camera operator).       Christian Cole retired 04/2019 and enjoys this       2 brothers and 2 sisters Christian Cole is next to youngest    Social Determinants of Radio broadcast assistant Strain: Not on file  Food Insecurity: Not on file  Transportation Needs: Not on file  Physical Activity: Not on file  Stress: Not on file  Social Connections: Not on file  Intimate Partner Violence: Not on file    Family History  Problem Relation Age of Onset   Arthritis Mother    Heart disease Father        CAD - MI   Stroke Father    Hypertension Father    Cancer Father 60       esophageal cancer    Arthritis Maternal Grandmother     ROS General: Negative; No fevers, chills, or night sweats;  HEENT: Negative; No changes in vision or hearing, sinus congestion, difficulty swallowing Pulmonary: Negative; No cough, wheezing, shortness of breath, hemoptysis Cardiovascular: See history of present illness GI: Negative; No nausea, vomiting, diarrhea, or abdominal pain GU: Low testosterone no dysuria, hematuria, or difficulty voiding Musculoskeletal: Negative; no myalgias, joint pain, or weakness Hematologic/Oncology: Negative; no easy bruising, bleeding Endocrine: Negative; no heat/cold intolerance; no diabetes Neuro: Negative; no changes in balance, headaches Skin: Negative; No rashes or skin lesions Psychiatric: Negative; No behavioral problems, depression Sleep: Positive for OSA on CPAP therapy.  No snoring, daytime sleepiness, hypersomnolence, bruxism, restless legs, hypnogognic  hallucinations, no cataplexy Other comprehensive 14 point system review is negative.  PE BP 130/70 (BP Location: Left Arm)    Pulse (!) 56    Ht $R'5\' 11"'qi$  (1.803 m)    Wt 216 lb 9.6 oz (98.2 kg)    SpO2 96%    BMI 30.21 kg/m    Repeat blood pressure 128/72  Wt Readings from Last 3 Encounters:  11/20/21 216 lb 9.6 oz (98.2 kg)  11/17/21 214 lb (97.1 kg)  10/18/21 214 lb (97.1 kg)    General: Alert, oriented, no distress.  Skin: normal turgor, no rashes, warm and dry HEENT: Normocephalic, atraumatic. Pupils equal round and reactive to light; sclera anicteric; extraocular muscles intact; Nose without nasal septal hypertrophy Mouth/Parynx benign; Mallinpatti scale 3 Neck: No JVD, no carotid bruits; normal carotid upstroke Lungs: clear to ausculatation and percussion; no wheezing or rales Chest wall: without tenderness to palpitation Heart: PMI not displaced, RRR, s1 s2 normal, 1/6 systolic murmur, no diastolic murmur, no rubs, gallops, thrills, or heaves Abdomen: soft, nontender; no hepatosplenomehaly, BS+; abdominal aorta nontender and not dilated by palpation. Back: no CVA tenderness Pulses 2+ Musculoskeletal: full range of motion, normal strength, no joint deformities Extremities: no clubbing cyanosis or edema, Homan's sign negative  Neurologic: grossly nonfocal; Cranial  nerves grossly wnl Psychologic: Normal mood and affect   November 20, 2021 ECG (independently read by me): Sinus bradycardia at 56, 1st degree AV block; PR 246 msec  September 04, 2021 ECG (independently read by me): Sinus bradycardia 55, PR 210 msec, inferior T wave changes and Q wave III, aVF,   January 09, 2021 ECG (independently read by me): Sinus bradycardia at 53; 1st degree block, PR 222 msec, borderline LVH  February 2021 ECG (independently read by me): Sinus bradycardia at 58 bpm, first-degree AV block with a PR interval at 244 ms.  No significant ST-T changes.  QTc interval 431 ms  December 2019 ECG  (independently read by me): Sinus bradycardia at 56 bpm.  First-degree block with PR interval 264 ms.  QTc interval 443 ms  October 2017 ECG (independently read by me): Sinus bradycardia at 44 bpm with first degree AV block.  Q wave in leads III and F.  PR interval 224 ms.  June 2016 ECG (independently read by me): not done today due to recent treadmill nuclear study  ECG (independently read by me): Sinus bradycardia at 45 beats per minute. Normal intervals. Q waves in lead 3 and small Q wave in aVF.  LABS: BMP Latest Ref Rng & Units 08/30/2021 08/08/2021 06/07/2021  Glucose 70 - 99 mg/dL 118(H) 92 132(H)  BUN 8 - 23 mg/dL $Remove'12 11 15  'MHGzUlF$ Creatinine 0.61 - 1.24 mg/dL 1.06 0.97 1.11  BUN/Creat Ratio 10 - 24 - - -  Sodium 135 - 145 mmol/L 137 140 138  Potassium 3.5 - 5.1 mmol/L 4.2 4.2 4.1  Chloride 98 - 111 mmol/L 105 104 108  CO2 22 - 32 mmol/L $RemoveB'28 26 25  'ZJaKToHr$ Calcium 8.9 - 10.3 mg/dL 8.6(L) 9.3 8.6(L)   Hepatic Function Latest Ref Rng & Units 08/30/2021 08/08/2021 06/07/2021  Total Protein 6.5 - 8.1 g/dL 7.1 7.4 6.7  Albumin 3.5 - 5.0 g/dL 4.1 4.5 4.2  AST 15 - 41 U/L $Remo'25 22 25  'cKpzZ$ ALT 0 - 44 U/L $Remo'23 22 22  'SHHHb$ Alk Phosphatase 38 - 126 U/L 69 78 69  Total Bilirubin 0.3 - 1.2 mg/dL 1.0 1.0 0.8   CBC Latest Ref Rng & Units 08/30/2021 08/08/2021 07/21/2021  WBC 4.0 - 10.5 K/uL 7.5 9.1 -  Hemoglobin 13.0 - 17.0 g/dL 16.1 16.3 15.9  Hematocrit 39.0 - 52.0 % 48.2 47.5 47.6  Platelets 150 - 400 K/uL 129(L) 155 -   Lab Results  Component Value Date   MCV 94.1 08/30/2021   MCV 94.6 08/08/2021   MCV 95.8 06/07/2021   Lab Results  Component Value Date   TSH 1.60 12/21/2020   Lab Results  Component Value Date   HGBA1C 5.9 06/20/2021    Lipid Panel     Component Value Date/Time   CHOL 123 06/20/2021 1030   CHOL 167 12/17/2019 1113   TRIG 77.0 06/20/2021 1030   HDL 57.50 06/20/2021 1030   HDL 86 12/17/2019 1113   CHOLHDL 2 06/20/2021 1030   VLDL 15.4 06/20/2021 1030   LDLCALC 50 06/20/2021 1030    LDLCALC 65 12/17/2019 1113   LDLDIRECT 91.6 01/06/2014 1443    RADIOLOGY: No results found.  IMPRESSION: 1. OSA on CPAP   2. CAD in native artery   3. Hx of CABG   4. Atypical chest pain   5. Essential hypertension   6. Tobacco use     ASSESSMENT AND PLAN: Christian Cole is a 71  year-old gentleman who has a  history of CAD and underwent initial intervention in 1998. Due to progressive CAD Christian Cole underwent CABG surgery in 2000. A nuclear perfusion study in 2013 remained normal without scar or ischemia.  A three-year follow-up nuclear perfusion study on 04/26/2015 continued to show entirely normal perfusion without scar or ischemia.  However, Christian Cole developed asymptomatic ST depression during stress.  For this reason, it was interpreted as intermediate study.  Post-rest ejection fraction was 52%.  A repeat Myoview in July 2020 EF 55 to 60% was without ischemia or scar.  Christian Cole underwent successful penile implant surgery without cardiovascular compromise.  Unfortunately Christian Cole continues to smoke cigarettes and developed atypical chest pain symptomatology at Christian Cole last evaluation.  A subsequent Lexiscan Myoview study done on October 13, 2021 was unchanged and remains low risk without ischemia.  Christian Cole has bilateral inguinal hernia for which Christian Cole wears a truss.  Christian Cole has been on long-term testosterone injections and is followed by hematology and undergoes periodic phlebotomy secondary to increasing hemoglobin.  Christian Cole continues to use CPAP for Christian Cole obstructive sleep apnea.  Christian Cole DME company is [employer] "in [place].  Every reviewed Christian Cole most recent download.  At 14 cm set pressure AHI is elevated at 6.5.  Christian Cole does not have an auto unit and I will change Christian Cole set pressure to 16 cm of water.  I discussed the importance of smoking cessation.  Christian Cole will be following up with hematology, urology, and Christian Cole primary provider.  I will see him in 6 months for reevaluation.   Troy Sine, MD, [facility]  11/21/2021 6:37 PM

## 2021-11-20 NOTE — Patient Instructions (Addendum)
Medication Instructions:  The current medical regimen is effective;  continue present plan and medications as directed. Please refer to the Current Medication list given to you today.   *If you need a refill on your cardiac medications before your next appointment, please call your pharmacy*  Lab Work:   Testing/Procedures:  NONE    NONE  Special Instructions   Follow-Up: Your next appointment:  6 month(s) In Person with Shelva Majestic, MD   Please call our office 2 months in advance to schedule this appointment   At [facility], you and your health needs are our priority.  As part of our continuing mission to provide you with exceptional heart care, we have created designated Provider Care Teams.  These Care Teams include your primary Cardiologist (physician) and Advanced Practice Providers (APPs -  Physician Assistants and Nurse Practitioners) who all work together to provide you with the care you need, when you need it.

## 2021-11-21 ENCOUNTER — Encounter: Payer: Self-pay | Admitting: Oncology

## 2021-11-21 ENCOUNTER — Encounter: Payer: Self-pay | Admitting: Cardiovascular Disease

## 2021-11-22 ENCOUNTER — Inpatient Hospital Stay: Payer: Medicare Other

## 2021-11-22 ENCOUNTER — Inpatient Hospital Stay: Payer: Medicare Other | Attending: Oncology

## 2021-11-22 ENCOUNTER — Other Ambulatory Visit: Payer: Self-pay

## 2021-11-22 DIAGNOSIS — D751 Secondary polycythemia: Secondary | ICD-10-CM | POA: Insufficient documentation

## 2021-11-22 DIAGNOSIS — D696 Thrombocytopenia, unspecified: Secondary | ICD-10-CM | POA: Diagnosis not present

## 2021-11-22 LAB — HEMOGLOBIN AND HEMATOCRIT, BLOOD
HCT: 49.3 % (ref 39.0–52.0)
Hemoglobin: 16.7 g/dL (ref 13.0–17.0)

## 2021-11-22 NOTE — Progress Notes (Signed)
HCT < 50; No phlebotomy required today. Patient aware of results.

## 2021-11-23 ENCOUNTER — Encounter: Payer: Self-pay | Admitting: Oncology

## 2021-11-27 ENCOUNTER — Ambulatory Visit: Payer: Medicare Other | Admitting: Urgent Care

## 2021-11-27 ENCOUNTER — Ambulatory Visit
Admission: RE | Admit: 2021-11-27 | Discharge: 2021-11-27 | Disposition: A | Discharge status: Home / Self Care | Payer: Medicare Other | Attending: Urology | Admitting: Urology

## 2021-11-27 ENCOUNTER — Encounter
Admission: RE | Disposition: A | Discharge status: Home / Self Care | Payer: Self-pay | Source: Home / Self Care | Attending: Urology

## 2021-11-27 ENCOUNTER — Encounter: Payer: Self-pay | Admitting: Urology

## 2021-11-27 ENCOUNTER — Other Ambulatory Visit: Payer: Self-pay

## 2021-11-27 DIAGNOSIS — E785 Hyperlipidemia, unspecified: Secondary | ICD-10-CM | POA: Insufficient documentation

## 2021-11-27 DIAGNOSIS — Z951 Presence of aortocoronary bypass graft: Secondary | ICD-10-CM | POA: Diagnosis not present

## 2021-11-27 DIAGNOSIS — F17200 Nicotine dependence, unspecified, uncomplicated: Secondary | ICD-10-CM | POA: Diagnosis not present

## 2021-11-27 DIAGNOSIS — N320 Bladder-neck obstruction: Secondary | ICD-10-CM | POA: Insufficient documentation

## 2021-11-27 DIAGNOSIS — I10 Essential (primary) hypertension: Secondary | ICD-10-CM | POA: Diagnosis not present

## 2021-11-27 DIAGNOSIS — I2510 Atherosclerotic heart disease of native coronary artery without angina pectoris: Secondary | ICD-10-CM | POA: Insufficient documentation

## 2021-11-27 DIAGNOSIS — N401 Enlarged prostate with lower urinary tract symptoms: Secondary | ICD-10-CM | POA: Diagnosis not present

## 2021-11-27 DIAGNOSIS — N138 Other obstructive and reflux uropathy: Secondary | ICD-10-CM

## 2021-11-27 DIAGNOSIS — E669 Obesity, unspecified: Secondary | ICD-10-CM | POA: Insufficient documentation

## 2021-11-27 DIAGNOSIS — J449 Chronic obstructive pulmonary disease, unspecified: Secondary | ICD-10-CM | POA: Insufficient documentation

## 2021-11-27 DIAGNOSIS — R338 Other retention of urine: Secondary | ICD-10-CM | POA: Insufficient documentation

## 2021-11-27 DIAGNOSIS — K219 Gastro-esophageal reflux disease without esophagitis: Secondary | ICD-10-CM | POA: Insufficient documentation

## 2021-11-27 DIAGNOSIS — Z79899 Other long term (current) drug therapy: Secondary | ICD-10-CM | POA: Insufficient documentation

## 2021-11-27 DIAGNOSIS — Z6830 Body mass index (BMI) 30.0-30.9, adult: Secondary | ICD-10-CM | POA: Insufficient documentation

## 2021-11-27 DIAGNOSIS — G4733 Obstructive sleep apnea (adult) (pediatric): Secondary | ICD-10-CM | POA: Diagnosis not present

## 2021-11-27 HISTORY — PX: HOLEP-LASER ENUCLEATION OF THE PROSTATE WITH MORCELLATION: SHX6641

## 2021-11-27 SURGERY — ENUCLEATION, PROSTATE, USING LASER, WITH MORCELLATION
Anesthesia: General

## 2021-11-27 MED ORDER — FUROSEMIDE 10 MG/ML IJ SOLN
INTRAMUSCULAR | Status: DC | PRN
  Administered 2021-11-27: 10 mg via INTRAMUSCULAR

## 2021-11-27 MED ORDER — ACETAMINOPHEN 10 MG/ML IV SOLN
INTRAVENOUS | Status: DC | PRN
  Administered 2021-11-27: 1000 mg via INTRAVENOUS

## 2021-11-27 MED ORDER — OXYCODONE HCL 5 MG PO TABS
ORAL_TABLET | ORAL | Status: AC
  Filled 2021-11-27: qty 1

## 2021-11-27 MED ORDER — ONDANSETRON HCL 4 MG/2ML IJ SOLN
INTRAMUSCULAR | Status: AC
  Filled 2021-11-27: qty 2

## 2021-11-27 MED ORDER — EPHEDRINE 5 MG/ML INJ
INTRAVENOUS | Status: AC
  Filled 2021-11-27: qty 5

## 2021-11-27 MED ORDER — ROCURONIUM BROMIDE 10 MG/ML (PF) SYRINGE
PREFILLED_SYRINGE | INTRAVENOUS | Status: AC
  Filled 2021-11-27: qty 10

## 2021-11-27 MED ORDER — FUROSEMIDE 10 MG/ML IJ SOLN
INTRAMUSCULAR | Status: AC
  Filled 2021-11-27: qty 4

## 2021-11-27 MED ORDER — EPHEDRINE SULFATE 50 MG/ML IJ SOLN
INTRAMUSCULAR | Status: DC | PRN
  Administered 2021-11-27: 15 mg via INTRAVENOUS
  Administered 2021-11-27: 10 mg via INTRAVENOUS

## 2021-11-27 MED ORDER — ORAL CARE MOUTH RINSE: 15.0000 mL | Freq: Once | OROMUCOSAL | Status: AC

## 2021-11-27 MED ORDER — LIDOCAINE HCL (PF) 2 % IJ SOLN
INTRAMUSCULAR | Status: AC
  Filled 2021-11-27: qty 10

## 2021-11-27 MED ORDER — ACETAMINOPHEN 10 MG/ML IV SOLN
INTRAVENOUS | Status: AC
  Filled 2021-11-27: qty 100

## 2021-11-27 MED ORDER — CHLORHEXIDINE GLUCONATE 0.12 % MT SOLN: 15.0000 mL | Freq: Once | OROMUCOSAL | Status: AC

## 2021-11-27 MED ORDER — FENTANYL CITRATE (PF) 100 MCG/2ML IJ SOLN
INTRAMUSCULAR | Status: AC
  Filled 2021-11-27: qty 2

## 2021-11-27 MED ORDER — LIDOCAINE HCL (CARDIAC) PF 100 MG/5ML IV SOSY
PREFILLED_SYRINGE | INTRAVENOUS | Status: DC | PRN
  Administered 2021-11-27: 80 mg via INTRAVENOUS

## 2021-11-27 MED ORDER — CEFAZOLIN SODIUM-DEXTROSE 2-4 GM/100ML-% IV SOLN
INTRAVENOUS | Status: AC
  Filled 2021-11-27: qty 100

## 2021-11-27 MED ORDER — PROPOFOL 10 MG/ML IV BOLUS
INTRAVENOUS | Status: DC | PRN
Start: 2021-11-27 — End: 2021-11-27
  Administered 2021-11-27 (×2): 50 mg via INTRAVENOUS
  Administered 2021-11-27: 150 mg via INTRAVENOUS

## 2021-11-27 MED ORDER — OXYCODONE HCL 5 MG PO TABS
5.0000 mg | ORAL_TABLET | Freq: Once | ORAL | Status: AC
  Administered 2021-11-27: 5 mg via ORAL

## 2021-11-27 MED ORDER — DEXAMETHASONE SODIUM PHOSPHATE 10 MG/ML IJ SOLN
INTRAMUSCULAR | Status: AC
  Filled 2021-11-27: qty 1

## 2021-11-27 MED ORDER — SUGAMMADEX SODIUM 200 MG/2ML IV SOLN
INTRAVENOUS | Status: DC | PRN
  Administered 2021-11-27: 200 mg via INTRAVENOUS

## 2021-11-27 MED ORDER — ROCURONIUM BROMIDE 100 MG/10ML IV SOLN
INTRAVENOUS | Status: DC | PRN
  Administered 2021-11-27: 20 mg via INTRAVENOUS
  Administered 2021-11-27: 60 mg via INTRAVENOUS
  Administered 2021-11-27: 30 mg via INTRAVENOUS

## 2021-11-27 MED ORDER — ONDANSETRON HCL 4 MG/2ML IJ SOLN
INTRAMUSCULAR | Status: DC | PRN
  Administered 2021-11-27: 4 mg via INTRAVENOUS

## 2021-11-27 MED ORDER — CEFAZOLIN SODIUM-DEXTROSE 2-4 GM/100ML-% IV SOLN
2.0000 g | INTRAVENOUS | Status: AC
  Administered 2021-11-27: 2 g via INTRAVENOUS

## 2021-11-27 MED ORDER — HYDROCODONE-ACETAMINOPHEN 5-325 MG PO TABS
1.0000 | ORAL_TABLET | Freq: Four times a day (QID) | ORAL | 0 refills | Status: DC | PRN
Start: 2021-11-27 — End: 2021-12-25

## 2021-11-27 MED ORDER — LACTATED RINGERS IV SOLN: INTRAVENOUS | Status: DC

## 2021-11-27 MED ORDER — CHLORHEXIDINE GLUCONATE 0.12 % MT SOLN
OROMUCOSAL | Status: AC
  Administered 2021-11-27: 15 mL via OROMUCOSAL
  Filled 2021-11-27: qty 15

## 2021-11-27 MED ORDER — SODIUM CHLORIDE 0.9 % IR SOLN
Status: DC | PRN
  Administered 2021-11-27: 45000 mL

## 2021-11-27 MED ORDER — PROPOFOL 10 MG/ML IV BOLUS
INTRAVENOUS | Status: AC
  Filled 2021-11-27: qty 40

## 2021-11-27 MED ORDER — OXYBUTYNIN CHLORIDE 5 MG PO TABS: 5.0000 mg | ORAL_TABLET | Freq: Three times a day (TID) | ORAL | 0 refills | Status: DC | PRN

## 2021-11-27 MED ORDER — DEXAMETHASONE SODIUM PHOSPHATE 10 MG/ML IJ SOLN
INTRAMUSCULAR | Status: DC | PRN
  Administered 2021-11-27: 10 mg via INTRAVENOUS

## 2021-11-27 MED ORDER — FENTANYL CITRATE (PF) 100 MCG/2ML IJ SOLN
INTRAMUSCULAR | Status: DC | PRN
  Administered 2021-11-27 (×4): 50 ug via INTRAVENOUS

## 2021-11-27 SURGICAL SUPPLY — 36 items
ADAPTER IRRIG TUBE 2 SPIKE SOL (ADAPTER) ×4 IMPLANT
ADPR TBG 2 SPK PMP STRL ASCP (ADAPTER) ×2
BAG DRN LRG CPC RND TRDRP CNTR (MISCELLANEOUS)
BAG DRN RND TRDRP ANRFLXCHMBR (UROLOGICAL SUPPLIES)
BAG URINE DRAIN 2000ML AR STRL (UROLOGICAL SUPPLIES) IMPLANT
BAG URO DRAIN 4000ML (MISCELLANEOUS) IMPLANT
CATH FOL 2WAY LX 20X30 (CATHETERS) IMPLANT
CATH FOL 2WAY LX 22X30 (CATHETERS) IMPLANT
CATH FOLEY 3WAY 30CC 22FR (CATHETERS) IMPLANT
CATH URETL OPEN 5X70 (CATHETERS) ×2 IMPLANT
CONTAINER COLLECT MORCELLATR (MISCELLANEOUS) ×1 IMPLANT
DRAPE 3/4 80X56 (DRAPES) ×2 IMPLANT
DRAPE UTILITY 15X26 TOWEL STRL (DRAPES) IMPLANT
FIBER LASER FLEXIVA PULSE 550 (Laser) ×2 IMPLANT
FILTER OVERFLOW MORCELLATOR (FILTER) ×1 IMPLANT
GAUZE 4X4 16PLY ~~LOC~~+RFID DBL (SPONGE) ×4 IMPLANT
GLOVE SURG ENC MOIS LTX SZ6.5 (GLOVE) ×4 IMPLANT
GOWN STRL REUS W/ TWL LRG LVL3 (GOWN DISPOSABLE) ×2 IMPLANT
GOWN STRL REUS W/TWL LRG LVL3 (GOWN DISPOSABLE) ×4
HOLDER FOLEY CATH W/STRAP (MISCELLANEOUS) ×2 IMPLANT
IV NS IRRIG 3000ML ARTHROMATIC (IV SOLUTION) ×8 IMPLANT
KIT TURNOVER CYSTO (KITS) ×2 IMPLANT
MBRN O SEALING YLW 17 FOR INST (MISCELLANEOUS) ×2
MEMBRANE SLNG YLW 17 FOR INST (MISCELLANEOUS) ×1 IMPLANT
MORCELLATOR COLLECT CONTAINER (MISCELLANEOUS) ×2
MORCELLATOR OVERFLOW FILTER (FILTER) ×2
MORCELLATOR ROTATION 4.75 335 (MISCELLANEOUS) ×2 IMPLANT
PACK CYSTO AR (MISCELLANEOUS) ×2 IMPLANT
SET CYSTO W/LG BORE CLAMP LF (SET/KITS/TRAYS/PACK) IMPLANT
SET IRRIG Y TYPE TUR BLADDER L (SET/KITS/TRAYS/PACK) ×2 IMPLANT
SLEEVE PROTECTION STRL DISP (MISCELLANEOUS) ×4 IMPLANT
SYR TOOMEY IRRIG 70ML (MISCELLANEOUS) ×2
SYRINGE TOOMEY IRRIG 70ML (MISCELLANEOUS) ×1 IMPLANT
TUBE PUMP MORCELLATOR PIRANHA (TUBING) ×2 IMPLANT
WATER STERILE IRR 1000ML POUR (IV SOLUTION) ×2 IMPLANT
WATER STERILE IRR 500ML POUR (IV SOLUTION) ×2 IMPLANT

## 2021-11-27 NOTE — Anesthesia Procedure Notes (Signed)
Procedure Name: Intubation Date/Time: 11/27/2021 9:22 AM Performed by: Esaw Grandchild, CRNA Pre-anesthesia Checklist: Patient identified, Emergency Drugs available, Suction available and Patient being monitored Patient Re-evaluated:Patient Re-evaluated prior to induction Oxygen Delivery Method: Circle system utilized Preoxygenation: Pre-oxygenation with 100% oxygen Induction Type: IV induction Ventilation: Mask ventilation without difficulty Laryngoscope Size: Miller and 2 Grade View: Grade I Tube type: Oral Tube size: 7.5 mm Number of attempts: 1 Airway Equipment and Method: Stylet, Oral airway and Bite block Placement Confirmation: ETT inserted through vocal cords under direct vision, positive ETCO2 and breath sounds checked- equal and bilateral Secured at: 24 cm Tube secured with: Tape Dental Injury: Teeth and Oropharynx as per pre-operative assessment

## 2021-11-27 NOTE — H&P (Signed)
Hollice Espy, MD [facility]  H&P updated today, no changes   Christian Cole [address] [facility] 1951-05-05 [id number]   Referring provider:  [name], Nino Glow, MD [address] [place],  [place] [address]    Chief Complaint  Patient presents with   Follow-up        HPI: Christian Cole is a 71 y.o.male with a personal history of BPH with LUTS, who presents today for discussion on HoLEP.    He underwent a cystoscopy with Dr. Bernardo Heater on 10/09/2021. Retroflexion showed intravesical median lobe. His TRUS on 11/18/20221 revealed a prostate of 5.92 x 4.93 x 6.45 for a total volume of 98.6.    He has significant urinary symptoms including poor urinary stream, elevated PVRs in the 300 range.  He is not able to tolerate Flomax due to vasodilation.   He is doing well today. He reports that he has an implant by Dr.Figler in 2020.    He does have a significant cardiac history. PMH:     Past Medical History:  Diagnosis Date   CAD (coronary artery disease)      a. s/p CABG in 2000 with LIMA-LAD, RIMA-PDA, SVG-D1, and SVG-PLA b. low-risk NST in 2016   Cancer [facility])      skin cancer follows Dr. Kellie Moor    ED (erectile dysfunction)     GERD (gastroesophageal reflux disease)     Hyperlipidemia     Hypertension      Followed by Dr. Claiborne Billings   OSA on CPAP     Sinus bradycardia     Thrombocytopenia (HCC)     Tongue lesion      benign from trauma as of 06/20/21      Surgical History:      Past Surgical History:  Procedure Laterality Date   CATARACT EXTRACTION, BILATERAL   2010   COLONOSCOPY WITH PROPOFOL N/A 04/15/2020    Procedure: COLONOSCOPY WITH PROPOFOL;  Surgeon: Robert Bellow, MD;  Location: [facility] ENDOSCOPY;  Service: Endoscopy;  Laterality: N/A;   CORONARY ARTERY BYPASS GRAFT   2000    LIMA to the LAD,RIMA to the PDA,vein graft to the diagonal & vein graft to the PLA   ESOPHAGOGASTRODUODENOSCOPY        Dr. Gustavo Lah   ESOPHAGOGASTRODUODENOSCOPY   04/15/2020    Procedure:  ESOPHAGOGASTRODUODENOSCOPY (EGD);  Surgeon: Robert Bellow, MD;  Location: [facility] ENDOSCOPY;  Service: Endoscopy;;   KNEE ARTHROSCOPY   1991   KNEE DEBRIDEMENT   1989    Fluid flush   PENILE PROSTHESIS IMPLANT        06/26/19 Dr. Francesca Jewett   SHOULDER ARTHROSCOPY   2011   SHOULDER SURGERY   2010    bone spur    US ECHOCARDIOGRAPHY   12/12/2010    mild LA dilatation, normal LV systolic fx, EF > 19%      Home Medications:  Allergies as of 10/18/2021         Reactions    Tamsulosin Other (See Comments)    Chest discomfort, basal dilation with a warm feeling in chest             Medication List           Accurate as of October 18, 2021 11:59 PM. If you have any questions, ask your nurse or doctor.              STOP taking these medications     sulfamethoxazole-trimethoprim 800-160 MG tablet Commonly known as: BACTRIM DS Stopped  by: Hollice Espy, MD           TAKE these medications     amLODipine 10 MG tablet Commonly known as: NORVASC Take 1 tablet (10 mg total) by mouth daily.    atorvastatin 40 MG tablet Commonly known as: LIPITOR Take 1 tablet (40 mg total) by mouth daily. At night    BD SafetyGlide Needle 18G X 1-1/2" Misc Generic drug: NEEDLE (DISP) 18 G Draw up medication with this needle    benazepril 40 MG tablet Commonly known as: LOTENSIN Take 1 tablet (40 mg total) by mouth daily.    clopidogrel 75 MG tablet Commonly known as: PLAVIX Take 1 tablet (75 mg total) by mouth daily.    ezetimibe 10 MG tablet Commonly known as: ZETIA Take 1 tablet (10 mg total) by mouth daily.    pantoprazole 40 MG tablet Commonly known as: PROTONIX TAKE 1 TABLET BY MOUTH EVERY DAY 30 MINUTES BEFORE A MEAL    silodosin 8 MG Caps capsule Commonly known as: RAPAFLO Take 1 capsule (8 mg total) by mouth daily with breakfast.    SYRINGE-NEEDLE (DISP) 3 ML 23G X 1-1/2" 3 ML Misc Use as directed QOWEEK    testosterone cypionate 200 MG/ML injection Commonly known  as: DEPOTESTOSTERONE CYPIONATE ADMINISTER 1.5 ML(300 MG) IN THE MUSCLE EVERY 14 DAYS             Allergies:       Allergies  Allergen Reactions   Tamsulosin Other (See Comments)      Chest discomfort, basal dilation with a warm feeling in chest       Family History:      Family History  Problem Relation Age of Onset   Arthritis Mother     Heart disease Father          CAD - MI   Stroke Father     Hypertension Father     Cancer Father 63        esophageal cancer    Arthritis Maternal Grandmother        Social History:  reports that he has been smoking cigarettes. He has a 22.00 pack-year smoking history. He has never used smokeless tobacco. He reports current alcohol use of about 10.0 standard drinks per week. He reports that he does not use drugs.     Physical Exam: BP (!) 151/74    Pulse (!) 51    Ht 5\' 11"  (1.803 m)    Wt 214 lb (97.1 kg)    BMI 29.85 kg/m   Constitutional:  Alert and oriented, No acute distress. HEENT: [place] AT, moist mucus membranes.  Trachea midline, no masses. Cardiovascular: No clubbing, cyanosis, or edema. Respiratory: Normal respiratory effort, no increased work of breathing. Skin: No rashes, bruises or suspicious lesions. Neurologic: Grossly intact, no focal deficits, moving all 4 extremities. Psychiatric: Normal mood and affect.   Laboratory Data:   Recent Labs       Lab Results  Component Value Date    CREATININE 1.06 08/30/2021        Recent Labs       Lab Results  Component Value Date    PSA 1.57 12/03/2018    PSA 0.96 12/03/2017    PSA 0.69 01/06/2014        Recent Labs       Lab Results  Component Value Date    TESTOSTERONE 753 08/04/2021        Recent Labs  Lab Results  Component Value Date    HGBA1C 5.9 06/20/2021        Assessment & Plan:     BPH with LUTS  - We discussed alternatives including TURP vs. holmium laser enucleation of the prostate vs. greenlight laser ablation. Differences between the  surgical procedures were discussed as well as the risks and benefits of each.  He is not a candidate for UroLift due to the presence of the median lobe and overall volume of his prostate.   We discussed the common postoperative course following holep including need for overnight Foley catheter, temporary worsening of irritative voiding symptoms, and occasional stress incontinence which typically lasts up to 6 months but can persist.  We discussed retrograde ejaculation and damage to surrounding structures including the urinary sphincter. Other uncommon complications including hematuria and urinary tract infection.   He understands all of the above and is willing to proceed as planned.   He will need cardiac clearance.  He is on Plavix.  He has been able to stop this for other procedures in the past.   - Urine sent for pre-op culture   Return for HoLEP   Conley Rolls as a scribe for Hollice Espy, MD.,have documented all relevant documentation on the behalf of Hollice Espy, MD,as directed by  Hollice Espy, MD while in the presence of Hollice Espy, [place] [address], [place], [place] [address] [phone]

## 2021-11-27 NOTE — Discharge Instructions (Addendum)
I would like you to continue to hold your Plavix until your urine is clear.    Holmium Laser Enucleation of the Prostate (HoLEP)  HoLEP is a treatment for men with benign prostatic hyperplasia (BPH). The laser surgery removed blockages of urine flow, and is done without any incisions on the body.     What is HoLEP?  HoLEP is a type of laser surgery used to treat obstruction (blockage) of urine flow as a result of benign prostatic hyperplasia (BPH). In men with BPH, the prostate gland is not cancerous, but has become enlarged. An enlarged prostate can result in a number of urinary tract symptoms such as weak urinary stream, difficulty in starting urination, inability to urinate, frequent urination, or getting up at night to urinate.  HoLEP was developed in the 1990's as a more effective and less expensive surgical option for BPH, compared to other surgical options such as laser vaporization(PVP/greenlight laser), transurethral resection of the prostate(TURP), and open simple prostatectomy.   What happens during a HoLEP?  HoLEP requires general anesthesia (asleep throughout the procedure).   An antibiotic is given to reduce the risk of infection  A surgical instrument called a resectoscope is inserted through the urethra (the tube that carries urine from the bladder). The resectoscope has a camera that allows the surgeon to view the internal structure of the prostate gland, and to see where the incisions are being made during surgery.  The laser is inserted into the resectoscope and is used to enucleate (free up) the enlarged prostate tissue from the capsule (outer shell) and then to seal up any blood vessels. The tissue that has been removed is pushed back into the bladder.  A morcellator is placed through the resectoscope, and is used to suction out the prostate tissue that has been pushed into the bladder.  When the prostate tissue has been removed, the resectoscope is removed, and a  foley catheter is placed to allow healing and drain the urine from the bladder.     What happens after a HoLEP?  More than 90% of patients go home the same day a few hours after surgery. Less than 10% will be admitted to the hospital overnight for observation to monitor the urine, or if they have other medical problems.  Fluid is flushed through the catheter for about 1 hour after surgery to clear any blood from the urine. It is normal to have some blood in the urine after surgery. The need for blood transfusion is extremely rare.  Eating and drinking are permitted after the procedure once the patient has fully awakened from anesthesia.  The catheter is usually removed 2-3 days after surgery- the patient will come to clinic to have the catheter removed and make sure they can urinate on their own.  It is very important to drink lots of fluids after surgery for one week to keep the bladder flushed.  At first, there may be some burning with urination, but this typically improved within a few hours to days. Most patients do not have a significant amount of pain, and narcotic pain medications are rarely needed.  Symptoms of urinary frequency, urgency, and even leakage are NORMAL for the first few weeks after surgery as the bladder adjusts after having to work hard against blockage from the prostate for many years. This will improve, but can sometimes take several months.  The use of pelvic floor exercises (Kegel exercises) can help improve problems with urinary incontinence.   After catheter removal,  patients will be seen at 6 weeks and 6 months for symptom check  No heavy lifting for at least 2-3 weeks after surgery, however patients can walk and do light activities the first day after surgery. Return to work time depends on occupation.    What are the advantages of HoLEP?  HoLEP has been studied in many different parts of the world and has been shown to be a safe and effective procedure.  Although there are many types of BPH surgeries available, HoLEP offers a unique advantage in being able to remove a large amount of tissue without any incisions on the body, even in very large prostates, while decreasing the risk of bleeding and providing tissue for pathology (to look for cancer). This decreases the need for blood transfusions during surgery, minimizes hospital stay, and reduces the risk of needing repeat treatment.  What are the side effects of HoLEP?  Temporary burning and bleeding during urination. Some blood may be seen in the urine for weeks after surgery and is part of the healing process.  Urinary incontinence (inability to control urine flow) is expected in all patients immediately after surgery and they should wear pads for the first few days/weeks. This typically improves over the course of several weeks. Performing Kegel exercises can help decrease leakage from stress maneuvers such as coughing, sneezing, or lifting. The rate of long term leakage is very low. Patients may also have leakage with urgency and this may be treated with medication. The risk of urge incontinence can be dependent on several factors including age, prostate size, symptoms, and other medical problems.  Retrograde ejaculation or backwards ejaculation. In 75% of cases, the patient will not see any fluid during ejaculation after surgery.  Erectile function is generally not significantly affected.   What are the risks of HoLEP?  Injury to the urethra or development of scar tissue at a later date  Injury to the capsule of the prostate (typically treated with longer catheterization).  Injury to the bladder or ureteral orifices (where the urine from the kidney drains out)  Infection of the bladder, testes, or kidneys  Return of urinary obstruction at a later date requiring another operation (<2%)  Need for blood transfusion or re-operation due to bleeding  Failure to relieve all symptoms and/or  need for prolonged catheterization after surgery  5-15% of patients are found to have previously undiagnosed prostate cancer in their specimen. Prostate cancer can be treated after HoLEP.  Standard risks of anesthesia including blood clots, heart attacks, etc  When should I call my doctor?  Fever over 101.3 degrees  Inability to urinate, or large blood clots in the urine  AMBULATORY SURGERY  DISCHARGE INSTRUCTIONS   The drugs that you were given will stay in your system until tomorrow so for the next 24 hours you should not:  Drive an automobile Make any legal decisions Drink any alcoholic beverage   You may resume regular meals tomorrow.  Today it is better to start with liquids and gradually work up to solid foods.  You may eat anything you prefer, but it is better to start with liquids, then soup and crackers, and gradually work up to solid foods.   Please notify your doctor immediately if you have any unusual bleeding, trouble breathing, redness and pain at the surgery site, drainage, fever, or pain not relieved by medication.

## 2021-11-27 NOTE — Transfer of Care (Signed)
Immediate Anesthesia Transfer of Care Note  Patient: DEKOTA SHENK  Procedure(s) Performed: HOLEP-LASER ENUCLEATION OF THE PROSTATE WITH MORCELLATION  Patient Location: PACU  Anesthesia Type:General  Level of Consciousness: sedated  Airway & Oxygen Therapy: Patient Spontanous Breathing and Patient connected to face mask oxygen  Post-op Assessment: Report given to RN and Post -op Vital signs reviewed and stable  Post vital signs: Reviewed  Last Vitals:  Vitals Value Taken Time  BP    Temp    Pulse 78 11/27/21 1128  Resp 19 11/27/21 1128  SpO2 98 % 11/27/21 1128  Vitals shown include unvalidated device data.  Last Pain:  Vitals:   11/27/21 0757  TempSrc: Temporal  PainSc: 0-No pain      Patients Stated Pain Goal: 0 (71/29/29 0903)  Complications: No notable events documented.

## 2021-11-27 NOTE — Anesthesia Preprocedure Evaluation (Signed)
Anesthesia Evaluation  Patient identified by MRN, date of birth, ID band Patient awake    Reviewed: Allergy & Precautions, NPO status , Patient's Chart, lab work & pertinent test results  Airway Mallampati: II  TM Distance: >3 FB Neck ROM: full    Dental  (+) Edentulous Upper, Partial Lower   Pulmonary neg pulmonary ROS, sleep apnea and Continuous Positive Airway Pressure Ventilation , COPD,  COPD inhaler, Current Smoker and Patient abstained from smoking.,    Pulmonary exam normal  + decreased breath sounds      Cardiovascular Exercise Tolerance: Good hypertension, Pt. on medications + CAD and + CABG  negative cardio ROS Normal cardiovascular exam Rhythm:Regular Rate:Normal     Neuro/Psych negative neurological ROS  negative psych ROS   GI/Hepatic negative GI ROS, Neg liver ROS, GERD  ,  Endo/Other  negative endocrine ROS  Renal/GU   negative genitourinary   Musculoskeletal negative musculoskeletal ROS (+)   Abdominal (+) + obese,   Peds negative pediatric ROS (+)  Hematology negative hematology ROS (+)   Anesthesia Other Findings Past Medical History: No date: CAD (coronary artery disease)     Comment:  a. s/p CABG in 2000 with LIMA-LAD, RIMA-PDA, SVG-D1, and              SVG-PLA b. low-risk NST in 2016 No date: Cancer ([place])     Comment:  skin cancer follows Dr. Kellie Moor  No date: ED (erectile dysfunction) No date: GERD (gastroesophageal reflux disease) No date: Hyperlipidemia No date: Hypertension     Comment:  Followed by Dr. Claiborne Billings No date: OSA on CPAP No date: Sinus bradycardia No date: Thrombocytopenia ([place]) No date: Tongue lesion     Comment:  benign from trauma as of 06/20/21  Past Surgical History: 2010: CATARACT EXTRACTION, BILATERAL 04/15/2020: COLONOSCOPY WITH PROPOFOL; N/A     Comment:  Procedure: COLONOSCOPY WITH PROPOFOL;  Surgeon: Robert Bellow, MD;  Location: [facility]  ENDOSCOPY;  Service:               Endoscopy;  Laterality: N/A; 2000: CORONARY ARTERY BYPASS GRAFT     Comment:  LIMA to the LAD,RIMA to the PDA,vein graft to the               diagonal & vein graft to the PLA No date: ESOPHAGOGASTRODUODENOSCOPY     Comment:  Dr. Gustavo Lah 04/15/2020: ESOPHAGOGASTRODUODENOSCOPY     Comment:  Procedure: ESOPHAGOGASTRODUODENOSCOPY (EGD);  Surgeon:               Robert Bellow, MD;  Location: [facility] ENDOSCOPY;                Service: Endoscopy;; 1991: KNEE ARTHROSCOPY; Right 1989: KNEE DEBRIDEMENT; Right     Comment:  Fluid flush No date: PENILE PROSTHESIS IMPLANT     Comment:  06/26/19 Dr. Francesca Jewett 2011: SHOULDER ARTHROSCOPY; Left 12/12/2010: US ECHOCARDIOGRAPHY     Comment:  mild LA dilatation, normal LV systolic fx, EF > 21%  BMI    Body Mass Index: 30.19 kg/m      Reproductive/Obstetrics negative OB ROS                             Anesthesia Physical Anesthesia Plan  ASA: 3  Anesthesia Plan: General   Post-op Pain Management:    Induction: Intravenous  PONV Risk Score and Plan:  Ondansetron, Dexamethasone, Midazolam and Treatment may vary due to age or medical condition  Airway Management Planned: Oral ETT  Additional Equipment:   Intra-op Plan:   Post-operative Plan: Extubation in OR  Informed Consent: I have reviewed the patients History and Physical, chart, labs and discussed the procedure including the risks, benefits and alternatives for the proposed anesthesia with the patient or authorized representative who has indicated his/her understanding and acceptance.     Dental Advisory Given  Plan Discussed with: CRNA and Surgeon  Anesthesia Plan Comments:         Anesthesia Quick Evaluation

## 2021-11-27 NOTE — Op Note (Signed)
Date of procedure: 11/27/21  Preoperative diagnosis:  BPH with BOO  Postoperative diagnosis:  same   Procedure: HoLEP with morcellation  Surgeon: Hollice Espy, MD  Anesthesia: General  Complications: None  Intraoperative findings: Trilobar coaptation with elevated bladder neck.  Penile prosthesis deflated during procedure.  Small bladder mucosal injury left anterior, no concern for perforation.  EBL: Minimal  Specimens: None  Drains: 20 French two-way Foley catheter with 50 cc in the balloon  Indication: Christian Cole is a 71 y.o. patient with severe BPH and incomplete bladder emptying.  After reviewing the management options for treatment, he elected to proceed with the above surgical procedure(s). We have discussed the potential benefits and risks of the procedure, side effects of the proposed treatment, the likelihood of the patient achieving the goals of the procedure, and any potential problems that might occur during the procedure or recuperation. Informed consent has been obtained.  Description of procedure:  The patient was taken to the operating room and general anesthesia was induced.  The patient was placed in the dorsal lithotomy position, prepped and draped in the usual sterile fashion, and preoperative antibiotics were administered. A preoperative time-out was performed.     A 26 French resectoscope sheath using a blunt angled obturator was introduced without difficulty into the bladder.  The bladder was carefully inspected and noted to be moderately trabeculated.  There is an elevated bladder neck with a very small intravesical component.  The trigone was able to be visualized with some manipulation and the UOs were elevated bladder neck itself.  The prostatic fossa had significant trilobar coaptation with greater than 5 cm prostatic length.  A 550 m laser fiber was then brought in and using settings of 1 J's and 60 Hz, 2 incisions were created at the 5:00 and  7:00 positions of the bladder neck on either side of the median lobe down to the level of the bladder neck/capsular fibers.  The incision was carried down caudally meeting in the midline just above the verumontanum.  The median lobe was then enucleated from a caudal to cranial direction cleaving the adenoma off the underlying capsule rolling it towards the bladder neck and ultimately cleaving the mucosa to free the median lobe into the bladder.   Next, a semilunar incision was created at the prostatic apex on the left side again freeing up the adenoma from the underlying capsule.  Care was taken to avoid any resection past the verumontanum.  This incision was carried around laterally and cranially towards the bladder neck.  Ultimately, I was able to complete the anterior commissure mucosa and the adenoma into the bladder creating a widely patent prostatic fossa.     Next, the same similar incision was created at the right prostatic apex.  This adenoma however ended up being enucleated and more of a piece wise fashion freeing up a large BPH nodules from the capsular fibers.  Once this was completed and cleared from the bladder neck, the prostatic fossa was noted to be widely patent.  Hemostasis was achieved using hemostatic fiber settings.  Bilateral UOs were visualized and free of any injury.  Finally, the 49 French resectoscope was exchanged for nephroscope and using the Piranha handpiece morcellator, the bladder was distended in each of the prostate chips were evacuated.  The bladder was irrigated several times and smaller joules were clear for the bladder.  This point time, there were no residual fibers appreciated in the bladder.  Hemostasis was adequate.  I  did have some difficulty reaching some of the pieces due to the presence of his penile prosthesis.  I do feel empty his bladder several times.  On 1 occasion, did grab a small amount of mucosa at the left anterior bladder wall however was treated only a  small superficial defect in the mucosa only without concern for perforation.  Filled and emptied nicely thereafter. 10 mg of IV Lasix was administered to help with postoperative diuresis.  A 20 French two-way Foley catheter was then inserted over a catheter guide with 50 cc in the balloon.  The catheter irrigated easily and well.  Patient was then clean and dry, repositioned supine position, reversed from anesthesia, taken to PACU in stable condition.   Plan: Patient will return to the office in 2 days for foley removal.     Hollice Espy, M.D.

## 2021-11-28 ENCOUNTER — Encounter: Payer: Self-pay | Admitting: Urology

## 2021-11-28 ENCOUNTER — Emergency Department
Admission: EM | Admit: 2021-11-28 | Discharge: 2021-11-28 | Disposition: A | Discharge status: Home / Self Care | Payer: Medicare Other | Attending: Emergency Medicine | Admitting: Emergency Medicine

## 2021-11-28 ENCOUNTER — Other Ambulatory Visit: Payer: Self-pay

## 2021-11-28 DIAGNOSIS — Z20822 Contact with and (suspected) exposure to covid-19: Secondary | ICD-10-CM | POA: Insufficient documentation

## 2021-11-28 DIAGNOSIS — Z85828 Personal history of other malignant neoplasm of skin: Secondary | ICD-10-CM | POA: Insufficient documentation

## 2021-11-28 DIAGNOSIS — R509 Fever, unspecified: Secondary | ICD-10-CM | POA: Diagnosis present

## 2021-11-28 DIAGNOSIS — I10 Essential (primary) hypertension: Secondary | ICD-10-CM | POA: Diagnosis not present

## 2021-11-28 DIAGNOSIS — N390 Urinary tract infection, site not specified: Secondary | ICD-10-CM | POA: Insufficient documentation

## 2021-11-28 DIAGNOSIS — I2510 Atherosclerotic heart disease of native coronary artery without angina pectoris: Secondary | ICD-10-CM | POA: Diagnosis not present

## 2021-11-28 DIAGNOSIS — Z951 Presence of aortocoronary bypass graft: Secondary | ICD-10-CM | POA: Insufficient documentation

## 2021-11-28 LAB — COMPREHENSIVE METABOLIC PANEL
ALT: 23 U/L (ref 0–44)
AST: 24 U/L (ref 15–41)
Albumin: 4 g/dL (ref 3.5–5.0)
Alkaline Phosphatase: 67 U/L (ref 38–126)
Anion gap: 10 (ref 5–15)
BUN: 15 mg/dL (ref 8–23)
CO2: 22 mmol/L (ref 22–32)
Calcium: 8.8 mg/dL — ABNORMAL LOW (ref 8.9–10.3)
Chloride: 102 mmol/L (ref 98–111)
Creatinine, Ser: 0.97 mg/dL (ref 0.61–1.24)
GFR, Estimated: >60 mL/min (ref >60)
Glucose, Bld: 99 mg/dL (ref 70–99)
Potassium: 3.7 mmol/L (ref 3.5–5.1)
Sodium: 134 mmol/L — ABNORMAL LOW (ref 135–145)
Total Bilirubin: 0.8 mg/dL (ref 0.3–1.2)
Total Protein: 6.9 g/dL (ref 6.5–8.1)

## 2021-11-28 LAB — CBC WITH DIFFERENTIAL/PLATELET
Abs Immature Granulocytes: 0.06 10*3/uL (ref 0.00–0.07)
Basophils Absolute: 0 10*3/uL (ref 0.0–0.1)
Basophils Relative: 0 %
Eosinophils Absolute: 0 10*3/uL (ref 0.0–0.5)
Eosinophils Relative: 0 %
HCT: 47.1 % (ref 39.0–52.0)
Hemoglobin: 16 g/dL (ref 13.0–17.0)
Immature Granulocytes: 0 %
Lymphocytes Relative: 14 %
Lymphs Abs: 1.9 10*3/uL (ref 0.7–4.0)
MCH: 31.2 pg (ref 26.0–34.0)
MCHC: 34 g/dL (ref 30.0–36.0)
MCV: 91.8 fL (ref 80.0–100.0)
Monocytes Absolute: 1.7 10*3/uL — ABNORMAL HIGH (ref 0.1–1.0)
Monocytes Relative: 13 %
Neutro Abs: 9.8 10*3/uL — ABNORMAL HIGH (ref 1.7–7.7)
Neutrophils Relative %: 73 %
Platelets: 115 10*3/uL — ABNORMAL LOW (ref 150–400)
RBC: 5.13 MIL/uL (ref 4.22–5.81)
RDW: 15 % (ref 11.5–15.5)
WBC: 13.5 10*3/uL — ABNORMAL HIGH (ref 4.0–10.5)
nRBC: 0 % (ref 0.0–0.2)

## 2021-11-28 LAB — URINALYSIS, ROUTINE W REFLEX MICROSCOPIC
Bilirubin Urine: NEGATIVE
Glucose, UA: NEGATIVE mg/dL
Ketones, ur: NEGATIVE mg/dL
Nitrite: NEGATIVE
Protein, ur: 30 mg/dL — AB
Specific Gravity, Urine: 1.005 (ref 1.005–1.030)
Squamous Epithelial / HPF: NONE SEEN (ref 0–5)
pH: 7 (ref 5.0–8.0)

## 2021-11-28 LAB — RESP PANEL BY RT-PCR (FLU A&B, COVID) ARPGX2
Influenza A by PCR: NEGATIVE
Influenza B by PCR: NEGATIVE
SARS Coronavirus 2 by RT PCR: NEGATIVE

## 2021-11-28 LAB — SURGICAL PATHOLOGY

## 2021-11-28 LAB — LACTIC ACID, PLASMA: Lactic Acid, Venous: 1.3 mmol/L (ref 0.5–1.9)

## 2021-11-28 MED ORDER — CEFTRIAXONE SODIUM 1 G IJ SOLR
1.0000 g | Freq: Once | INTRAMUSCULAR | Status: AC
  Administered 2021-11-28: 1 g via INTRAVENOUS
  Filled 2021-11-28: qty 10

## 2021-11-28 MED ORDER — SODIUM CHLORIDE 0.9 % IV BOLUS
1000.0000 mL | Freq: Once | INTRAVENOUS | Status: AC
  Administered 2021-11-28: 1000 mL via INTRAVENOUS

## 2021-11-28 MED ORDER — CIPROFLOXACIN HCL 500 MG PO TABS: 500.0000 mg | ORAL_TABLET | Freq: Two times a day (BID) | ORAL | 0 refills | Status: AC

## 2021-11-28 NOTE — ED Notes (Signed)
ED Provider at bedside. 

## 2021-11-28 NOTE — ED Provider Notes (Signed)
[facility] Provider Note    Event Date/Time   First MD Initiated Contact with Patient 11/28/21 2036     (approximate)   History   Fever and Chills   HPI  Christian Cole is a 71 y.o. male with past medical history of coronary disease, BPH status post HoLEP procedure who presents with fever and chills.  Patient had a HoLEP procedure for his BPH on 1/16 with Dr. Erlene Quan.  He was feeling well until today when he started to have chills and a fever.  Has had some fatigue as well.  Denies abdominal pain.  He has a Foley in place, no dysuria.  He denies nausea vomiting diarrhea.  Mildly constipated.  No cough or shortness of breath.  Past Medical History:  Diagnosis Date   CAD (coronary artery disease)    a. s/p CABG in 2000 with LIMA-LAD, RIMA-PDA, SVG-D1, and SVG-PLA b. low-risk NST in 2016   Cancer [facility])    skin cancer follows Dr. Kellie Moor    ED (erectile dysfunction)    GERD (gastroesophageal reflux disease)    Hyperlipidemia    Hypertension    Followed by Dr. Claiborne Billings   OSA on CPAP    Sinus bradycardia    Thrombocytopenia ([place])    Tongue lesion    benign from trauma as of 06/20/21    Patient Active Problem List   Diagnosis Date Noted   Low testosterone in male 06/20/2021   Erythrocytosis 12/28/2020   Gastroesophageal reflux disease 12/21/2020   Hypogonadism in male 09/18/2020   Obesity (BMI 30-39.9) 06/15/2020   Cyst of right kidney 06/15/2020   Vitamin D deficiency 12/16/2019   Carotid artery stenosis 06/11/2019   Prediabetes 07/15/2018   Fatty liver 02/05/2018   History of prediabetes 12/04/2017   Thrombocytopenia ([place]) 12/04/2017   History of skin cancer 12/04/2017   Sinus bradycardia 08/26/2017   Tobacco use 08/26/2017   OSA (obstructive sleep apnea) 05/03/2017   Preventative health care 05/03/2016   CAD (coronary artery disease) 02/04/2014   HLD (hyperlipidemia) 01/06/2014   HTN (hypertension) 01/06/2014   ED (erectile dysfunction)  01/06/2014     Physical Exam  Triage Vital Signs: ED Triage Vitals  Enc Vitals Group     BP 11/28/21 1852 129/75     Pulse Rate 11/28/21 1852 96     Resp 11/28/21 1852 18     Temp 11/28/21 1852 100.2 F (37.9 C)     Temp Source 11/28/21 1852 Oral     SpO2 11/28/21 1852 99 %     Weight 11/28/21 1926 216 lb 7.9 oz (98.2 kg)     Height 11/28/21 1926 5\' 11"  (1.803 m)     Head Circumference --      Peak Flow --      Pain Score --      Pain Loc --      Pain Edu? --      Excl. in [place]? --     Most recent vital signs: Vitals:   11/28/21 2145 11/28/21 2230  BP:  125/79  Pulse: 82 86  Resp: 18 18  Temp:  100.1 F (37.8 C)  SpO2: 95% 95%     General: Awake, no distress.  CV:  Good peripheral perfusion.  Resp:  Normal effort.  Abd:  No distention.  Abdomen is soft nontender throughout Neuro:             Awake, Alert, Oriented x 3  Other:  ED Results / Procedures / Treatments  Labs (all labs ordered are listed, but only abnormal results are displayed) Labs Reviewed  COMPREHENSIVE METABOLIC PANEL - Abnormal; Notable for the following components:      Result Value   Sodium 134 (*)    Calcium 8.8 (*)    All other components within normal limits  CBC WITH DIFFERENTIAL/PLATELET - Abnormal; Notable for the following components:   WBC 13.5 (*)    Platelets 115 (*)    Neutro Abs 9.8 (*)    Monocytes Absolute 1.7 (*)    All other components within normal limits  URINALYSIS, ROUTINE W REFLEX MICROSCOPIC - Abnormal; Notable for the following components:   Color, Urine YELLOW (*)    APPearance CLEAR (*)    Hgb urine dipstick LARGE (*)    Protein, ur 30 (*)    Leukocytes,Ua LARGE (*)    Bacteria, UA RARE (*)    All other components within normal limits  RESP PANEL BY RT-PCR (FLU A&B, COVID) ARPGX2  URINE CULTURE  LACTIC ACID, PLASMA     EKG     RADIOLOGY    PROCEDURES:  Critical Care performed: No  .1-3 Lead EKG Interpretation Performed by: Rada Hay, MD Authorized by: Rada Hay, MD     Interpretation: normal     ECG rate assessment: normal     Rhythm: sinus rhythm     Ectopy: none     Conduction: normal    The patient is on the cardiac monitor to evaluate for evidence of arrhythmia and/or significant heart rate changes.   MEDICATIONS ORDERED IN ED: Medications  cefTRIAXone (ROCEPHIN) 1 g in sodium chloride 0.9 % 100 mL IVPB (0 g Intravenous Stopped 11/28/21 2229)  sodium chloride 0.9 % bolus 1,000 mL (0 mLs Intravenous Stopped 11/28/21 2236)     IMPRESSION / MDM / ASSESSMENT AND PLAN / ED COURSE  I reviewed the triage vital signs and the nursing notes.                              Differential diagnosis includes, but is not limited to, UTI, sepsis, bacteremia, viral syndrome  Patient is a 71 year old male who recently underwent a procedure on his prostate for BPH who presents with fever and chills.  Temp of 100.8 here, vital signs otherwise within normal limits.  He has a mild leukocytosis to 13.5, renal function is at baseline.  UA does have significant RBCs and WBCs although he does have a Foley in place as well.  Lactate is normal.  Patient otherwise has no other viral symptoms or GI symptoms.  I suspect that the UA is a real UTI causing his symptoms especially with the recent manipulation.Spoke with Dr. Bernardo Heater who is that sepsis and bacteremia is relatively rare after this type of procedure.  Recommends a dose of Rocephin and then discharged on a fluoroquinolone if patient is otherwise well with stable vital signs tolerating p.o. centered admission for IV antibiotics however with the patient otherwise appearing well and with good discharge plan and patient wanting to be discharged I think it is appropriate.  Will send home with prescription for fluoroquinolone.  We will have urology follow-up tomorrow   FINAL CLINICAL IMPRESSION(S) / ED DIAGNOSES   Final diagnoses:  Urinary tract infection with hematuria, site  unspecified     Rx / DC Orders   ED Discharge Orders  Ordered    ciprofloxacin (CIPRO) 500 MG tablet  2 times daily        11/28/21 2236             Note:  This document was prepared using Dragon voice recognition software and may include unintentional dictation errors.   Rada Hay, MD 11/29/21 [phone]

## 2021-11-28 NOTE — Discharge Instructions (Addendum)
Your fever and chills is likely from a urine infection.  Please take the ciprofloxacin twice a day for the next 5 days.  If you develop worsening fever or are unable to eat or drink or developing confusion or any other new symptoms that are concerning to you, do not hesitate to return to the emergency department.  Please follow-up with your urologist tomorrow.

## 2021-11-28 NOTE — ED Triage Notes (Signed)
Pt to ED via POV with c/o having a chill today, he just had prostate surgery yesterday, they called the nurse at the office and they told him to come in to make sure he is not getting an infection

## 2021-11-30 ENCOUNTER — Ambulatory Visit (INDEPENDENT_AMBULATORY_CARE_PROVIDER_SITE_OTHER): Payer: Medicare Other | Admitting: Urology

## 2021-11-30 ENCOUNTER — Encounter: Payer: Self-pay | Admitting: Oncology

## 2021-11-30 ENCOUNTER — Other Ambulatory Visit: Payer: Self-pay

## 2021-11-30 DIAGNOSIS — N138 Other obstructive and reflux uropathy: Secondary | ICD-10-CM

## 2021-11-30 DIAGNOSIS — R339 Retention of urine, unspecified: Secondary | ICD-10-CM

## 2021-11-30 DIAGNOSIS — N401 Enlarged prostate with lower urinary tract symptoms: Secondary | ICD-10-CM

## 2021-11-30 NOTE — Progress Notes (Signed)
Catheter Removal  Patient is present today for a catheter removal.  45 ml of water was drained from the balloon. A 20 FR foley cath was removed from the bladder no complications were noted . Patient tolerated well.  Performed by: Zara Council, PA-C   Follow up/ Additional notes:  Explained to the patient that since we had removed the Foley, the tamponade that was provided by the Foley catheter balloon is no longer present and he should experience gross hematuria off and on for the next few days.  Advised him to contact us if the urine becomes thick, he cannot see through it, or it is persistent.

## 2021-12-01 ENCOUNTER — Emergency Department
Admission: EM | Admit: 2021-12-01 | Discharge: 2021-12-01 | Disposition: A | Discharge status: Home / Self Care | Payer: Medicare Other | Attending: Emergency Medicine | Admitting: Emergency Medicine

## 2021-12-01 ENCOUNTER — Encounter: Payer: Self-pay | Admitting: Oncology

## 2021-12-01 DIAGNOSIS — R3911 Hesitancy of micturition: Secondary | ICD-10-CM | POA: Insufficient documentation

## 2021-12-01 DIAGNOSIS — R339 Retention of urine, unspecified: Secondary | ICD-10-CM | POA: Diagnosis present

## 2021-12-01 LAB — CBC WITH DIFFERENTIAL/PLATELET
Abs Immature Granulocytes: 0.1 K/uL — ABNORMAL HIGH (ref 0.00–0.07)
Basophils Absolute: 0 K/uL (ref 0.0–0.1)
Basophils Relative: 0 %
Eosinophils Absolute: 0 K/uL (ref 0.0–0.5)
Eosinophils Relative: 0 %
HCT: 44.1 % (ref 39.0–52.0)
Hemoglobin: 14.8 g/dL (ref 13.0–17.0)
Immature Granulocytes: 1 %
Lymphocytes Relative: 9 %
Lymphs Abs: 1.3 K/uL (ref 0.7–4.0)
MCH: 31 pg (ref 26.0–34.0)
MCHC: 33.6 g/dL (ref 30.0–36.0)
MCV: 92.3 fL (ref 80.0–100.0)
Monocytes Absolute: 1.8 K/uL — ABNORMAL HIGH (ref 0.1–1.0)
Monocytes Relative: 12 %
Neutro Abs: 11.2 K/uL — ABNORMAL HIGH (ref 1.7–7.7)
Neutrophils Relative %: 78 %
Platelets: 96 K/uL — ABNORMAL LOW (ref 150–400)
RBC: 4.78 MIL/uL (ref 4.22–5.81)
RDW: 15.1 % (ref 11.5–15.5)
WBC: 14.5 K/uL — ABNORMAL HIGH (ref 4.0–10.5)
nRBC: 0 % (ref 0.0–0.2)

## 2021-12-01 LAB — URINALYSIS, ROUTINE W REFLEX MICROSCOPIC
Bacteria, UA: NONE SEEN
Bilirubin Urine: NEGATIVE
Glucose, UA: NEGATIVE mg/dL
Ketones, ur: 5 mg/dL — AB
Nitrite: NEGATIVE
Protein, ur: 30 mg/dL — AB
Specific Gravity, Urine: 1.016 (ref 1.005–1.030)
Squamous Epithelial / HPF: NONE SEEN (ref 0–5)
pH: 7 (ref 5.0–8.0)

## 2021-12-01 LAB — BASIC METABOLIC PANEL
Anion gap: 6 (ref 5–15)
BUN: 20 mg/dL (ref 8–23)
CO2: 21 mmol/L — ABNORMAL LOW (ref 22–32)
Calcium: 8.2 mg/dL — ABNORMAL LOW (ref 8.9–10.3)
Chloride: 106 mmol/L (ref 98–111)
Creatinine, Ser: 1.2 mg/dL (ref 0.61–1.24)
GFR, Estimated: >60 mL/min (ref >60)
Glucose, Bld: 133 mg/dL — ABNORMAL HIGH (ref 70–99)
Potassium: 3.7 mmol/L (ref 3.5–5.1)
Sodium: 133 mmol/L — ABNORMAL LOW (ref 135–145)

## 2021-12-01 LAB — URINE CULTURE: Culture: 90000 — AB

## 2021-12-01 NOTE — ED Triage Notes (Signed)
Pt presents to ER c/o urinary retention.  Pt states he hasn't urinated since appx 1730 yesterday.  Pt had HOLEP procedure on Monday and had catheter that was removed yesterday.  Pt A&O x4 at this time.  Pt appears uncomfortable in triage.

## 2021-12-01 NOTE — ED Provider Notes (Signed)
[facility] Provider Note    Event Date/Time   First MD Initiated Contact with Patient 12/01/21 [phone]     (approximate)   History   Urinary Retention   HPI  Christian Cole is a 71 y.o. male who presents to the ED for evaluation of Urinary Retention   I review outpatient urology visit from yesterday morning.  Patient saw the PA for Foley catheter removal.  Previously, on 1/16, patient had HoLEP laser enucleation of his prostate with Dr. Erlene Quan.  Then seen the following day, 11/17 in our ED for generalized malaise and subjective fever/chills, he was diagnosed with acute cystitis and started on Cipro.  Patient returns to the ED this morning for evaluation of possible urinary retention.  He reports he does not have the urge to void, but thought it had been too long since his last void, while also admitting that he has not drank a ton of water yesterday.  In triage, he had 125 mL in his bladder, and for some reason an indwelling Foley catheter was placed.   I evaluate the patient a few hours later once he is roomed, and he has no complaints.  He self-reports that he thinks he "probably just panicked."  And acknowledges that he did not have the urge to void as well as the small volume in his bladder in triage.  He reports compliance with his Cipro, and having no systemic symptoms such as fevers, chills.  No abdominal pain or emesis.  Physical Exam   Triage Vital Signs: ED Triage Vitals  Enc Vitals Group     BP 12/01/21 0304 (!) 149/83     Pulse Rate 12/01/21 0304 91     Resp 12/01/21 0304 20     Temp 12/01/21 0304 98.9 F (37.2 C)     Temp Source 12/01/21 0304 Oral     SpO2 12/01/21 0304 99 %     Weight 12/01/21 0305 216 lb (98 kg)     Height 12/01/21 0305 5\' 11"  (1.803 m)     Head Circumference --      Peak Flow --      Pain Score 12/01/21 0304 1     Pain Loc --      Pain Edu? --      Excl. in [place]? --     Most recent vital signs: Vitals:    12/01/21 0304 12/01/21 0925  BP: (!) 149/83 110/79  Pulse: 91 78  Resp: 20 18  Temp: 98.9 F (37.2 C)   SpO2: 99% 96%    General: Awake, no distress.  CV:  Good peripheral perfusion.  Resp:  Normal effort.  Abd:  No distention.  Soft and benign throughout. MSK:  No deformity noted.  Neuro:  No focal deficits appreciated. Other:     ED Results / Procedures / Treatments   Labs (all labs ordered are listed, but only abnormal results are displayed) Labs Reviewed  URINALYSIS, ROUTINE W REFLEX MICROSCOPIC - Abnormal; Notable for the following components:      Result Value   Color, Urine YELLOW (*)    APPearance HAZY (*)    Hgb urine dipstick LARGE (*)    Ketones, ur 5 (*)    Protein, ur 30 (*)    Leukocytes,Ua MODERATE (*)    All other components within normal limits  CBC WITH DIFFERENTIAL/PLATELET - Abnormal; Notable for the following components:   WBC 14.5 (*)    Platelets 96 (*)  Neutro Abs 11.2 (*)    Monocytes Absolute 1.8 (*)    Abs Immature Granulocytes 0.10 (*)    All other components within normal limits  BASIC METABOLIC PANEL - Abnormal; Notable for the following components:   Sodium 133 (*)    CO2 21 (*)    Glucose, Bld 133 (*)    Calcium 8.2 (*)    All other components within normal limits    EKG    RADIOLOGY   Official radiology report(s): No results found.  PROCEDURES and INTERVENTIONS:  Procedures  Medications - No data to display   IMPRESSION / MDM / [place] / ED COURSE  I reviewed the triage vital signs and the nursing notes.  71 year old male presents to the ED with urinary hesitancy suitable for outpatient management.  He looks clinically well and has no complaints.  Benign exam and abdominal exam.  For some reason, Foley catheter was placed in triage for a postvoid residual of 125 cc.  Once he is back to her room, we remove this and provide p.o. water.  He voids and has postvoid residual of only 80.  Medications for  indwelling Foley catheter.  I urged him to continue his Flomax and follow-up with urology.  Work-up is otherwise benign.  He is currently on Cipro and indications to change his antibiotics considering his lack of symptoms.  He looks well and I doubt sepsis, pyelonephritis or other acute infectious pathologies.  Clinical Course as of 12/01/21 1603  Fri Dec 01, 2021  0806 I discussed plan of care with the patient and we discussed multiple options.  We discussed keeping indwelling Foley catheter in place until he can follow-up with urology versus removal of the catheter now that he is in the room, and performing a subsequent postvoid residual.  He prefers the latter.  I provide him a cup of water and the nurse will remove his Foley catheter. [DS]  [phone] After removal of the catheter, he voided about 400 cc of urine and has a postvoid residual of about 80. [DS]    Clinical Course User Index [DS] Vladimir Crofts, MD     FINAL CLINICAL IMPRESSION(S) / ED DIAGNOSES   Final diagnoses:  Urinary hesitancy     Rx / DC Orders   ED Discharge Orders     None        Note:  This document was prepared using Dragon voice recognition software and may include unintentional dictation errors.   Vladimir Crofts, MD 12/01/21 [phone]

## 2021-12-01 NOTE — ED Notes (Addendum)
Catheter removed and pt given another glass of water. Pt informed that he needs to tell staff when he needs to urinate so they can measure how much he has left in his bladder after peeing.  Leg bag had 200cc of urine and pt states that he emptied it prior to coming back to a room

## 2021-12-01 NOTE — Discharge Instructions (Signed)
Continue taking your [place], follow-up with urology and return to the ED if you cannot void while having the sensation to do so.

## 2021-12-02 NOTE — Progress Notes (Addendum)
ED Antimicrobial Stewardship Positive Culture Follow Up   Christian Cole is an 71 y.o. male who presented to [facility] on 12/01/2021 with a chief complaint of urinary retention. Chief Complaint  Patient presents with   Urinary Retention    Recent Results (from the past 720 hour(s))  Resp Panel by RT-PCR (Flu A&B, Covid) Nasopharyngeal Swab     Status: None   Collection Time: 11/28/21  9:41 PM   Specimen: Nasopharyngeal Swab; Nasopharyngeal(NP) swabs in vial transport medium  Result Value Ref Range Status   SARS Coronavirus 2 by RT PCR NEGATIVE NEGATIVE Final    Comment: (NOTE) SARS-CoV-2 target nucleic acids are NOT DETECTED.  The SARS-CoV-2 RNA is generally detectable in upper respiratory specimens during the acute phase of infection. The lowest concentration of SARS-CoV-2 viral copies this assay can detect is 138 copies/mL. A negative result does not preclude SARS-Cov-2 infection and should not be used as the sole basis for treatment or other patient management decisions. A negative result may occur with  improper specimen collection/handling, submission of specimen other than nasopharyngeal swab, presence of viral mutation(s) within the areas targeted by this assay, and inadequate number of viral copies(<138 copies/mL). A negative result must be combined with clinical observations, patient history, and epidemiological information. The expected result is Negative.  Fact Sheet for Patients:  EntrepreneurPulse.com.au  Fact Sheet for Healthcare Providers:  IncredibleEmployment.be  This test is no t yet approved or cleared by the [place] FDA and  has been authorized for detection and/or diagnosis of SARS-CoV-2 by FDA under an Emergency Use Authorization (EUA). This EUA will remain  in effect (meaning this test can be used) for the duration of the COVID-19 declaration under Section 564(b)(1) of the Act, 21 U.S.C.section  360bbb-3(b)(1), unless the authorization is terminated  or revoked sooner.       Influenza A by PCR NEGATIVE NEGATIVE Final   Influenza B by PCR NEGATIVE NEGATIVE Final    Comment: (NOTE) The Xpert Xpress SARS-CoV-2/FLU/RSV plus assay is intended as an aid in the diagnosis of influenza from Nasopharyngeal swab specimens and should not be used as a sole basis for treatment. Nasal washings and aspirates are unacceptable for Xpert Xpress SARS-CoV-2/FLU/RSV testing.  Fact Sheet for Patients: EntrepreneurPulse.com.au  Fact Sheet for Healthcare Providers: IncredibleEmployment.be  This test is not yet approved or cleared by the [place] FDA and has been authorized for detection and/or diagnosis of SARS-CoV-2 by FDA under an Emergency Use Authorization (EUA). This EUA will remain in effect (meaning this test can be used) for the duration of the COVID-19 declaration under Section 564(b)(1) of the Act, 21 U.S.C. section 360bbb-3(b)(1), unless the authorization is terminated or revoked.  Performed at [facility], [address], [place], [place] [address]   Urine Culture     Status: Abnormal   Collection Time: 11/28/21  9:42 PM   Specimen: Urine, Catheterized  Result Value Ref Range Status   Specimen Description   Final    URINE, CATHETERIZED Performed at [facility], [address], [place], [place] [address]    Special Requests   Final    NONE Performed at [facility], [place], [place] [address]    Culture 90,000 COLONIES/mL ENTEROCOCCUS FAECALIS (A)  Final   Report Status 12/01/2021 FINAL  Final   Organism ID, Bacteria ENTEROCOCCUS FAECALIS (A)  Final      Susceptibility   Enterococcus faecalis - MIC*    AMPICILLIN <=2 SENSITIVE Sensitive  NITROFURANTOIN <=16 SENSITIVE Sensitive     VANCOMYCIN 1 SENSITIVE Sensitive     * 90,000 COLONIES/mL ENTEROCOCCUS FAECALIS   Discharged with  ciprofloxacin.   Spoke with ED physician. Decision made to change to levofloxacin if patient still has symptoms. Spoke with patient who reported feeling much better, and denied urinary symptoms.   ED Provider: Dr. Vladimir Crofts  Wynelle Cleveland, PharmD Pharmacy Resident  12/02/2021 2:28 PM

## 2021-12-06 ENCOUNTER — Encounter: Payer: Self-pay | Admitting: Urology

## 2021-12-07 ENCOUNTER — Other Ambulatory Visit: Payer: Self-pay | Admitting: *Deleted

## 2021-12-07 DIAGNOSIS — N138 Other obstructive and reflux uropathy: Secondary | ICD-10-CM

## 2021-12-07 MED ORDER — SILODOSIN 8 MG PO CAPS: 8.0000 mg | ORAL_CAPSULE | Freq: Every day | ORAL | 3 refills | Status: DC

## 2021-12-11 NOTE — Anesthesia Postprocedure Evaluation (Signed)
Anesthesia Post Note  Patient: Christian Cole  Procedure(s) Performed: HOLEP-LASER ENUCLEATION OF THE PROSTATE WITH MORCELLATION  Patient location during evaluation: PACU Anesthesia Type: General Level of consciousness: awake and oriented Pain management: satisfactory to patient Vital Signs Assessment: post-procedure vital signs reviewed and stable Respiratory status: respiratory function stable Anesthetic complications: no   No notable events documented.   Last Vitals:  Vitals:   11/27/21 1215 11/27/21 1256  BP: (!) 145/78 121/66  Pulse: (!) 52 (!) 55  Resp: 15 16  Temp: (!) 36 C 36.8 C  SpO2: 98% 98%    Last Pain:  Vitals:   11/27/21 1256  TempSrc: Temporal  PainSc:                  VAN STAVEREN,Christian Cole

## 2021-12-14 ENCOUNTER — Encounter: Payer: Self-pay | Admitting: Urology

## 2021-12-14 DIAGNOSIS — N138 Other obstructive and reflux uropathy: Secondary | ICD-10-CM

## 2021-12-18 MED ORDER — SILODOSIN 8 MG PO CAPS: 8.0000 mg | ORAL_CAPSULE | Freq: Every day | ORAL | 0 refills | Status: DC

## 2021-12-25 ENCOUNTER — Other Ambulatory Visit: Payer: Self-pay

## 2021-12-25 ENCOUNTER — Ambulatory Visit (INDEPENDENT_AMBULATORY_CARE_PROVIDER_SITE_OTHER): Payer: Medicare Other | Admitting: Surgery

## 2021-12-25 ENCOUNTER — Encounter: Payer: Self-pay | Admitting: Surgery

## 2021-12-25 ENCOUNTER — Telehealth: Payer: Self-pay

## 2021-12-25 VITALS — BP 148/75 | HR 62 | Temp 98.7°F | Ht 70.5 in | Wt 208.2 lb

## 2021-12-25 DIAGNOSIS — K4020 Bilateral inguinal hernia, without obstruction or gangrene, not specified as recurrent: Secondary | ICD-10-CM

## 2021-12-25 DIAGNOSIS — K429 Umbilical hernia without obstruction or gangrene: Secondary | ICD-10-CM

## 2021-12-25 DIAGNOSIS — I2510 Atherosclerotic heart disease of native coronary artery without angina pectoris: Secondary | ICD-10-CM

## 2021-12-25 NOTE — Patient Instructions (Addendum)
Our surgery scheduler will call you within 24-48 hours to schedule your surgery.Please have the [facility] surgery sheet available when speaking with her.   Stop the Plavix 01/03/22 -7 days prior to surgery.    Inguinal Hernia, Adult An inguinal hernia develops when fat or the intestines push through a weak spot in a muscle where the leg meets the lower abdomen (groin). This creates a bulge. This kind of hernia could also be: In the scrotum, if you are male. In folds of skin around the vagina, if you are male. There are three types of inguinal hernias: Hernias that can be pushed back into the abdomen (are reducible). This type rarely causes pain. Hernias that are not reducible (are incarcerated). Hernias that are not reducible and lose their blood supply (are strangulated). This type of hernia requires emergency surgery. What are the causes? This condition is caused by having a weak spot in the muscles or tissues in your groin. This develops over time. The hernia may poke through the weak spot when you suddenly strain your lower abdominal muscles, such as when you: Lift a heavy object. Strain to have a bowel movement. Constipation can lead to straining. Cough. What increases the risk? This condition is more likely to develop in: Males. Pregnant females. People who: Are overweight. Work in jobs that require long periods of standing or heavy lifting. Have had an inguinal hernia before. Smoke or have lung disease. These factors can lead to long-term (chronic) coughing. What are the signs or symptoms? Symptoms may depend on the size of the hernia. Often, a small inguinal hernia has no symptoms. Symptoms of a larger hernia may include: A bulge in the groin area. This is easier to see when standing. It might not be visible when lying down. Pain or burning in the groin. This may get worse when lifting, straining, or coughing. A dull ache or a feeling of pressure in the groin. An unusual bulge in  the scrotum, in males. Symptoms of a strangulated inguinal hernia may include: A bulge in your groin that is very painful and tender to the touch. A bulge that turns red or purple. Fever, nausea, and vomiting. Inability to have a bowel movement or to pass gas. How is this diagnosed? This condition is diagnosed based on your symptoms, your medical history, and a physical exam. Your health care provider may feel your groin area and ask you to cough. How is this treated? Treatment depends on the size of your hernia and whether you have symptoms. If you do not have symptoms, your health care provider may have you watch your hernia carefully and have you come in for follow-up visits. If your hernia is large or if you have symptoms, you may need surgery to repair the hernia. Follow these instructions at home: Lifestyle Avoid lifting heavy objects. Avoid standing for long periods of time. Do not use any products that contain nicotine or tobacco. These products include cigarettes, chewing tobacco, and vaping devices, such as e-cigarettes. If you need help quitting, ask your health care provider. Maintain a healthy weight. Preventing constipation You may need to take these actions to prevent or treat constipation: Drink enough fluid to keep your urine pale yellow. Take over-the-counter or prescription medicines. Eat foods that are high in fiber, such as beans, whole grains, and fresh fruits and vegetables. Limit foods that are high in fat and processed sugars, such as fried or sweet foods. General instructions You may try to push the hernia back  in place by very gently pressing on it while lying down. Do not try to force the bulge back in if it will not push in easily. Watch your hernia for any changes in shape, size, or color. Get help right away if you notice any changes. Take over-the-counter and prescription medicines only as told by your health care provider. Keep all follow-up visits. This is  important. Contact a health care provider if: You have a fever or chills. You develop new symptoms. Your symptoms get worse. Get help right away if: You have pain in your groin that suddenly gets worse. You have a bulge in your groin that: Suddenly gets bigger and does not get smaller. Becomes red or purple or painful to the touch. You are a man and you have a sudden pain in your scrotum, or the size of your scrotum suddenly changes. You cannot push the hernia back in place by very gently pressing on it when you are lying down. You have nausea or vomiting that does not go away. You have a fast heartbeat. You cannot have a bowel movement or pass gas. These symptoms may represent a serious problem that is an emergency. Do not wait to see if the symptoms will go away. Get medical help right away. Call your local emergency services (911 in the [place] Summary An inguinal hernia develops when fat or the intestines push through a weak spot in a muscle where your leg meets your lower abdomen (groin). This condition is caused by having a weak spot in muscles or tissues in your groin. Symptoms may depend on the size of the hernia, and they may include pain or swelling in your groin. A small inguinal hernia often has no symptoms. Treatment may not be needed if you do not have symptoms. If you have symptoms or a large hernia, you may need surgery to repair the hernia. Avoid lifting heavy objects. Also, avoid standing for long periods of time. This information is not intended to replace advice given to you by your health care provider. Make sure you discuss any questions you have with your health care provider. Document Revised: 06/28/2020 Document Reviewed: 06/28/2020 Elsevier Patient Education  2022 [employer].

## 2021-12-25 NOTE — H&P (View-Only) (Signed)
12/25/2021  Reason for Visit:  Bilateral inguinal hernia and umbilical hernia  History of Present Illness: Christian Cole is a 71 y.o. male presenting for evaluation of bilateral inguinal hernia and umbilical hernia.  The patient was seen in the past by Dr. Burt Knack but he was asymptomatic at the time and the hernias were small so he did not schedule surgery.  Now he reports that he feels the right side has enlarged and now also the left side is getting larger.  He wears a hernia truss underwear on the left side to help push the hernia back in.  Denies any significant discomfort on either side.  Denies any significant abdominal pain, nausea, vomiting, chest pain, shortness of breath.  Since they feel larger, he wanted to be re-evaluated for possible surgical repair.    He has a history of CABG in 2000 and is currently on Plavix.  He also has a history of penile prosthetic implant in 2020.  He most recently has a history of HOLEP procedure for enlarged prostate with Dr. Erlene Quan a month ago and was cleared by cardiology for it.  Currently denies any urinary issues except for some mild "dribbling" and he wears a pad to help with it.  Denies any gross incontinence.  Past Medical History: Past Medical History:  Diagnosis Date   CAD (coronary artery disease)    a. s/p CABG in 2000 with LIMA-LAD, RIMA-PDA, SVG-D1, and SVG-PLA b. low-risk NST in 2016   Cancer [facility])    skin cancer follows Dr. Kellie Moor    ED (erectile dysfunction)    GERD (gastroesophageal reflux disease)    Hyperlipidemia    Hypertension    Followed by Dr. Claiborne Billings   OSA on CPAP    Sinus bradycardia    Thrombocytopenia (HCC)    Tongue lesion    benign from trauma as of 06/20/21     Past Surgical History: Past Surgical History:  Procedure Laterality Date   CATARACT EXTRACTION, BILATERAL  2010   COLONOSCOPY WITH PROPOFOL N/A 04/15/2020   Procedure: COLONOSCOPY WITH PROPOFOL;  Surgeon: Robert Bellow, MD;  Location: [facility]  ENDOSCOPY;  Service: Endoscopy;  Laterality: N/A;   CORONARY ARTERY BYPASS GRAFT  2000   LIMA to the LAD,RIMA to the PDA,vein graft to the diagonal & vein graft to the PLA   ESOPHAGOGASTRODUODENOSCOPY     Dr. Gustavo Lah   ESOPHAGOGASTRODUODENOSCOPY  04/15/2020   Procedure: ESOPHAGOGASTRODUODENOSCOPY (EGD);  Surgeon: Robert Bellow, MD;  Location: [facility] ENDOSCOPY;  Service: Endoscopy;;   HOLEP-LASER ENUCLEATION OF THE PROSTATE WITH MORCELLATION N/A 11/27/2021   Procedure: HOLEP-LASER ENUCLEATION OF THE PROSTATE WITH MORCELLATION;  Surgeon: Hollice Espy, MD;  Location: [facility] ORS;  Service: Urology;  Laterality: N/A;   KNEE ARTHROSCOPY Right 1991   KNEE DEBRIDEMENT Right 1989   Fluid flush   PENILE PROSTHESIS IMPLANT     06/26/19 Dr. Francesca Jewett   SHOULDER ARTHROSCOPY Left 2011   US ECHOCARDIOGRAPHY  12/12/2010   mild LA dilatation, normal LV systolic fx, EF > 24%    Home Medications: Prior to Admission medications   Medication Sig Start Date End Date Taking? Authorizing Provider  amLODipine (NORVASC) 10 MG tablet Take 1 tablet (10 mg total) by mouth daily. 09/04/21  Yes Troy Sine, MD  atorvastatin (LIPITOR) 40 MG tablet Take 1 tablet (40 mg total) by mouth daily. At night 06/20/21  Yes [name], Nino Glow, MD  benazepril (LOTENSIN) 40 MG tablet Take 1 tablet (40 mg total) by mouth daily.  06/23/21 06/23/22 Yes [name], Nino Glow, MD  clopidogrel (PLAVIX) 75 MG tablet Take 1 tablet (75 mg total) by mouth daily. 06/23/21  Yes [name], Nino Glow, MD  ezetimibe (ZETIA) 10 MG tablet Take 1 tablet (10 mg total) by mouth daily. 06/20/21  Yes [name], Nino Glow, MD  Multiple Vitamins-Minerals (MULTIVITAMIN WITH MINERALS) tablet Take 1 tablet by mouth daily. Men 50+   Yes [provider]  NEEDLE, DISP, 18 G (BD SAFETYGLIDE NEEDLE) 18G X 1-1/2" MISC Draw up medication with this needle 01/06/21  Yes Stoioff, Scott C, MD  pantoprazole (PROTONIX) 40 MG tablet TAKE 1 TABLET BY  MOUTH EVERY DAY 30 MINUTES BEFORE A MEAL 02/07/21  Yes [name], Nino Glow, MD  silodosin (RAPAFLO) 8 MG CAPS capsule Take 1 capsule (8 mg total) by mouth daily with breakfast. 12/18/21  Yes Hollice Espy, MD  SYRINGE-NEEDLE, DISP, 3 ML 23G X 1-1/2" 3 ML MISC Use as directed QOWEEK 06/20/21  Yes [name], Nino Glow, MD  testosterone cypionate (DEPOTESTOSTERONE CYPIONATE) 200 MG/ML injection ADMINISTER 1.5 ML(300 MG) IN THE MUSCLE EVERY 14 DAYS 11/03/21  Yes Stoioff, Ronda Fairly, MD    Allergies: Allergies  Allergen Reactions   Tamsulosin Other (See Comments)    Chest discomfort, basal dilation with a warm feeling in chest     Social History:  reports that he has been smoking cigarettes. He has a 22.00 pack-year smoking history. He has never used smokeless tobacco. He reports current alcohol use of about 10.0 standard drinks per week. He reports that he does not use drugs.   Family History: Family History  Problem Relation Age of Onset   Arthritis Mother    Heart disease Father        CAD - MI   Stroke Father    Hypertension Father    Cancer Father 29       esophageal cancer    Arthritis Maternal Grandmother     Review of Systems: Review of Systems  Constitutional:  Negative for chills and fever.  HENT:  Negative for hearing loss.   Respiratory:  Negative for shortness of breath.   Cardiovascular:  Negative for chest pain.  Gastrointestinal:  Positive for abdominal pain. Negative for nausea and vomiting.  Genitourinary:  Negative for dysuria.  Musculoskeletal:  Negative for myalgias.  Skin:  Negative for rash.  Neurological:  Negative for dizziness.  Psychiatric/Behavioral:  Negative for depression.    Physical Exam BP (!) 148/75    Pulse 62    Temp 98.7 F (37.1 C) (Oral)    Ht 5' 10.5" (1.791 m)    Wt 208 lb 3.2 oz (94.4 kg)    SpO2 96%    BMI 29.45 kg/m  CONSTITUTIONAL: No acute distress, well nourished. HEENT:  Normocephalic, atraumatic, extraocular motion  intact. NECK: Trachea is midline, and there is no jugular venous distension.  RESPIRATORY:  Lungs are clear, and breath sounds are equal bilaterally. Normal respiratory effort without pathologic use of accessory muscles. CARDIOVASCULAR: Heart is regular without murmurs, gallops, or rubs. GI: The abdomen is soft, non-distended, non-tender to palpation.  Patient has a right larger than left reducible inguinal hernias, both of which are reducible without any concern for strangulation.  He also has a small <1 cm umbilical hernia which is reducible. GU: Patient has a prosthetic penile implant.  Pump in the scrotum.  Incision well healed. MUSCULOSKELETAL:  Normal muscle strength and tone in all four extremities.  No peripheral edema or cyanosis. SKIN: Skin turgor is  normal. There are no pathologic skin lesions.  NEUROLOGIC:  Motor and sensation is grossly normal.  Cranial nerves are grossly intact. PSYCH:  Alert and oriented to person, place and time. Affect is normal.  Laboratory Analysis: Labs from 12/01/21: Na 133, K 3.7, Cl 106, CO2 21, BUN 20, Cr 1.2.  WBC 14.5, Hgb 14.8, Hct 44.1, Plt 96  Imaging: No results found.  Assessment and Plan: This is a 71 y.o. male with bilateral inguinal hernias and umbilical hernia.  --Discussed with the patient that both of his inguinal hernias are easily reducible and there is no concern for strangulation.  His umbilical hernia is also reducible and small.  We can approach his repairs via robotic approach. --The patient discussed his penile prosthetic implant and if that poses any concerns for surgery.  Will review the patient's operative note to check location and if any issues intraop.  For now, tentatively, discussed with him the potential to schedule him for robotic assisted bilateral inguinal hernia repair and open umbilical hernia repair.  Reviewed the surgery at length with him including risks of bleeding, infection, injury to surrounding structures, that  this is an outpatient surgery, post-operative activity restrictions, pain control, and he's willing to proceed. --Will schedule him for 01/11/22.  Will send cardiology and medical clearance for him.  Last dose of Plavix should be on 01/03/22.  I spent 60 minutes dedicated to the care of this patient on the date of this encounter to include pre-visit review of records, face-to-face time with the patient discussing diagnosis and management, and any post-visit coordination of care.   Melvyn Neth, [facility]

## 2021-12-25 NOTE — Telephone Encounter (Signed)
Cardiac Clearance faxed to [facility].  Medical Clearance faxed to [facility].

## 2021-12-25 NOTE — Progress Notes (Signed)
12/25/2021  Reason for Visit:  Bilateral inguinal hernia and umbilical hernia  History of Present Illness: Christian Cole is a 71 y.o. male presenting for evaluation of bilateral inguinal hernia and umbilical hernia.  The patient was seen in the past by Dr. Burt Knack but he was asymptomatic at the time and the hernias were small so he did not schedule surgery.  Now he reports that he feels the right side has enlarged and now also the left side is getting larger.  He wears a hernia truss underwear on the left side to help push the hernia back in.  Denies any significant discomfort on either side.  Denies any significant abdominal pain, nausea, vomiting, chest pain, shortness of breath.  Since they feel larger, he wanted to be re-evaluated for possible surgical repair.    He has a history of CABG in 2000 and is currently on Plavix.  He also has a history of penile prosthetic implant in 2020.  He most recently has a history of HOLEP procedure for enlarged prostate with Dr. Erlene Quan a month ago and was cleared by cardiology for it.  Currently denies any urinary issues except for some mild "dribbling" and he wears a pad to help with it.  Denies any gross incontinence.  Past Medical History: Past Medical History:  Diagnosis Date   CAD (coronary artery disease)    a. s/p CABG in 2000 with LIMA-LAD, RIMA-PDA, SVG-D1, and SVG-PLA b. low-risk NST in 2016   Cancer [facility])    skin cancer follows Dr. Kellie Moor    ED (erectile dysfunction)    GERD (gastroesophageal reflux disease)    Hyperlipidemia    Hypertension    Followed by Dr. Claiborne Billings   OSA on CPAP    Sinus bradycardia    Thrombocytopenia (HCC)    Tongue lesion    benign from trauma as of 06/20/21     Past Surgical History: Past Surgical History:  Procedure Laterality Date   CATARACT EXTRACTION, BILATERAL  2010   COLONOSCOPY WITH PROPOFOL N/A 04/15/2020   Procedure: COLONOSCOPY WITH PROPOFOL;  Surgeon: Robert Bellow, MD;  Location: [facility]  ENDOSCOPY;  Service: Endoscopy;  Laterality: N/A;   CORONARY ARTERY BYPASS GRAFT  2000   LIMA to the LAD,RIMA to the PDA,vein graft to the diagonal & vein graft to the PLA   ESOPHAGOGASTRODUODENOSCOPY     Dr. Gustavo Lah   ESOPHAGOGASTRODUODENOSCOPY  04/15/2020   Procedure: ESOPHAGOGASTRODUODENOSCOPY (EGD);  Surgeon: Robert Bellow, MD;  Location: [facility] ENDOSCOPY;  Service: Endoscopy;;   HOLEP-LASER ENUCLEATION OF THE PROSTATE WITH MORCELLATION N/A 11/27/2021   Procedure: HOLEP-LASER ENUCLEATION OF THE PROSTATE WITH MORCELLATION;  Surgeon: Hollice Espy, MD;  Location: [facility] ORS;  Service: Urology;  Laterality: N/A;   KNEE ARTHROSCOPY Right 1991   KNEE DEBRIDEMENT Right 1989   Fluid flush   PENILE PROSTHESIS IMPLANT     06/26/19 Dr. Francesca Jewett   SHOULDER ARTHROSCOPY Left 2011   US ECHOCARDIOGRAPHY  12/12/2010   mild LA dilatation, normal LV systolic fx, EF > 19%    Home Medications: Prior to Admission medications   Medication Sig Start Date End Date Taking? Authorizing Provider  amLODipine (NORVASC) 10 MG tablet Take 1 tablet (10 mg total) by mouth daily. 09/04/21  Yes Troy Sine, MD  atorvastatin (LIPITOR) 40 MG tablet Take 1 tablet (40 mg total) by mouth daily. At night 06/20/21  Yes [name], Nino Glow, MD  benazepril (LOTENSIN) 40 MG tablet Take 1 tablet (40 mg total) by mouth daily.  06/23/21 06/23/22 Yes [name], Nino Glow, MD  clopidogrel (PLAVIX) 75 MG tablet Take 1 tablet (75 mg total) by mouth daily. 06/23/21  Yes [name], Nino Glow, MD  ezetimibe (ZETIA) 10 MG tablet Take 1 tablet (10 mg total) by mouth daily. 06/20/21  Yes [name], Nino Glow, MD  Multiple Vitamins-Minerals (MULTIVITAMIN WITH MINERALS) tablet Take 1 tablet by mouth daily. Men 50+   Yes [provider]  NEEDLE, DISP, 18 G (BD SAFETYGLIDE NEEDLE) 18G X 1-1/2" MISC Draw up medication with this needle 01/06/21  Yes Stoioff, Scott C, MD  pantoprazole (PROTONIX) 40 MG tablet TAKE 1 TABLET BY  MOUTH EVERY DAY 30 MINUTES BEFORE A MEAL 02/07/21  Yes [name], Nino Glow, MD  silodosin (RAPAFLO) 8 MG CAPS capsule Take 1 capsule (8 mg total) by mouth daily with breakfast. 12/18/21  Yes Hollice Espy, MD  SYRINGE-NEEDLE, DISP, 3 ML 23G X 1-1/2" 3 ML MISC Use as directed QOWEEK 06/20/21  Yes [name], Nino Glow, MD  testosterone cypionate (DEPOTESTOSTERONE CYPIONATE) 200 MG/ML injection ADMINISTER 1.5 ML(300 MG) IN THE MUSCLE EVERY 14 DAYS 11/03/21  Yes Stoioff, Ronda Fairly, MD    Allergies: Allergies  Allergen Reactions   Tamsulosin Other (See Comments)    Chest discomfort, basal dilation with a warm feeling in chest     Social History:  reports that he has been smoking cigarettes. He has a 22.00 pack-year smoking history. He has never used smokeless tobacco. He reports current alcohol use of about 10.0 standard drinks per week. He reports that he does not use drugs.   Family History: Family History  Problem Relation Age of Onset   Arthritis Mother    Heart disease Father        CAD - MI   Stroke Father    Hypertension Father    Cancer Father 13       esophageal cancer    Arthritis Maternal Grandmother     Review of Systems: Review of Systems  Constitutional:  Negative for chills and fever.  HENT:  Negative for hearing loss.   Respiratory:  Negative for shortness of breath.   Cardiovascular:  Negative for chest pain.  Gastrointestinal:  Positive for abdominal pain. Negative for nausea and vomiting.  Genitourinary:  Negative for dysuria.  Musculoskeletal:  Negative for myalgias.  Skin:  Negative for rash.  Neurological:  Negative for dizziness.  Psychiatric/Behavioral:  Negative for depression.    Physical Exam BP (!) 148/75    Pulse 62    Temp 98.7 F (37.1 C) (Oral)    Ht 5' 10.5" (1.791 m)    Wt 208 lb 3.2 oz (94.4 kg)    SpO2 96%    BMI 29.45 kg/m  CONSTITUTIONAL: No acute distress, well nourished. HEENT:  Normocephalic, atraumatic, extraocular motion  intact. NECK: Trachea is midline, and there is no jugular venous distension.  RESPIRATORY:  Lungs are clear, and breath sounds are equal bilaterally. Normal respiratory effort without pathologic use of accessory muscles. CARDIOVASCULAR: Heart is regular without murmurs, gallops, or rubs. GI: The abdomen is soft, non-distended, non-tender to palpation.  Patient has a right larger than left reducible inguinal hernias, both of which are reducible without any concern for strangulation.  He also has a small <1 cm umbilical hernia which is reducible. GU: Patient has a prosthetic penile implant.  Pump in the scrotum.  Incision well healed. MUSCULOSKELETAL:  Normal muscle strength and tone in all four extremities.  No peripheral edema or cyanosis. SKIN: Skin turgor is  normal. There are no pathologic skin lesions.  NEUROLOGIC:  Motor and sensation is grossly normal.  Cranial nerves are grossly intact. PSYCH:  Alert and oriented to person, place and time. Affect is normal.  Laboratory Analysis: Labs from 12/01/21: Na 133, K 3.7, Cl 106, CO2 21, BUN 20, Cr 1.2.  WBC 14.5, Hgb 14.8, Hct 44.1, Plt 96  Imaging: No results found.  Assessment and Plan: This is a 71 y.o. male with bilateral inguinal hernias and umbilical hernia.  --Discussed with the patient that both of his inguinal hernias are easily reducible and there is no concern for strangulation.  His umbilical hernia is also reducible and small.  We can approach his repairs via robotic approach. --The patient discussed his penile prosthetic implant and if that poses any concerns for surgery.  Will review the patient's operative note to check location and if any issues intraop.  For now, tentatively, discussed with him the potential to schedule him for robotic assisted bilateral inguinal hernia repair and open umbilical hernia repair.  Reviewed the surgery at length with him including risks of bleeding, infection, injury to surrounding structures, that  this is an outpatient surgery, post-operative activity restrictions, pain control, and he's willing to proceed. --Will schedule him for 01/11/22.  Will send cardiology and medical clearance for him.  Last dose of Plavix should be on 01/03/22.  I spent 60 minutes dedicated to the care of this patient on the date of this encounter to include pre-visit review of records, face-to-face time with the patient discussing diagnosis and management, and any post-visit coordination of care.   Melvyn Neth, [facility]

## 2021-12-26 ENCOUNTER — Telehealth: Payer: Self-pay | Admitting: Surgery

## 2021-12-26 ENCOUNTER — Telehealth: Payer: Self-pay | Admitting: *Deleted

## 2021-12-26 NOTE — Telephone Encounter (Signed)
Patient has been advised of Pre-Admission date/time, COVID Testing date and Surgery date.  Surgery Date: 01/11/22 Preadmission Testing Date: 01/04/22 (phone 8a-1p) Covid Testing Date: Not needed.    Patient has been made aware to call [phone], between 1-3:00pm the day before surgery, to find out what time to arrive for surgery.

## 2021-12-26 NOTE — Telephone Encounter (Signed)
° ° °  Patient Name: Christian Cole  DOB: Sep 08, 1951 MRN: [id number]  Primary Cardiologist: Shelva Majestic, MD  Chart reviewed as part of pre-operative protocol coverage. Patients last OV was 11/20/2021 and he reported to me today that  he was doing well without any complaints of chest pain,syncope, or SOB and could also complete 4 METS of activity without difficulty. Given past medical history and time since last visit, based on ACC/AHA guidelines, Christian Cole would be at acceptable risk for the planned procedure without further cardiovascular testing.   Recent Myoview performed on 10/13/2021 shows no significant reversible ischemia.  He is at acceptable risk  to proceed with hernia repair.  He may hold Plavix for 5 to 7 days prior to the procedure and restart as soon as possible afterward at the surgeon's discretion based on the bleeding risk.    I will route this recommendation to the requesting party via Epic fax function and remove from pre-op pool.  Please call with questions.  Mable Fill, Marissa Nestle, NP 12/26/2021, 2:50 PM

## 2021-12-26 NOTE — Telephone Encounter (Signed)
° °  Pre-operative Risk Assessment    Patient Name: Christian Cole  DOB: 1950/11/27 MRN: [id number]      Request for Surgical Clearance    Procedure:   Bilateral Inguinal hernia repair   Date of Surgery:  Clearance TBD                                 Surgeon:  Dr Olean Ree  Surgeon's Group or Practice Name:  [facility] Phone number:  [phone] Fax number:  [phone]   Type of Clearance Requested:   - Medical  - Pharmacy:  Hold Clopidogrel (Plavix)     Type of Anesthesia:  Not Indicated   Additional requests/questions:  Please advise surgeon/provider what medications should be held.  Christian Cole   12/26/2021, 12:10 PM

## 2021-12-27 ENCOUNTER — Ambulatory Visit: Payer: TRICARE For Life (TFL) | Admitting: Internal Medicine

## 2021-12-27 NOTE — Progress Notes (Signed)
Cardiac Clearance has been received from Dr Evette Georges office at [facility]. The patient is cleared at acceptable risk for surgery. He may hold his Plavix for 5-7 days prior to the procedure. All notes are in Epic.

## 2022-01-01 ENCOUNTER — Telehealth: Payer: Self-pay | Admitting: Surgery

## 2022-01-01 ENCOUNTER — Other Ambulatory Visit: Payer: Self-pay

## 2022-01-01 ENCOUNTER — Encounter: Payer: Self-pay | Admitting: Surgery

## 2022-01-01 ENCOUNTER — Telehealth: Payer: Self-pay

## 2022-01-01 DIAGNOSIS — K4020 Bilateral inguinal hernia, without obstruction or gangrene, not specified as recurrent: Secondary | ICD-10-CM

## 2022-01-01 NOTE — Telephone Encounter (Signed)
Patient called back and confirmed receipt of the message. He did not have any questions.

## 2022-01-01 NOTE — Progress Notes (Signed)
01/02/22 9:46 AM   Christian Cole 29-Sep-1951 [id number]  Referring provider:  [name], Nino Glow, MD [place],  [place] [id number] Chief Complaint  Patient presents with   Benign Prostatic Hypertrophy     HPI: Christian Cole is a 71 y.o.male male with a personal history of BPH with LUTS, who presents today for 1 month follow-up with IPSS and PVR.   He underwent a cystoscopy with Dr. Bernardo Heater on 10/09/2021. Retroflexion showed intravesical median lobe. His TRUS on 11/18/20221 revealed a prostate of 5.92 x 4.93 x 6.45 for a total volume of 98.6.    He has significant urinary symptoms including poor urinary stream, elevated PVRs in the 300 range.  He is not able to tolerate Flomax due to vasodilation.   He does have a significant cardiac history.  He is s/p HoLEP with morcellation on 11/27/2021. Intraoperative findings: Trilobar coaptation with elevated bladder neck.  Penile prosthesis deflated during procedure.  Small bladder mucosal injury left anterior, no concern for perforation. Prostate chips were sent for pathology and was consistent with benign prostatic tissue with nodular hypertrophy. 26.8 gm were resected.   His catheter was removed on 11/30/2021.   He is scheduled to undergo bilateral robotic hernia repair with Dr. Hampton Abbot I discussed his case with him.  He does have a personal history of penile prosthesis which based on our discussion today sounds like it was placed via penoscrotal approach.  Reservoir is likely the space of [place].  Scheduled for CT tomorrow.  He reports that he still experiences leakage. He reports that he is currently doing pelvic floor exercises.   This leakage is not severe and improving.  Overall, he has had a dramatic improvement in his stream.  He is pleased with his results.  IPSS as below.  He is emptying adequately today.  He also has a personal history of hypogonadism managed on testosterone injections by Dr. Bernardo Heater.   He will be due for labs in the near future.   IPSS     Row Name 01/02/22 0900         International Prostate Symptom Score   How often have you had the sensation of not emptying your bladder? Not at All     How often have you had to urinate less than every two hours? Not at All     How often have you found you stopped and started again several times when you urinated? Not at All     How often have you found it difficult to postpone urination? Less than 1 in 5 times     How often have you had a weak urinary stream? Not at All     How often have you had to strain to start urination? Not at All     How many times did you typically get up at night to urinate? 1 Time     Total IPSS Score 2       Quality of Life due to urinary symptoms   If you were to spend the rest of your life with your urinary condition just the way it is now how would you feel about that? Delighted              Score:  1-7 Mild 8-19 Moderate 20-35 Severe   PMH: Past Medical History:  Diagnosis Date   CAD (coronary artery disease)    a. s/p CABG in 2000 with LIMA-LAD, RIMA-PDA, SVG-D1, and SVG-PLA b. low-risk NST in  2016   Cancer ([facility])    skin cancer follows Dr. Kellie Moor    ED (erectile dysfunction)    GERD (gastroesophageal reflux disease)    Hyperlipidemia    Hypertension    Followed by Dr. Claiborne Billings   OSA on CPAP    Sinus bradycardia    Thrombocytopenia (H[facility])    Tongue lesion    benign from trauma as of 06/20/21    Surgical History: Past Surgical History:  Procedure Laterality Date   CATARACT EXTRACTION, BILATERAL  2010   COLONOSCOPY WITH PROPOFOL N/A 04/15/2020   Procedure: COLONOSCOPY WITH PROPOFOL;  Surgeon: Robert Bellow, MD;  Location: [facility] ENDOSCOPY;  Service: Endoscopy;  Laterality: N/A;   CORONARY ARTERY BYPASS GRAFT  2000   LIMA to the LAD,RIMA to the PDA,vein graft to the diagonal & vein graft to the PLA   ESOPHAGOGASTRODUODENOSCOPY     Dr. Gustavo Lah   ESOPHAGOGASTRODUODENOSCOPY   04/15/2020   Procedure: ESOPHAGOGASTRODUODENOSCOPY (EGD);  Surgeon: Robert Bellow, MD;  Location: [facility] ENDOSCOPY;  Service: Endoscopy;;   HOLEP-LASER ENUCLEATION OF THE PROSTATE WITH MORCELLATION N/A 11/27/2021   Procedure: HOLEP-LASER ENUCLEATION OF THE PROSTATE WITH MORCELLATION;  Surgeon: Hollice Espy, MD;  Location: [facility] ORS;  Service: Urology;  Laterality: N/A;   KNEE ARTHROSCOPY Right 1991   KNEE DEBRIDEMENT Right 1989   Fluid flush   PENILE PROSTHESIS IMPLANT     06/26/19 Dr. Francesca Jewett   SHOULDER ARTHROSCOPY Left 2011   US ECHOCARDIOGRAPHY  12/12/2010   mild LA dilatation, normal LV systolic fx, EF > 40%    Home Medications:  Allergies as of 01/02/2022       Reactions   Tamsulosin Other (See Comments)   Chest discomfort, basal dilation with a warm feeling in chest         Medication List        Accurate as of January 02, 2022  9:46 AM. If you have any questions, ask your nurse or doctor.          amLODipine 10 MG tablet Commonly known as: NORVASC Take 1 tablet (10 mg total) by mouth daily. What changed: when to take this   atorvastatin 40 MG tablet Commonly known as: LIPITOR Take 1 tablet (40 mg total) by mouth daily. At night What changed: when to take this   BD SafetyGlide Needle 18G X 1-1/2" Misc Generic drug: NEEDLE (DISP) 18 G Draw up medication with this needle   benazepril 40 MG tablet Commonly known as: LOTENSIN Take 1 tablet (40 mg total) by mouth daily. What changed: when to take this   clopidogrel 75 MG tablet Commonly known as: PLAVIX Take 1 tablet (75 mg total) by mouth daily. What changed: when to take this   ezetimibe 10 MG tablet Commonly known as: ZETIA Take 1 tablet (10 mg total) by mouth daily. What changed: when to take this   ibuprofen 200 MG tablet Commonly known as: ADVIL Take 400 mg by mouth every 8 (eight) hours as needed (pain.).   multivitamin with minerals tablet Take 1 tablet by mouth every evening. Men 50+    pantoprazole 40 MG tablet Commonly known as: PROTONIX TAKE 1 TABLET BY MOUTH EVERY DAY 30 MINUTES BEFORE A MEAL What changed:  how much to take when to take this   silodosin 8 MG Caps capsule Commonly known as: RAPAFLO Take 1 capsule (8 mg total) by mouth daily with breakfast.   SYRINGE-NEEDLE (DISP) 3 ML 23G X 1-1/2" 3 ML Misc Use as directed  QOWEEK   testosterone cypionate 200 MG/ML injection Commonly known as: DEPOTESTOSTERONE CYPIONATE ADMINISTER 1.5 ML(300 MG) IN THE MUSCLE EVERY 14 DAYS        Allergies:  Allergies  Allergen Reactions   Tamsulosin Other (See Comments)    Chest discomfort, basal dilation with a warm feeling in chest     Family History: Family History  Problem Relation Age of Onset   Arthritis Mother    Heart disease Father        CAD - MI   Stroke Father    Hypertension Father    Cancer Father 11       esophageal cancer    Arthritis Maternal Grandmother     Social History:  reports that he has been smoking cigarettes. He has a 22.00 pack-year smoking history. He has never used smokeless tobacco. He reports current alcohol use of about 10.0 standard drinks per week. He reports that he does not use drugs.   Physical Exam: BP (!) 144/77    Pulse 61    Ht 5\' 11"  (1.803 m)    Wt 208 lb (94.3 kg)    BMI 29.01 kg/m   Constitutional:  Alert and oriented, No acute distress. HEENT: [place] AT, moist mucus membranes.  Trachea midline, no masses. Cardiovascular: No clubbing, cyanosis, or edema. Respiratory: Normal respiratory effort, no increased work of breathing. Skin: No rashes, bruises or suspicious lesions. Neurologic: Grossly intact, no focal deficits, moving all 4 extremities. Psychiatric: Normal mood and affect.  Laboratory Data: Lab Results  Component Value Date   CREATININE 1.20 12/01/2021   Lab Results  Component Value Date   PSA 1.57 12/03/2018   PSA 0.96 12/03/2017   PSA 0.69 01/06/2014   Lab Results  Component Value Date    TESTOSTERONE 753 08/04/2021   Lab Results  Component Value Date   HGBA1C 5.9 06/20/2021    Pertinent Imaging: Results for orders placed or performed in visit on 01/02/22  BLADDER SCAN AMB NON-IMAGING  Result Value Ref Range   Scan Result 62 ml    Assessment & Plan:    BPH with LUTS  - He is s/p HoLEP, doing extremely well in terms of symptomology -Okay to stop Rapaflo - Bladder post void residual low  2.  Stress incontinence -Improving, continue pelvic floor exercises   3. Bilateral hernia /history of penile prosthesis - Scheduled to undergo bilateral robotic hernia repair with Dr. Hampton Abbot  -Case was discussed with Dr. Hampton Abbot, reviewed the anatomy as well as risk factors for injury to the prosthesis primarily reservoir tubing as well as the risk for infection.  Urology will be available to support intraoperatively there is any concerns.  We will need the device representative available (reviewed surgical dictation from Dr. Francesca Jewett in 2020, AMS device implanted) -will cc: Dr. Hampton Abbot  4.  Hypogonadism -We will be due for labs with Dr. Bernardo Heater, we will go ahead and order these in the form of PSA/H&H, testosterone for about 3 months and follow-up with Dr. Bernardo Heater -Continue IM testosterone cypionate  F/u 3 months with labs   [facility] [address], [place], [place] [address] [phone]

## 2022-01-01 NOTE — Telephone Encounter (Signed)
Per Dr Hampton Abbot patient needs a CT Pelvis prior to surgery scheduled on 01/11/22. Called patient and left him a message about this.  The patient's CT scan is scheduled for 01/03/22 at 4:00 pm at the [facility] center. He is to arrive there by 3:45 pm and have nothing to eat for 4 hours prior. He may call back with any questions.

## 2022-01-01 NOTE — Telephone Encounter (Signed)
01/01/22  Was able to review patient's operative note from [facility] about his prosthetic implant. There is concern from the note that this reservoir could be in a location that would interfere with his robotic left inguinal hernia repair.  If there is any injury to the reservoir or components, do not have the necessary equipment at [facility] for replacement/repair.  Discussed with him that obtaining a CT scan of pelvis would help [place] determine location of all the components and help [place] decide the better approach for his left inguinal hernia.  If any further issues, would refer to [facility] surgery for his repair as his urologist and the necessary equipment are located there.  Patient understands this plan and is in agreement.  Will order CT pelvis with IV contrast for further evaluation.  Olean Ree, MD

## 2022-01-02 ENCOUNTER — Other Ambulatory Visit: Payer: Self-pay

## 2022-01-02 ENCOUNTER — Ambulatory Visit (INDEPENDENT_AMBULATORY_CARE_PROVIDER_SITE_OTHER): Payer: Medicare Other | Admitting: Urology

## 2022-01-02 VITALS — BP 144/77 | HR 61 | Ht 71.0 in | Wt 208.0 lb

## 2022-01-02 DIAGNOSIS — E291 Testicular hypofunction: Secondary | ICD-10-CM

## 2022-01-02 DIAGNOSIS — R32 Unspecified urinary incontinence: Secondary | ICD-10-CM | POA: Diagnosis not present

## 2022-01-02 DIAGNOSIS — N401 Enlarged prostate with lower urinary tract symptoms: Secondary | ICD-10-CM

## 2022-01-02 DIAGNOSIS — I2510 Atherosclerotic heart disease of native coronary artery without angina pectoris: Secondary | ICD-10-CM

## 2022-01-02 DIAGNOSIS — K469 Unspecified abdominal hernia without obstruction or gangrene: Secondary | ICD-10-CM

## 2022-01-02 DIAGNOSIS — N138 Other obstructive and reflux uropathy: Secondary | ICD-10-CM

## 2022-01-02 LAB — BLADDER SCAN AMB NON-IMAGING: Scan Result: 62

## 2022-01-03 ENCOUNTER — Ambulatory Visit (INDEPENDENT_AMBULATORY_CARE_PROVIDER_SITE_OTHER): Payer: Medicare Other | Admitting: Internal Medicine

## 2022-01-03 ENCOUNTER — Telehealth: Payer: Self-pay

## 2022-01-03 ENCOUNTER — Encounter: Payer: Self-pay | Admitting: Internal Medicine

## 2022-01-03 ENCOUNTER — Ambulatory Visit
Admission: RE | Admit: 2022-01-03 | Discharge: 2022-01-03 | Disposition: A | Discharge status: Home / Self Care | Payer: Medicare Other | Source: Ambulatory Visit | Attending: Surgery | Admitting: Surgery

## 2022-01-03 ENCOUNTER — Other Ambulatory Visit: Payer: Self-pay

## 2022-01-03 VITALS — BP 122/64 | HR 62 | Temp 98.0°F | Ht 71.0 in | Wt 209.8 lb

## 2022-01-03 DIAGNOSIS — N3289 Other specified disorders of bladder: Secondary | ICD-10-CM | POA: Diagnosis not present

## 2022-01-03 DIAGNOSIS — T148XXA Other injury of unspecified body region, initial encounter: Secondary | ICD-10-CM

## 2022-01-03 DIAGNOSIS — K219 Gastro-esophageal reflux disease without esophagitis: Secondary | ICD-10-CM | POA: Diagnosis not present

## 2022-01-03 DIAGNOSIS — Z9189 Other specified personal risk factors, not elsewhere classified: Secondary | ICD-10-CM

## 2022-01-03 DIAGNOSIS — I2510 Atherosclerotic heart disease of native coronary artery without angina pectoris: Secondary | ICD-10-CM | POA: Diagnosis not present

## 2022-01-03 DIAGNOSIS — Z1329 Encounter for screening for other suspected endocrine disorder: Secondary | ICD-10-CM

## 2022-01-03 DIAGNOSIS — R7303 Prediabetes: Secondary | ICD-10-CM | POA: Diagnosis not present

## 2022-01-03 DIAGNOSIS — K4020 Bilateral inguinal hernia, without obstruction or gangrene, not specified as recurrent: Secondary | ICD-10-CM | POA: Insufficient documentation

## 2022-01-03 DIAGNOSIS — E559 Vitamin D deficiency, unspecified: Secondary | ICD-10-CM | POA: Diagnosis not present

## 2022-01-03 DIAGNOSIS — Z1389 Encounter for screening for other disorder: Secondary | ICD-10-CM

## 2022-01-03 DIAGNOSIS — R319 Hematuria, unspecified: Secondary | ICD-10-CM

## 2022-01-03 DIAGNOSIS — N281 Cyst of kidney, acquired: Secondary | ICD-10-CM | POA: Diagnosis not present

## 2022-01-03 DIAGNOSIS — M160 Bilateral primary osteoarthritis of hip: Secondary | ICD-10-CM | POA: Diagnosis not present

## 2022-01-03 DIAGNOSIS — D72829 Elevated white blood cell count, unspecified: Secondary | ICD-10-CM

## 2022-01-03 DIAGNOSIS — Z01818 Encounter for other preprocedural examination: Secondary | ICD-10-CM | POA: Diagnosis not present

## 2022-01-03 LAB — COMPREHENSIVE METABOLIC PANEL
ALT: 25 U/L (ref 0–53)
AST: 21 U/L (ref 0–37)
Albumin: 4.5 g/dL (ref 3.5–5.2)
Alkaline Phosphatase: 91 U/L (ref 39–117)
BUN: 18 mg/dL (ref 6–23)
CO2: 26 mEq/L (ref 19–32)
Calcium: 9.5 mg/dL (ref 8.4–10.5)
Chloride: 103 mEq/L (ref 96–112)
Creatinine, Ser: 1.07 mg/dL (ref 0.40–1.50)
GFR: 69.62 mL/min (ref >60.00)
Glucose, Bld: 100 mg/dL — ABNORMAL HIGH (ref 70–99)
Potassium: 4.4 mEq/L (ref 3.5–5.1)
Sodium: 136 mEq/L (ref 135–145)
Total Bilirubin: 0.9 mg/dL (ref 0.2–1.2)
Total Protein: 6.7 g/dL (ref 6.0–8.3)

## 2022-01-03 LAB — CBC WITH DIFFERENTIAL/PLATELET
Basophils Absolute: 0 10*3/uL (ref 0.0–0.1)
Basophils Relative: 0.3 % (ref 0.0–3.0)
Eosinophils Absolute: 0.1 10*3/uL (ref 0.0–0.7)
Eosinophils Relative: 1 % (ref 0.0–5.0)
HCT: 49.5 % (ref 39.0–52.0)
Hemoglobin: 16.4 g/dL (ref 13.0–17.0)
Lymphocytes Relative: 36.9 % (ref 12.0–46.0)
Lymphs Abs: 2.4 10*3/uL (ref 0.7–4.0)
MCHC: 33.2 g/dL (ref 30.0–36.0)
MCV: 93.5 fl (ref 78.0–100.0)
Monocytes Absolute: 0.6 10*3/uL (ref 0.1–1.0)
Monocytes Relative: 9.7 % (ref 3.0–12.0)
Neutro Abs: 3.5 10*3/uL (ref 1.4–7.7)
Neutrophils Relative %: 52.1 % (ref 43.0–77.0)
Platelets: 130 10*3/uL — ABNORMAL LOW (ref 150.0–400.0)
RBC: 5.29 Mil/uL (ref 4.22–5.81)
RDW: 16 % — ABNORMAL HIGH (ref 11.5–15.5)
WBC: 6.6 10*3/uL (ref 4.0–10.5)

## 2022-01-03 LAB — PROTIME-INR
INR: 1 ratio (ref 0.8–1.0)
Prothrombin Time: 11.1 s (ref 9.6–13.1)

## 2022-01-03 LAB — TSH: TSH: 1.5 u[IU]/mL (ref 0.35–5.50)

## 2022-01-03 LAB — VITAMIN D 25 HYDROXY (VIT D DEFICIENCY, FRACTURES): VITD: 38.09 ng/mL (ref 30.00–100.00)

## 2022-01-03 LAB — POCT I-STAT CREATININE: Creatinine, Ser: 1.3 mg/dL — ABNORMAL HIGH (ref 0.61–1.24)

## 2022-01-03 LAB — APTT: aPTT: 30.7 s (ref 23.4–32.7)

## 2022-01-03 LAB — HEMOGLOBIN A1C: Hgb A1c MFr Bld: 6.3 % (ref 4.6–6.5)

## 2022-01-03 MED ORDER — IOHEXOL 300 MG/ML  SOLN
100.0000 mL | Freq: Once | INTRAMUSCULAR | Status: AC | PRN
  Administered 2022-01-03: 100 mL via INTRAVENOUS

## 2022-01-03 MED ORDER — PANTOPRAZOLE SODIUM 40 MG PO TBEC: DELAYED_RELEASE_TABLET | ORAL | 3 refills | Status: DC

## 2022-01-03 NOTE — Telephone Encounter (Signed)
Medical Clearance received from [facility] -low risk - patient optimized for surgery- recommends Cardiac Clearance

## 2022-01-03 NOTE — Progress Notes (Signed)
Chief Complaint  Patient presents with   Follow-up   Pre-op Exam   Pre op clearance for b/l hernia inguinal repair 01/11/22  No chest pain Dr.Kelly cards cleared him last week and he will stop plavix 5-7 days before surgery  BP optimized and doing well on lotensin 40 mg qd and norvasc 10 mg qd    Review of Systems  Constitutional:  Negative for weight loss.  HENT:  Negative for hearing loss.   Eyes:  Negative for blurred vision.  Respiratory:  Negative for shortness of breath.   Cardiovascular:  Negative for chest pain.  Gastrointestinal:  Negative for abdominal pain and blood in stool.  Musculoskeletal:  Negative for back pain.  Skin:  Negative for rash.  Neurological:  Negative for headaches.  Psychiatric/Behavioral:  Negative for depression.   Past Medical History:  Diagnosis Date   CAD (coronary artery disease)    a. s/p CABG in 2000 with LIMA-LAD, RIMA-PDA, SVG-D1, and SVG-PLA b. low-risk NST in 2016   Cancer [facility])    skin cancer follows Dr. Kellie Moor    ED (erectile dysfunction)    GERD (gastroesophageal reflux disease)    Hyperlipidemia    Hypertension    Followed by Dr. Claiborne Billings   OSA on CPAP    Sinus bradycardia    Thrombocytopenia (HCC)    Tongue lesion    benign from trauma as of 06/20/21   Past Surgical History:  Procedure Laterality Date   CATARACT EXTRACTION, BILATERAL  2010   COLONOSCOPY WITH PROPOFOL N/A 04/15/2020   Procedure: COLONOSCOPY WITH PROPOFOL;  Surgeon: Robert Bellow, MD;  Location: [facility] ENDOSCOPY;  Service: Endoscopy;  Laterality: N/A;   CORONARY ARTERY BYPASS GRAFT  2000   LIMA to the LAD,RIMA to the PDA,vein graft to the diagonal & vein graft to the PLA   ESOPHAGOGASTRODUODENOSCOPY     Dr. Gustavo Lah   ESOPHAGOGASTRODUODENOSCOPY  04/15/2020   Procedure: ESOPHAGOGASTRODUODENOSCOPY (EGD);  Surgeon: Robert Bellow, MD;  Location: [facility] ENDOSCOPY;  Service: Endoscopy;;   HOLEP-LASER ENUCLEATION OF THE PROSTATE WITH MORCELLATION N/A 11/27/2021    Procedure: HOLEP-LASER ENUCLEATION OF THE PROSTATE WITH MORCELLATION;  Surgeon: Hollice Espy, MD;  Location: [facility] ORS;  Service: Urology;  Laterality: N/A;   KNEE ARTHROSCOPY Right 1991   KNEE DEBRIDEMENT Right 1989   Fluid flush   PENILE PROSTHESIS IMPLANT     06/26/19 Dr. Francesca Jewett   SHOULDER ARTHROSCOPY Left 2011   US ECHOCARDIOGRAPHY  12/12/2010   mild LA dilatation, normal LV systolic fx, EF > 29%   Family History  Problem Relation Age of Onset   Arthritis Mother    Heart disease Father        CAD - MI   Stroke Father    Hypertension Father    Cancer Father 69       esophageal cancer    Arthritis Maternal Grandmother    Social History   Socioeconomic History   Marital status: Married    Spouse name: Not on file   Number of children: 3   Years of education: 16   Highest education level: Not on file  Occupational History   Occupation: Retired Corporate treasurer   Occupation: Management/Technical Support    Comment: Psychiatric nurse  Tobacco Use   Smoking status: Every Day    Packs/day: 0.50    Years: 44.00    Pack years: 22.00    Types: Cigarettes   Smokeless tobacco: Never   Tobacco comments:    on and off smoker  Vaping Use   Vaping Use: Never used  Substance and Sexual Activity   Alcohol use: Yes    Alcohol/week: 10.0 standard drinks    Types: 10 Cans of beer per week    Comment: Down to 3 beers a day from 4-6 a day    Drug use: No   Sexual activity: Yes    Partners: Female    Comment: Wife  Other Topics Concern   Not on file  Social History Narrative   Fitzroy grew up in [place]. He is currently living in [place] with his wife. This is his second marriage. He has 1 daughter and 2 sons from his first marriage. He has 4 step kids. His daughter lives in [place], one son in [place], [place] and the other son in [place], [place]. 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    He enjoys wood working on  his spare time. 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       He retired 04/2019 and enjoys this       2 brothers and 2 sisters he is next to youngest    Social Determinants of Radio broadcast assistant Strain: Not on file  Food Insecurity: Not on file  Transportation Needs: Not on file  Physical Activity: Not on file  Stress: Not on file  Social Connections: Not on file  Intimate Partner Violence: Not on file   Current Meds  Medication Sig   amLODipine (NORVASC) 10 MG tablet Take 1 tablet (10 mg total) by mouth daily. (Patient taking differently: Take 10 mg by mouth every evening.)   atorvastatin (LIPITOR) 40 MG tablet Take 1 tablet (40 mg total) by mouth daily. At night (Patient taking differently: Take 40 mg by mouth every evening. At night)   benazepril (LOTENSIN) 40 MG tablet Take 1 tablet (40 mg total) by mouth daily. (Patient taking differently: Take 40 mg by mouth every evening.)   clopidogrel (PLAVIX) 75 MG tablet Take 1 tablet (75 mg total) by mouth daily. (Patient taking differently: Take 75 mg by mouth every evening.)   ezetimibe (ZETIA) 10 MG tablet Take 1 tablet (10 mg total) by mouth daily. (Patient taking differently: Take 10 mg by mouth every evening.)   ibuprofen (ADVIL) 200 MG tablet Take 400 mg by mouth every 8 (eight) hours as needed (pain.).   Multiple Vitamins-Minerals (MULTIVITAMIN WITH MINERALS) tablet Take 1 tablet by mouth every evening. Men 50+   NEEDLE, DISP, 18 G (BD SAFETYGLIDE NEEDLE) 18G X 1-1/2" MISC Draw up medication with this needle   silodosin (RAPAFLO) 8 MG CAPS capsule Take 1 capsule (8 mg total) by mouth daily with breakfast.   SYRINGE-NEEDLE, DISP, 3 ML 23G X 1-1/2" 3 ML MISC Use as directed QOWEEK   testosterone cypionate (DEPOTESTOSTERONE CYPIONATE) 200 MG/ML injection ADMINISTER 1.5 ML(300 MG) IN THE MUSCLE EVERY 14 DAYS   [DISCONTINUED] pantoprazole  (PROTONIX) 40 MG tablet TAKE 1 TABLET BY MOUTH EVERY DAY 30 MINUTES BEFORE A MEAL (Patient taking differently: 40 mg every evening. TAKE 1 TABLET BY MOUTH EVERY DAY 30 MINUTES BEFORE A MEAL)   Allergies  Allergen Reactions   Tamsulosin Other (See Comments)    Chest discomfort, basal dilation with a warm feeling in chest    Recent Results (from the past 2160 hour(s))  Urinalysis, Complete     Status: Abnormal   Collection Time: 10/09/21  8:46 AM  Result Value Ref Range   Specific Gravity, UA 1.020 1.005 - 1.030   pH, UA 6.0 5.0 - 7.5   Color, UA Yellow Yellow   Appearance Ur Clear Clear   Leukocytes,UA Trace (A) Negative   Protein,UA Negative Negative/Trace   Glucose, UA Negative Negative   Ketones, UA Negative Negative   RBC, UA Trace (A) Negative   Bilirubin, UA Negative Negative   Urobilinogen, Ur 0.2 0.2 - 1.0 mg/dL   Nitrite, UA Negative Negative   Microscopic Examination See below:   Microscopic Examination     Status: Abnormal   Collection Time: 10/09/21  8:46 AM   Urine  Result Value Ref Range   WBC, UA 0-5 0 - 5 /hpf   RBC 3-10 (A) 0 - 2 /hpf   Epithelial Cells (non renal) 0-10 0 - 10 /hpf   Bacteria, UA None seen None seen/Few  NM Myocar Multi W/Spect W/Wall Motion / EF     Status: None   Collection Time: 10/13/21 10:05 AM  Result Value Ref Range   Rest HR 53.0 bpm   Rest BP 149/70 mmHg   Exercise duration (min) 0 min   Exercise duration (sec) 0 sec   Estimated workload 1.0    Peak HR 81 bpm   Peak BP 159/65 mmHg   MPHR 149 bpm   Percent HR 54.0 %   ST Depression (mm) 0 mm   Rest Nuclear Isotope Dose 10.4 mCi   Stress Nuclear Isotope Dose 30.9 mCi   SSS 1.0    SRS 0.0    SDS 0.0    TID 0.93    LV sys vol 65.0 mL   LV dias vol 159.0 62 - 150 mL   Nuc Stress EF 59 %  Urinalysis, Complete     Status: None   Collection Time: 10/18/21  4:22 PM  Result Value Ref Range   Specific Gravity, UA 1.015 1.005 - 1.030   pH, UA 6.5 5.0 - 7.5   Color, UA Yellow  Yellow   Appearance Ur Clear Clear   Leukocytes,UA Negative Negative   Protein,UA Negative Negative/Trace   Glucose, UA Negative Negative   Ketones, UA Negative Negative   RBC, UA Negative Negative   Bilirubin, UA Negative Negative   Urobilinogen, Ur 0.2 0.2 - 1.0 mg/dL   Nitrite, UA Negative Negative   Microscopic Examination See below:   Microscopic Examination     Status: None   Collection Time: 10/18/21  4:22 PM   Urine  Result Value Ref Range   WBC, UA 0-5 0 - 5 /hpf   RBC None seen 0 - 2 /hpf   Epithelial Cells (non renal) 0-10 0 - 10 /hpf   Bacteria, UA None seen None seen/Few  CULTURE, URINE COMPREHENSIVE     Status: Abnormal   Collection Time: 10/18/21  4:31 PM   Specimen: Urine   UR  Result Value Ref Range   Urine Culture, Comprehensive Final report (A)    Organism ID, Bacteria Comment (A)     Comment: Staphylococcus epidermidis Based on resistance to oxacillin this isolate would be resistant to all currently available beta-lactam antimicrobial agents, with the exception of the newer cephalosporins with anti-MRSA activity, such as Ceftaroline 3,000 Colonies/mL    ANTIMICROBIAL SUSCEPTIBILITY Comment     Comment:       ** S = Susceptible; I = Intermediate; R =  Resistant **                    P = Positive; N = Negative             MICS are expressed in micrograms per mL    Antibiotic                 RSLT#1    RSLT#2    RSLT#3    RSLT#4 Ciprofloxacin                  S Gentamicin                     S Levofloxacin                   S Linezolid                      S Moxifloxacin                   S Nitrofurantoin                 S Oxacillin                      R Penicillin                     R Quinupristin/Dalfopristin      S Rifampin                       S Tetracycline                   S Trimethoprim/Sulfa             R Vancomycin                     S   Hemoglobin and Hematocrit, Blood     Status: None   Collection Time: 11/22/21 12:38 PM  Result  Value Ref Range   Hemoglobin 16.7 13.0 - 17.0 g/dL   HCT 49.3 39.0 - 52.0 %    Comment: Performed at [facility], [address], [place], [place] [address]  Surgical pathology     Status: None   Collection Time: 11/27/21 11:08 AM  Result Value Ref Range   SURGICAL PATHOLOGY      SURGICAL PATHOLOGY CASE: [id number] PATIENT: Shakur Kabler Surgical Pathology Report     Specimen Submitted: A. Prostate chips  Clinical History: BPH with urinary obstruction    DIAGNOSIS: A. PROSTATE GLAND; ENUCLEATION: - BENIGN PROSTATIC TISSUE WITH NODULAR HYPERTROPHY (BENIGN PROSTATIC HYPERPLASIA). - NEGATIVE FOR MALIGNANCY.  GROSS DESCRIPTION: A. Labeled: Prostate chips Received: Fresh Collection time: 11:08 AM on 11/27/2021 Placed into formalin time: 11:33 AM on 11/27/2021 Type of procedure: Enucleation of the prostate with morcellation Weight: 26.8 grams Size: 5.3 x 5.3 x 2.3 cm Description: Tan-pink, rubbery tissue fragments Percentage of tissue submitted for microscopic review: Approximately 60%  Block summary: 1 -9-representative sections  CM 11/27/2021  Final Diagnosis performed by Allena Napoleon, MD.   Electronically signed 11/28/2021 4:10:06PM The electronic signature indicates that the named Attending Pathologist has evaluated the spe cimen Technical component performed at [facility], [address], [place], [place] [address] Lab: [phone] Dir: Rush Farmer, MD, MMM  Professional component performed at [facility], [facility], [place], [place], [place] [address] Lab: [place]  Clent Ridges, MD   Lactic acid, plasma     Status: None   Collection Time: 11/28/21  7:28 PM  Result Value Ref Range   Lactic Acid, Venous 1.3 0.5 - 1.9 mmol/L    Comment: Performed at [facility], [address] [place], [place] [address]  Comprehensive metabolic panel     Status: Abnormal   Collection Time: 11/28/21  7:28 PM  Result Value  Ref Range   Sodium 134 (L) 135 - 145 mmol/L   Potassium 3.7 3.5 - 5.1 mmol/L   Chloride 102 98 - 111 mmol/L   CO2 22 22 - 32 mmol/L   Glucose, Bld 99 70 - 99 mg/dL    Comment: Glucose reference range applies only to samples taken after fasting for at least 8 hours.   BUN 15 8 - 23 mg/dL   Creatinine, Ser 0.97 0.61 - 1.24 mg/dL   Calcium 8.8 (L) 8.9 - 10.3 mg/dL   Total Protein 6.9 6.5 - 8.1 g/dL   Albumin 4.0 3.5 - 5.0 g/dL   AST 24 15 - 41 U/L   ALT 23 0 - 44 U/L   Alkaline Phosphatase 67 38 - 126 U/L   Total Bilirubin 0.8 0.3 - 1.2 mg/dL   GFR, Estimated >60 >60 mL/min    Comment: (NOTE) Calculated using the CKD-EPI Creatinine Equation (2021)    Anion gap 10 5 - 15    Comment: Performed at [facility], [address] [place], [place] [address]  CBC with Differential     Status: Abnormal   Collection Time: 11/28/21  7:28 PM  Result Value Ref Range   WBC 13.5 (H) 4.0 - 10.5 K/uL   RBC 5.13 4.22 - 5.81 MIL/uL   Hemoglobin 16.0 13.0 - 17.0 g/dL   HCT 47.1 39.0 - 52.0 %   MCV 91.8 80.0 - 100.0 fL   MCH 31.2 26.0 - 34.0 pg   MCHC 34.0 30.0 - 36.0 g/dL   RDW 15.0 11.5 - 15.5 %   Platelets 115 (L) 150 - 400 K/uL    Comment: Immature Platelet Fraction may be clinically indicated, consider ordering this additional test [id number] REPEATED TO VERIFY PLATELET COUNT CONFIRMED BY SMEAR    nRBC 0.0 0.0 - 0.2 %   Neutrophils Relative % 73 %   Neutro Abs 9.8 (H) 1.7 - 7.7 K/uL   Lymphocytes Relative 14 %   Lymphs Abs 1.9 0.7 - 4.0 K/uL   Monocytes Relative 13 %   Monocytes Absolute 1.7 (H) 0.1 - 1.0 K/uL   Eosinophils Relative 0 %   Eosinophils Absolute 0.0 0.0 - 0.5 K/uL   Basophils Relative 0 %   Basophils Absolute 0.0 0.0 - 0.1 K/uL   WBC Morphology TOXIC GRANULATION    Smear Review Reviewed    Immature Granulocytes 0 %   Abs Immature Granulocytes 0.06 0.00 - 0.07 K/uL   Polychromasia PRESENT     Comment: Performed at [facility], [address] [place], [place] [address]  Urinalysis, Routine w reflex microscopic Urine, Catheterized     Status: Abnormal   Collection Time: 11/28/21  7:29 PM  Result Value Ref Range   Color, Urine YELLOW (A) YELLOW   APPearance CLEAR (A) CLEAR   Specific Gravity, Urine 1.005 1.005 - 1.030   pH 7.0 5.0 - 8.0   Glucose, UA NEGATIVE NEGATIVE mg/dL   Hgb urine dipstick LARGE (A) NEGATIVE   Bilirubin Urine NEGATIVE NEGATIVE   Ketones, ur  NEGATIVE NEGATIVE mg/dL   Protein, ur 30 (A) NEGATIVE mg/dL   Nitrite NEGATIVE NEGATIVE   Leukocytes,Ua LARGE (A) NEGATIVE   RBC / HPF 21-50 0 - 5 RBC/hpf   WBC, UA 21-50 0 - 5 WBC/hpf   Bacteria, UA RARE (A) NONE SEEN   Squamous Epithelial / LPF NONE SEEN 0 - 5   Mucus PRESENT     Comment: Performed at [facility], [address], [place], [place] [address]  Resp Panel by RT-PCR (Flu A&B, Covid) Nasopharyngeal Swab     Status: None   Collection Time: 11/28/21  9:41 PM   Specimen: Nasopharyngeal Swab; Nasopharyngeal(NP) swabs in vial transport medium  Result Value Ref Range   SARS Coronavirus 2 by RT PCR NEGATIVE NEGATIVE    Comment: (NOTE) SARS-CoV-2 target nucleic acids are NOT DETECTED.  The SARS-CoV-2 RNA is generally detectable in upper respiratory specimens during the acute phase of infection. The lowest concentration of SARS-CoV-2 viral copies this assay can detect is 138 copies/mL. A negative result does not preclude SARS-Cov-2 infection and should not be used as the sole basis for treatment or other patient management decisions. A negative result may occur with  improper specimen collection/handling, submission of specimen other than nasopharyngeal swab, presence of viral mutation(s) within the areas targeted by this assay, and inadequate number of viral copies(<138 copies/mL). A negative result must be combined with clinical observations, patient history, and epidemiological information. The expected result is Negative.  Fact Sheet for  Patients:  EntrepreneurPulse.com.au  Fact Sheet for Healthcare Providers:  IncredibleEmployment.be  This test is no t yet approved or cleared by the [place] FDA and  has been authorized for detection and/or diagnosis of SARS-CoV-2 by FDA under an Emergency Use Authorization (EUA). This EUA will remain  in effect (meaning this test can be used) for the duration of the COVID-19 declaration under Section 564(b)(1) of the Act, 21 U.S.C.section 360bbb-3(b)(1), unless the authorization is terminated  or revoked sooner.       Influenza A by PCR NEGATIVE NEGATIVE   Influenza B by PCR NEGATIVE NEGATIVE    Comment: (NOTE) The Xpert Xpress SARS-CoV-2/FLU/RSV plus assay is intended as an aid in the diagnosis of influenza from Nasopharyngeal swab specimens and should not be used as a sole basis for treatment. Nasal washings and aspirates are unacceptable for Xpert Xpress SARS-CoV-2/FLU/RSV testing.  Fact Sheet for Patients: EntrepreneurPulse.com.au  Fact Sheet for Healthcare Providers: IncredibleEmployment.be  This test is not yet approved or cleared by the [place] FDA and has been authorized for detection and/or diagnosis of SARS-CoV-2 by FDA under an Emergency Use Authorization (EUA). This EUA will remain in effect (meaning this test can be used) for the duration of the COVID-19 declaration under Section 564(b)(1) of the Act, 21 U.S.C. section 360bbb-3(b)(1), unless the authorization is terminated or revoked.  Performed at [facility], [address], [place], [place] [address]   Urine Culture     Status: Abnormal   Collection Time: 11/28/21  9:42 PM   Specimen: Urine, Catheterized  Result Value Ref Range   Specimen Description      URINE, CATHETERIZED Performed at [facility], [address] [place], [place] [address]    Special Requests      NONE Performed at [facility], [address] [place], [place] [address]    Culture 90,000 COLONIES/mL ENTEROCOCCUS FAECALIS (A)    Report Status 12/01/2021 FINAL    Organism ID, Bacteria ENTEROCOCCUS FAECALIS (  A)       Susceptibility   Enterococcus faecalis - MIC*    AMPICILLIN <=2 SENSITIVE Sensitive     NITROFURANTOIN <=16 SENSITIVE Sensitive     VANCOMYCIN 1 SENSITIVE Sensitive     * 90,000 COLONIES/mL ENTEROCOCCUS FAECALIS  Urinalysis, Routine w reflex microscopic     Status: Abnormal   Collection Time: 12/01/21  3:22 AM  Result Value Ref Range   Color, Urine YELLOW (A) YELLOW   APPearance HAZY (A) CLEAR   Specific Gravity, Urine 1.016 1.005 - 1.030   pH 7.0 5.0 - 8.0   Glucose, UA NEGATIVE NEGATIVE mg/dL   Hgb urine dipstick LARGE (A) NEGATIVE   Bilirubin Urine NEGATIVE NEGATIVE   Ketones, ur 5 (A) NEGATIVE mg/dL   Protein, ur 30 (A) NEGATIVE mg/dL   Nitrite NEGATIVE NEGATIVE   Leukocytes,Ua MODERATE (A) NEGATIVE   RBC / HPF 21-50 0 - 5 RBC/hpf   WBC, UA 21-50 0 - 5 WBC/hpf   Bacteria, UA NONE SEEN NONE SEEN   Squamous Epithelial / LPF NONE SEEN 0 - 5   Mucus PRESENT     Comment: Performed at [facility], [address] [place], [place] [address]  CBC with Differential     Status: Abnormal   Collection Time: 12/01/21  3:22 AM  Result Value Ref Range   WBC 14.5 (H) 4.0 - 10.5 K/uL   RBC 4.78 4.22 - 5.81 MIL/uL   Hemoglobin 14.8 13.0 - 17.0 g/dL   HCT 44.1 39.0 - 52.0 %   MCV 92.3 80.0 - 100.0 fL   MCH 31.0 26.0 - 34.0 pg   MCHC 33.6 30.0 - 36.0 g/dL   RDW 15.1 11.5 - 15.5 %   Platelets 96 (L) 150 - 400 K/uL    Comment: Immature Platelet Fraction may be clinically indicated, consider ordering this additional test [id number] REPEATED TO VERIFY    nRBC 0.0 0.0 - 0.2 %   Neutrophils Relative % 78 %   Neutro Abs 11.2 (H) 1.7 - 7.7 K/uL   Lymphocytes Relative 9 %   Lymphs Abs 1.3 0.7 - 4.0 K/uL   Monocytes Relative 12 %   Monocytes Absolute 1.8 (H) 0.1 - 1.0 K/uL    Eosinophils Relative 0 %   Eosinophils Absolute 0.0 0.0 - 0.5 K/uL   Basophils Relative 0 %   Basophils Absolute 0.0 0.0 - 0.1 K/uL   Immature Granulocytes 1 %   Abs Immature Granulocytes 0.10 (H) 0.00 - 0.07 K/uL    Comment: Performed at [facility], [address] [place], [place] [address]  Basic metabolic panel     Status: Abnormal   Collection Time: 12/01/21  3:22 AM  Result Value Ref Range   Sodium 133 (L) 135 - 145 mmol/L   Potassium 3.7 3.5 - 5.1 mmol/L   Chloride 106 98 - 111 mmol/L   CO2 21 (L) 22 - 32 mmol/L   Glucose, Bld 133 (H) 70 - 99 mg/dL    Comment: Glucose reference range applies only to samples taken after fasting for at least 8 hours.   BUN 20 8 - 23 mg/dL   Creatinine, Ser 1.20 0.61 - 1.24 mg/dL   Calcium 8.2 (L) 8.9 - 10.3 mg/dL   GFR, Estimated >60 >60 mL/min    Comment: (NOTE) Calculated using the CKD-EPI Creatinine Equation (2021)    Anion gap 6 5 - 15    Comment: Performed at [facility], [address] [place], [place]  [address]  BLADDER SCAN AMB NON-IMAGING     Status: None   Collection Time: 01/02/22  9:31 AM  Result Value Ref Range   Scan Result 62 ml    Objective  Body mass index is 29.26 kg/m. Wt Readings from Last 3 Encounters:  01/03/22 209 lb 12.8 oz (95.2 kg)  01/02/22 208 lb (94.3 kg)  12/25/21 208 lb 3.2 oz (94.4 kg)   Temp Readings from Last 3 Encounters:  01/03/22 98 F (36.7 C) (Oral)  12/25/21 98.7 F (37.1 C) (Oral)  12/01/21 98.9 F (37.2 C) (Oral)   BP Readings from Last 3 Encounters:  01/03/22 122/64  01/02/22 (!) 144/77  12/25/21 (!) 148/75   Pulse Readings from Last 3 Encounters:  01/03/22 62  01/02/22 61  12/25/21 62    Physical Exam Vitals and nursing note reviewed.  Constitutional:      Appearance: Normal appearance. He is well-developed and well-groomed.  HENT:     Head: Normocephalic and atraumatic.  Eyes:     Conjunctiva/sclera: Conjunctivae normal.     Pupils: Pupils are  equal, round, and reactive to light.  Cardiovascular:     Rate and Rhythm: Normal rate and regular rhythm.     Heart sounds: Normal heart sounds.  Pulmonary:     Effort: Pulmonary effort is normal. No respiratory distress.     Breath sounds: Normal breath sounds.  Abdominal:     Tenderness: There is no abdominal tenderness.  Skin:    General: Skin is warm and moist.  Neurological:     General: No focal deficit present.     Mental Status: He is alert and oriented to person, place, and time. Mental status is at baseline.     Sensory: Sensation is intact.     Motor: Motor function is intact.     Coordination: Coordination is intact.     Gait: Gait is intact. Gait normal.  Psychiatric:        Attention and Perception: Attention and perception normal.        Mood and Affect: Mood and affect normal.        Speech: Speech normal.        Behavior: Behavior normal. Behavior is cooperative.        Thought Content: Thought content normal.        Cognition and Memory: Cognition and memory normal.        Judgment: Judgment normal.    Assessment  Plan  Pre-op evaluation - Plan: Comprehensive metabolic panel, CBC w/Diff, Urinalysis, Routine w reflex microscopic, APTT, Protime-INR, A1C   Leukocytosis, unspecified type - Plan: CBC w/Diff, Pathologist smear review Fu Dr. Tasia Catchings   At high risk for bleeding - Plan: APTT, Protime-INR before surgery   Hm Declined flu shot Had prevnar, zostervax, utd pna 23 vaccine Disc shingrix for future and Tdap given Rx given today 06/20/21  Disc hep A/B vaccine prev given Rx previously with fatty liver  Pfizer 2/2 consider booster 06/20/21 pt declines      Had PSA 1.7 09/2020  Results for GADGE, HERMIZ (MRN [id number]) as of 12/21/2020 10:19   Ref. Range 09/14/2020 09:13  Prostate Specific Ag, Serum Latest Ref Range: 0.0 - 4.0 ng/mL 1.7   labs 03/2022 urology Dr. Bernardo Heater   Hep C neg  Has mvt with vitamin D3    CT chest had 01/2021 rec smoking cessation   IMPRESSION: 1. Lung-RADS 2, benign appearance or behavior. Continue annual screening with low-dose chest CT without  contrast in 12 months. 2. Stable 4.3 cm ascending thoracic aortic diameter. Recommend attention on follow-up annual screening chest CT. Aortic aneurysm NOS (ICD10-I71.9) 3.  Emphysema (ICD10-J43.9) and Aortic Atherosclerosis (ICD10-170.0)     Electronically Signed   By: Misty Stanley M.D.   On: 01/12/2021 14:14   US abdomen 12/10/17 neg normal spleen size, +fatty liver, 1.3 cm right kidney cyst    Colonoscopy had 02/16/14 tubular adenoma h/o polyps repeat in 5 years [facility] pt will call  [facility] EGD/colonoscopy 04/15/20 repeat Q5 years Dr. Bary Castilla colonoscopy + hemorrhoids, and EGD with bx reflux esophagitis Q5 years  egd/colonoscopy 04/15/20 utd    Sees derm Dr. Kellie Moor saw 2019 SCC left arm x 2 f/u for 2020-2021 bx bcc forearm left sch spring 2021  right nose bx negative Q6 months to 1 year  due spring 3 or 02/2021    Feeling great f/u [facility] Dr. Patrick North [phone] fax [phone] benefits CPAP f/u in 1 year     Local meds [facility] protonix, wellbutrin [facility]    Provider: Dr. [name] [name]-Internal Medicine

## 2022-01-03 NOTE — Telephone Encounter (Signed)
Cardiac Clearance faxed to Dr.Thomas Kelly at this time.

## 2022-01-04 ENCOUNTER — Other Ambulatory Visit: Payer: Self-pay

## 2022-01-04 ENCOUNTER — Encounter: Payer: Self-pay | Admitting: *Deleted

## 2022-01-04 ENCOUNTER — Encounter
Admission: RE | Admit: 2022-01-04 | Discharge: 2022-01-04 | Disposition: A | Discharge status: Home / Self Care | Payer: Medicare Other | Source: Ambulatory Visit | Attending: Surgery | Admitting: Surgery

## 2022-01-04 ENCOUNTER — Other Ambulatory Visit: Payer: Self-pay | Admitting: Internal Medicine

## 2022-01-04 DIAGNOSIS — R319 Hematuria, unspecified: Secondary | ICD-10-CM | POA: Diagnosis not present

## 2022-01-04 HISTORY — DX: Fatty (change of) liver, not elsewhere classified: K76.0

## 2022-01-04 HISTORY — DX: Cyst of kidney, acquired: N28.1

## 2022-01-04 HISTORY — DX: Prediabetes: R73.03

## 2022-01-04 HISTORY — DX: Other specified abnormal findings of blood chemistry: R79.89

## 2022-01-04 HISTORY — DX: Occlusion and stenosis of unspecified carotid artery: I65.29

## 2022-01-04 HISTORY — DX: Bradycardia, unspecified: R00.1

## 2022-01-04 LAB — URINALYSIS, ROUTINE W REFLEX MICROSCOPIC
Bacteria, UA: NONE SEEN /HPF
Bilirubin Urine: NEGATIVE
Glucose, UA: NEGATIVE
Hyaline Cast: NONE SEEN /LPF
Ketones, ur: NEGATIVE
Nitrite: NEGATIVE
Protein, ur: NEGATIVE
Specific Gravity, Urine: 1.009 (ref 1.001–1.035)
Squamous Epithelial / HPF: NONE SEEN /HPF (ref <=5)
pH: 6.5 (ref 5.0–8.0)

## 2022-01-04 LAB — MICROSCOPIC MESSAGE

## 2022-01-04 LAB — PATHOLOGIST SMEAR REVIEW

## 2022-01-04 NOTE — Patient Instructions (Addendum)
Your procedure is scheduled on:01-11-22 Thursday Report to the Registration Desk on the 1st floor of the [facility].Then proceed to the 2nd floor Surgery Desk in the [facility] To find out your arrival time, please call [phone] between 1PM - 3PM on:01-10-22 Wednesday  REMEMBER: Instructions that are not followed completely may result in serious medical risk, up to and including death; or upon the discretion of your surgeon and anesthesiologist your surgery may need to be rescheduled.  Do not eat food after midnight the night before surgery.  No gum chewing, lozengers or hard candies.  You may however, drink CLEAR liquids up to 2 hours before you are scheduled to arrive for your surgery. Do not drink anything within 2 hours of your scheduled arrival time.  Clear liquids include: - water  - apple juice without pulp - gatorade (not RED colors) - black coffee or tea (Do NOT add milk or creamers to the coffee or tea) Do NOT drink anything that is not on this list.  TAKE THESE MEDICATIONS THE MORNING OF SURGERY WITH A SIP OF WATER: -pantoprazole (PROTONIX)  Stop clopidogrel (PLAVIX) 7 days prior to surgery-Last dose of clopidogrel (PLAVIX) was on 01-03-22 as instructed by Dr Hampton Abbot  One week prior to surgery: Last dose 01-04-22 Stop Anti-inflammatories (NSAIDS) such as Advil, Aleve, Ibuprofen, Motrin, Naproxen, Naprosyn and Aspirin based products such as Excedrin, Goodys Powder, BC Powder.You may however, continue to take Tylenol if needed for pain up until the day of surgery.  Stop ANY OVER THE COUNTER supplements/vitamins NOW (01-04-22) until after surgery (Multivitamin)  No Alcohol for 24 hours before or after surgery.  No Smoking including e-cigarettes for 24 hours prior to surgery.  No chewable tobacco products for at least 6 hours prior to surgery.  No nicotine patches on the day of surgery.  Do not use any "recreational" drugs for at least a week prior to your surgery.   Please be advised that the combination of cocaine and anesthesia may have negative outcomes, up to and including death. If you test positive for cocaine, your surgery will be cancelled.  On the morning of surgery brush your teeth with toothpaste and water, you may rinse your mouth with mouthwash if you wish. Do not swallow any toothpaste or mouthwash.  Use CHG Soap as directed on instruction sheet.  Do not wear jewelry, make-up, hairpins, clips or nail polish.  Do not wear lotions, powders, or perfumes.   Do not shave body from the neck down 48 hours prior to surgery just in case you cut yourself which could leave a site for infection.  Also, freshly shaved skin may become irritated if using the CHG soap.  Contact lenses, hearing aids and dentures may not be worn into surgery.  Do not bring valuables to the hospital. [facility] is not responsible for any missing/lost belongings or valuables.   Bring your C-PAP to the hospital with you  Notify your doctor if there is any change in your medical condition (cold, fever, infection).  Wear comfortable clothing (specific to your surgery type) to the hospital.  After surgery, you can help prevent lung complications by doing breathing exercises.  Take deep breaths and cough every 1-2 hours. Your doctor may order a device called an Incentive Spirometer to help you take deep breaths. When coughing or sneezing, hold a pillow firmly against your incision with both hands. This is called splinting. Doing this helps protect your incision. It also decreases belly discomfort.  If  you are being admitted to the hospital overnight, leave your suitcase in the car. After surgery it may be brought to your room.  If you are being discharged the day of surgery, you will not be allowed to drive home. You will need a responsible adult (18 years or older) to drive you home and stay with you that night.   If you are taking public transportation, you will  need to have a responsible adult (18 years or older) with you. Please confirm with your physician that it is acceptable to use public transportation.   Please call the [facility] Dept. at [phone] if you have any questions about these instructions.  Surgery Visitation Policy:  Patients undergoing a surgery or procedure may have one family member or support person with them as long as that person is not COVID-19 positive or experiencing its symptoms.  That person may remain in the waiting area during the procedure and may rotate out with other people.  Inpatient Visitation:    Visiting hours are 7 a.m. to 8 p.m. Up to two visitors ages 16+ are allowed at one time in a patient room. The visitors may rotate out with other people during the day. Visitors must check out when they leave, or other visitors will not be allowed. One designated support person may remain overnight. The visitor must pass COVID-19 screenings, use hand sanitizer when entering and exiting the patients room and wear a mask at all times, including in the patients room. Patients must also wear a mask when staff or their visitor are in the room. Masking is required regardless of vaccination status.

## 2022-01-04 NOTE — Telephone Encounter (Signed)
This encounter was created in error - please disregard.

## 2022-01-04 NOTE — Telephone Encounter (Deleted)
Request for pre-operative cardiac clearance Received: Today Karen Kitchens, NP  P Cv Div Preop Callback Request for pre-operative cardiac clearance:     1. What type of surgery is being performed?  ROBOTIC ASSISTED INGUINAL HERNIA REPAIR WITH MESH; HERNIA REPAIR UMBILICAL   2. When is this surgery scheduled?  01/11/2022     3. Type of clearance being requested (medical, pharmacy, both).  BOTH     4. Are there any medications that need to be held prior to surgery?  CLOPIDOGREL   5. Practice name and name of physician performing surgery?  Performing surgeon: Dr. Olean Ree, MD  Requesting clearance: Honor Loh, FNP-C       6. Anesthesia type (none, local, MAC, general)?  GENERAL   7. What is the office phone and fax number?    Phone: [phone]  Fax: [phone]   ATTENTION: Unable to create telephone message as per your standard workflow. Directed by [facility] providers to send requests for cardiac clearance to this pool for appropriate distribution to provider covering pre-operative clearances.   Honor Loh, MSN, APRN, FNP-C, CEN  [facility]  Peri-operative Services Nurse Practitioner  Phone: [phone]  01/04/22 4:20 PM

## 2022-01-04 NOTE — Telephone Encounter (Signed)
I will re-fax clearance to requesting office as it looks like clearance was 12/26/21.

## 2022-01-05 ENCOUNTER — Encounter: Payer: Self-pay | Admitting: Surgery

## 2022-01-05 LAB — URINE CULTURE
MICRO NUMBER:: 13048657
SPECIMEN QUALITY:: ADEQUATE

## 2022-01-05 NOTE — Progress Notes (Signed)
Perioperative Services  Pre-Admission/Anesthesia Testing Clinical Review  Date: 01/05/22  Patient Demographics:  Name: Christian Cole DOB:   16-Sep-1951 MRN:   [id number]  Planned Surgical Procedure(s):    Case: [id number] Date/Time: 01/11/22 0915   Procedures:      XI ROBOTIC ASSISTED INGUINAL HERNIA REPAIR WITH MESH (Bilateral)     HERNIA REPAIR UMBILICAL ADULT   Anesthesia type: General   Pre-op diagnosis: Bilateral inguinal hernias, umbilical hernia   Location: [facility] OR ROOM 04 / [facility]   Surgeons: Olean Ree, MD   NOTE: Available PAT nursing documentation and vital signs have been reviewed. Clinical nursing staff has updated patient's PMH/PSHx, current medication list, and drug allergies/intolerances to ensure comprehensive history available to assist in medical decision making as it pertains to the aforementioned surgical procedure and anticipated anesthetic course. Extensive review of available clinical information performed. [place] and PSHx updated with any diagnoses/procedures that  may have been inadvertently omitted during his intake with the pre-admission testing department's nursing staff.  Clinical Discussion:  Christian Cole is a 71 y.o. male who is submitted for pre-surgical anesthesia review and clearance prior to him undergoing the above procedure. Patient is a Current Smoker. Pertinent PMH includes: CAD (s/p CABG), sinus bradycardia, carotid artery stenosis, aortic atherosclerosis, HTN, HLD, prediabetes, OSAH (requires nocturnal PAP therapy), GERD (on daily PPI), erectile dysfunction (on TRT), thrombocytopenia, secondary erythrocytosis.  Patient is followed by cardiology Claiborne Billings, MD). He was last seen in the cardiology clinic on 11/20/2021; notes reviewed. At the time of his clinic visit, the patient denied any chest pain, shortness of breath, PND, orthopnea, palpitations, significant peripheral edema, vertiginous symptoms, or  presyncope/syncope.  Patient with a PMH significant for cardiovascular diagnoses.  Patient underwent a 4 vessel CABG in 2000.  LIMA-LAD, RIMA-PDA, SVG-D1, and SVG-PLA bypass grafts were placed.  TTE was performed on 12/12/2010 revealing a normal left ventricular systolic function with an EF of 53%. There was no significant valvular regurgitation or transvalvular gradient to suggest stenosis.  There was mild left atrial dilatation.  Last myocardial perfusion imaging study was performed on 10/13/2021 revealing a mildly reduced left ventricular systolic function with an EF of 49%. CT attenuation correction images there was no evidence of stress-induced myocardial ischemia or arrhythmia.  Study determined to be normal and low risk.Revealed moderate diffuse aortic atherosclerosis and coronary artery calcification.  Patient on daily antiplatelet therapy using clopidogrel; compliant with therapy with no evidence or reports of GI bleeding.  Blood pressure reasonably controlled at 130/70 on currently prescribed CCB and ACEi therapies.  Patient is on a statin + ezetimibe for his HLD and further ASCVD prevention.  Patient with a prediabetes diagnosis; last HgbA1c was 6.3% when checked on 01/03/2022.  Patient with an erectile dysfunction/low testosterone diagnosis.  He is on exogenous testosterone.  Patient has an OSAH diagnosis and is compliant with prescribed nocturnal PAP therapy.  With patient's smoking history, OSAH, and exogenous testosterone use, he has a diagnosis of secondary erythrocytosis and receives therapeutic small-volume phlebotomies with hematology. Functional capacity, as defined by DASI, is documented as being >/= 4 METS. No changes were made to his medication regimen.  Patient to follow-up with outpatient cardiology in 6 months or sooner if needed.  Christian Cole is scheduled for an elective BILATERAL ROBOTIC ASSISTED INGUINAL HERNIA REPAIR WITH MESH; UMBILICAL HERNIA REPAIR on 01/11/2022 with  Dr. Olean Ree, MD. Given patient's past medical history significant for cardiovascular diagnoses, presurgical cardiac clearance was sought  by the performing surgeon's office and PAT team. Per cardiology, "based ACC/AHA guidelines, the patient's past medical history, and the amount of time since his last clinic visit, this patient would be at an overall ACCEPTABLE risk for the planned procedure without further cardiovascular testing or intervention at this time".  Again, patient is on daily antiplatelet therapy.  He has been instructed on recommendations from his cardiologist for holding his clopidogrel for 7 days prior to his procedure with plans to restart as soon as postoperative bleeding risk felt to be minimized by his primary attending surgeon.  The patient is aware that his last dose of clopidogrel should be on 01/03/2022.  Patient denies previous perioperative complications with anesthesia in the past. In review of the available records, it is noted that patient underwent a general anesthetic course here (ASA III) in 11/2021 without documented complications.   Vitals with BMI 01/03/2022 01/02/2022 12/25/2021  Height 5\' 11"  5\' 11"  5' 10.5"  Weight 209 lbs 13 oz 208 lbs 208 lbs 3 oz  BMI 29.27 09.32 35.57  Systolic 322 025 427  Diastolic 64 77 75  Pulse 62 61 62    Providers/Specialists:   NOTE: Primary physician provider listed below. Patient may have been seen by APP or partner within same practice.   PROVIDER ROLE / SPECIALTY LAST Delight Stare, MD General Surgery (Surgeon) 12/25/2021  [name], Nino Glow, MD Primary Care Provider 01/03/2022  Shelva Majestic, MD Cardiology 11/20/2021   Allergies:  Tamsulosin  Current Home Medications:   No current facility-administered medications for this encounter.    amLODipine (NORVASC) 10 MG tablet   atorvastatin (LIPITOR) 40 MG tablet   benazepril (LOTENSIN) 40 MG tablet   clopidogrel (PLAVIX) 75 MG tablet   ezetimibe (ZETIA) 10  MG tablet   ibuprofen (ADVIL) 200 MG tablet   Multiple Vitamins-Minerals (MULTIVITAMIN WITH MINERALS) tablet   silodosin (RAPAFLO) 8 MG CAPS capsule   testosterone cypionate (DEPOTESTOSTERONE CYPIONATE) 200 MG/ML injection   NEEDLE, DISP, 18 G (BD SAFETYGLIDE NEEDLE) 18G X 1-1/2" MISC   pantoprazole (PROTONIX) 40 MG tablet   SYRINGE-NEEDLE, DISP, 3 ML 23G X 1-1/2" 3 ML MISC   History:   Past Medical History:  Diagnosis Date   Aortic atherosclerosis (HCC)    BPH (benign prostatic hyperplasia)    CAD (coronary artery disease)    a. s/p CABG in 2000 with LIMA-LAD, RIMA-PDA, SVG-D1, and SVG-PLA b. low-risk NST in 2016   Carotid artery stenosis    Cyst of right kidney    ED (erectile dysfunction)    a.) penile implant in place   Fatty liver    GERD (gastroesophageal reflux disease)    Hyperlipidemia    Hypertension    Long term current use of antithrombotics/antiplatelets    a.) clopidogrel   Low testosterone    a.) on exogenous TRT   OSA on CPAP    Pre-diabetes    S/P CABG x 4 2000   a.) LIMA-LAD, RIMA-PDA, SVG-D1, SVG-PLA   Secondary erythrocytosis    a.) smoking + TRT + OSAH   Sinus bradycardia    Skin cancer    Thrombocytopenia (HCC)    Tongue lesion    benign from trauma as of 06/20/21   Past Surgical History:  Procedure Laterality Date   CATARACT EXTRACTION, BILATERAL  2010   COLONOSCOPY WITH PROPOFOL N/A 04/15/2020   Procedure: COLONOSCOPY WITH PROPOFOL;  Surgeon: Robert Bellow, MD;  Location: [facility] ENDOSCOPY;  Service: Endoscopy;  Laterality: N/A;  CORONARY ARTERY BYPASS GRAFT  2000   LIMA to the LAD,RIMA to the PDA,vein graft to the diagonal & vein graft to the PLA   ESOPHAGOGASTRODUODENOSCOPY     Dr. Gustavo Lah   ESOPHAGOGASTRODUODENOSCOPY  04/15/2020   Procedure: ESOPHAGOGASTRODUODENOSCOPY (EGD);  Surgeon: Robert Bellow, MD;  Location[facility] ENDOSCOPY;  Service: Endoscopy;;   HOLEP-LASER ENUCLEATION OF THE PROSTATE WITH MORCELLATION N/A 11/27/2021    Procedure: HOLEP-LASER ENUCLEATION OF THE PROSTATE WITH MORCELLATION;  Surgeon: Hollice Espy, MD;  Location: [facility] ORS;  Service: Urology;  Laterality: N/A;   KNEE ARTHROSCOPY Right 1991   KNEE DEBRIDEMENT Right 1989   Fluid flush   PENILE PROSTHESIS IMPLANT     06/26/19 Dr. Francesca Jewett   SHOULDER ARTHROSCOPY Left 2011   US ECHOCARDIOGRAPHY  12/12/2010   mild LA dilatation, normal LV systolic fx, EF > 93%   Family History  Problem Relation Age of Onset   Arthritis Mother    Heart disease Father        CAD - MI   Stroke Father    Hypertension Father    Cancer Father 78       esophageal cancer    Arthritis Maternal Grandmother    Social History   Tobacco Use   Smoking status: Every Day    Packs/day: 0.50    Years: 44.00    Pack years: 22.00    Types: Cigarettes   Smokeless tobacco: Never   Tobacco comments:    on and off smoker   Vaping Use   Vaping Use: Never used  Substance Use Topics   Alcohol use: Yes    Alcohol/week: 10.0 standard drinks    Types: 10 Cans of beer per week    Comment: Down to 3 beers a day from 4-6 a day    Drug use: No    Pertinent Clinical Results:  LABS: Labs reviewed: Acceptable for surgery.  Hospital Outpatient Visit on 01/03/2022  Component Date Value Ref Range Status   Creatinine, Ser 01/03/2022 1.30 (H)  0.61 - 1.24 mg/dL Final  Office Visit on 01/03/2022  Component Date Value Ref Range Status   Sodium 01/03/2022 136  135 - 145 mEq/L Final   Potassium 01/03/2022 4.4  3.5 - 5.1 mEq/L Final   Chloride 01/03/2022 103  96 - 112 mEq/L Final   CO2 01/03/2022 26  19 - 32 mEq/L Final   Glucose, Bld 01/03/2022 100 (H)  70 - 99 mg/dL Final   BUN 01/03/2022 18  6 - 23 mg/dL Final   Creatinine, Ser 01/03/2022 1.07  0.40 - 1.50 mg/dL Final   Total Bilirubin 01/03/2022 0.9  0.2 - 1.2 mg/dL Final   Alkaline Phosphatase 01/03/2022 91  39 - 117 U/L Final   AST 01/03/2022 21  0 - 37 U/L Final   ALT 01/03/2022 25  0 - 53 U/L Final   Total Protein  01/03/2022 6.7  6.0 - 8.3 g/dL Final   Albumin 01/03/2022 4.5  3.5 - 5.2 g/dL Final   GFR 01/03/2022 69.62  >60.00 mL/min Final   Calculated using the CKD-EPI Creatinine Equation (2021)   Calcium 01/03/2022 9.5  8.4 - 10.5 mg/dL Final   WBC 01/03/2022 6.6  4.0 - 10.5 K/uL Final   RBC 01/03/2022 5.29  4.22 - 5.81 Mil/uL Final   Hemoglobin 01/03/2022 16.4  13.0 - 17.0 g/dL Final   HCT 01/03/2022 49.5  39.0 - 52.0 % Final   MCV 01/03/2022 93.5  78.0 - 100.0 fl Final  MCHC 01/03/2022 33.2  30.0 - 36.0 g/dL Final   RDW 01/03/2022 16.0 (H)  11.5 - 15.5 % Final   Platelets 01/03/2022 130.0 (L)  150.0 - 400.0 K/uL Final   Neutrophils Relative % 01/03/2022 52.1  43.0 - 77.0 % Final   Lymphocytes Relative 01/03/2022 36.9  12.0 - 46.0 % Final   Monocytes Relative 01/03/2022 9.7  3.0 - 12.0 % Final   Eosinophils Relative 01/03/2022 1.0  0.0 - 5.0 % Final   Basophils Relative 01/03/2022 0.3  0.0 - 3.0 % Final   Neutro Abs 01/03/2022 3.5  1.4 - 7.7 K/uL Final   Lymphs Abs 01/03/2022 2.4  0.7 - 4.0 K/uL Final   Monocytes Absolute 01/03/2022 0.6  0.1 - 1.0 K/uL Final   Eosinophils Absolute 01/03/2022 0.1  0.0 - 0.7 K/uL Final   Basophils Absolute 01/03/2022 0.0  0.0 - 0.1 K/uL Final   TSH 01/03/2022 1.50  0.35 - 5.50 uIU/mL Final   Hgb A1c MFr Bld 01/03/2022 6.3  4.6 - 6.5 % Final   Glycemic Control Guidelines for People with Diabetes:Non Diabetic:  <6%Goal of Therapy: <7%Additional Action Suggested:  >8%    Color, Urine 01/03/2022 YELLOW  YELLOW Final   APPearance 01/03/2022 CLEAR  CLEAR Final   Specific Gravity, Urine 01/03/2022 1.009  1.001 - 1.035 Final   pH 01/03/2022 6.5  5.0 - 8.0 Final   Glucose, UA 01/03/2022 NEGATIVE  NEGATIVE Final   Bilirubin Urine 01/03/2022 NEGATIVE  NEGATIVE Final   Ketones, ur 01/03/2022 NEGATIVE  NEGATIVE Final   Hgb urine dipstick 01/03/2022 TRACE (A)  NEGATIVE Final   Protein, ur 01/03/2022 NEGATIVE  NEGATIVE Final   Nitrite 01/03/2022 NEGATIVE  NEGATIVE Final    Leukocytes,Ua 01/03/2022 3+ (A)  NEGATIVE Final   WBC, UA 01/03/2022 40-60 (A)  0 - 5 /HPF Final   RBC / HPF 01/03/2022 0-2  0 - 2 /HPF Final   Squamous Epithelial / LPF 01/03/2022 NONE SEEN  < OR = 5 /HPF Final   Bacteria, UA 01/03/2022 NONE SEEN  NONE SEEN /HPF Final   Hyaline Cast 01/03/2022 NONE SEEN  NONE SEEN /LPF Final   VITD 01/03/2022 38.09  30.00 - 100.00 ng/mL Final   Path Review 01/03/2022    Final   Comment: Myeloid population consists predominantly of mature segmented neutrophils with reactive changes. No immature cells are identified. Slight Thrombocytopenia. No platelet clumps identified. RBCs demonstrate polychromasia. Reviewed by Francis Gaines Mammarappallil, MD  (Electronic Signature on File)     01/04/2022    aPTT 01/03/2022 30.7  23.4 - 32.7 SEC Final   INR 01/03/2022 1.0  0.8 - 1.0 ratio Final   Prothrombin Time 01/03/2022 11.1  9.6 - 13.1 sec Final   Note 01/03/2022    Final   Comment: This urine was analyzed for the presence of WBC,  RBC, bacteria, casts, and other formed elements.  Only those elements seen were reported. . .     ECG: Date: 11/20/2021 Time ECG obtained: 1114 AM Rate: 56 bpm Rhythm:  Sinus bradycardia with first-degree AV block Axis (leads I and aVF): Normal Intervals: PR 246 ms. QRS 108 ms. QTc 416 ms. ST segment and T wave changes: No evidence of acute ST segment elevation or depression Comparison: Similar to previous tracing obtained on 09/04/2021   IMAGING / PROCEDURES: MYOCARDIAL PERFUSION IMAGING STUDY (LEXISCAN) performed on 10/13/2021 Mildly reduced left ventricular systolic function with an EF of 49% Normal wall motion CT attenuation correction images with moderate  diffuse aortic atherosclerosis and coronary artery calcification No evidence of stress-induced myocardial ischemia or arrhythmia Low risk study  TRANSTHORACIC ECHOCARDIOGRAM performed on 12/12/2010 Normal left ventricular systolic function with an EF of 53% No  significant valvular regurgitation No valvular stenosis Mild left atrial dilatation No pericardial effusion  CORONARY ARTERY BYPASS GRAFTING performed in 2000 Four-vessel CABG procedure LIMA-LAD RIMA-PDA SVG-D1 SVG-PLA  Impression and Plan:  Christian Cole has been referred for pre-anesthesia review and clearance prior to him undergoing the planned anesthetic and procedural courses. Available labs, pertinent testing, and imaging results were personally reviewed by me. This patient has been appropriately cleared by cardiology with an overall ACCEPTABLE risk of significant perioperative cardiovascular complications.  Based on clinical review performed today (01/05/22), barring any significant acute changes in the patient's overall condition, it is anticipated that he will be able to proceed with the planned surgical intervention. Any acute changes in clinical condition may necessitate his procedure being postponed and/or cancelled. Patient will meet with anesthesia team (MD and/or CRNA) on the day of his procedure for preoperative evaluation/assessment. Questions regarding anesthetic course will be fielded at that time.   Pre-surgical instructions were reviewed with the patient during his PAT appointment and questions were fielded by PAT clinical staff. Patient was advised that if any questions or concerns arise prior to his procedure then he should return a call to PAT and/or his surgeon's office to discuss.  Honor Loh, MSN, APRN, FNP-C, CEN [facility]  Peri-operative Services Nurse Practitioner Phone: [phone] Fax: [phone] 01/05/22 11:33 AM  NOTE: This note has been prepared using Dragon dictation software. Despite my best ability to proofread, there is always the potential that unintentional transcriptional errors may still occur from this process.

## 2022-01-08 ENCOUNTER — Telehealth: Payer: Self-pay

## 2022-01-08 NOTE — Telephone Encounter (Signed)
Cardiac clearance received from Dr.Thomas Kelly-acceptable risk without  further cardiovascular testing-may hold plavix 5-7 days prior to procedure and restart as soon as possible afterward at the surgeon's discretion based on bleeding risk. Last dose of Plavix 01/08/22-Surgery scheduled 01/11/2022. Tried reaching patient- left message to return call to office-

## 2022-01-10 MED ORDER — CHLORHEXIDINE GLUCONATE CLOTH 2 % EX PADS
6.0000 | MEDICATED_PAD | Freq: Once | CUTANEOUS | Status: AC
  Administered 2022-01-11: 6 via TOPICAL

## 2022-01-10 MED ORDER — GABAPENTIN 300 MG PO CAPS
300.0000 mg | ORAL_CAPSULE | ORAL | Status: AC
  Administered 2022-01-11: 300 mg via ORAL

## 2022-01-10 MED ORDER — ACETAMINOPHEN 500 MG PO TABS
1000.0000 mg | ORAL_TABLET | ORAL | Status: AC
  Administered 2022-01-11: 1000 mg via ORAL

## 2022-01-10 MED ORDER — CEFAZOLIN SODIUM-DEXTROSE 2-4 GM/100ML-% IV SOLN
2.0000 g | INTRAVENOUS | Status: AC
  Administered 2022-01-11: 2 g via INTRAVENOUS

## 2022-01-10 MED ORDER — LACTATED RINGERS IV SOLN: INTRAVENOUS | Status: DC

## 2022-01-10 MED ORDER — BUPIVACAINE LIPOSOME 1.3 % IJ SUSP: 20.0000 mL | Freq: Once | INTRAMUSCULAR | Status: DC

## 2022-01-10 MED ORDER — ORAL CARE MOUTH RINSE: 15.0000 mL | Freq: Once | OROMUCOSAL | Status: AC

## 2022-01-10 MED ORDER — CHLORHEXIDINE GLUCONATE 0.12 % MT SOLN
15.0000 mL | Freq: Once | OROMUCOSAL | Status: AC
  Administered 2022-01-11: 15 mL via OROMUCOSAL

## 2022-01-11 ENCOUNTER — Encounter
Admission: RE | Disposition: A | Discharge status: Home / Self Care | Payer: Self-pay | Source: Home / Self Care | Attending: Surgery

## 2022-01-11 ENCOUNTER — Other Ambulatory Visit: Payer: Self-pay

## 2022-01-11 ENCOUNTER — Ambulatory Visit: Payer: Medicare Other | Admitting: Urgent Care

## 2022-01-11 ENCOUNTER — Encounter: Payer: Self-pay | Admitting: Surgery

## 2022-01-11 ENCOUNTER — Ambulatory Visit
Admission: RE | Admit: 2022-01-11 | Discharge: 2022-01-11 | Disposition: A | Discharge status: Home / Self Care | Payer: Medicare Other | Attending: Surgery | Admitting: Surgery

## 2022-01-11 DIAGNOSIS — Z85828 Personal history of other malignant neoplasm of skin: Secondary | ICD-10-CM | POA: Insufficient documentation

## 2022-01-11 DIAGNOSIS — N400 Enlarged prostate without lower urinary tract symptoms: Secondary | ICD-10-CM | POA: Insufficient documentation

## 2022-01-11 DIAGNOSIS — I10 Essential (primary) hypertension: Secondary | ICD-10-CM | POA: Insufficient documentation

## 2022-01-11 DIAGNOSIS — K4020 Bilateral inguinal hernia, without obstruction or gangrene, not specified as recurrent: Secondary | ICD-10-CM

## 2022-01-11 DIAGNOSIS — G4733 Obstructive sleep apnea (adult) (pediatric): Secondary | ICD-10-CM | POA: Diagnosis not present

## 2022-01-11 DIAGNOSIS — Z7902 Long term (current) use of antithrombotics/antiplatelets: Secondary | ICD-10-CM | POA: Insufficient documentation

## 2022-01-11 DIAGNOSIS — I2510 Atherosclerotic heart disease of native coronary artery without angina pectoris: Secondary | ICD-10-CM | POA: Insufficient documentation

## 2022-01-11 DIAGNOSIS — E785 Hyperlipidemia, unspecified: Secondary | ICD-10-CM | POA: Diagnosis not present

## 2022-01-11 DIAGNOSIS — K429 Umbilical hernia without obstruction or gangrene: Secondary | ICD-10-CM

## 2022-01-11 DIAGNOSIS — K219 Gastro-esophageal reflux disease without esophagitis: Secondary | ICD-10-CM | POA: Insufficient documentation

## 2022-01-11 DIAGNOSIS — F17200 Nicotine dependence, unspecified, uncomplicated: Secondary | ICD-10-CM | POA: Insufficient documentation

## 2022-01-11 DIAGNOSIS — N529 Male erectile dysfunction, unspecified: Secondary | ICD-10-CM | POA: Insufficient documentation

## 2022-01-11 DIAGNOSIS — K760 Fatty (change of) liver, not elsewhere classified: Secondary | ICD-10-CM | POA: Insufficient documentation

## 2022-01-11 DIAGNOSIS — Z951 Presence of aortocoronary bypass graft: Secondary | ICD-10-CM | POA: Insufficient documentation

## 2022-01-11 DIAGNOSIS — K4090 Unilateral inguinal hernia, without obstruction or gangrene, not specified as recurrent: Secondary | ICD-10-CM | POA: Diagnosis not present

## 2022-01-11 HISTORY — PX: INGUINAL HERNIA REPAIR: SHX194

## 2022-01-11 HISTORY — PX: XI ROBOTIC ASSISTED INGUINAL HERNIA REPAIR WITH MESH: SHX6706

## 2022-01-11 HISTORY — DX: Secondary polycythemia: D75.1

## 2022-01-11 HISTORY — DX: Benign prostatic hyperplasia without lower urinary tract symptoms: N40.0

## 2022-01-11 HISTORY — DX: Long term (current) use of antithrombotics/antiplatelets: Z79.02

## 2022-01-11 HISTORY — DX: Atherosclerosis of aorta: I70.0

## 2022-01-11 HISTORY — PX: UMBILICAL HERNIA REPAIR: SHX196

## 2022-01-11 HISTORY — DX: Unspecified malignant neoplasm of skin, unspecified: C44.90

## 2022-01-11 SURGERY — REPAIR, HERNIA, INGUINAL, ROBOT-ASSISTED, LAPAROSCOPIC, USING MESH
Anesthesia: General | Laterality: Right

## 2022-01-11 MED ORDER — ROCURONIUM BROMIDE 100 MG/10ML IV SOLN
INTRAVENOUS | Status: DC | PRN
  Administered 2022-01-11: 20 mg via INTRAVENOUS
  Administered 2022-01-11: 60 mg via INTRAVENOUS
  Administered 2022-01-11: 40 mg via INTRAVENOUS
  Administered 2022-01-11 (×2): 20 mg via INTRAVENOUS

## 2022-01-11 MED ORDER — FENTANYL CITRATE (PF) 100 MCG/2ML IJ SOLN
INTRAMUSCULAR | Status: AC
  Filled 2022-01-11: qty 2

## 2022-01-11 MED ORDER — KETOROLAC TROMETHAMINE 30 MG/ML IJ SOLN
INTRAMUSCULAR | Status: AC
  Filled 2022-01-11: qty 1

## 2022-01-11 MED ORDER — ONDANSETRON HCL 4 MG/2ML IJ SOLN
INTRAMUSCULAR | Status: AC
  Filled 2022-01-11: qty 2

## 2022-01-11 MED ORDER — OXYCODONE HCL 5 MG PO TABS: 5.0000 mg | ORAL_TABLET | ORAL | 0 refills | Status: DC | PRN

## 2022-01-11 MED ORDER — PHENYLEPHRINE 40 MCG/ML (10ML) SYRINGE FOR IV PUSH (FOR BLOOD PRESSURE SUPPORT)
PREFILLED_SYRINGE | INTRAVENOUS | Status: DC | PRN
Start: 2022-01-11 — End: 2022-01-11
  Administered 2022-01-11: 80 ug via INTRAVENOUS

## 2022-01-11 MED ORDER — CEFAZOLIN SODIUM 1 G IJ SOLR
INTRAMUSCULAR | Status: AC
  Filled 2022-01-11: qty 20

## 2022-01-11 MED ORDER — PHENYLEPHRINE HCL (PRESSORS) 10 MG/ML IV SOLN
INTRAVENOUS | Status: AC
  Filled 2022-01-11: qty 1

## 2022-01-11 MED ORDER — DEXAMETHASONE SODIUM PHOSPHATE 10 MG/ML IJ SOLN
INTRAMUSCULAR | Status: DC | PRN
Start: 2022-01-11 — End: 2022-01-11
  Administered 2022-01-11: 10 mg via INTRAVENOUS

## 2022-01-11 MED ORDER — GABAPENTIN 300 MG PO CAPS
ORAL_CAPSULE | ORAL | Status: AC
  Filled 2022-01-11: qty 1

## 2022-01-11 MED ORDER — MIDAZOLAM HCL 2 MG/2ML IJ SOLN
INTRAMUSCULAR | Status: DC | PRN
Start: 2022-01-11 — End: 2022-01-11
  Administered 2022-01-11: 2 mg via INTRAVENOUS

## 2022-01-11 MED ORDER — MIDAZOLAM HCL 2 MG/2ML IJ SOLN
INTRAMUSCULAR | Status: AC
  Filled 2022-01-11: qty 2

## 2022-01-11 MED ORDER — OXYCODONE HCL 5 MG PO TABS: 5.0000 mg | ORAL_TABLET | Freq: Once | ORAL | Status: DC | PRN

## 2022-01-11 MED ORDER — SODIUM CHLORIDE (PF) 0.9 % IJ SOLN
INTRAMUSCULAR | Status: DC | PRN
Start: 2022-01-11 — End: 2022-01-11
  Administered 2022-01-11: 60 mL

## 2022-01-11 MED ORDER — GLYCOPYRROLATE 0.2 MG/ML IJ SOLN
INTRAMUSCULAR | Status: DC | PRN
  Administered 2022-01-11: .2 mg via INTRAVENOUS

## 2022-01-11 MED ORDER — PHENYLEPHRINE 40 MCG/ML (10ML) SYRINGE FOR IV PUSH (FOR BLOOD PRESSURE SUPPORT)
PREFILLED_SYRINGE | INTRAVENOUS | Status: AC
  Filled 2022-01-11: qty 10

## 2022-01-11 MED ORDER — ACETAMINOPHEN 500 MG PO TABS
ORAL_TABLET | ORAL | Status: AC
  Filled 2022-01-11: qty 2

## 2022-01-11 MED ORDER — BUPIVACAINE HCL (PF) 0.25 % IJ SOLN
INTRAMUSCULAR | Status: AC
  Filled 2022-01-11: qty 30

## 2022-01-11 MED ORDER — GLYCOPYRROLATE 0.2 MG/ML IJ SOLN
INTRAMUSCULAR | Status: AC
  Filled 2022-01-11: qty 1

## 2022-01-11 MED ORDER — BUPIVACAINE-EPINEPHRINE (PF) 0.5% -1:200000 IJ SOLN
INTRAMUSCULAR | Status: AC
  Filled 2022-01-11: qty 30

## 2022-01-11 MED ORDER — OXYCODONE HCL 5 MG/5ML PO SOLN: 5.0000 mg | Freq: Once | ORAL | Status: DC | PRN

## 2022-01-11 MED ORDER — SODIUM CHLORIDE FLUSH 0.9 % IV SOLN
INTRAVENOUS | Status: AC
  Filled 2022-01-11: qty 10

## 2022-01-11 MED ORDER — EPHEDRINE 5 MG/ML INJ
INTRAVENOUS | Status: AC
  Filled 2022-01-11: qty 5

## 2022-01-11 MED ORDER — CHLORHEXIDINE GLUCONATE 0.12 % MT SOLN
OROMUCOSAL | Status: AC
  Filled 2022-01-11: qty 15

## 2022-01-11 MED ORDER — EPHEDRINE SULFATE (PRESSORS) 50 MG/ML IJ SOLN
INTRAMUSCULAR | Status: DC | PRN
Start: 2022-01-11 — End: 2022-01-11
  Administered 2022-01-11 (×3): 5 mg via INTRAVENOUS

## 2022-01-11 MED ORDER — ROCURONIUM BROMIDE 10 MG/ML (PF) SYRINGE
PREFILLED_SYRINGE | INTRAVENOUS | Status: AC
  Filled 2022-01-11: qty 20

## 2022-01-11 MED ORDER — CEFAZOLIN SODIUM-DEXTROSE 2-4 GM/100ML-% IV SOLN
INTRAVENOUS | Status: AC
  Filled 2022-01-11: qty 100

## 2022-01-11 MED ORDER — ONDANSETRON HCL 4 MG/2ML IJ SOLN
INTRAMUSCULAR | Status: DC | PRN
  Administered 2022-01-11: 4 mg via INTRAVENOUS

## 2022-01-11 MED ORDER — DEXAMETHASONE SODIUM PHOSPHATE 10 MG/ML IJ SOLN
INTRAMUSCULAR | Status: AC
  Filled 2022-01-11: qty 1

## 2022-01-11 MED ORDER — LIDOCAINE HCL (PF) 2 % IJ SOLN
INTRAMUSCULAR | Status: AC
  Filled 2022-01-11: qty 5

## 2022-01-11 MED ORDER — SUGAMMADEX SODIUM 200 MG/2ML IV SOLN
INTRAVENOUS | Status: DC | PRN
Start: 2022-01-11 — End: 2022-01-11
  Administered 2022-01-11: 200 mg via INTRAVENOUS

## 2022-01-11 MED ORDER — 0.9 % SODIUM CHLORIDE (POUR BTL) OPTIME
TOPICAL | Status: DC | PRN
Start: 2022-01-11 — End: 2022-01-11
  Administered 2022-01-11: 200 mL

## 2022-01-11 MED ORDER — PROPOFOL 500 MG/50ML IV EMUL
INTRAVENOUS | Status: AC
  Filled 2022-01-11: qty 50

## 2022-01-11 MED ORDER — FENTANYL CITRATE (PF) 100 MCG/2ML IJ SOLN
INTRAMUSCULAR | Status: DC | PRN
  Administered 2022-01-11 (×4): 50 ug via INTRAVENOUS

## 2022-01-11 MED ORDER — KETOROLAC TROMETHAMINE 30 MG/ML IJ SOLN
INTRAMUSCULAR | Status: DC | PRN
  Administered 2022-01-11: 15 mg via INTRAVENOUS

## 2022-01-11 MED ORDER — PROPOFOL 10 MG/ML IV BOLUS
INTRAVENOUS | Status: DC | PRN
  Administered 2022-01-11: 120 mg via INTRAVENOUS

## 2022-01-11 MED ORDER — ACETAMINOPHEN 500 MG PO TABS: 1000.0000 mg | ORAL_TABLET | Freq: Four times a day (QID) | ORAL | Status: DC | PRN

## 2022-01-11 MED ORDER — BUPIVACAINE LIPOSOME 1.3 % IJ SUSP
INTRAMUSCULAR | Status: AC
  Filled 2022-01-11: qty 20

## 2022-01-11 MED ORDER — LIDOCAINE HCL (CARDIAC) PF 100 MG/5ML IV SOSY
PREFILLED_SYRINGE | INTRAVENOUS | Status: DC | PRN
Start: 2022-01-11 — End: 2022-01-11
  Administered 2022-01-11: 100 mg via INTRAVENOUS

## 2022-01-11 MED ORDER — FENTANYL CITRATE (PF) 100 MCG/2ML IJ SOLN: 25.0000 ug | INTRAMUSCULAR | Status: DC | PRN

## 2022-01-11 SURGICAL SUPPLY — 84 items
ADH SKN CLS APL DERMABOND .7 (GAUZE/BANDAGES/DRESSINGS) ×3
ANCHOR TIS RET SYS 235ML (MISCELLANEOUS) ×1 IMPLANT
BAG TISS RTRVL C235 10X14 (MISCELLANEOUS) ×3
BLADE SURG 15 STRL LF DISP TIS (BLADE) ×3 IMPLANT
BLADE SURG 15 STRL SS (BLADE) ×4
CANNULA REDUC XI 12-8 STAPL (CANNULA) ×4
CANNULA REDUCER 12-8 DVNC XI (CANNULA) ×3 IMPLANT
COVER TIP SHEARS 8 DVNC (MISCELLANEOUS) ×3 IMPLANT
COVER TIP SHEARS 8MM DA VINCI (MISCELLANEOUS) ×4
COVER WAND RF STERILE (DRAPES) ×4 IMPLANT
DEFOGGER SCOPE WARMER CLEARIFY (MISCELLANEOUS) ×4 IMPLANT
DERMABOND ADVANCED (GAUZE/BANDAGES/DRESSINGS) ×1
DERMABOND ADVANCED .7 DNX12 (GAUZE/BANDAGES/DRESSINGS) ×3 IMPLANT
DRAIN PENROSE 12X.25 LTX STRL (MISCELLANEOUS) ×4 IMPLANT
DRAPE ARM DVNC X/XI (DISPOSABLE) ×9 IMPLANT
DRAPE COLUMN DVNC XI (DISPOSABLE) ×3 IMPLANT
DRAPE DA VINCI XI ARM (DISPOSABLE) ×12
DRAPE DA VINCI XI COLUMN (DISPOSABLE) ×4
DRAPE LAPAROTOMY 100X77 ABD (DRAPES) ×4 IMPLANT
DRAPE LAPAROTOMY 77X122 PED (DRAPES) ×4 IMPLANT
ELECT CAUTERY BLADE TIP 2.5 (TIP) ×4
ELECT REM PT RETURN 9FT ADLT (ELECTROSURGICAL) ×4
ELECTRODE CAUTERY BLDE TIP 2.5 (TIP) ×3 IMPLANT
ELECTRODE REM PT RTRN 9FT ADLT (ELECTROSURGICAL) ×3 IMPLANT
GAUZE 4X4 16PLY ~~LOC~~+RFID DBL (SPONGE) ×4 IMPLANT
GLOVE SURG SYN 7.0 (GLOVE) ×8 IMPLANT
GLOVE SURG SYN 7.0 PF PI (GLOVE) ×6 IMPLANT
GLOVE SURG SYN 7.5  E (GLOVE) ×8
GLOVE SURG SYN 7.5 E (GLOVE) ×6 IMPLANT
GLOVE SURG SYN 7.5 PF PI (GLOVE) ×6 IMPLANT
GOWN STRL REUS W/ TWL LRG LVL3 (GOWN DISPOSABLE) ×12 IMPLANT
GOWN STRL REUS W/TWL LRG LVL3 (GOWN DISPOSABLE) ×12
IRRIGATION STRYKERFLOW (MISCELLANEOUS) ×3 IMPLANT
IRRIGATOR STRYKERFLOW (MISCELLANEOUS) ×4
IV NS 1000ML (IV SOLUTION)
IV NS 1000ML BAXH (IV SOLUTION) IMPLANT
KIT PINK PAD W/HEAD ARE REST (MISCELLANEOUS) ×4
KIT PINK PAD W/HEAD ARM REST (MISCELLANEOUS) ×3 IMPLANT
LABEL OR SOLS (LABEL) ×4 IMPLANT
MANIFOLD NEPTUNE II (INSTRUMENTS) ×4 IMPLANT
MESH 3DMAX 4X6 RT LRG (Mesh General) ×1 IMPLANT
MESH 3DMAX MID 4X6 RT LRG (Mesh General) IMPLANT
MESH HERNIA 1.6X1.9 PLUG LRG (Mesh General) ×3 IMPLANT
MESH HERNIA PLUG LRG (Mesh General) ×1 IMPLANT
NDL INSUFFLATION 14GA 120MM (NEEDLE) ×3 IMPLANT
NEEDLE HYPO 22GX1.5 SAFETY (NEEDLE) ×4 IMPLANT
NEEDLE INSUFFLATION 14GA 120MM (NEEDLE) ×4 IMPLANT
NS IRRIG 500ML POUR BTL (IV SOLUTION) ×4 IMPLANT
OBTURATOR OPTICAL STANDARD 8MM (TROCAR) ×4
OBTURATOR OPTICAL STND 8 DVNC (TROCAR) ×3
OBTURATOR OPTICALSTD 8 DVNC (TROCAR) ×3 IMPLANT
PACK BASIN MINOR ARMC (MISCELLANEOUS) ×4 IMPLANT
PACK LAP CHOLECYSTECTOMY (MISCELLANEOUS) ×4 IMPLANT
PENCIL ELECTRO HAND CTR (MISCELLANEOUS) ×4 IMPLANT
SEAL CANN UNIV 5-8 DVNC XI (MISCELLANEOUS) ×9 IMPLANT
SEAL XI 5MM-8MM UNIVERSAL (MISCELLANEOUS) ×12
SET TUBE SMOKE EVAC HIGH FLOW (TUBING) ×4 IMPLANT
SOLUTION ELECTROLUBE (MISCELLANEOUS) ×4 IMPLANT
SPONGE T-LAP 18X18 ~~LOC~~+RFID (SPONGE) ×4 IMPLANT
STAPLER CANNULA SEAL DVNC XI (STAPLE) ×3 IMPLANT
STAPLER CANNULA SEAL XI (STAPLE) ×4
SUT ETHIBOND 0 MO6 C/R (SUTURE) ×4 IMPLANT
SUT MNCRL 4-0 (SUTURE) ×4
SUT MNCRL 4-0 27XMFL (SUTURE) ×3
SUT MNCRL AB 4-0 PS2 18 (SUTURE) ×4 IMPLANT
SUT PROLENE 2 0 SH DA (SUTURE) ×11 IMPLANT
SUT SILK 2 0 (SUTURE) ×4
SUT SILK 2-0 18XBRD TIE 12 (SUTURE) IMPLANT
SUT VIC AB 2-0 CT1 (SUTURE) ×4 IMPLANT
SUT VIC AB 2-0 SH 27 (SUTURE) ×16
SUT VIC AB 2-0 SH 27XBRD (SUTURE) ×6 IMPLANT
SUT VIC AB 3-0 SH 27 (SUTURE) ×4
SUT VIC AB 3-0 SH 27X BRD (SUTURE) ×3 IMPLANT
SUT VICRYL 0 AB UR-6 (SUTURE) ×8 IMPLANT
SUT VLOC 90 S/L VL9 GS22 (SUTURE) ×4 IMPLANT
SUTURE MNCRL 4-0 27XMF (SUTURE) ×3 IMPLANT
SYR 10ML LL (SYRINGE) ×4 IMPLANT
SYR 20ML LL LF (SYRINGE) ×4 IMPLANT
SYR 30ML LL (SYRINGE) ×4 IMPLANT
SYR BULB IRRIG 60ML STRL (SYRINGE) ×4 IMPLANT
TAPE TRANSPORE STRL 2 31045 (GAUZE/BANDAGES/DRESSINGS) ×4 IMPLANT
TRAY FOLEY SLVR 16FR LF STAT (SET/KITS/TRAYS/PACK) ×4 IMPLANT
TROCAR BALLN GELPORT 12X130M (ENDOMECHANICALS) ×4 IMPLANT
WATER STERILE IRR 500ML POUR (IV SOLUTION) ×4 IMPLANT

## 2022-01-11 NOTE — Transfer of Care (Signed)
Immediate Anesthesia Transfer of Care Note ? ?Patient: Christian Cole ? ?Procedure(s) Performed: XI ROBOTIC ASSISTED INGUINAL HERNIA REPAIR WITH MESH (Right) ?HERNIA REPAIR UMBILICAL ADULT ?HERNIA REPAIR INGUINAL ADULT (Left) ? ?Patient Location: PACU ? ?Anesthesia Type:General ? ?Level of Consciousness: sedated ? ?Airway & Oxygen Therapy: Patient Spontanous Breathing and Patient connected to face mask oxygen ? ?Post-op Assessment: Report given to RN and Post -op Vital signs reviewed and stable ? ?Post vital signs: Reviewed and stable ? ?Last Vitals:  ?Vitals Value Taken Time  ?BP 130/70 01/11/22 1140  ?Temp    ?Pulse 71 01/11/22 1143  ?Resp 15 01/11/22 1143  ?SpO2 92 % 01/11/22 1143  ?Vitals shown include unvalidated device data. ? ?Last Pain:  ?Vitals:  ? 01/11/22 0707  ?TempSrc:   ?PainSc: 0-No pain  ?   ? ?  ? ?Complications: No notable events documented. ?

## 2022-01-11 NOTE — Discharge Instructions (Addendum)
AMBULATORY SURGERY  ?DISCHARGE INSTRUCTIONS ? ? ?The drugs that you were given will stay in your system until tomorrow so for the next 24 hours you should not: ? ?Drive an automobile ?Make any legal decisions ?Drink any alcoholic beverage ? ? ?You may resume regular meals tomorrow.  Today it is better to start with liquids and gradually work up to solid foods. ? ?You may eat anything you prefer, but it is better to start with liquids, then soup and crackers, and gradually work up to solid foods. ? ? ?Please notify your doctor immediately if you have any unusual bleeding, trouble breathing, redness and pain at the surgery site, drainage, fever, or pain not relieved by medication. ? ? ? ?Additional Instructions: ? ? ? ?Please contact your physician with any problems or Same Day Surgery at [phone], Monday through Friday 6 am to 4 pm, or [facility] Main number at [phone].  ?

## 2022-01-11 NOTE — Brief Op Note (Signed)
01/11/2022 ? ?11:50 AM ? ?PATIENT:  Christian Cole  71 y.o. male ? ?PRE-OPERATIVE DIAGNOSIS:  Bilateral inguinal hernias, umbilical hernia ? ?POST-OPERATIVE DIAGNOSIS:  Bilateral inguinal hernias, umbilical hernia ? ?PROCEDURE:  Procedure(s): ?XI ROBOTIC ASSISTED INGUINAL HERNIA REPAIR WITH MESH (Right) ?HERNIA REPAIR UMBILICAL ADULT (N/A) ?HERNIA REPAIR INGUINAL ADULT (Left) ? ?SURGEON:  Surgeon(s) and Role: ?   Olean Ree, MD - Primary ? ?ANESTHESIA:   general ? ?EBL:  5 mL  ? ?BLOOD ADMINISTERED:none ? ?DRAINS: none  ? ?LOCAL MEDICATIONS USED:  BUPIVICAINE  ? ?SPECIMEN:  No Specimen ? ?DISPOSITION OF SPECIMEN:  N/A ? ?COUNTS:  YES ? ?DICTATION: .Dragon Dictation ? ?PLAN OF CARE: Discharge to home after PACU ? ?PATIENT DISPOSITION:  PACU - hemodynamically stable. ?  ?Delay start of Pharmacological VTE agent (>24hrs) due to surgical blood loss or risk of bleeding: yes ? ?

## 2022-01-11 NOTE — Anesthesia Postprocedure Evaluation (Signed)
Anesthesia Post Note ? ?Patient: Christian Cole ? ?Procedure(s) Performed: XI ROBOTIC ASSISTED INGUINAL HERNIA REPAIR WITH MESH (Right) ?HERNIA REPAIR UMBILICAL ADULT ?HERNIA REPAIR INGUINAL ADULT (Left) ? ?Patient location during evaluation: PACU ?Anesthesia Type: General ?Level of consciousness: awake and alert ?Pain management: pain level controlled ?Vital Signs Assessment: post-procedure vital signs reviewed and stable ?Respiratory status: spontaneous breathing, nonlabored ventilation, respiratory function stable and patient connected to nasal cannula oxygen ?Cardiovascular status: blood pressure returned to baseline and stable ?Postop Assessment: no apparent nausea or vomiting ?Anesthetic complications: no ? ? ?No notable events documented. ? ? ?Last Vitals:  ?Vitals:  ? 01/11/22 1255 01/11/22 1310  ?BP: (!) 109/57 (!) 114/57  ?Pulse: 62 (!) 59  ?Resp: 12 14  ?Temp: 36.7 ?C (!) 36.4 ?C  ?SpO2: 98% 94%  ?  ?Last Pain:  ?Vitals:  ? 01/11/22 1310  ?TempSrc: Temporal  ?PainSc: 0-No pain  ? ? ?  ?  ?  ?  ?  ?  ? ?[name] ? ? ? ? ?

## 2022-01-11 NOTE — Op Note (Addendum)
?Procedure Date:  01/11/2022 ? ?Pre-operative Diagnosis:  Bilateral inguinal hernias, umbilical hernia ? ?Post-operative Diagnosis: Reducible bilateral inguinal hernias and reducible umbilical hernia < 3 cm. ? ?Procedure: ?Open left inguinal hernia repair ?Robotic assisted right inguinal hernia repair ?Creation of Right Posterior Rectus-Transversalis Fascia Advancement Flap for Coverage of Pelvic Wound (200 cm?) ?Open umbilical hernia repair < 3 cm. ? ?Surgeon:  Melvyn Neth, MD ? ?Anesthesia:  General endotracheal ? ?Estimated Blood Loss:  10 ml ? ?Specimens:   ?Left inguinal hernia sac ?Left cord lipoma ? ?Complications:  None ? ?Indications for Procedure:  This is a 71 y.o. male who presents with bilateral inguinal hernias and an umbilical hernia, all reducible.  He has a prior penile prosthetic implant, and CT scan of the pelvis revealed the reservoir to be in the left side of the space of retzius.  Thus, rather than approaching his left inguinal hernia robotically as well, it was decided to repair it in open approach.  Dr. Erlene Quan of Urology and the AMS rep were both available as a precaution in case of any implant injury.  The risks of bleeding, abscess or infection, recurrence of symptoms, potential for an open procedure, injury to surrounding structures, and chronic pain were all discussed with the patient and he was willing to proceed.  We have planned this transabdominal procedure with the creation of a right peritoneal flap based on the posterior rectus sheath and transversalis fascia in order to fully cover the mesh, creating a natural tisssue barrier for the bowel and peritoneal cavity. ? ?Description of Procedure: ?The patient was correctly identified in the preoperative area and brought into the operating room.  The patient was placed supine with VTE prophylaxis in place.  Appropriate time-outs were performed.  Anesthesia was induced and the patient was intubated.  Foley catheter was placed and  the patient's penile prosthetic was pumped in order to empty the reservoir.  Appropriate antibiotics were infused. ? ?The abdomen was prepped and draped in a sterile fashion. We started with the left inguinal hernia.  An oblique incision was made between the pubic symphysis extending laterally toward the ASIS. Using electrocautery, the subcutaneous tissues were dissected, assuring adequate hemostasis, until reaching the external oblique aponeurosis.  A 1 cm incision was made over the aponeurosis and extended laterally and medially toward the external inguinal ring avoiding injury to the ilioinguinal nerve.  None of the prosthetic structures or components were visible from within the left inguinal hernia field.  However, I could palpate the tubing connecting the reservoir to the pump and it was more medial than our working field and out of the way.  The cord was encircled using a Penrose drain and using medial and lateral retraction, the cord structures were identified and the hernia sac was identified and dissected free.  The sac was entered and freed of any contents carefully and then ligated with 2-0 Silk tie and resected.  The stump was pushed back into the abdominal cavity.  Similarly, a cord lipoma was also identified and dissected.  It was ligated using 2-0 Silk tie and resected. ? ?A Bard mesh was then placed and sutured to the pubic tubercle, shelving edge, and conjoined tendon with running and interrupted 2-0 Prolene sutures.  The tails of the mesh were crossed behind the cord and sutured together creating a new internal ring.  The external oblique was then closed in running fashion with 2-0 Vicryl, creating a new external ring.  The wound was irrigated, and  local anesthetic was injected onto the fascia and subcutaneous tissue and as a left ilioinguinal block.  The incision was closed in three layers with interrupted 3-0 Vicryl and running 4-0 Monocryl.   ? ?We then proceeded with the right inguinal hernia  and umbilical hernia.  A supraumbilical incision was made and cautery was used to dissect down the subcutaneous tissue along the umbilical stalk.  Kelly forceps were used to dissect the umbilical stalk and it was divided at the fascia using cautery.  This allowed visualization of the hernia defect.  The hernia was reduced without complications and found to measure about 1 cm in size.  The fascial edges were cleaned using cautery and a 12 mm robotic port was introduced.  Pneumoperitoneum was obtained with appropriate opening pressures.  A Veress needle was used to start dissecting the peritoneal flap on the right.  Two 8-mm robotic ports were placed in the right and left lateral positions under direct visualization.  A large right 3D Max Mid Bard Mesh, a 2-0 Vicryl, and 2-0 vloc suture were inserted under direct visualization.  The [employer] platform was docked onto the patient, the camera was inserted and targeted, and the instruments were placed under direct visualization. ? ?Both inguinal regions were inspected.  The ligated portion of the hernia sac was visible in the left groin with no other hernias, and a direct inguinal hernia defect was visible in the right groin.  Using electocautery, the peritoneal and posterior rectus tissue flap was created.  The peritoneum on the right side was scored from the median umbilical ligament laterally towards the ASIS.  The flap was mobilized using robotic scissors and the bipolar instruments, creating a plane along the posterior rectus sheath and transversalis fascia down to the pubic tubercle medially. It was then further mobilized laterally across the inguinal canal and femoral vessels and onto the psoas muscle. The inferior epigastric vessels were identified and preserved. This created a posterior rectus and peritoneal flap measuring roughly 17 cm x 12 cm.  The right edge of the reservoir was visible and kept intact the entire time.  The hernia sac and fatty contents were  reduced preserving all structures and the fat was resected.  A right large Bard 3D Max Mid mesh was placed with good overlap along all the potential hernia defects and secured in place with 2-0 Vicryl along the medial superomedial and superolateral aspects.  Then, the peritoneal flap was advanced over the mesh and carried over to close the defect. A running 2-0 V lock suture was used to approximate the edge of the flap onto the peritoneum.  A suction cannula was inserted at the medial corner of the flap prior to full closure to suction out the air within the flap space.  The mesh appeared to lay flat without any folds.  A small hole in the flap was repaired using 2-0 Vicryl.  The resected fatty tissue was placed in an EndoCatch bag. ? ?All needles were removed under direct visualization.  The 8- mm ports were removed under direct visualization and the Hasson trocar was removed.  The endocatch bag was removed.  The hernia defect was closed using 0 vicryl suture.  Local anesthetic was infused in all incisions as well as a right ilioinguinal block.  The umbilical stalk was reattached to the fascia using 2-0 Vicryl, and the umbilical incision was closed using 3-0 Vicryl and 4-0 Monocryl.  The remaining port incisions were closed with 4-0 Monocryl.  All the  wounds were cleaned and sealed with DermaBond. ? ?The prosthetic was drained back to the reservoir.  Foley catheter was removed and the patient was emerged from anesthesia and extubated and brought to the recovery room for further management. ? ?The patient tolerated the procedure well and all counts were correct at the end of the case. ? ? ?Melvyn Neth, MD  ? ?

## 2022-01-11 NOTE — Anesthesia Procedure Notes (Signed)
Procedure Name: Intubation ?Date/Time: 01/11/2022 7:42 AM ?Performed by: Aline Brochure, CRNA ?Pre-anesthesia Checklist: Patient identified, Patient being monitored, Timeout performed, Emergency Drugs available and Suction available ?Patient Re-evaluated:Patient Re-evaluated prior to induction ?Oxygen Delivery Method: Circle system utilized ?Preoxygenation: Pre-oxygenation with 100% oxygen ?Induction Type: IV induction ?Ventilation: Mask ventilation without difficulty ?Laryngoscope Size: McGraph and 4 ?Grade View: Grade I ?Tube type: Oral ?Tube size: 7.5 mm ?Number of attempts: 1 ?Airway Equipment and Method: Stylet and Video-laryngoscopy ?Placement Confirmation: ETT inserted through vocal cords under direct vision, positive ETCO2 and breath sounds checked- equal and bilateral ?Secured at: 23 cm ?Tube secured with: Tape ?Dental Injury: Teeth and Oropharynx as per pre-operative assessment  ? ? ? ? ?

## 2022-01-11 NOTE — Anesthesia Preprocedure Evaluation (Signed)
Anesthesia Evaluation  ?Patient identified by MRN, date of birth, ID band ?Patient awake ? ? ? ?Reviewed: ?Allergy & Precautions, NPO status , Patient's Chart, lab work & pertinent test results ? ?History of Anesthesia Complications ?Negative for: history of anesthetic complications ? ?Airway ?Mallampati: III ? ?TM Distance: <3 FB ?Neck ROM: full ? ? ? Dental ? ?(+) Missing ?  ?Pulmonary ?neg shortness of breath, sleep apnea , Current Smoker and Patient abstained from smoking.,  ?  ?Pulmonary exam normal ? ? ? ? ? ? ? Cardiovascular ?Exercise Tolerance: Good ?hypertension, + CAD and + CABG  ?Normal cardiovascular exam ? ? ?  ?Neuro/Psych ?negative neurological ROS ? negative psych ROS  ? GI/Hepatic ?Neg liver ROS, GERD  Controlled,  ?Endo/Other  ?negative endocrine ROS ? Renal/GU ?Renal disease  ? ?  ?Musculoskeletal ? ? Abdominal ?  ?Peds ? Hematology ?negative hematology ROS ?(+)   ?Anesthesia Other Findings ?Past Medical History: ?No date: Aortic atherosclerosis ([place]) ?No date: BPH (benign prostatic hyperplasia) ?No date: CAD (coronary artery disease) ?    Comment:  a. s/p CABG in 2000 with LIMA-LAD, RIMA-PDA, SVG-D1, and ?             SVG-PLA b. low-risk NST in 2016 ?No date: Carotid artery stenosis ?No date: Cyst of right kidney ?No date: ED (erectile dysfunction) ?    Comment:  a.) penile implant in place ?No date: Fatty liver ?No date: GERD (gastroesophageal reflux disease) ?No date: Hyperlipidemia ?No date: Hypertension ?No date: Long term current use of antithrombotics/antiplatelets ?    Comment:  a.) clopidogrel ?No date: Low testosterone ?    Comment:  a.) on exogenous TRT ?No date: OSA on CPAP ?No date: Pre-diabetes ?2000: S/P CABG x 4 ?    Comment:  a.) LIMA-LAD, RIMA-PDA, SVG-D1, SVG-PLA ?No date: Secondary erythrocytosis ?    Comment:  a.) smoking + TRT + OSAH ?No date: Sinus bradycardia ?No date: Skin cancer ?No date: Thrombocytopenia ([place]) ?No date: Tongue lesion ?     Comment:  benign from trauma as of 06/20/21 ? ?Past Surgical History: ?2010: CATARACT EXTRACTION, BILATERAL ?04/15/2020: COLONOSCOPY WITH PROPOFOL; N/A ?    Comment:  Procedure: COLONOSCOPY WITH PROPOFOL;  Surgeon: Bary Castilla, ?             Forest Gleason, MD;  Location: [facility] ENDOSCOPY;  Service:  ?             Endoscopy;  Laterality: N/A; ?2000: CORONARY ARTERY BYPASS GRAFT ?    Comment:  LIMA to the LAD,RIMA to the PDA,vein graft to the  ?             diagonal & vein graft to the PLA ?No date: ESOPHAGOGASTRODUODENOSCOPY ?    Comment:  Dr. Gustavo Lah ?04/15/2020: ESOPHAGOGASTRODUODENOSCOPY ?    Comment:  Procedure: ESOPHAGOGASTRODUODENOSCOPY (EGD);  Surgeon:  ?             Robert Bellow, MD;  Location: [facility] ENDOSCOPY;   ?             Service: Endoscopy;; ?11/27/2021: HOLEP-LASER ENUCLEATION OF THE PROSTATE WITH MORCELLATION;  ?N/A ?    Comment:  Procedure: HOLEP-LASER ENUCLEATION OF THE PROSTATE WITH  ?             MORCELLATION;  Surgeon: Hollice Espy, MD;  Location:  ?             [facility] ORS;  Service: Urology;  Laterality: N/A; ?1991: KNEE ARTHROSCOPY; Right ?1989: KNEE  DEBRIDEMENT; Right ?    Comment:  Fluid flush ?No date: PENILE PROSTHESIS IMPLANT ?    Comment:  06/26/19 Dr. Francesca Jewett ?2011: SHOULDER ARTHROSCOPY; Left ?12/12/2010: US ECHOCARDIOGRAPHY ?    Comment:  mild LA dilatation, normal LV systolic fx, EF > 50% ? ?BMI   ? Body Mass Index: 29.01 kg/m?  ?  ? ? Reproductive/Obstetrics ?negative OB ROS ? ?  ? ? ? ? ? ? ? ? ? ? ? ? ? ?  ?  ? ? ? ? ? ? ? ? ?Anesthesia Physical ?Anesthesia Plan ? ?ASA: 3 ? ?Anesthesia Plan: General ETT  ? ?Post-op Pain Management:   ? ?Induction: Intravenous ? ?PONV Risk Score and Plan: Ondansetron, Dexamethasone, Midazolam and Treatment may vary due to age or medical condition ? ?Airway Management Planned: Oral ETT ? ?Additional Equipment:  ? ?Intra-op Plan:  ? ?Post-operative Plan: Extubation in OR ? ?Informed Consent: I have reviewed the patients History and Physical, chart, labs and  discussed the procedure including the risks, benefits and alternatives for the proposed anesthesia with the patient or authorized representative who has indicated his/her understanding and acceptance.  ? ? ? ?Dental Advisory Given ? ?Plan Discussed with: Anesthesiologist, CRNA and Surgeon ? ?Anesthesia Plan Comments: (Patient consented for risks of anesthesia including but not limited to:  ?- adverse reactions to medications ?- damage to eyes, teeth, lips or other oral mucosa ?- nerve damage due to positioning  ?- sore throat or hoarseness ?- Damage to heart, brain, nerves, lungs, other parts of body or loss of life ? ?Patient voiced understanding.)  ? ? ? ? ? ? ?Anesthesia Quick Evaluation ? ?

## 2022-01-11 NOTE — Interval H&P Note (Signed)
History and Physical Interval Note: ? ?01/11/2022 ?7:11 AM ? ?Christian Cole  has presented today for surgery, with the diagnosis of Bilateral inguinal hernias, umbilical hernia.  The various methods of treatment have been discussed with the patient and family. After consideration of risks, benefits and other options for treatment, the patient has consented to  Procedure(s): ?XI ROBOTIC [place] (Right) ?HERNIA REPAIR UMBILICAL ADULT (N/A) ?HERNIA REPAIR INGUINAL ADULT (Left) as a surgical intervention.  The patient's history has been reviewed, patient examined, no change in status, stable for surgery.  I have reviewed the patient's chart and labs.  Questions were answered to the patient's satisfaction.   ? ? ?Christian Cole ? ? ?

## 2022-01-12 ENCOUNTER — Encounter: Payer: Medicare Other | Admitting: Surgery

## 2022-01-12 ENCOUNTER — Encounter: Payer: Self-pay | Admitting: Surgery

## 2022-01-12 ENCOUNTER — Ambulatory Visit (INDEPENDENT_AMBULATORY_CARE_PROVIDER_SITE_OTHER): Payer: Medicare Other | Admitting: Urology

## 2022-01-12 DIAGNOSIS — N138 Other obstructive and reflux uropathy: Secondary | ICD-10-CM

## 2022-01-12 DIAGNOSIS — R39198 Other difficulties with micturition: Secondary | ICD-10-CM

## 2022-01-12 DIAGNOSIS — E349 Endocrine disorder, unspecified: Secondary | ICD-10-CM

## 2022-01-12 DIAGNOSIS — N281 Cyst of kidney, acquired: Secondary | ICD-10-CM

## 2022-01-12 DIAGNOSIS — I2510 Atherosclerotic heart disease of native coronary artery without angina pectoris: Secondary | ICD-10-CM

## 2022-01-12 LAB — SURGICAL PATHOLOGY

## 2022-01-12 LAB — BLADDER SCAN AMB NON-IMAGING: Scan Result: 78

## 2022-01-12 NOTE — Progress Notes (Unsigned)
01/12/2022 3:40 PM   [place] 08/14/1951 [id number]  Referring provider: [name], Nino Glow, MD [place],  [place] [address]  Chief Complaint  Patient presents with   Urinary Retention   Urological history: 1. BPH with LU TS -s/p HoLEP 11/27/2021 with Dr. Erlene Quan - prostate chips negative -PVR 78 mL   2. ED -contributing factors of age, testosterone deficiency, BPH, smoking, HTN, CAD, OSA, HLD and prediabetes -managed with an IPP   3. Testosterone deficiency -contributing factors of age and prediabetes -pre-treatment testosterone 242 on 06/06/2020 and 247 on 04/28/2020 -managed with testosterone cypionate 200 mg/mL  HPI: Christian Cole is a 71 y.o. male who presents today with difficulty voiding after an left inguinal hernia repair.    He feels he has not been able to void with an adequate stream.  Patient denies any modifying or aggravating factors.  Patient denies any gross hematuria, dysuria or suprapubic/flank pain.  Patient denies any fevers, chills, nausea or vomiting.    PVR  78 mL   PMH: Past Medical History:  Diagnosis Date   Aortic atherosclerosis (HCC)    BPH (benign prostatic hyperplasia)    CAD (coronary artery disease)    a. s/p CABG in 2000 with LIMA-LAD, RIMA-PDA, SVG-D1, and SVG-PLA b. low-risk NST in 2016   Carotid artery stenosis    Cyst of right kidney    ED (erectile dysfunction)    a.) penile implant in place   Fatty liver    GERD (gastroesophageal reflux disease)    Hyperlipidemia    Hypertension    Long term current use of antithrombotics/antiplatelets    a.) clopidogrel   Low testosterone    a.) on exogenous TRT   OSA on CPAP    Pre-diabetes    S/P CABG x 4 2000   a.) LIMA-LAD, RIMA-PDA, SVG-D1, SVG-PLA   Secondary erythrocytosis    a.) smoking + TRT + OSAH   Sinus bradycardia    Skin cancer    Thrombocytopenia (HCC)    Tongue lesion    benign from trauma as of 06/20/21    Surgical  History: Past Surgical History:  Procedure Laterality Date   CATARACT EXTRACTION, BILATERAL  2010   COLONOSCOPY WITH PROPOFOL N/A 04/15/2020   Procedure: COLONOSCOPY WITH PROPOFOL;  Surgeon: Robert Bellow, MD;  Location: [facility] ENDOSCOPY;  Service: Endoscopy;  Laterality: N/A;   CORONARY ARTERY BYPASS GRAFT  2000   LIMA to the LAD,RIMA to the PDA,vein graft to the diagonal & vein graft to the PLA   ESOPHAGOGASTRODUODENOSCOPY     Dr. Gustavo Lah   ESOPHAGOGASTRODUODENOSCOPY  04/15/2020   Procedure: ESOPHAGOGASTRODUODENOSCOPY (EGD);  Surgeon: Robert Bellow, MD;  Location: [facility] ENDOSCOPY;  Service: Endoscopy;;   HOLEP-LASER ENUCLEATION OF THE PROSTATE WITH MORCELLATION N/A 11/27/2021   Procedure: HOLEP-LASER ENUCLEATION OF THE PROSTATE WITH MORCELLATION;  Surgeon: Hollice Espy, MD;  Location: [facility] ORS;  Service: Urology;  Laterality: N/A;   INGUINAL HERNIA REPAIR Left 01/11/2022   Procedure: HERNIA REPAIR INGUINAL ADULT;  Surgeon: Olean Ree, MD;  Location: [facility] ORS;  Service: General;  Laterality: Left;   KNEE ARTHROSCOPY Right 1991   KNEE DEBRIDEMENT Right 1989   Fluid flush   PENILE PROSTHESIS IMPLANT     06/26/19 Dr. Francesca Jewett   SHOULDER ARTHROSCOPY Left 2505   [place] N/A 01/11/2022   Procedure: HERNIA REPAIR UMBILICAL ADULT;  Surgeon: Olean Ree, MD;  Location: [facility] ORS;  Service: General;  Laterality: N/A;   US  ECHOCARDIOGRAPHY  12/12/2010   mild LA dilatation, normal LV systolic fx, EF > 41%   XI ROBOTIC ASSISTED INGUINAL HERNIA REPAIR WITH MESH Right 01/11/2022   Procedure: XI ROBOTIC ASSISTED INGUINAL HERNIA REPAIR WITH MESH;  Surgeon: Olean Ree, MD;  Location: [facility] ORS;  Service: General;  Laterality: Right;    Home Medications:  Allergies as of 01/12/2022       Reactions   Tamsulosin Other (See Comments)   Chest discomfort, basal dilation with a warm feeling in chest         Medication List        Accurate as of January 12, 2022 11:59 PM. If you  have any questions, ask your nurse or doctor.          acetaminophen 500 MG tablet Commonly known as: TYLENOL Take 2 tablets (1,000 mg total) by mouth every 6 (six) hours as needed for mild pain.   amLODipine 10 MG tablet Commonly known as: NORVASC Take 1 tablet (10 mg total) by mouth daily. What changed: when to take this   atorvastatin 40 MG tablet Commonly known as: LIPITOR Take 1 tablet (40 mg total) by mouth daily. At night What changed: when to take this   BD SafetyGlide Needle 18G X 1-1/2" Misc Generic drug: NEEDLE (DISP) 18 G Draw up medication with this needle   benazepril 40 MG tablet Commonly known as: LOTENSIN Take 1 tablet (40 mg total) by mouth daily. What changed: when to take this   clopidogrel 75 MG tablet Commonly known as: PLAVIX Take 1 tablet (75 mg total) by mouth daily. What changed: when to take this   ezetimibe 10 MG tablet Commonly known as: ZETIA Take 1 tablet (10 mg total) by mouth daily. What changed: when to take this   ibuprofen 200 MG tablet Commonly known as: ADVIL Take 400 mg by mouth every 8 (eight) hours as needed (pain.).   multivitamin with minerals tablet Take 1 tablet by mouth every evening. Men 50+   oxyCODONE 5 MG immediate release tablet Commonly known as: Oxy IR/ROXICODONE Take 1 tablet (5 mg total) by mouth every 4 (four) hours as needed for severe pain.   pantoprazole 40 MG tablet Commonly known as: PROTONIX TAKE 1 TABLET BY MOUTH EVERY DAY 30 MINUTES BEFORE A MEAL What changed:  how much to take when to take this   silodosin 8 MG Caps capsule Commonly known as: RAPAFLO Take 1 capsule (8 mg total) by mouth daily with breakfast. What changed:  when to take this reasons to take this   SYRINGE-NEEDLE (DISP) 3 ML 23G X 1-1/2" 3 ML Misc Use as directed QOWEEK   testosterone cypionate 200 MG/ML injection Commonly known as: DEPOTESTOSTERONE CYPIONATE ADMINISTER 1.5 ML(300 MG) IN THE MUSCLE EVERY 14 DAYS         Allergies:  Allergies  Allergen Reactions   Tamsulosin Other (See Comments)    Chest discomfort, basal dilation with a warm feeling in chest     Family History: Family History  Problem Relation Age of Onset   Arthritis Mother    Heart disease Father        CAD - MI   Stroke Father    Hypertension Father    Cancer Father 57       esophageal cancer    Arthritis Maternal Grandmother     Social History:  reports that he has been smoking cigarettes. He has a 22.00 pack-year smoking history. He has never used smokeless tobacco.  He reports current alcohol use of about 10.0 standard drinks per week. He reports that he does not use drugs.  ROS: Pertinent ROS in HPI  Physical Exam: Constitutional:  Well nourished. Alert and oriented, No acute distress. HEENT: [place] AT, mask in place.  Trachea midline Cardiovascular: No clubbing, cyanosis, or edema. Respiratory: Normal respiratory effort, no increased work of breathing. Neurologic: Grossly intact, no focal deficits, moving all 4 extremities. Psychiatric: Normal mood and affect.  Laboratory Data: Lab Results  Component Value Date   WBC 6.6 01/03/2022   HGB 16.4 01/03/2022   HCT 49.5 01/03/2022   MCV 93.5 01/03/2022   PLT 130.0 (L) 01/03/2022    Lab Results  Component Value Date   CREATININE 1.30 (H) 01/03/2022    Lab Results  Component Value Date   PSA 1.57 12/03/2018   PSA 0.96 12/03/2017   PSA 0.69 01/06/2014    Lab Results  Component Value Date   TESTOSTERONE 753 08/04/2021    Lab Results  Component Value Date   HGBA1C 6.3 01/03/2022    Lab Results  Component Value Date   TSH 1.50 01/03/2022       Component Value Date/Time   CHOL 123 06/20/2021 1030   CHOL 167 12/17/2019 1113   HDL 57.50 06/20/2021 1030   HDL 86 12/17/2019 1113   CHOLHDL 2 06/20/2021 1030   VLDL 15.4 06/20/2021 1030   LDLCALC 50 06/20/2021 1030   LDLCALC 65 12/17/2019 1113    Lab Results  Component Value Date   AST 21  01/03/2022   Lab Results  Component Value Date   ALT 25 01/03/2022     Urinalysis    Component Value Date/Time   COLORURINE YELLOW 01/03/2022 1111   APPEARANCEUR CLEAR 01/03/2022 1111   APPEARANCEUR Clear 10/18/2021 1622   LABSPEC 1.009 01/03/2022 1111   PHURINE 6.5 01/03/2022 1111   GLUCOSEU NEGATIVE 01/03/2022 1111   GLUCOSEU NEGATIVE 12/03/2017 1337   HGBUR TRACE (A) 01/03/2022 1111   BILIRUBINUR NEGATIVE 12/01/2021 0322   BILIRUBINUR Negative 10/18/2021 1622   KETONESUR NEGATIVE 01/03/2022 1111   PROTEINUR NEGATIVE 01/03/2022 1111   UROBILINOGEN 0.2 12/03/2017 1337   NITRITE NEGATIVE 01/03/2022 1111   LEUKOCYTESUR 3+ (A) 01/03/2022 1111    I have reviewed the labs.   Pertinent Imaging:  01/12/22 08:37  Scan Result 78 ml   Assessment & Plan:  ***  1. Difficulty urinating *** - BLADDER SCAN AMB NON-IMAGING   No follow-ups on file.  These notes generated with voice recognition software. I apologize for typographical errors.  Zara Council, PA-C  [facility] [address]  [place], [place] [address] [phone]

## 2022-01-22 ENCOUNTER — Other Ambulatory Visit: Payer: Self-pay | Admitting: Urology

## 2022-01-23 ENCOUNTER — Other Ambulatory Visit: Payer: Self-pay | Admitting: Urology

## 2022-01-23 DIAGNOSIS — N2889 Other specified disorders of kidney and ureter: Secondary | ICD-10-CM

## 2022-01-23 DIAGNOSIS — N281 Cyst of kidney, acquired: Secondary | ICD-10-CM

## 2022-01-24 ENCOUNTER — Encounter: Payer: Self-pay | Admitting: Urology

## 2022-01-25 ENCOUNTER — Ambulatory Visit: Payer: TRICARE For Life (TFL) | Admitting: Cardiovascular Disease

## 2022-01-26 ENCOUNTER — Ambulatory Visit (INDEPENDENT_AMBULATORY_CARE_PROVIDER_SITE_OTHER): Payer: Medicare Other | Admitting: Surgery

## 2022-01-26 ENCOUNTER — Encounter: Payer: Self-pay | Admitting: Surgery

## 2022-01-26 ENCOUNTER — Other Ambulatory Visit: Payer: Self-pay

## 2022-01-26 VITALS — BP 162/78 | HR 57 | Temp 98.0°F | Ht 71.0 in | Wt 218.6 lb

## 2022-01-26 DIAGNOSIS — Z09 Encounter for follow-up examination after completed treatment for conditions other than malignant neoplasm: Secondary | ICD-10-CM

## 2022-01-26 DIAGNOSIS — K429 Umbilical hernia without obstruction or gangrene: Secondary | ICD-10-CM

## 2022-01-26 DIAGNOSIS — K4020 Bilateral inguinal hernia, without obstruction or gangrene, not specified as recurrent: Secondary | ICD-10-CM

## 2022-01-26 NOTE — Patient Instructions (Addendum)
If you have any concerns or questions, please feel free to call our office. Follow up as needed. ? ? ?GENERAL POST-OPERATIVE ?PATIENT INSTRUCTIONS  ? ?WOUND CARE INSTRUCTIONS:  Keep a dry clean dressing on the wound if there is drainage. The initial bandage may be removed after 24 hours.  Once the wound has quit draining you may leave it open to air.  If clothing rubs against the wound or causes irritation and the wound is not draining you may cover it with a dry dressing during the daytime.  Try to keep the wound dry and avoid ointments on the wound unless directed to do so.  If the wound becomes bright red and painful or starts to drain infected material that is not clear, please contact your physician immediately.  If the wound is mildly pink and has a thick firm ridge underneath it, this is normal, and is referred to as a healing ridge.  This will resolve over the next 4-6 weeks. ? ?BATHING: ?You may shower if you have been informed of this by your surgeon. However, Please do not submerge in a tub, hot tub, or pool until incisions are completely sealed or have been told by your surgeon that you may do so. ? ?DIET:  You may eat any foods that you can tolerate.  It is a good idea to eat a high fiber diet and take in plenty of fluids to prevent constipation.  If you do become constipated you may want to take a mild laxative or take ducolax tablets on a daily basis until your bowel habits are regular.  Constipation can be very uncomfortable, along with straining, after recent surgery. ? ?ACTIVITY:  You are encouraged to cough and deep breath or use your incentive spirometer if you were given one, every 15-30 minutes when awake.  This will help prevent respiratory complications and low grade fevers post-operatively if you had a general anesthetic.  You may want to hug a pillow when coughing and sneezing to add additional support to the surgical area, if you had abdominal or chest surgery, which will decrease pain  during these times.  You are encouraged to walk and engage in light activity for the next two weeks.  You should not lift, push or pull more than 15-20 pounds, until 02/22/2022 as it could put you at increased risk for complications.  Twenty pounds is roughly equivalent to a plastic bag of groceries. At that time- Listen to your body when lifting, if you have pain when lifting, stop and then try again in a few days. Soreness after doing exercises or activities of daily living is normal as you get back in to your normal routine. ? ?MEDICATIONS:  Try to take narcotic medications and anti-inflammatory medications, such as tylenol, ibuprofen, naprosyn, etc., with food.  This will minimize stomach upset from the medication.  Should you develop nausea and vomiting from the pain medication, or develop a rash, please discontinue the medication and contact your physician.  You should not drive, make important decisions, or operate machinery when taking narcotic pain medication. ? ?SUNBLOCK ?Use sun block to incision area over the next year if this area will be exposed to sun. This helps decrease scarring and will allow you avoid a permanent darkened area over your incision. ? ?QUESTIONS:  Please feel free to call our office if you have any questions, and we will be glad to assist you.  ? ?

## 2022-01-26 NOTE — Progress Notes (Signed)
01/26/2022 ? ?HPI: ?Christian Cole is a 71 y.o. male s/p open left inguinal hernia repair, robotic assisted right inguinal hernia repair, and open umbilical hernia repair on 01/11/22.  Patient presents for follow up.  Reports that he's doing well and only required a few doses of pain medication.  Has noted that the initial swelling from surgery is improving.  No issues with the incisions.  Reports the penile prosthetic implant has not had any issues after surgery. ? ?Vital signs: ?BP (!) 162/78   Pulse (!) 57   Temp 98 ?F (36.7 ?C) (Oral)   Ht '5\' 11"'$  (1.803 m)   Wt 218 lb 9.6 oz (99.2 kg)   SpO2 100%   BMI 30.49 kg/m?   ? ?Physical Exam: ?Constitutional: No acute distress ?Abdomen:  Soft, non-distended, non-tender to palpation.  No evidence of recurrent hernia in either groin or umbilicus.  Left groin incision is healing well.  There's area of firmness under the incision consistent with scarring/inflammation.  Robotic port incisions healing well, no evidence of infection. ? ?Assessment/Plan: ?This is a 71 y.o. male s/p open left inguinal hernia, robotic assisted right inguinal hernia, and open umbilical hernia repair. ? ?--Patient is healing well, without any complications at this point.   ?--Reviewed with him activity restrictions for 6 weeks total to allow for all incisions to heal well. ?--Follow up as needed. ? ? ?Melvyn Neth, MD ?[facility]  ?

## 2022-01-29 ENCOUNTER — Encounter: Payer: Self-pay | Admitting: Urology

## 2022-02-01 ENCOUNTER — Encounter: Payer: Self-pay | Admitting: Oncology

## 2022-02-07 ENCOUNTER — Other Ambulatory Visit: Payer: Self-pay

## 2022-02-07 ENCOUNTER — Ambulatory Visit
Admission: RE | Admit: 2022-02-07 | Discharge: 2022-02-07 | Disposition: A | Discharge status: Home / Self Care | Payer: Medicare Other | Source: Ambulatory Visit | Attending: Urology | Admitting: Urology

## 2022-02-07 DIAGNOSIS — N281 Cyst of kidney, acquired: Secondary | ICD-10-CM | POA: Diagnosis not present

## 2022-02-07 DIAGNOSIS — K760 Fatty (change of) liver, not elsewhere classified: Secondary | ICD-10-CM | POA: Diagnosis not present

## 2022-02-07 MED ORDER — GADOBUTROL 1 MMOL/ML IV SOLN
9.0000 mL | Freq: Once | INTRAVENOUS | Status: AC | PRN
  Administered 2022-02-07: 9 mL via INTRAVENOUS

## 2022-02-14 ENCOUNTER — Inpatient Hospital Stay: Payer: Medicare Other | Attending: Oncology

## 2022-02-14 ENCOUNTER — Inpatient Hospital Stay: Payer: Medicare Other

## 2022-02-14 ENCOUNTER — Encounter: Payer: Self-pay | Admitting: Oncology

## 2022-02-14 ENCOUNTER — Inpatient Hospital Stay (HOSPITAL_BASED_OUTPATIENT_CLINIC_OR_DEPARTMENT_OTHER): Payer: Medicare Other | Admitting: Oncology

## 2022-02-14 VITALS — BP 147/84 | HR 87 | Temp 98.3°F | Wt 212.0 lb

## 2022-02-14 DIAGNOSIS — I10 Essential (primary) hypertension: Secondary | ICD-10-CM | POA: Diagnosis not present

## 2022-02-14 DIAGNOSIS — F17210 Nicotine dependence, cigarettes, uncomplicated: Secondary | ICD-10-CM | POA: Insufficient documentation

## 2022-02-14 DIAGNOSIS — D751 Secondary polycythemia: Secondary | ICD-10-CM | POA: Insufficient documentation

## 2022-02-14 DIAGNOSIS — D696 Thrombocytopenia, unspecified: Secondary | ICD-10-CM | POA: Diagnosis not present

## 2022-02-14 DIAGNOSIS — Z808 Family history of malignant neoplasm of other organs or systems: Secondary | ICD-10-CM | POA: Diagnosis not present

## 2022-02-14 LAB — COMPREHENSIVE METABOLIC PANEL
ALT: 23 U/L (ref 0–44)
AST: 27 U/L (ref 15–41)
Albumin: 4.4 g/dL (ref 3.5–5.0)
Alkaline Phosphatase: 77 U/L (ref 38–126)
Anion gap: 8 (ref 5–15)
BUN: 22 mg/dL (ref 8–23)
CO2: 23 mmol/L (ref 22–32)
Calcium: 8.8 mg/dL — ABNORMAL LOW (ref 8.9–10.3)
Chloride: 101 mmol/L (ref 98–111)
Creatinine, Ser: 1.27 mg/dL — ABNORMAL HIGH (ref 0.61–1.24)
GFR, Estimated: >60 mL/min (ref >60)
Glucose, Bld: 127 mg/dL — ABNORMAL HIGH (ref 70–99)
Potassium: 3.9 mmol/L (ref 3.5–5.1)
Sodium: 132 mmol/L — ABNORMAL LOW (ref 135–145)
Total Bilirubin: 0.9 mg/dL (ref 0.3–1.2)
Total Protein: 7.2 g/dL (ref 6.5–8.1)

## 2022-02-14 LAB — CBC WITH DIFFERENTIAL/PLATELET
Abs Immature Granulocytes: 0.04 10*3/uL (ref 0.00–0.07)
Basophils Absolute: 0 10*3/uL (ref 0.0–0.1)
Basophils Relative: 0 %
Eosinophils Absolute: 0.1 10*3/uL (ref 0.0–0.5)
Eosinophils Relative: 1 %
HCT: 51.4 % (ref 39.0–52.0)
Hemoglobin: 17.2 g/dL — ABNORMAL HIGH (ref 13.0–17.0)
Immature Granulocytes: 0 %
Lymphocytes Relative: 28 %
Lymphs Abs: 2.7 10*3/uL (ref 0.7–4.0)
MCH: 31 pg (ref 26.0–34.0)
MCHC: 33.5 g/dL (ref 30.0–36.0)
MCV: 92.6 fL (ref 80.0–100.0)
Monocytes Absolute: 1.1 10*3/uL — ABNORMAL HIGH (ref 0.1–1.0)
Monocytes Relative: 11 %
Neutro Abs: 5.6 10*3/uL (ref 1.7–7.7)
Neutrophils Relative %: 60 %
Platelets: 132 10*3/uL — ABNORMAL LOW (ref 150–400)
RBC: 5.55 MIL/uL (ref 4.22–5.81)
RDW: 14.9 % (ref 11.5–15.5)
WBC: 9.5 10*3/uL (ref 4.0–10.5)
nRBC: 0 % (ref 0.0–0.2)

## 2022-02-14 NOTE — Progress Notes (Signed)
Therapeutic phlebotomy performed in [facility] using 20g angiocath. 546m removed. Pt tolerated well without incident. Oral hydration provided. Vital signs stable at discharge.  ?

## 2022-02-14 NOTE — Progress Notes (Signed)
?Hematology/Oncology Progress note ?[phone] [phone] ?  ? ? ? ?Patient Care Team: ?[name], Nino Glow, MD as PCP - General (Internal Medicine) ?Troy Sine, MD as PCP - Cardiology (Cardiology) ? ?REFERRING PROVIDER: ?[name], [name] * ? ?CHIEF COMPLAINTS/REASON FOR VISIT:  ?Follow-up for thrombocytopenia and erythrocytosis ? ?HISTORY OF PRESENTING ILLNESS:  ?Christian Cole is a 71 y.o. male who was seen in consultation at the request of [name], [name] * for evaluation of thrombocytopenia and erythrocytosis.  ? ? ?Reviewed patient's labs done previously.  ?12/21/2020 labs showed decreased platelet counts at 1 39,000. ?wbc 8.4 hemoglobin 18.3. ?Reviewed patient's previous labs. Thrombocytopenia is chronic chronic onset , since at least 2015. ?No aggravating or elevated factors. ?Erythrocytosis, started after patient was started on testosterone replacement therapy. ?He previously never had erythrocytosis sent to them. ?Associated symptoms or signs:  ?Denies weight loss, fever, chills, fatigue, night sweats.  ?Denies hematochezia, hematuria, hematemesis, epistaxis, black tarry stool.  easy bruising.   ?Patient drinks 3 beers a day. ? ?Sleep apnea, patient reports being compliant with CPAP machine ? ?INTERVAL HISTORY ?Christian Cole is a 71 y.o. male who has above history reviewed by me today presents for follow up visit for management of erythrocytosis and thrombocytopenia. ?Patient has no new complaints.  He is on testosterone replacement therapy. ?. ?Review of Systems  ?Constitutional:  Negative for appetite change, chills, fatigue, fever and unexpected weight change.  ?HENT:   Negative for hearing loss and voice change.   ?Eyes:  Negative for eye problems and icterus.  ?Respiratory:  Negative for chest tightness, cough and shortness of breath.   ?Cardiovascular:  Negative for chest pain and leg swelling.  ?Gastrointestinal:  Negative for abdominal distention  and abdominal pain.  ?Endocrine: Negative for hot flashes.  ?Genitourinary:  Negative for difficulty urinating, dysuria and frequency.   ?Musculoskeletal:  Negative for arthralgias.  ?Skin:  Negative for itching and rash.  ?Neurological:  Negative for light-headedness and numbness.  ?Hematological:  Negative for adenopathy. Does not bruise/bleed easily.  ?Psychiatric/Behavioral:  Negative for confusion.   ? ?MEDICAL HISTORY:  ?Past Medical History:  ?Diagnosis Date  ? Aortic atherosclerosis ([place])   ? BPH (benign prostatic hyperplasia)   ? CAD (coronary artery disease)   ? a. s/p CABG in 2000 with LIMA-LAD, RIMA-PDA, SVG-D1, and SVG-PLA b. low-risk NST in 2016  ? Carotid artery stenosis   ? Cyst of right kidney   ? ED (erectile dysfunction)   ? a.) penile implant in place  ? Fatty liver   ? GERD (gastroesophageal reflux disease)   ? Hyperlipidemia   ? Hypertension   ? Long term current use of antithrombotics/antiplatelets   ? a.) clopidogrel  ? Low testosterone   ? a.) on exogenous TRT  ? OSA on CPAP   ? Pre-diabetes   ? S/P CABG x 4 2000  ? a.) LIMA-LAD, RIMA-PDA, SVG-D1, SVG-PLA  ? Secondary erythrocytosis   ? a.) smoking + TRT + OSAH  ? Sinus bradycardia   ? Skin cancer   ? Thrombocytopenia ([place])   ? Tongue lesion   ? benign from trauma as of 06/20/21  ? ? ?SURGICAL HISTORY: ?Past Surgical History:  ?Procedure Laterality Date  ? CATARACT EXTRACTION, BILATERAL  2010  ? COLONOSCOPY WITH PROPOFOL N/A 04/15/2020  ? Procedure: COLONOSCOPY WITH PROPOFOL;  Surgeon: Robert Bellow, MD;  Location: [facility] ENDOSCOPY;  Service: Endoscopy;  Laterality: N/A;  ? CORONARY ARTERY BYPASS GRAFT  2000  ? LIMA to  the LAD,RIMA to the PDA,vein graft to the diagonal & vein graft to the PLA  ? ESOPHAGOGASTRODUODENOSCOPY    ? Dr. Gustavo Lah  ? ESOPHAGOGASTRODUODENOSCOPY  04/15/2020  ? Procedure: ESOPHAGOGASTRODUODENOSCOPY (EGD);  Surgeon: Robert Bellow, MD;  Location: [facility] ENDOSCOPY;  Service: Endoscopy;;  ? HOLEP-LASER ENUCLEATION OF  THE PROSTATE WITH MORCELLATION N/A 11/27/2021  ? Procedure: HOLEP-LASER ENUCLEATION OF THE PROSTATE WITH MORCELLATION;  Surgeon: Hollice Espy, MD;  Location: [facility] ORS;  Service: Urology;  Laterality: N/A;  ? INGUINAL HERNIA REPAIR Left 01/11/2022  ? Procedure: HERNIA REPAIR INGUINAL ADULT;  Surgeon: Olean Ree, MD;  Location: [facility] ORS;  Service: General;  Laterality: Left;  ? KNEE ARTHROSCOPY Right 1991  ? KNEE DEBRIDEMENT Right 1989  ? Fluid flush  ? PENILE PROSTHESIS IMPLANT    ? 06/26/19 Dr. Francesca Jewett  ? SHOULDER ARTHROSCOPY Left 2011  ? UMBILICAL HERNIA REPAIR N/A 01/11/2022  ? Procedure: HERNIA REPAIR UMBILICAL ADULT;  Surgeon: Olean Ree, MD;  Location: [facility] ORS;  Service: General;  Laterality: N/A;  ? US ECHOCARDIOGRAPHY  12/12/2010  ? mild LA dilatation, normal LV systolic fx, EF > 43%  ? XI ROBOTIC ASSISTED INGUINAL HERNIA REPAIR WITH MESH Right 01/11/2022  ? Procedure: XI ROBOTIC ASSISTED INGUINAL HERNIA REPAIR WITH MESH;  Surgeon: Olean Ree, MD;  Location: [facility] ORS;  Service: General;  Laterality: Right;  ? ? ?SOCIAL HISTORY: ?Social History  ? ?Socioeconomic History  ? Marital status: Married  ?  Spouse name: Not on file  ? Number of children: 3  ? Years of education: 35  ? Highest education level: Not on file  ?Occupational History  ? Occupation: Retired Corporate treasurer  ? Occupation: Management/Technical Support  ?  Comment: [employer]  ?Tobacco Use  ? Smoking status: Every Day  ?  Packs/day: 0.50  ?  Years: 44.00  ?  Pack years: 22.00  ?  Types: Cigarettes  ? Smokeless tobacco: Never  ? Tobacco comments:  ?  on and off smoker   ?Vaping Use  ? Vaping Use: Never used  ?Substance and Sexual Activity  ? Alcohol use: Yes  ?  Alcohol/week: 10.0 standard drinks  ?  Types: 10 Cans of beer per week  ?  Comment: Down to 3 beers a day from 4-6 a day   ? Drug use: No  ? Sexual activity: Yes  ?  Partners: Female  ?  Comment: Wife  ?Other Topics Concern  ? Not on file  ?Social History Narrative  ? Jevaughn grew up in  [place]. He is currently living in [place] with his wife. This is his second marriage. He has 1 daughter and 2 sons from his first marriage. He has 4 step kids. His daughter lives in [place], one son in [place], [place] and the other son in [place], [place]. 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   ?   ? 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   ? He enjoys wood working on his spare time. 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   ?   ? He retired 04/2019 and enjoys this   ?   ? 2 brothers and 2 sisters he is next to youngest   ? ?Social Determinants of Health  ? ?Financial Resource Strain: Not on file  ?Food Insecurity: Not on file  ?Transportation Needs: Not on file  ?Physical Activity: Not on file  ?  Stress: Not on file  ?Social Connections: Not on file  ?Intimate Partner Violence: Not on file  ? ? ?FAMILY HISTORY: ?Family History  ?Problem Relation Age of Onset  ? Arthritis Mother   ? Heart disease Father   ?     CAD - MI  ? Stroke Father   ? Hypertension Father   ? Cancer Father 72  ?     esophageal cancer   ? Arthritis Maternal Grandmother   ? ? ?ALLERGIES:  is allergic to tamsulosin. ? ?MEDICATIONS:  ?Current Outpatient Medications  ?Medication Sig Dispense Refill  ? acetaminophen (TYLENOL) 500 MG tablet Take 2 tablets (1,000 mg total) by mouth every 6 (six) hours as needed for mild pain.    ? amLODipine (NORVASC) 10 MG tablet Take 1 tablet (10 mg total) by mouth daily. (Patient taking differently: Take 10 mg by mouth every evening.) 90 tablet 3  ? atorvastatin (LIPITOR) 40 MG tablet Take 1 tablet (40 mg total) by mouth daily. At night (Patient taking differently: Take 40 mg by mouth every evening. At night) 90 tablet 3  ? benazepril (LOTENSIN) 40 MG tablet Take 1 tablet (40 mg total) by mouth daily. (Patient taking differently: Take 40 mg by mouth every evening.) 90 tablet 3  ? clopidogrel  (PLAVIX) 75 MG tablet Take 1 tablet (75 mg total) by mouth daily. (Patient taking differently: Take 75 mg by mouth every evening.) 90 tablet 2  ? ezetimibe (ZETIA) 10 MG tablet Take 1 tablet (10 mg total) by mou

## 2022-02-14 NOTE — Patient Instructions (Signed)

## 2022-03-02 ENCOUNTER — Other Ambulatory Visit: Payer: Self-pay | Admitting: *Deleted

## 2022-03-02 ENCOUNTER — Telehealth: Payer: Self-pay | Admitting: *Deleted

## 2022-03-02 DIAGNOSIS — Z87891 Personal history of nicotine dependence: Secondary | ICD-10-CM

## 2022-03-02 DIAGNOSIS — Z122 Encounter for screening for malignant neoplasm of respiratory organs: Secondary | ICD-10-CM

## 2022-03-02 DIAGNOSIS — F17210 Nicotine dependence, cigarettes, uncomplicated: Secondary | ICD-10-CM

## 2022-03-02 NOTE — Telephone Encounter (Signed)
LMTC to schedule Yearly Lung CA CT Scan. 

## 2022-03-02 NOTE — Progress Notes (Signed)
Ct

## 2022-03-09 ENCOUNTER — Encounter: Payer: Self-pay | Admitting: Oncology

## 2022-03-13 ENCOUNTER — Ambulatory Visit
Admission: RE | Admit: 2022-03-13 | Discharge: 2022-03-13 | Disposition: A | Discharge status: Home / Self Care | Payer: Medicare Other | Source: Ambulatory Visit | Attending: Acute Care | Admitting: Acute Care

## 2022-03-13 DIAGNOSIS — J439 Emphysema, unspecified: Secondary | ICD-10-CM | POA: Insufficient documentation

## 2022-03-13 DIAGNOSIS — I700 Atherosclerosis of aorta: Secondary | ICD-10-CM | POA: Insufficient documentation

## 2022-03-13 DIAGNOSIS — Z87891 Personal history of nicotine dependence: Secondary | ICD-10-CM | POA: Diagnosis not present

## 2022-03-13 DIAGNOSIS — Z122 Encounter for screening for malignant neoplasm of respiratory organs: Secondary | ICD-10-CM | POA: Insufficient documentation

## 2022-03-13 DIAGNOSIS — F17210 Nicotine dependence, cigarettes, uncomplicated: Secondary | ICD-10-CM

## 2022-03-14 ENCOUNTER — Other Ambulatory Visit: Payer: Self-pay

## 2022-03-14 DIAGNOSIS — Z87891 Personal history of nicotine dependence: Secondary | ICD-10-CM

## 2022-03-14 DIAGNOSIS — D485 Neoplasm of uncertain behavior of skin: Secondary | ICD-10-CM | POA: Diagnosis not present

## 2022-03-14 DIAGNOSIS — Z85828 Personal history of other malignant neoplasm of skin: Secondary | ICD-10-CM | POA: Diagnosis not present

## 2022-03-14 DIAGNOSIS — L570 Actinic keratosis: Secondary | ICD-10-CM | POA: Diagnosis not present

## 2022-03-14 DIAGNOSIS — D2261 Melanocytic nevi of right upper limb, including shoulder: Secondary | ICD-10-CM | POA: Diagnosis not present

## 2022-03-14 DIAGNOSIS — D2262 Melanocytic nevi of left upper limb, including shoulder: Secondary | ICD-10-CM | POA: Diagnosis not present

## 2022-03-14 DIAGNOSIS — Z122 Encounter for screening for malignant neoplasm of respiratory organs: Secondary | ICD-10-CM

## 2022-03-14 DIAGNOSIS — D2272 Melanocytic nevi of left lower limb, including hip: Secondary | ICD-10-CM | POA: Diagnosis not present

## 2022-03-14 DIAGNOSIS — C44729 Squamous cell carcinoma of skin of left lower limb, including hip: Secondary | ICD-10-CM | POA: Diagnosis not present

## 2022-03-14 DIAGNOSIS — F17210 Nicotine dependence, cigarettes, uncomplicated: Secondary | ICD-10-CM

## 2022-04-05 ENCOUNTER — Other Ambulatory Visit: Payer: Medicare Other

## 2022-04-05 DIAGNOSIS — E291 Testicular hypofunction: Secondary | ICD-10-CM | POA: Diagnosis not present

## 2022-04-05 DIAGNOSIS — N401 Enlarged prostate with lower urinary tract symptoms: Secondary | ICD-10-CM | POA: Diagnosis not present

## 2022-04-05 DIAGNOSIS — N138 Other obstructive and reflux uropathy: Secondary | ICD-10-CM

## 2022-04-06 DIAGNOSIS — C44729 Squamous cell carcinoma of skin of left lower limb, including hip: Secondary | ICD-10-CM | POA: Diagnosis not present

## 2022-04-06 LAB — TESTOSTERONE: Testosterone: >1500 ng/dL — ABNORMAL HIGH (ref 264–916)

## 2022-04-06 LAB — HEMOGLOBIN AND HEMATOCRIT, BLOOD
Hematocrit: 50.4 % (ref 37.5–51.0)
Hemoglobin: 16.9 g/dL (ref 13.0–17.7)

## 2022-04-06 LAB — PSA: Prostate Specific Ag, Serum: 0.6 ng/mL (ref 0.0–4.0)

## 2022-04-11 ENCOUNTER — Ambulatory Visit (INDEPENDENT_AMBULATORY_CARE_PROVIDER_SITE_OTHER): Payer: Medicare Other | Admitting: Urology

## 2022-04-11 ENCOUNTER — Encounter: Payer: Self-pay | Admitting: Urology

## 2022-04-11 VITALS — BP 128/70 | HR 72 | Ht 71.0 in | Wt 205.0 lb

## 2022-04-11 DIAGNOSIS — N138 Other obstructive and reflux uropathy: Secondary | ICD-10-CM

## 2022-04-11 DIAGNOSIS — E291 Testicular hypofunction: Secondary | ICD-10-CM | POA: Diagnosis not present

## 2022-04-11 DIAGNOSIS — N281 Cyst of kidney, acquired: Secondary | ICD-10-CM

## 2022-04-11 DIAGNOSIS — N401 Enlarged prostate with lower urinary tract symptoms: Secondary | ICD-10-CM

## 2022-04-11 NOTE — Progress Notes (Signed)
04/11/2022 9:38 AM   Christian Cole [birth year] [id number]  Referring provider: [name], Nino Glow, MD [place],  [place] [address]  Chief Complaint  Patient presents with   Hypogonadism    Urologic history: 1.  Hypogonadism TRT testosterone cypionate   2.  Erectile dysfunction Penile prosthesis; [facility] 06/2019  3.  BPH with LUTS HoLEP Dr. Erlene Quan 11/17/2021 Benign pathology  4.  Indeterminant right renal lesion 1.5 cm exophytic lesion right kidney MRI 01/2022 felt consistent with hemorrhagic cyst however had significant motion artifact causing questionable mural enhancement   HPI: 71 y.o. male presents for follow-up visit.  Voiding without problems.  Mild stress incontinence which has significantly improved Labs 04/05/2022: Testosterone >1500, PSA 0.6, HCT 50.4 Last injection prior to this blood draw was 2 days after his injection Abdominal MRI results as above   PMH: Past Medical History:  Diagnosis Date   Aortic atherosclerosis (HCC)    BPH (benign prostatic hyperplasia)    CAD (coronary artery disease)    a. s/p CABG in 2000 with LIMA-LAD, RIMA-PDA, SVG-D1, and SVG-PLA b. low-risk NST in 2016   Carotid artery stenosis    Cyst of right kidney    ED (erectile dysfunction)    a.) penile implant in place   Fatty liver    GERD (gastroesophageal reflux disease)    Hyperlipidemia    Hypertension    Long term current use of antithrombotics/antiplatelets    a.) clopidogrel   Low testosterone    a.) on exogenous TRT   OSA on CPAP    Pre-diabetes    S/P CABG x 4 2000   a.) LIMA-LAD, RIMA-PDA, SVG-D1, SVG-PLA   Secondary erythrocytosis    a.) smoking + TRT + OSAH   Sinus bradycardia    Skin cancer    Thrombocytopenia (HCC)    Tongue lesion    benign from trauma as of 06/20/21    Surgical History: Past Surgical History:  Procedure Laterality Date   CATARACT EXTRACTION, BILATERAL  2010   COLONOSCOPY WITH PROPOFOL N/A 04/15/2020    Procedure: COLONOSCOPY WITH PROPOFOL;  Surgeon: Robert Bellow, MD;  Location: [facility] ENDOSCOPY;  Service: Endoscopy;  Laterality: N/A;   CORONARY ARTERY BYPASS GRAFT  2000   LIMA to the LAD,RIMA to the PDA,vein graft to the diagonal & vein graft to the PLA   ESOPHAGOGASTRODUODENOSCOPY     Dr. Gustavo Lah   ESOPHAGOGASTRODUODENOSCOPY  04/15/2020   Procedure: ESOPHAGOGASTRODUODENOSCOPY (EGD);  Surgeon: Robert Bellow, MD;  Location: [facility] ENDOSCOPY;  Service: Endoscopy;;   HOLEP-LASER ENUCLEATION OF THE PROSTATE WITH MORCELLATION N/A 11/27/2021   Procedure: HOLEP-LASER ENUCLEATION OF THE PROSTATE WITH MORCELLATION;  Surgeon: Hollice Espy, MD;  Location: [facility] ORS;  Service: Urology;  Laterality: N/A;   INGUINAL HERNIA REPAIR Left 01/11/2022   Procedure: HERNIA REPAIR INGUINAL ADULT;  Surgeon: Olean Ree, MD;  Location: [facility] ORS;  Service: General;  Laterality: Left;   KNEE ARTHROSCOPY Right 1991   KNEE DEBRIDEMENT Right 1989   Fluid flush   PENILE PROSTHESIS IMPLANT     06/26/19 Dr. Francesca Jewett   SHOULDER ARTHROSCOPY Left [id number]   [place] N/A 01/11/2022   Procedure: HERNIA REPAIR UMBILICAL ADULT;  Surgeon: Olean Ree, MD;  Location: [facility] ORS;  Service: General;  Laterality: N/A;   US ECHOCARDIOGRAPHY  12/12/2010   mild LA dilatation, normal LV systolic fx, EF > 97%   XI ROBOTIC ASSISTED INGUINAL HERNIA REPAIR WITH MESH Right 01/11/2022   Procedure: XI ROBOTIC ASSISTED INGUINAL  HERNIA REPAIR WITH MESH;  Surgeon: Olean Ree, MD;  Location: [facility] ORS;  Service: General;  Laterality: Right;    Home Medications:  Allergies as of 04/11/2022       Reactions   Tamsulosin Other (See Comments)   Chest discomfort, basal dilation with a warm feeling in chest         Medication List        Accurate as of Apr 11, 2022  9:38 AM. If you have any questions, ask your nurse or doctor.          acetaminophen 500 MG tablet Commonly known as: TYLENOL Take 2 tablets (1,000 mg  total) by mouth every 6 (six) hours as needed for mild pain.   amLODipine 10 MG tablet Commonly known as: NORVASC Take 1 tablet (10 mg total) by mouth daily. What changed: when to take this   atorvastatin 40 MG tablet Commonly known as: LIPITOR Take 1 tablet (40 mg total) by mouth daily. At night What changed: when to take this   B-D HYPODERMIC NEEDLE 18GX1.5" 18G X 1-1/2" Misc Generic drug: NEEDLE (DISP) 18 G DRW UP MEDICATION WITH THIS NEEDLE   benazepril 40 MG tablet Commonly known as: LOTENSIN Take 1 tablet (40 mg total) by mouth daily. What changed: when to take this   clopidogrel 75 MG tablet Commonly known as: PLAVIX Take 1 tablet (75 mg total) by mouth daily. What changed: when to take this   ezetimibe 10 MG tablet Commonly known as: ZETIA Take 1 tablet (10 mg total) by mouth daily. What changed: when to take this   ibuprofen 200 MG tablet Commonly known as: ADVIL Take 400 mg by mouth every 8 (eight) hours as needed (pain.).   multivitamin with minerals tablet Take 1 tablet by mouth every evening. Men 50+   pantoprazole 40 MG tablet Commonly known as: PROTONIX TAKE 1 TABLET BY MOUTH EVERY DAY 30 MINUTES BEFORE A MEAL What changed:  how much to take when to take this   SYRINGE-NEEDLE (DISP) 3 ML 23G X 1-1/2" 3 ML Misc Use as directed QOWEEK   testosterone cypionate 200 MG/ML injection Commonly known as: DEPOTESTOSTERONE CYPIONATE ADMINISTER 1.5 ML(300 MG) IN THE MUSCLE EVERY 14 DAYS        Allergies:  Allergies  Allergen Reactions   Tamsulosin Other (See Comments)    Chest discomfort, basal dilation with a warm feeling in chest     Family History: Family History  Problem Relation Age of Onset   Arthritis Mother    Heart disease Father        CAD - MI   Stroke Father    Hypertension Father    Cancer Father 45       esophageal cancer    Arthritis Maternal Grandmother     Social History:  reports that he has been smoking cigarettes. He  has a 22.00 pack-year smoking history. He has never used smokeless tobacco. He reports current alcohol use of about 10.0 standard drinks per week. He reports that he does not use drugs.   Physical Exam: BP 128/70   Pulse 72   Ht '5\' 11"'$  (1.803 m)   Wt 205 lb (93 kg)   BMI 28.59 kg/m   Constitutional:  Alert and oriented, No acute distress. HEENT: [place] AT, moist mucus membranes.  Trachea midline, no masses. Respiratory: Normal respiratory effort, no increased work of breathing. GI: Abdomen is soft, nontender, nondistended, no abdominal masses Psychiatric: Normal mood and affect.  Assessment & Plan:    1.  Hypogonadism Check trough testosterone level 83-monthlab visit testosterone/hematocrit 1 year office visit with testosterone/hematocrit/PSA  2.  Right renal lesion Probable hyperdense cyst RUS September 2023    SAbbie Sons MD  [place] [place] [phone]

## 2022-04-16 ENCOUNTER — Other Ambulatory Visit: Payer: Medicare Other

## 2022-04-16 DIAGNOSIS — N138 Other obstructive and reflux uropathy: Secondary | ICD-10-CM | POA: Diagnosis not present

## 2022-04-16 DIAGNOSIS — E291 Testicular hypofunction: Secondary | ICD-10-CM | POA: Diagnosis not present

## 2022-04-16 DIAGNOSIS — N401 Enlarged prostate with lower urinary tract symptoms: Secondary | ICD-10-CM

## 2022-04-17 ENCOUNTER — Encounter: Payer: Self-pay | Admitting: *Deleted

## 2022-04-17 LAB — HEMATOCRIT: Hematocrit: 46.9 % (ref 37.5–51.0)

## 2022-04-17 LAB — TESTOSTERONE: Testosterone: 515 ng/dL (ref 264–916)

## 2022-04-17 LAB — PSA: Prostate Specific Ag, Serum: 0.7 ng/mL (ref 0.0–4.0)

## 2022-05-03 ENCOUNTER — Other Ambulatory Visit: Payer: Self-pay | Admitting: *Deleted

## 2022-05-03 MED ORDER — TESTOSTERONE CYPIONATE 200 MG/ML IM SOLN: INTRAMUSCULAR | 0 refills | Status: DC

## 2022-05-09 ENCOUNTER — Ambulatory Visit (INDEPENDENT_AMBULATORY_CARE_PROVIDER_SITE_OTHER): Payer: Medicare Other | Admitting: Cardiovascular Disease

## 2022-05-09 ENCOUNTER — Encounter: Payer: Self-pay | Admitting: Cardiovascular Disease

## 2022-05-09 VITALS — BP 130/60 | HR 55 | Ht 71.0 in | Wt 212.4 lb

## 2022-05-09 DIAGNOSIS — I2510 Atherosclerotic heart disease of native coronary artery without angina pectoris: Secondary | ICD-10-CM

## 2022-05-09 DIAGNOSIS — D751 Secondary polycythemia: Secondary | ICD-10-CM | POA: Diagnosis not present

## 2022-05-09 DIAGNOSIS — Z9989 Dependence on other enabling machines and devices: Secondary | ICD-10-CM | POA: Diagnosis not present

## 2022-05-09 DIAGNOSIS — I440 Atrioventricular block, first degree: Secondary | ICD-10-CM | POA: Diagnosis not present

## 2022-05-09 DIAGNOSIS — G4733 Obstructive sleep apnea (adult) (pediatric): Secondary | ICD-10-CM | POA: Diagnosis not present

## 2022-05-09 DIAGNOSIS — I10 Essential (primary) hypertension: Secondary | ICD-10-CM

## 2022-05-09 DIAGNOSIS — D696 Thrombocytopenia, unspecified: Secondary | ICD-10-CM

## 2022-05-09 DIAGNOSIS — Z951 Presence of aortocoronary bypass graft: Secondary | ICD-10-CM

## 2022-05-09 NOTE — Patient Instructions (Signed)
Medication Instructions:  Your Physician recommend you continue on your current medication as directed.    *If you need a refill on your cardiac medications before your next appointment, please call your pharmacy*   Lab Work: None ordered today   Testing/Procedures: None ordered today   Follow-Up: At [facility], you and your health needs are our priority.  As part of our continuing mission to provide you with exceptional heart care, we have created designated Provider Care Teams.  These Care Teams include your primary Cardiologist (physician) and Advanced Practice Providers (APPs -  Physician Assistants and Nurse Practitioners) who all work together to provide you with the care you need, when you need it.  We recommend signing up for the patient portal called "MyChart".  Sign up information is provided on this After Visit Summary.  MyChart is used to connect with patients for Virtual Visits (Telemedicine).  Patients are able to view lab/test results, encounter notes, upcoming appointments, etc.  Non-urgent messages can be sent to your provider as well.   To learn more about what you can do with MyChart, go to [email or web address].    Your next appointment:   9 month(s)  The format for your next appointment:   In Person  Provider:   Shelva Majestic, MD

## 2022-05-09 NOTE — Progress Notes (Signed)
Patient ID: Christian Cole, male   DOB: 09/30/1951, 71 y.o.   MRN: [id number]     HPI: Christian Cole is a 71 y.o. male the presents for an 6 month cardiology/sleep evaluation.  Christian Cole has known CAD and underwent initial percutaneous coronary intervention in 1998 of an unknown vessel. He underwent CABG revascularization surgery in December 2000 with LIMA to LAD, LIMA to the PDA, vein to the diagonal, and then to the PLA. 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  An echo Doppler study in January 2002 showed normal systolic function. His mild TR and mild Christian. A nuclear perfusion study in March 2013 showed normal perfusion without scar or ischemia.  When I saw him in 2016 he had continued to do well and remained active and denied any chest pain.   He he had seen Christian Cole in March 2016 and she had reduced his metoprolol to 12.5 mg twice a day due to asymptomatic bradycardia.  He had blood work in December 2015 by his primary care physician and his total cholesterol was 166, HDL 32, LDL 104, VLDL 29 and triglycerides 149.  He has been maintained on atorvastatin 40 mg, Plavix 75 mg, pantoprazole 40 mg, as well as Viagra as needed.  He underwent a nuclear perfusion study on April 26, 2015.  He remained asymptomatic.  He developed asymptomatic ST segment depression during stress.  Scintigraphic images were entirely normal.  He had normal function.  Ejection fraction was 52%.  He presents for evaluation.  When I saw him in October 2017 he was doing well and was without chest pain.  He was exercising daily.  He had lost over 40 pounds in the 8 months prior to that evaluation.  His peak weight was 242 and his weight this morning without clothes at home was 195.  He continues to use CPAP with 100% compliance.   He was evaluated by Christian Cole in October  2018.  His blood pressure was elevated and amlodipine 5 mg was added to his benazepril and Lopressor.  He had resumed smoking and at that time was given a prescription for Chantix.  I saw him in December 2019 and prior to that evaluation he had  received a new CPAP machine which was prescribed by physician in [place].  He was sleeping well and cannot sleep without his CPAP.  Unfortunately he was still smoking 1/2 pack of cigarettes per day.  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  He walks at least 2 miles.  He denies any chest pain PND orthopnea.   He underwent carotid Dopplers in June 2020 which did not reveal any significant internal carotid stenoses in the 1 to 39% range.  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  A Myoview study in July 2020 showed EF 55 to 60% without scar or ischemia.  He was seen by Christian Deforest, PA for preoperative clearance and underwent successful inflatable penile prosthesis by Christian Cole at [facility] urology.  He tolerated surgery without cardiovascular compromise.  I saw him in February 2021 at which time he denied any anginal symptomatology.  Unfortunately he was still smoking.  He continued to be compliant with CPAP therapy and "cannot sleep without it."  I saw him on January 09, 2021.  He remained stable and denied any chest pain or shortness  of breath but unfortunately was still smoking.   He has been working around the house remodeling his bathroom.  He has been on amlodipine 7.5 mg, benazepril 40 mg for hypertension, atorvastatin 40 mg and Zetia for hyperlipidemia, and is on testosterone injections every 14 days with continued low testosterone levels.  He continues to use CPAP with 100% compliance.    I saw him on September 04, 2021 and since his prior evaluation he was evaluated at [facility] ER and presented with a warm sensation to the left side of his chest radiating to his neck.  It was not  exertionally precipitated.  A chest x-ray did not reveal any active cardiopulmonary disease.  His ECG did not show any ischemia.  Troponins were negative.  He believes he started to notice the symptoms when he was started on Flomax and he was felt to have possible sensitivity to this.  He is now on silodosin (Rapaflo) and is tolerating this well.  He continues to be on amlodipine 7.5 mg daily and benazepril 40 mg for hypertension.  He is on atorvastatin 40 mg and Zetia 10 mg for hyperlipidemia.  He is on testosterone supplementation every 2 weeks.  He continues to use CPAP therapy with [employer] as his DME company in [place].  A download was obtained from September 22 through September 01, 2021 which shows excellent compliance with average use at 8 hours and 16 minutes per night.  He has been using a Respironics DreamWear mask.  At 13 cm set pressure, AHI is 5.3.  He is still smoking at least a half a pack of cigarettes per day and his wife also smokes.  He has some discomfort from bilateral inguinal hernias for which he wears a truss.  During that evaluation, with his ongoing tobacco history, and chest pain symptomatology I recommended he undergo a Lexiscan Myoview study.  He was continuing to use CPAP with excellent compliance and "cannot sleep without it."   On October 13, 2021 he underwent his [place] study.  This was low risk and did not demonstrate any ischemia.  There was normal wall motion.    I last saw him on November 20, 2021.  On October 13, 2021 he had undergone a [place] Myoview study which was unchanged from previously and remained low risk without ischemia.  He had bilateral inguinal hernia as well as an umbilical hernia and was planning to undergo future surgery.  He continues to use CPAP with 100% compliance with his DME company called [employer] "in [place]. He denied recurrent chest pain.  He was sleeping well.  He typically goes to bed at 10 PM and wakes up at 6:30 AM.   Download was obtained of his CPAP unit from December 8 through November 17, 2021.  Compliance is excellent with average use of 7 hours and 27 minutes.  His current CPAP unit is not an auto unit and is set at a pressure of 14 cm.  AHI was slightly increased at 6.5.  He continues to be on amlodipine and benazepril for blood pressure control.  He is on testosterone injections every 2 weeks and often undergoes phlebotomy followed by hematology.  His next phlebotomy scheduled for January 11.    Since I saw him, he underwent open left inguinal hernia repair, robotic assisted right inguinal hernia repair, and open umbilical hernia repair on January 11, 2022 without cardiovascular compromise.  He also undergoes hematologic evaluation for his thrombocytopenia and erythrocytosis and undergoes periodic phlebotomy.  He denies any chest pain or shortness of breath.  He remains active.  He continues to use CPAP and a download from May 28 through May 07, 2022 confirms excellent compliance with average use at 7 hours and 18 minutes per night.  However, his CPAP is set at a pressure of 16 cm.  AHI is increased at 8.2.  He does not have a CPAP auto unit.  He presents for evaluation.  Past Medical History:  Diagnosis Date   Aortic atherosclerosis (HCC)    BPH (benign prostatic hyperplasia)    CAD (coronary artery disease)    a. s/p CABG in 2000 with LIMA-LAD, RIMA-PDA, SVG-D1, and SVG-PLA b. low-risk NST in 2016   Carotid artery stenosis    Cyst of right kidney    ED (erectile dysfunction)    a.) penile implant in place   Fatty liver    GERD (gastroesophageal reflux disease)    Hyperlipidemia    Hypertension    Long term current use of antithrombotics/antiplatelets    a.) clopidogrel   Low testosterone    a.) on exogenous TRT   OSA on CPAP    Pre-diabetes    S/P CABG x 4 2000   a.) LIMA-LAD, RIMA-PDA, SVG-D1, SVG-PLA   Secondary erythrocytosis    a.) smoking + TRT + OSAH   Sinus bradycardia    Skin cancer     Thrombocytopenia (HCC)    Tongue lesion    benign from trauma as of 06/20/21    Past Surgical History:  Procedure Laterality Date   CATARACT EXTRACTION, BILATERAL  2010   COLONOSCOPY WITH PROPOFOL N/A 04/15/2020   Procedure: COLONOSCOPY WITH PROPOFOL;  Surgeon: Robert Bellow, MD;  Location: [facility] ENDOSCOPY;  Service: Endoscopy;  Laterality: N/A;   CORONARY ARTERY BYPASS GRAFT  2000   LIMA to the LAD,RIMA to the PDA,vein graft to the diagonal & vein graft to the PLA   ESOPHAGOGASTRODUODENOSCOPY     Dr. Gustavo Lah   ESOPHAGOGASTRODUODENOSCOPY  04/15/2020   Procedure: ESOPHAGOGASTRODUODENOSCOPY (EGD);  Surgeon: Robert Bellow, MD;  Location: [facility] ENDOSCOPY;  Service: Endoscopy;;   HOLEP-LASER ENUCLEATION OF THE PROSTATE WITH MORCELLATION N/A 11/27/2021   Procedure: HOLEP-LASER ENUCLEATION OF THE PROSTATE WITH MORCELLATION;  Surgeon: Hollice Espy, MD;  Location: [facility] ORS;  Service: Urology;  Laterality: N/A;   INGUINAL HERNIA REPAIR Left 01/11/2022   Procedure: HERNIA REPAIR INGUINAL ADULT;  Surgeon: Olean Ree, MD;  Location: [facility] ORS;  Service: General;  Laterality: Left;   KNEE ARTHROSCOPY Right 1991   KNEE DEBRIDEMENT Right 1989   Fluid flush   PENILE PROSTHESIS IMPLANT     06/26/19 Dr. Francesca Jewett   SHOULDER ARTHROSCOPY Left 0814   [place] N/A 01/11/2022   Procedure: HERNIA REPAIR UMBILICAL ADULT;  Surgeon: Olean Ree, MD;  Location: [facility] ORS;  Service: General;  Laterality: N/A;   US ECHOCARDIOGRAPHY  12/12/2010   mild LA dilatation, normal LV systolic fx, EF > 48%   XI ROBOTIC ASSISTED INGUINAL HERNIA REPAIR WITH MESH Right 01/11/2022   Procedure: XI ROBOTIC ASSISTED INGUINAL HERNIA REPAIR WITH MESH;  Surgeon: Olean Ree, MD;  Location: [facility] ORS;  Service: General;  Laterality: Right;    Allergies  Allergen Reactions   Tamsulosin Other (See Comments)    Chest discomfort, basal dilation with a warm feeling in chest     Current Outpatient Medications   Medication Sig Dispense Refill   amLODipine (NORVASC) 10 MG tablet Take 1 tablet (10 mg total) by mouth daily. (  Patient taking differently: Take 10 mg by mouth every evening.) 90 tablet 3   atorvastatin (LIPITOR) 40 MG tablet Take 1 tablet (40 mg total) by mouth daily. At night (Patient taking differently: Take 40 mg by mouth every evening. At night) 90 tablet 3   benazepril (LOTENSIN) 40 MG tablet Take 1 tablet (40 mg total) by mouth daily. (Patient taking differently: Take 40 mg by mouth every evening.) 90 tablet 3   clopidogrel (PLAVIX) 75 MG tablet Take 1 tablet (75 mg total) by mouth daily. (Patient taking differently: Take 75 mg by mouth every evening.) 90 tablet 2   ezetimibe (ZETIA) 10 MG tablet Take 1 tablet (10 mg total) by mouth daily. (Patient taking differently: Take 10 mg by mouth every evening.) 90 tablet 3   ibuprofen (ADVIL) 200 MG tablet Take 400 mg by mouth every 8 (eight) hours as needed (pain.).     Multiple Vitamins-Minerals (MULTIVITAMIN WITH MINERALS) tablet Take 1 tablet by mouth every evening. Men 50+     NEEDLE, DISP, 18 G (B-D HYPODERMIC NEEDLE 18GX1.5") 18G X 1-1/2" MISC DRW UP MEDICATION WITH THIS NEEDLE 30 each 0   pantoprazole (PROTONIX) 40 MG tablet TAKE 1 TABLET BY MOUTH EVERY DAY 30 MINUTES BEFORE A MEAL (Patient taking differently: 40 mg every evening. TAKE 1 TABLET BY MOUTH EVERY DAY 30 MINUTES BEFORE A MEAL) 90 tablet 3   SYRINGE-NEEDLE, DISP, 3 ML 23G X 1-1/2" 3 ML MISC Use as directed QOWEEK 30 each 5   testosterone cypionate (DEPOTESTOSTERONE CYPIONATE) 200 MG/ML injection ADMINISTER 1.5 ML(300 MG) IN THE MUSCLE EVERY 14 DAYS 10 mL 0   No current facility-administered medications for this visit.    Social History   Socioeconomic History   Marital status: Married    Spouse name: Not on file   Number of children: 3   Years of education: 16   Highest education level: Not on file  Occupational History   Occupation: Retired Corporate treasurer   Occupation:  Management/Technical Support    Comment: Psychiatric nurse  Tobacco Use   Smoking status: Every Day    Packs/day: 0.50    Years: 44.00    Total pack years: 22.00    Types: Cigarettes   Smokeless tobacco: Never   Tobacco comments:    on and off smoker   Vaping Use   Vaping Use: Never used  Substance and Sexual Activity   Alcohol use: Yes    Alcohol/week: 10.0 standard drinks of alcohol    Types: 10 Cans of beer per week    Comment: Down to 3 beers a day from 4-6 a day    Drug use: No   Sexual activity: Yes    Partners: Female    Comment: Wife  Other Topics Concern   Not on file  Social History Narrative   Ormond grew up in [place]. He is currently living in [place] with his wife. This is his second marriage. He has 1 daughter and 2 sons from his first marriage. He has 4 step kids. His daughter lives in [place], one son in [place], [place] and the other son in [place], [place]. 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    He enjoys wood working on his spare time. 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  He retired 04/2019 and enjoys this       2 brothers and 2 sisters he is next to youngest    Social Determinants of Radio broadcast assistant Strain: Not on file  Food Insecurity: Not on file  Transportation Needs: Not on file  Physical Activity: Not on file  Stress: Not on file  Social Connections: Not on file  Intimate Partner Violence: Not on file    Family History  Problem Relation Age of Onset   Arthritis Mother    Heart disease Father        CAD - MI   Stroke Father    Hypertension Father    Cancer Father 57       esophageal cancer    Arthritis Maternal Grandmother     ROS General: Negative; No fevers, chills, or night sweats;  HEENT: Negative; No changes in vision or hearing, sinus congestion,  difficulty swallowing Pulmonary: Negative; No cough, wheezing, shortness of breath, hemoptysis Cardiovascular: See history of present illness GI: Negative; No nausea, vomiting, diarrhea, or abdominal pain GU: Low testosterone no dysuria, hematuria, or difficulty voiding Musculoskeletal: Negative; no myalgias, joint pain, or weakness Hematologic/Oncology: History of erythrocytosis and thrombocytopenia. Endocrine: Negative; no heat/cold intolerance; no diabetes Neuro: Negative; no changes in balance, headaches Skin: Negative; No rashes or skin lesions Psychiatric: Negative; No behavioral problems, depression Sleep: Positive for OSA on CPAP therapy.  No snoring, daytime sleepiness, hypersomnolence, bruxism, restless legs, hypnogognic hallucinations, no cataplexy Other comprehensive 14 point system review is negative.  PE BP 130/60   Pulse (!) 55   Ht 5' 11" (1.803 m)   Wt 212 lb 6.4 oz (96.3 kg)   SpO2 100%   BMI 29.62 kg/m    Repeat blood pressure 124/70  Wt Readings from Last 3 Encounters:  05/09/22 212 lb 6.4 oz (96.3 kg)  04/11/22 205 lb (93 kg)  02/14/22 212 lb (96.2 kg)   General: Alert, oriented, no distress.  Skin: normal turgor, no rashes, warm and dry HEENT: Normocephalic, atraumatic. Pupils equal round and reactive to light; sclera anicteric; extraocular muscles intact;  Nose without nasal septal hypertrophy Mouth/Parynx benign; Mallinpatti scale 3 Neck: No JVD, no carotid bruits; normal carotid upstroke Lungs: clear to ausculatation and percussion; no wheezing or rales Chest wall: without tenderness to palpitation Heart: PMI not displaced, RRR, s1 s2 normal, 1/6 systolic murmur, no diastolic murmur, no rubs, gallops, thrills, or heaves Abdomen: soft, nontender; no hepatosplenomehaly, BS+; abdominal aorta nontender and not dilated by palpation. Back: no CVA tenderness Pulses 2+ Musculoskeletal: full range of motion, normal strength, no joint deformities Extremities:  no clubbing cyanosis or edema, Homan's sign negative  Neurologic: grossly nonfocal; Cranial nerves grossly wnl Psychologic: Normal mood and affect   May 09, 2022 ECG (independently read by me): Sinus bradycardia at 55, 1st degree AV block, PR 248 msec  November 20, 2021 ECG (independently read by me): Sinus bradycardia at 56, 1st degree AV block; PR 246 msec  September 04, 2021 ECG (independently read by me): Sinus bradycardia 55, PR 210 msec, inferior T wave changes and Q wave III, aVF,   January 09, 2021 ECG (independently read by me): Sinus bradycardia at 53; 1st degree block, PR 222 msec, borderline LVH  February 2021 ECG (independently read by me): Sinus bradycardia at 58 bpm, first-degree AV block with a PR interval at 244 ms.  No significant ST-T changes.  QTc interval 431 ms  December 2019 ECG (independently read by me): Sinus bradycardia  at 56 bpm.  First-degree block with PR interval 264 ms.  QTc interval 443 ms  October 2017 ECG (independently read by me): Sinus bradycardia at 44 bpm with first degree AV block.  Q wave in leads III and F.  PR interval 224 ms.  June 2016 ECG (independently read by me): not done today due to recent treadmill nuclear study  ECG (independently read by me): Sinus bradycardia at 45 beats per minute. Normal intervals. Q waves in lead 3 and small Q wave in aVF.  LABS:    Latest Ref Rng & Units 02/14/2022    1:30 PM 01/03/2022    3:36 PM 01/03/2022   11:11 AM  BMP  Glucose 70 - 99 mg/dL 127   100   BUN 8 - 23 mg/dL 22   18   Creatinine 0.61 - 1.24 mg/dL 1.27  1.30  1.07   Sodium 135 - 145 mmol/L 132   136   Potassium 3.5 - 5.1 mmol/L 3.9   4.4   Chloride 98 - 111 mmol/L 101   103   CO2 22 - 32 mmol/L 23   26   Calcium 8.9 - 10.3 mg/dL 8.8   9.5       Latest Ref Rng & Units 02/14/2022    1:30 PM 01/03/2022   11:11 AM 11/28/2021    7:28 PM  Hepatic Function  Total Protein 6.5 - 8.1 g/dL 7.2  6.7  6.9   Albumin 3.5 - 5.0 g/dL 4.4  4.5  4.0   AST  15 - 41 U/L _0 ALT 0 - 44 U/L _1 Alk Phosphatase 38 - 126 U/L 77  91  67   Total Bilirubin 0.3 - 1.2 mg/dL 0.9  0.9  0.8       Latest Ref Rng & Units 04/16/2022    2:46 PM 04/05/2022    9:44 AM 02/14/2022    1:30 PM  CBC  WBC 4.0 - 10.5 K/uL   9.5   Hemoglobin 13.0 - 17.7 g/dL  16.9  17.2   Hematocrit 37.5 - 51.0 % 46.9  50.4  51.4   Platelets 150 - 400 K/uL   132    Lab Results  Component Value Date   MCV 92.6 02/14/2022   MCV 93.5 01/03/2022   MCV 92.3 12/01/2021   Lab Results  Component Value Date   TSH 1.50 01/03/2022   Lab Results  Component Value Date   HGBA1C 6.3 01/03/2022    Lipid Panel     Component Value Date/Time   CHOL 123 06/20/2021 1030   CHOL 167 12/17/2019 1113   TRIG 77.0 06/20/2021 1030   HDL 57.50 06/20/2021 1030   HDL 86 12/17/2019 1113   CHOLHDL 2 06/20/2021 1030   VLDL 15.4 06/20/2021 1030   LDLCALC 50 06/20/2021 1030   LDLCALC 65 12/17/2019 1113   LDLDIRECT 91.6 01/06/2014 1443    RADIOLOGY: No results found.  IMPRESSION: No diagnosis found.   ASSESSMENT AND PLAN: Christian. Avelino Herren is a 71  year-old gentleman who has a history of CAD and underwent initial intervention in 1998. Due to progressive CAD he underwent CABG surgery in 2000. A nuclear perfusion study in 2013 remained normal without scar or ischemia.  A three-year follow-up nuclear perfusion study on 04/26/2015 continued to show entirely normal perfusion without scar or ischemia.  However, he developed asymptomatic ST depression during stress.  For this reason,  it was interpreted as intermediate study.  Post-rest ejection fraction was 52%.  A repeat Myoview in July 2020 EF 55 to 60% was without ischemia or scar.  He underwent successful penile implant surgery without cardiovascular compromise.  Unfortunately he continued to smoke cigarettes and developed atypical chest pain symptomatology at his last evaluation.  A subsequent Lexiscan Myoview study on October 13, 2021 was unchanged and remained low risk without ischemia.  At his last cardiology visit with me in January 2023, he was given cardiology clearance and underwent successful bilateral inguinal hernia surgery as well as umbilical surgery on January 11, 2022 without cardiovascular compromise.  Presently, he feels well.  He denies any anginal symptoms or shortness of breath.  He has a history of erythrocytosis and thrombocytopenia.  He undergoes periodic phlebotomy secondary to his increased hemoglobin and had been on long-term testosterone injections.  He continues to use CPAP with 100% compliance.  He does not have a CPAP auto unit and is on a fixed pressure of 16 cm.  I am adding EPR of 3 to see if this improves his events which currently are 8.2/h.  He uses a ResMed F20 fullface mask.  His CPAP set up date was March 2019.  After 5 years he will qualify for a new machine which should be in AirSense 11 CPAP auto mode.  I will see him in March 2024 for reevaluation.   Troy Sine, MD, [facility]  05/09/2022 10:52 AM

## 2022-05-16 ENCOUNTER — Encounter: Payer: Self-pay | Admitting: Cardiovascular Disease

## 2022-05-24 IMAGING — CT CT CHEST LUNG CANCER SCREENING LOW DOSE W/O CM
2 of 5 series · 15 of 40 positions shown, 18 images · non-contrast
Comparison: 01/12/2020.

CLINICAL DATA: 69-year-old male with 46 pack-year history of
smoking. Lung cancer screening.

EXAM:
CT CHEST WITHOUT CONTRAST LOW-DOSE FOR LUNG CANCER SCREENING
TECHNIQUE: Multidetector CT imaging of the chest was performed following the
standard protocol without IV contrast.

[Series 3: lung 1.00 · axial · 0.70mm/px · z∈[-1220,-902]mm · 12 of 352 slices shown, 15 images]
[im 17/352  mediastinal]
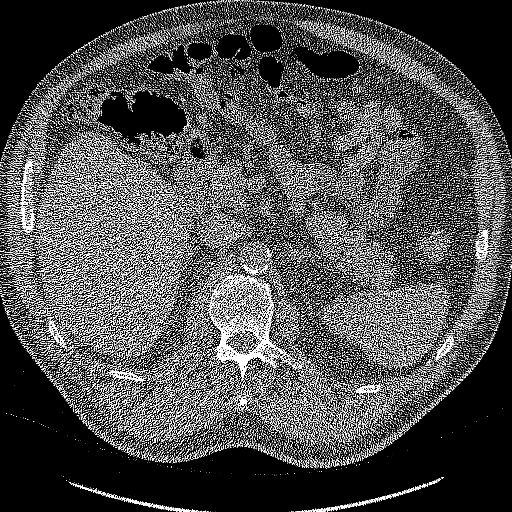
[im 17/352  lung]
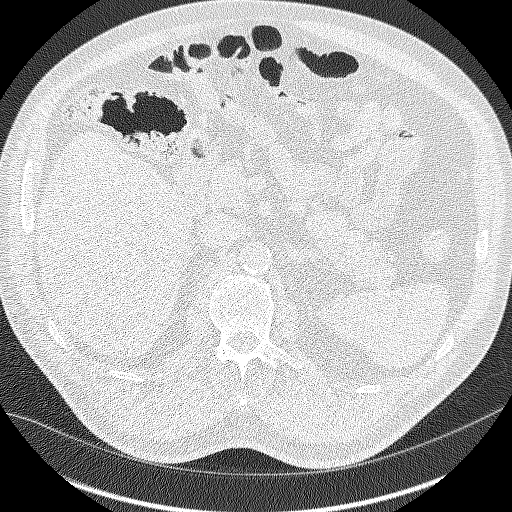
[im 51/352  lung]
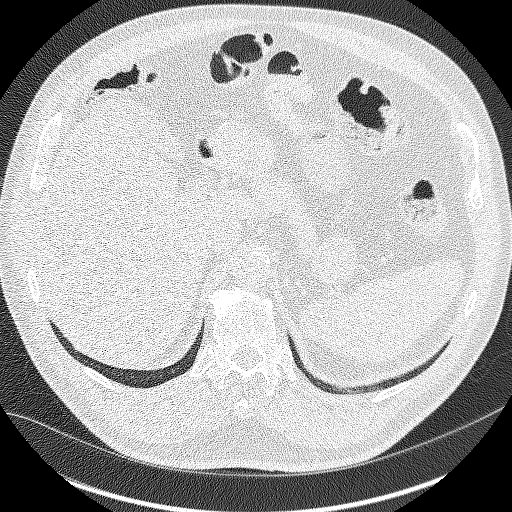
[im 84/352  lung]
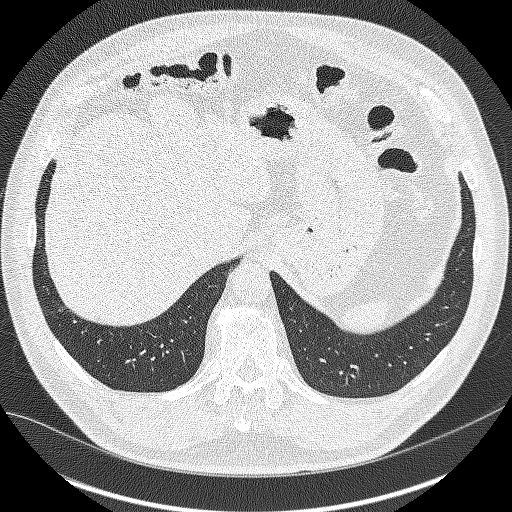
[im 101/352  lung]
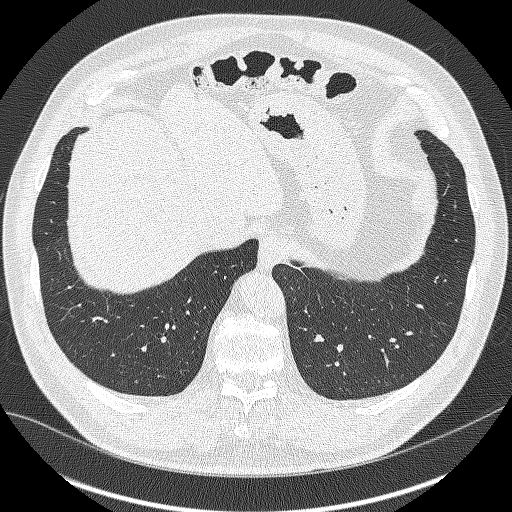
[im 134/352  mediastinal]
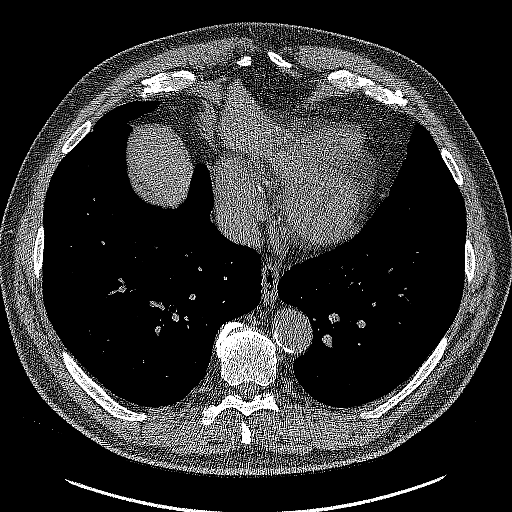
[im 134/352  lung]
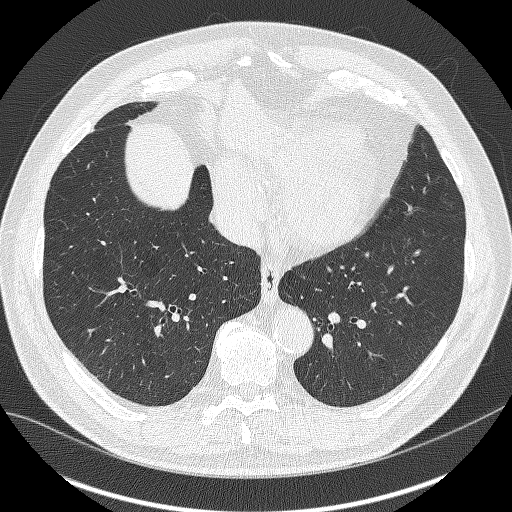
[im 168/352  lung]
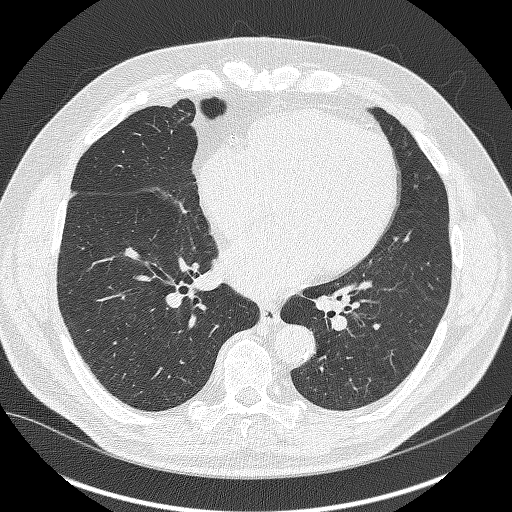
[im 184/352  lung]
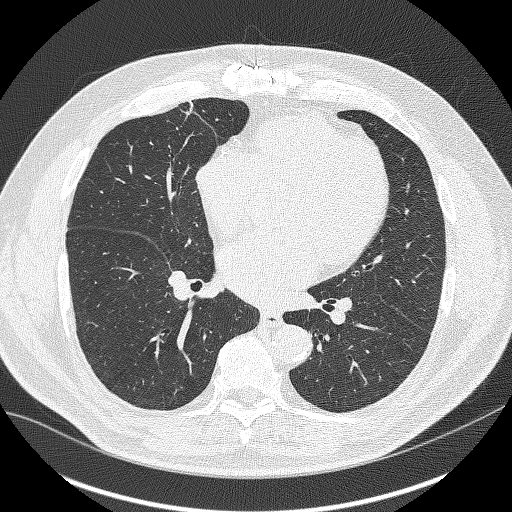
[im 218/352  lung]
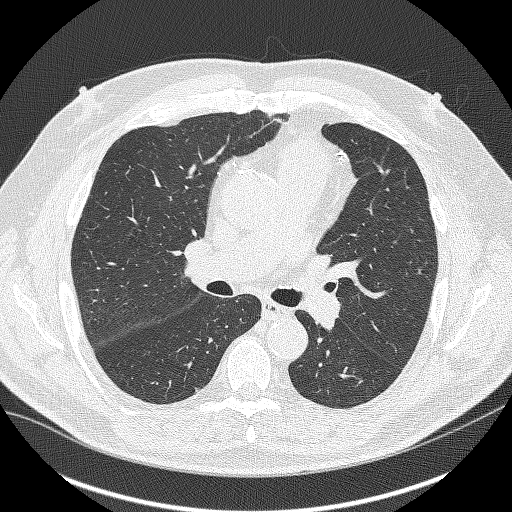
[im 251/352  mediastinal]
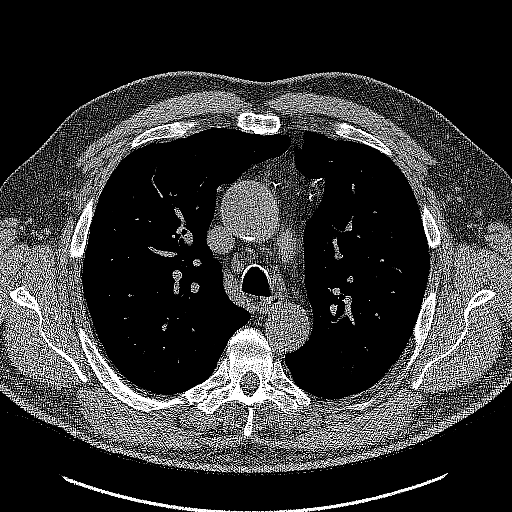
[im 251/352  lung]
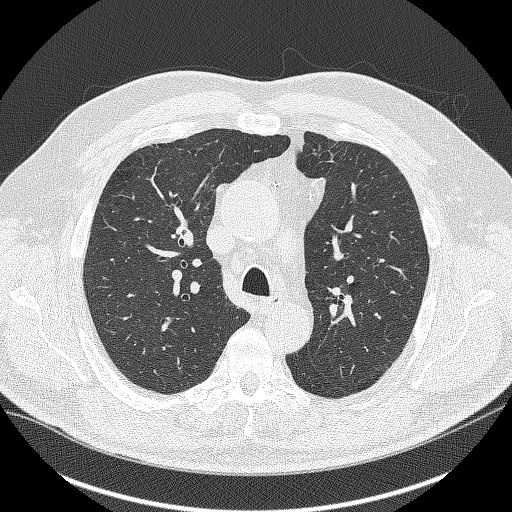
[im 268/352  lung]
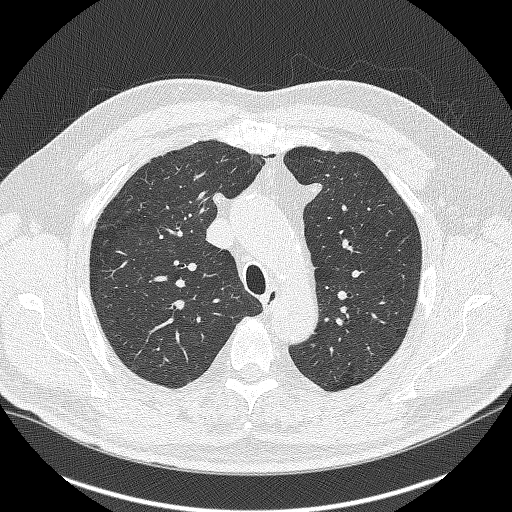
[im 301/352  lung]
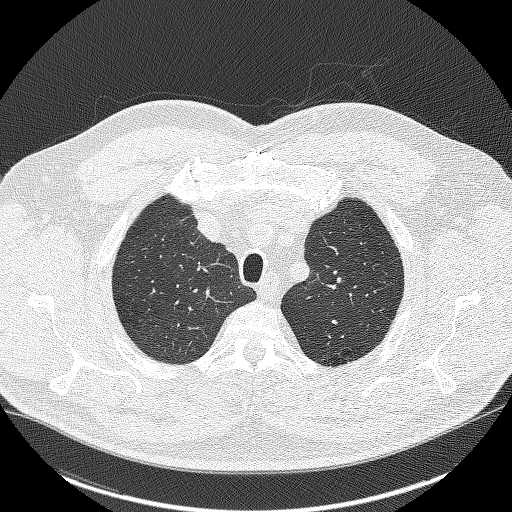
[im 335/352  lung]
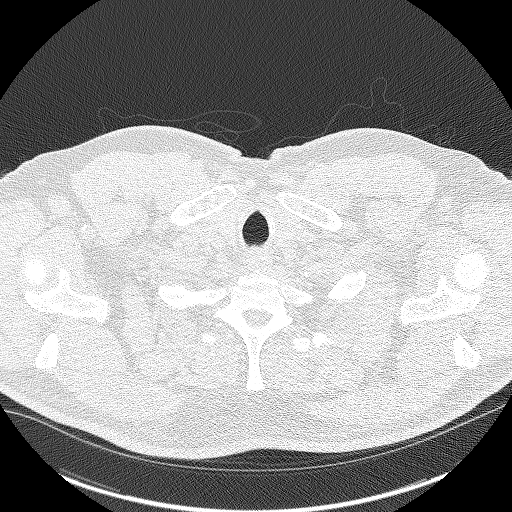

[Series 4: coronals lung 1.00 cor · coronal · 0.69mm/px · 3 of 327 slices shown]
[im 66/327  lung]
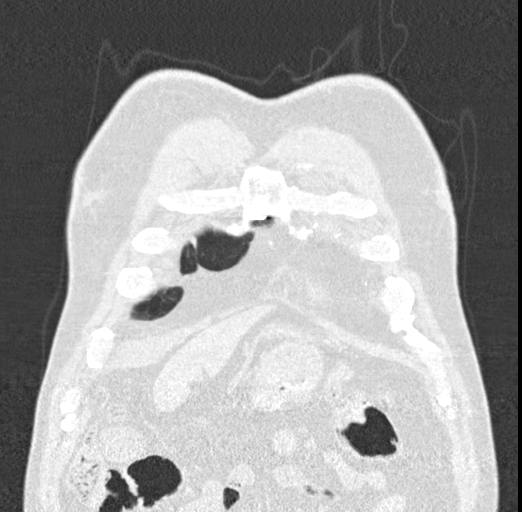
[im 131/327  lung]
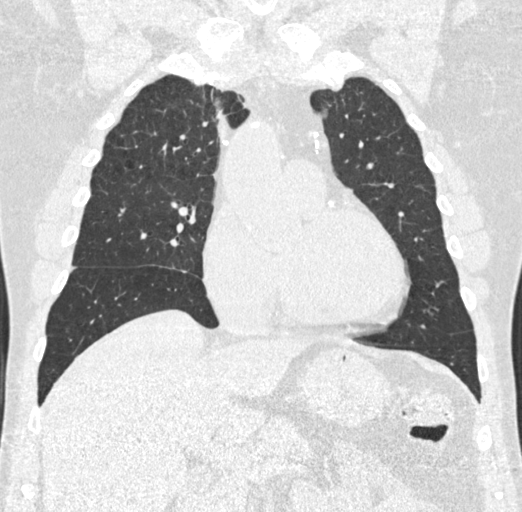
[im 196/327  lung]
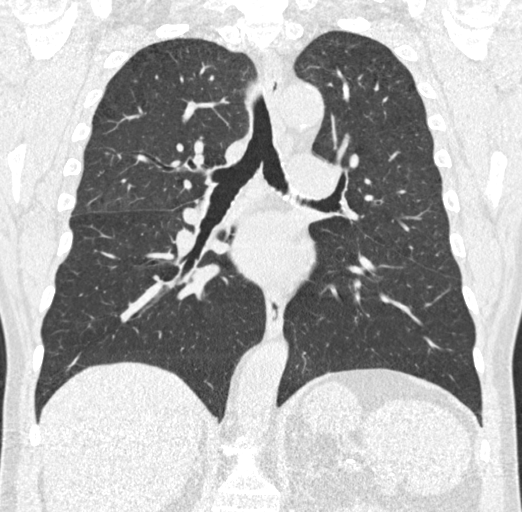

[15 of 40 positions shown; findings below may reference images not displayed]

FINDINGS: Cardiovascular: The heart size is normal. No substantial pericardial
effusion. Coronary artery calcification is evident. Atherosclerotic
calcification is noted in the wall of the thoracic aorta. Ascending
thoracic aorta measures 4.3 cm diameter.

Mediastinum/Nodes: No mediastinal lymphadenopathy. No evidence for
gross hilar lymphadenopathy although assessment is limited by the
lack of intravenous contrast on today's study. The esophagus has
normal imaging features. There is no axillary lymphadenopathy.

Lungs/Pleura: Centrilobular and paraseptal emphysema evident.
Previously identified tiny pulmonary nodules are stable. No new
suspicious nodule or mass. No focal airspace consolidation. No
pleural effusion.

Upper Abdomen: Unremarkable.

Musculoskeletal: No worrisome lytic or sclerotic osseous
abnormality.
IMPRESSION: 1. Lung-RADS 2, benign appearance or behavior. Continue annual
screening with low-dose chest CT without contrast in 12 months.
2. Stable 4.3 cm ascending thoracic aortic diameter. Recommend
attention on follow-up annual screening chest CT. Aortic aneurysm
NOS (RE0RZ-UUC.0)
3.  Emphysema (RE0RZ-LH2.G) and Aortic Atherosclerosis (RE0RZ-170.0)

## 2022-05-30 ENCOUNTER — Encounter: Payer: Self-pay | Admitting: Cardiovascular Disease

## 2022-06-04 ENCOUNTER — Ambulatory Visit: Payer: TRICARE For Life (TFL)

## 2022-06-23 ENCOUNTER — Other Ambulatory Visit: Payer: Self-pay | Admitting: Internal Medicine

## 2022-06-23 DIAGNOSIS — E785 Hyperlipidemia, unspecified: Secondary | ICD-10-CM

## 2022-07-17 ENCOUNTER — Ambulatory Visit: Payer: Medicare Other

## 2022-07-19 ENCOUNTER — Ambulatory Visit
Admission: RE | Admit: 2022-07-19 | Discharge: 2022-07-19 | Disposition: A | Discharge status: Home / Self Care | Payer: Medicare Other | Source: Ambulatory Visit | Attending: Urology | Admitting: Urology

## 2022-07-19 DIAGNOSIS — N281 Cyst of kidney, acquired: Secondary | ICD-10-CM | POA: Insufficient documentation

## 2022-07-19 DIAGNOSIS — N289 Disorder of kidney and ureter, unspecified: Secondary | ICD-10-CM | POA: Diagnosis not present

## 2022-07-22 ENCOUNTER — Other Ambulatory Visit: Payer: Self-pay | Admitting: Urology

## 2022-07-22 DIAGNOSIS — N281 Cyst of kidney, acquired: Secondary | ICD-10-CM

## 2022-07-23 ENCOUNTER — Other Ambulatory Visit: Payer: Self-pay | Admitting: Internal Medicine

## 2022-07-23 ENCOUNTER — Encounter: Payer: Self-pay | Admitting: *Deleted

## 2022-07-23 DIAGNOSIS — R7989 Other specified abnormal findings of blood chemistry: Secondary | ICD-10-CM

## 2022-07-24 ENCOUNTER — Other Ambulatory Visit: Payer: Self-pay | Admitting: *Deleted

## 2022-07-25 MED ORDER — TESTOSTERONE CYPIONATE 200 MG/ML IM SOLN: INTRAMUSCULAR | 0 refills | Status: DC

## 2022-08-03 ENCOUNTER — Encounter: Payer: Self-pay | Admitting: Internal Medicine

## 2022-08-03 ENCOUNTER — Ambulatory Visit (INDEPENDENT_AMBULATORY_CARE_PROVIDER_SITE_OTHER): Payer: Medicare Other | Admitting: Internal Medicine

## 2022-08-03 ENCOUNTER — Other Ambulatory Visit (INDEPENDENT_AMBULATORY_CARE_PROVIDER_SITE_OTHER): Payer: Medicare Other

## 2022-08-03 ENCOUNTER — Other Ambulatory Visit: Payer: Self-pay | Admitting: Urology

## 2022-08-03 VITALS — BP 136/70 | HR 58 | Temp 97.8°F | Wt 216.6 lb

## 2022-08-03 DIAGNOSIS — R7303 Prediabetes: Secondary | ICD-10-CM | POA: Diagnosis not present

## 2022-08-03 DIAGNOSIS — E785 Hyperlipidemia, unspecified: Secondary | ICD-10-CM

## 2022-08-03 DIAGNOSIS — Z951 Presence of aortocoronary bypass graft: Secondary | ICD-10-CM

## 2022-08-03 DIAGNOSIS — I10 Essential (primary) hypertension: Secondary | ICD-10-CM | POA: Diagnosis not present

## 2022-08-03 DIAGNOSIS — K219 Gastro-esophageal reflux disease without esophagitis: Secondary | ICD-10-CM

## 2022-08-03 DIAGNOSIS — Z23 Encounter for immunization: Secondary | ICD-10-CM

## 2022-08-03 DIAGNOSIS — I2510 Atherosclerotic heart disease of native coronary artery without angina pectoris: Secondary | ICD-10-CM | POA: Diagnosis not present

## 2022-08-03 LAB — CBC WITH DIFFERENTIAL/PLATELET
Basophils Absolute: 0 10*3/uL (ref 0.0–0.1)
Basophils Relative: 0.4 % (ref 0.0–3.0)
Eosinophils Absolute: 0.1 10*3/uL (ref 0.0–0.7)
Eosinophils Relative: 1.6 % (ref 0.0–5.0)
HCT: 50.6 % (ref 39.0–52.0)
Hemoglobin: 16.8 g/dL (ref 13.0–17.0)
Lymphocytes Relative: 41 % (ref 12.0–46.0)
Lymphs Abs: 2.8 10*3/uL (ref 0.7–4.0)
MCHC: 33.2 g/dL (ref 30.0–36.0)
MCV: 94.5 fl (ref 78.0–100.0)
Monocytes Absolute: 0.8 10*3/uL (ref 0.1–1.0)
Monocytes Relative: 12.4 % — ABNORMAL HIGH (ref 3.0–12.0)
Neutro Abs: 3 10*3/uL (ref 1.4–7.7)
Neutrophils Relative %: 44.6 % (ref 43.0–77.0)
Platelets: 116 10*3/uL — ABNORMAL LOW (ref 150.0–400.0)
RBC: 5.36 Mil/uL (ref 4.22–5.81)
RDW: 16.8 % — ABNORMAL HIGH (ref 11.5–15.5)
WBC: 6.8 10*3/uL (ref 4.0–10.5)

## 2022-08-03 LAB — LIPID PANEL
Cholesterol: 130 mg/dL (ref 0–200)
HDL: 59.6 mg/dL (ref >39.00)
LDL Cholesterol: 52 mg/dL (ref 0–99)
NonHDL: 70.46
Total CHOL/HDL Ratio: 2
Triglycerides: 94 mg/dL (ref 0.0–149.0)
VLDL: 18.8 mg/dL (ref 0.0–40.0)

## 2022-08-03 LAB — COMPREHENSIVE METABOLIC PANEL
ALT: 26 U/L (ref 0–53)
AST: 28 U/L (ref 0–37)
Albumin: 4.4 g/dL (ref 3.5–5.2)
Alkaline Phosphatase: 81 U/L (ref 39–117)
BUN: 17 mg/dL (ref 6–23)
CO2: 25 mEq/L (ref 19–32)
Calcium: 9.3 mg/dL (ref 8.4–10.5)
Chloride: 104 mEq/L (ref 96–112)
Creatinine, Ser: 1.19 mg/dL (ref 0.40–1.50)
GFR: 61.04 mL/min (ref >60.00)
Glucose, Bld: 114 mg/dL — ABNORMAL HIGH (ref 70–99)
Potassium: 4.6 mEq/L (ref 3.5–5.1)
Sodium: 139 mEq/L (ref 135–145)
Total Bilirubin: 0.9 mg/dL (ref 0.2–1.2)
Total Protein: 6.9 g/dL (ref 6.0–8.3)

## 2022-08-03 MED ORDER — EZETIMIBE 10 MG PO TABS: ORAL_TABLET | ORAL | 3 refills | Status: DC

## 2022-08-03 MED ORDER — "BD HYPODERMIC NEEDLE 18G X 1-1/2"" MISC": 3 refills | Status: DC

## 2022-08-03 MED ORDER — CLOPIDOGREL BISULFATE 75 MG PO TABS: 75.0000 mg | ORAL_TABLET | Freq: Every evening | ORAL | 3 refills | Status: DC

## 2022-08-03 MED ORDER — SYRINGE/NEEDLE (DISP) 23G X 1" 3 ML MISC
1.5000 mg | 3 refills | Status: DC
Start: 2022-08-03 — End: 2023-03-26

## 2022-08-03 MED ORDER — SHINGRIX 50 MCG/0.5ML IM SUSR: 0.5000 mL | Freq: Once | INTRAMUSCULAR | 1 refills | Status: AC

## 2022-08-03 MED ORDER — PANTOPRAZOLE SODIUM 40 MG PO TBEC: 40.0000 mg | DELAYED_RELEASE_TABLET | Freq: Every evening | ORAL | 3 refills | Status: DC

## 2022-08-03 MED ORDER — AMLODIPINE BESYLATE 10 MG PO TABS: 10.0000 mg | ORAL_TABLET | Freq: Every evening | ORAL | 3 refills | Status: DC

## 2022-08-03 MED ORDER — TETANUS-DIPHTH-ACELL PERTUSSIS 5-2.5-18.5 LF-MCG/0.5 IM SUSP: 0.5000 mL | Freq: Once | INTRAMUSCULAR | 0 refills | Status: AC

## 2022-08-03 MED ORDER — BENAZEPRIL HCL 40 MG PO TABS: 40.0000 mg | ORAL_TABLET | Freq: Every evening | ORAL | 3 refills | Status: DC

## 2022-08-03 MED ORDER — ATORVASTATIN CALCIUM 40 MG PO TABS: ORAL_TABLET | ORAL | 3 refills | Status: DC

## 2022-08-03 NOTE — Patient Instructions (Addendum)
04/2025 due colonoscopy   Pneumococcal Conjugate Vaccine (Prevnar 20) Suspension for Injection What is this medication? PNEUMOCOCCAL VACCINE (NEU mo KOK al vak SEEN) is a vaccine. It prevents pneumococcus bacterial infections. These bacteria can cause serious infections like pneumonia, meningitis, and blood infections. This vaccine will not treat an infection and will not cause infection. This vaccine is recommended for adults 18 years and older. This medicine may be used for other purposes; ask your health care provider or pharmacist if you have questions. COMMON BRAND NAME(S): Prevnar 20 What should I tell my care team before I take this medication? They need to know if you have any of these conditions: bleeding disorder fever immune system problems an unusual or allergic reaction to pneumococcal vaccine, diphtheria toxoid, other vaccines, other medicines, foods, dyes, or preservatives pregnant or trying to get pregnant breast-feeding How should I use this medication? This vaccine is injected into a muscle. It is given by a health care provider. A copy of Vaccine Information Statements will be given before each vaccination. Be sure to read this information carefully each time. This sheet may change often. Talk to your health care provider about the use of this medicine in children. Special care may be needed. Overdosage: If you think you have taken too much of this medicine contact a poison control center or emergency room at once. NOTE: This medicine is only for you. Do not share this medicine with others. What if I miss a dose? This does not apply. This medicine is not for regular use. What may interact with this medication? medicines for cancer chemotherapy medicines that suppress your immune function steroid medicines like prednisone or cortisone This list may not describe all possible interactions. Give your health care provider a list of all the medicines, herbs, non-prescription  drugs, or dietary supplements you use. Also tell them if you smoke, drink alcohol, or use illegal drugs. Some items may interact with your medicine. What should I watch for while using this medication? Mild fever and pain should go away in 3 days or less. Report any unusual symptoms to your health care provider. What side effects may I notice from receiving this medication? Side effects that you should report to your doctor or health care professional as soon as possible: allergic reactions (skin rash, itching or hives; swelling of the face, lips, or tongue) confusion fast, irregular heartbeat fever over 102 degrees F muscle weakness seizures trouble breathing unusual bruising or bleeding Side effects that usually do not require medical attention (report to your doctor or health care professional if they continue or are bothersome): fever of 102 degrees F or less headache joint pain muscle cramps, pain pain, tender at site where injected This list may not describe all possible side effects. Call your doctor for medical advice about side effects. You may report side effects to FDA at 1-800-FDA-1088. Where should I keep my medication? This vaccine is only given by a health care provider. It will not be stored at home. NOTE: This sheet is a summary. It may not cover all possible information. If you have questions about this medicine, talk to your doctor, pharmacist, or health care provider.  2023 Elsevier/Gold Standard (2020-07-01 00:00:00)

## 2022-08-03 NOTE — Progress Notes (Signed)
Chief Complaint  Patient presents with   Follow-up    7 month f/u   Fu Htn sl elevated for age ok refilled meds norvasc 10, lotensin 101, zetia 10 and lipitor 40  He f/u cardiology  Refilled chronic meds     Review of Systems  Constitutional:  Negative for weight loss.  HENT:  Negative for hearing loss.   Eyes:  Negative for blurred vision.  Respiratory:  Negative for shortness of breath.   Cardiovascular:  Negative for chest pain.  Gastrointestinal:  Negative for abdominal pain and blood in stool.  Genitourinary:  Negative for dysuria.  Musculoskeletal:  Negative for back pain, falls and joint pain.  Skin:  Negative for rash.  Neurological:  Negative for headaches.  Psychiatric/Behavioral:  Negative for depression.    Past Medical History:  Diagnosis Date   Aortic atherosclerosis (HCC)    BPH (benign prostatic hyperplasia)    CAD (coronary artery disease)    a. s/p CABG in 2000 with LIMA-LAD, RIMA-PDA, SVG-D1, and SVG-PLA b. low-risk NST in 2016   Carotid artery stenosis    Cyst of right kidney    ED (erectile dysfunction)    a.) penile implant in place   Fatty liver    GERD (gastroesophageal reflux disease)    Hyperlipidemia    Hypertension    Long term current use of antithrombotics/antiplatelets    a.) clopidogrel   Low testosterone    a.) on exogenous TRT   OSA on CPAP    Pre-diabetes    S/P CABG x 4 2000   a.) LIMA-LAD, RIMA-PDA, SVG-D1, SVG-PLA   Secondary erythrocytosis    a.) smoking + TRT + OSAH   Sinus bradycardia    Skin cancer    Thrombocytopenia (HCC)    Tongue lesion    benign from trauma as of 06/20/21   Past Surgical History:  Procedure Laterality Date   CATARACT EXTRACTION, BILATERAL  2010   COLONOSCOPY WITH PROPOFOL N/A 04/15/2020   Procedure: COLONOSCOPY WITH PROPOFOL;  Surgeon: Robert Bellow, MD;  Location: [facility] ENDOSCOPY;  Service: Endoscopy;  Laterality: N/A;   CORONARY ARTERY BYPASS GRAFT  2000   LIMA to the LAD,RIMA to the  PDA,vein graft to the diagonal & vein graft to the PLA   ESOPHAGOGASTRODUODENOSCOPY     Dr. Gustavo Lah   ESOPHAGOGASTRODUODENOSCOPY  04/15/2020   Procedure: ESOPHAGOGASTRODUODENOSCOPY (EGD);  Surgeon: Robert Bellow, MD;  Location: [facility] ENDOSCOPY;  Service: Endoscopy;;   HOLEP-LASER ENUCLEATION OF THE PROSTATE WITH MORCELLATION N/A 11/27/2021   Procedure: HOLEP-LASER ENUCLEATION OF THE PROSTATE WITH MORCELLATION;  Surgeon: Hollice Espy, MD;  Location: [facility] ORS;  Service: Urology;  Laterality: N/A;   INGUINAL HERNIA REPAIR Left 01/11/2022   Procedure: HERNIA REPAIR INGUINAL ADULT;  Surgeon: Olean Ree, MD;  Location: [facility] ORS;  Service: General;  Laterality: Left;   KNEE ARTHROSCOPY Right 1991   KNEE DEBRIDEMENT Right 1989   Fluid flush   PENILE PROSTHESIS IMPLANT     06/26/19 Dr. Francesca Jewett   SHOULDER ARTHROSCOPY Left [id number]   [place] N/A 01/11/2022   Procedure: HERNIA REPAIR UMBILICAL ADULT;  Surgeon: Olean Ree, MD;  Location: [facility] ORS;  Service: General;  Laterality: N/A;   US ECHOCARDIOGRAPHY  12/12/2010   mild LA dilatation, normal LV systolic fx, EF > 19%   XI ROBOTIC ASSISTED INGUINAL HERNIA REPAIR WITH MESH Right 01/11/2022   Procedure: XI ROBOTIC ASSISTED INGUINAL HERNIA REPAIR WITH MESH;  Surgeon: Olean Ree, MD;  Location: [facility] ORS;  Service: General;  Laterality: Right;   Family History  Problem Relation Age of Onset   Arthritis Mother    Heart disease Father        CAD - MI   Stroke Father    Hypertension Father    Cancer Father 68       esophageal cancer    Arthritis Maternal Grandmother    Social History   Socioeconomic History   Marital status: Married    Spouse name: Not on file   Number of children: 3   Years of education: 16   Highest education level: Not on file  Occupational History   Occupation: Retired Corporate treasurer   Occupation: Management/Technical Support    Comment: Psychiatric nurse  Tobacco Use   Smoking status: Every Day    Packs/day:  0.50    Years: 44.00    Total pack years: 22.00    Types: Cigarettes   Smokeless tobacco: Never   Tobacco comments:    on and off smoker   Vaping Use   Vaping Use: Never used  Substance and Sexual Activity   Alcohol use: Yes    Alcohol/week: 10.0 standard drinks of alcohol    Types: 10 Cans of beer per week    Comment: Down to 3 beers a day from 4-6 a day    Drug use: No   Sexual activity: Yes    Partners: Female    Comment: Wife  Other Topics Concern   Not on file  Social History Narrative   Olon grew up in [place]. He is currently living in [place] with his wife. This is his second marriage. He has 1 daughter and 2 sons from his first marriage. He has 4 step kids. His daughter lives in [place], one son in [place], [place] and the other son in [place], [place]. 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    He enjoys wood working on his spare time. 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       He retired 04/2019 and enjoys this       2 brothers and 2 sisters he is next to youngest    Social Determinants of Radio broadcast assistant Strain: Not on file  Food Insecurity: Not on file  Transportation Needs: Not on file  Physical Activity: Not on file  Stress: Not on file  Social Connections: Not on file  Intimate Partner Violence: Not on file   Current Meds  Medication Sig   ibuprofen (ADVIL) 200 MG tablet Take 400 mg by mouth every 8 (eight) hours as needed (pain.).   Multiple Vitamins-Minerals (MULTIVITAMIN WITH MINERALS) tablet Take 1 tablet by mouth every evening. Men 50+   SYRINGE-NEEDLE, DISP, 3 ML 23G X 1" 3 ML MISC 1.5 mg by Does not apply route every 14 (fourteen) days.   Tdap (BOOSTRIX) 5-2.5-18.5 LF-MCG/0.5 injection Inject 0.5 mLs into the muscle once for 1 dose.   testosterone cypionate  (DEPOTESTOSTERONE CYPIONATE) 200 MG/ML injection ADMINISTER 1.5 ML(300 MG) IN THE MUSCLE EVERY 14 DAYS   Zoster Vaccine Adjuvanted [facility]) injection Inject 0.5 mLs into the muscle once for 1 dose.   [DISCONTINUED] amLODipine (NORVASC) 10 MG tablet Take 1 tablet (10 mg total) by mouth daily. (Patient taking differently: Take 10 mg by mouth every evening.)   [DISCONTINUED]  atorvastatin (LIPITOR) 40 MG tablet TAKE 1 TABLET(40 MG) BY MOUTH DAILY AT NIGHT   [DISCONTINUED] clopidogrel (PLAVIX) 75 MG tablet Take 1 tablet (75 mg total) by mouth daily. (Patient taking differently: Take 75 mg by mouth every evening.)   [DISCONTINUED] ezetimibe (ZETIA) 10 MG tablet TAKE 1 TABLET(10 MG) BY MOUTH DAILY   [DISCONTINUED] NEEDLE, DISP, 18 G (B-D HYPODERMIC NEEDLE 18GX1.5") 18G X 1-1/2" MISC DRW UP MEDICATION WITH THIS NEEDLE   [DISCONTINUED] pantoprazole (PROTONIX) 40 MG tablet TAKE 1 TABLET BY MOUTH EVERY DAY 30 MINUTES BEFORE A MEAL (Patient taking differently: 40 mg every evening. TAKE 1 TABLET BY MOUTH EVERY DAY 30 MINUTES BEFORE A MEAL)   [DISCONTINUED] SYRINGE-NEEDLE, DISP, 3 ML (B-D 3CC LUER-LOK SYR 23GX1-1/2) 23G X 1-1/2" 3 ML MISC USE 1 SYRINGE AS DIRECTED EVERY OTHER WEEK( EVERY 14 DAYS)   Allergies  Allergen Reactions   Tamsulosin Other (See Comments)    Chest discomfort, basal dilation with a warm feeling in chest    No results found for this or any previous visit (from the past 2160 hour(s)). Objective  Body mass index is 30.21 kg/m. Wt Readings from Last 3 Encounters:  08/03/22 216 lb 9.6 oz (98.2 kg)  05/09/22 212 lb 6.4 oz (96.3 kg)  04/11/22 205 lb (93 kg)   Temp Readings from Last 3 Encounters:  08/03/22 97.8 F (36.6 C) (Oral)  02/14/22 98.3 F (36.8 C) (Tympanic)  01/26/22 98 F (36.7 C) (Oral)   BP Readings from Last 3 Encounters:  08/03/22 136/70  05/09/22 130/60  04/11/22 128/70   Pulse Readings from Last 3 Encounters:  08/03/22 (!) 58  05/09/22 (!) 55  04/11/22 72     Physical Exam Vitals and nursing note reviewed.  Constitutional:      Appearance: Normal appearance. He is well-developed and well-groomed.  HENT:     Head: Normocephalic and atraumatic.  Eyes:     Conjunctiva/sclera: Conjunctivae normal.     Pupils: Pupils are equal, round, and reactive to light.  Cardiovascular:     Rate and Rhythm: Normal rate and regular rhythm.     Heart sounds: Normal heart sounds.  Pulmonary:     Effort: Pulmonary effort is normal. No respiratory distress.     Breath sounds: Normal breath sounds.  Abdominal:     Tenderness: There is no abdominal tenderness.  Skin:    General: Skin is warm and moist.  Neurological:     General: No focal deficit present.     Mental Status: He is alert and oriented to person, place, and time. Mental status is at baseline.     Sensory: Sensation is intact.     Motor: Motor function is intact.     Coordination: Coordination is intact.     Gait: Gait is intact. Gait normal.  Psychiatric:        Attention and Perception: Attention and perception normal.        Mood and Affect: Mood and affect normal.        Speech: Speech normal.        Behavior: Behavior normal. Behavior is cooperative.        Thought Content: Thought content normal.        Cognition and Memory: Cognition and memory normal.        Judgment: Judgment normal.     Assessment  Plan  Primary hypertension - Plan: Comprehensive metabolic panel, Lipid panel, CBC with Differential/Platelet meds norvasc 10, lotensin 40, zetia 10 and lipitor  40  He f/u cardiology   Prediabetes - Plan: Hemoglobin A1c  Hyperlipidemia, unspecified hyperlipidemia type - Plan: ezetimibe (ZETIA) 10 MG tablet, atorvastatin (LIPITOR) 40 MG tablet  CAD in native artery - Plan: amLODipine (NORVASC) 10 MG tablet Hx of CABG - Plan: amLODipine (NORVASC) 10 MG tablet  Gastroesophageal reflux disease - Plan: pantoprazole (PROTONIX) 40 MG tablet  HM Declined flu shot Consider  prevnar 20 12/03/22  Had prevnar, zostervax, utd pna 23 vaccine Disc shingrix for future and Tdap given Rx given today 08/03/22 Disc hep A/B vaccine prev given Rx previously with fatty liver  Pfizer 2/2 consider booster 06/20/21 pt declines      Had PSA 1.7 09/2020  Results for Christian Cole, Christian Cole (MRN [id number]) as of 12/21/2020 10:19   Ref. Range 04/16/22  Prostate Specific Ag, Serum Latest Ref Range: 0.0 - 4.0 ng/mL 0.7  Urology 08/2022   Hep C neg  Has mvt with vitamin D3    CT chest had 01/2021 rec smoking cessation  IMPRESSION: 1. Lung-RADS 2, benign appearance or behavior. Continue annual screening with low-dose chest CT without contrast in 12 months. 2. Stable 4.3 cm ascending thoracic aortic diameter. Recommend attention on follow-up annual screening chest CT. Aortic aneurysm NOS (ICD10-I71.9) 3.  Emphysema (ICD10-J43.9) and Aortic Atherosclerosis (ICD10-170.0)     Electronically Signed   By: Misty Stanley M.D.   On: 01/12/2021 14:14   US abdomen 12/10/17 neg normal spleen size, +fatty liver, 1.3 cm right kidney cyst    Colonoscopy had 02/16/14 tubular adenoma h/o polyps repeat in 5 years [facility] pt will call  [facility] EGD/colonoscopy 04/15/20 repeat Q5 years Dr. Bary Castilla colonoscopy + hemorrhoids, and EGD with bx reflux esophagitis Q5 years egd/colonoscopy 04/15/20 utd    Sees derm Dr. 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 Patrick North [phone] fax [phone] benefits CPAP f/u in 1 year     Local meds [facility] protonix, wellbutrin chronic meds [facility]  Provider: Dr. [name] [name]-Internal Medicine

## 2022-08-04 MED ORDER — TESTOSTERONE CYPIONATE 200 MG/ML IM SOLN: INTRAMUSCULAR | 0 refills | Status: DC

## 2022-08-06 LAB — HEMOGLOBIN A1C: Hgb A1c MFr Bld: 6 % (ref 4.6–6.5)

## 2022-08-16 ENCOUNTER — Inpatient Hospital Stay (HOSPITAL_BASED_OUTPATIENT_CLINIC_OR_DEPARTMENT_OTHER): Payer: Medicare Other | Admitting: Oncology

## 2022-08-16 ENCOUNTER — Encounter: Payer: Self-pay | Admitting: Oncology

## 2022-08-16 ENCOUNTER — Inpatient Hospital Stay: Payer: Medicare Other | Attending: Oncology

## 2022-08-16 ENCOUNTER — Inpatient Hospital Stay: Payer: Medicare Other

## 2022-08-16 VITALS — BP 141/72 | HR 57 | Temp 98.8°F | Resp 18 | Wt 218.4 lb

## 2022-08-16 DIAGNOSIS — Z800 Family history of malignant neoplasm of digestive organs: Secondary | ICD-10-CM | POA: Insufficient documentation

## 2022-08-16 DIAGNOSIS — D751 Secondary polycythemia: Secondary | ICD-10-CM | POA: Insufficient documentation

## 2022-08-16 DIAGNOSIS — D696 Thrombocytopenia, unspecified: Secondary | ICD-10-CM | POA: Insufficient documentation

## 2022-08-16 DIAGNOSIS — T387X5A Adverse effect of androgens and anabolic congeners, initial encounter: Secondary | ICD-10-CM | POA: Insufficient documentation

## 2022-08-16 DIAGNOSIS — I10 Essential (primary) hypertension: Secondary | ICD-10-CM | POA: Diagnosis not present

## 2022-08-16 DIAGNOSIS — Z85828 Personal history of other malignant neoplasm of skin: Secondary | ICD-10-CM | POA: Diagnosis not present

## 2022-08-16 DIAGNOSIS — F17210 Nicotine dependence, cigarettes, uncomplicated: Secondary | ICD-10-CM | POA: Insufficient documentation

## 2022-08-16 LAB — CBC WITH DIFFERENTIAL/PLATELET
Abs Immature Granulocytes: 0.01 10*3/uL (ref 0.00–0.07)
Basophils Absolute: 0 10*3/uL (ref 0.0–0.1)
Basophils Relative: 1 %
Eosinophils Absolute: 0.1 10*3/uL (ref 0.0–0.5)
Eosinophils Relative: 2 %
HCT: 48.7 % (ref 39.0–52.0)
Hemoglobin: 16.5 g/dL (ref 13.0–17.0)
Immature Granulocytes: 0 %
Lymphocytes Relative: 41 %
Lymphs Abs: 2.6 10*3/uL (ref 0.7–4.0)
MCH: 31.6 pg (ref 26.0–34.0)
MCHC: 33.9 g/dL (ref 30.0–36.0)
MCV: 93.3 fL (ref 80.0–100.0)
Monocytes Absolute: 0.8 10*3/uL (ref 0.1–1.0)
Monocytes Relative: 13 %
Neutro Abs: 2.8 10*3/uL (ref 1.7–7.7)
Neutrophils Relative %: 43 %
Platelets: 133 10*3/uL — ABNORMAL LOW (ref 150–400)
RBC: 5.22 MIL/uL (ref 4.22–5.81)
RDW: 15.1 % (ref 11.5–15.5)
WBC: 6.3 10*3/uL (ref 4.0–10.5)
nRBC: 0 % (ref 0.0–0.2)

## 2022-08-16 LAB — COMPREHENSIVE METABOLIC PANEL
ALT: 25 U/L (ref 0–44)
AST: 28 U/L (ref 15–41)
Albumin: 4.1 g/dL (ref 3.5–5.0)
Alkaline Phosphatase: 69 U/L (ref 38–126)
Anion gap: 5 (ref 5–15)
BUN: 16 mg/dL (ref 8–23)
CO2: 28 mmol/L (ref 22–32)
Calcium: 8.9 mg/dL (ref 8.9–10.3)
Chloride: 106 mmol/L (ref 98–111)
Creatinine, Ser: 1.13 mg/dL (ref 0.61–1.24)
GFR, Estimated: >60 mL/min (ref >60)
Glucose, Bld: 100 mg/dL — ABNORMAL HIGH (ref 70–99)
Potassium: 4.7 mmol/L (ref 3.5–5.1)
Sodium: 139 mmol/L (ref 135–145)
Total Bilirubin: 1 mg/dL (ref 0.3–1.2)
Total Protein: 6.6 g/dL (ref 6.5–8.1)

## 2022-08-16 NOTE — Assessment & Plan Note (Signed)
Thrombocytopenia, likely ITP Previous work-up showed normal folate and vitamin B12 level, negative flowcytometry and monoclonal gammopathy workup. Hepatitis B surface antigen tested negative in 2019.  Hep C negative Platelet count remained stable.  [place]

## 2022-08-16 NOTE — Progress Notes (Signed)
Pt here for follow up. Pt reports a spot was found on right kidney, but he is being followed by urology for that.

## 2022-08-16 NOTE — Progress Notes (Signed)
Hematology/Oncology Progress note [phone] [phone]      Patient Care Team: [name], Nino Glow, MD as PCP - General (Internal Medicine) Troy Sine, MD as PCP - Cardiology (Cardiology) Earlie Server, MD as Consulting Physician (Oncology)  ASSESSMENT & PLAN:   Erythrocytosis Secondary erythrocytosis,due to testosterone replacement therapy. Hematocrit is < 50. Hold off phlebotomy  Thrombocytopenia (HCC) Thrombocytopenia, likely ITP Previous work-up showed normal folate and vitamin B12 level, negative flowcytometry and monoclonal gammopathy workup. Hepatitis B surface antigen tested negative in 2019.  Hep C negative Platelet count remained stable.  [place]  Orders Placed This Encounter  Procedures   CBC with Differential/Platelet    Standing Status:   Future    Standing Expiration Date:   08/17/2023   Follow up in 6 months All questions were answered. The patient knows to call the clinic with any problems, questions or concerns.  Earlie Server, MD, PhD [facility] Hematology Oncology 08/16/2022    CHIEF COMPLAINTS/REASON FOR VISIT:  Follow-up for thrombocytopenia and erythrocytosis  HISTORY OF PRESENTING ILLNESS:  Christian Cole is a 71 y.o. male who was seen in consultation at the request of [name], [name] * for evaluation of thrombocytopenia and erythrocytosis.    Reviewed patient's labs done previously.  12/21/2020 labs showed decreased platelet counts at 1 39,000. wbc 8.4 hemoglobin 18.3. Reviewed patient's previous labs. Thrombocytopenia is chronic chronic onset , since at least 2015. No aggravating or elevated factors. Erythrocytosis, started after patient was started on testosterone replacement therapy. He previously never had erythrocytosis sent to them. Associated symptoms or signs:  Denies weight loss, fever, chills, fatigue, night sweats.  Denies hematochezia, hematuria, hematemesis, epistaxis, black tarry stool.  easy  bruising.   Patient drinks 3 beers a day.  Sleep apnea, patient reports being compliant with CPAP machine  INTERVAL HISTORY Christian Cole is a 71 y.o. male who has above history reviewed by me today presents for follow up visit for management of erythrocytosis and thrombocytopenia. Patient has no new complaints.  He is on testosterone replacement therapy.  07/19/2022, ultrasound kidney showed hypoechoic kidney mass.  Patient follows with urologist and was recommended to observation and follow-up with additional imaging in the future.  Review of Systems  Constitutional:  Negative for appetite change, chills, fatigue, fever and unexpected weight change.  HENT:   Negative for hearing loss and voice change.   Eyes:  Negative for eye problems and icterus.  Respiratory:  Negative for chest tightness, cough and shortness of breath.   Cardiovascular:  Negative for chest pain and leg swelling.  Gastrointestinal:  Negative for abdominal distention and abdominal pain.  Endocrine: Negative for hot flashes.  Genitourinary:  Negative for difficulty urinating, dysuria and frequency.   Musculoskeletal:  Negative for arthralgias.  Skin:  Negative for itching and rash.  Neurological:  Negative for light-headedness and numbness.  Hematological:  Negative for adenopathy. Does not bruise/bleed easily.  Psychiatric/Behavioral:  Negative for confusion.     MEDICAL HISTORY:  Past Medical History:  Diagnosis Date   Aortic atherosclerosis (HCC)    BPH (benign prostatic hyperplasia)    CAD (coronary artery disease)    a. s/p CABG in 2000 with LIMA-LAD, RIMA-PDA, SVG-D1, and SVG-PLA b. low-risk NST in 2016   Carotid artery stenosis    Cyst of right kidney    ED (erectile dysfunction)    a.) penile implant in place   Fatty liver    GERD (gastroesophageal reflux disease)    Hyperlipidemia  Hypertension    Long term current use of antithrombotics/antiplatelets    a.) clopidogrel   Low testosterone     a.) on exogenous TRT   OSA on CPAP    Pre-diabetes    S/P CABG x 4 2000   a.) LIMA-LAD, RIMA-PDA, SVG-D1, SVG-PLA   Secondary erythrocytosis    a.) smoking + TRT + OSAH   Sinus bradycardia    Skin cancer    Thrombocytopenia (HCC)    Tongue lesion    benign from trauma as of 06/20/21    SURGICAL HISTORY: Past Surgical History:  Procedure Laterality Date   CATARACT EXTRACTION, BILATERAL  2010   COLONOSCOPY WITH PROPOFOL N/A 04/15/2020   Procedure: COLONOSCOPY WITH PROPOFOL;  Surgeon: Robert Bellow, MD;  Location: [facility] ENDOSCOPY;  Service: Endoscopy;  Laterality: N/A;   CORONARY ARTERY BYPASS GRAFT  2000   LIMA to the LAD,RIMA to the PDA,vein graft to the diagonal & vein graft to the PLA   ESOPHAGOGASTRODUODENOSCOPY     Dr. Gustavo Lah   ESOPHAGOGASTRODUODENOSCOPY  04/15/2020   Procedure: ESOPHAGOGASTRODUODENOSCOPY (EGD);  Surgeon: Robert Bellow, MD;  Location: [facility] ENDOSCOPY;  Service: Endoscopy;;   HOLEP-LASER ENUCLEATION OF THE PROSTATE WITH MORCELLATION N/A 11/27/2021   Procedure: HOLEP-LASER ENUCLEATION OF THE PROSTATE WITH MORCELLATION;  Surgeon: Hollice Espy, MD;  Location: [facility] ORS;  Service: Urology;  Laterality: N/A;   INGUINAL HERNIA REPAIR Left 01/11/2022   Procedure: HERNIA REPAIR INGUINAL ADULT;  Surgeon: Olean Ree, MD;  Location: [facility] ORS;  Service: General;  Laterality: Left;   KNEE ARTHROSCOPY Right 1991   KNEE DEBRIDEMENT Right 1989   Fluid flush   PENILE PROSTHESIS IMPLANT     06/26/19 Dr. Francesca Jewett   SHOULDER ARTHROSCOPY Left 7902   [place] N/A 01/11/2022   Procedure: HERNIA REPAIR UMBILICAL ADULT;  Surgeon: Olean Ree, MD;  Location: [facility] ORS;  Service: General;  Laterality: N/A;   US ECHOCARDIOGRAPHY  12/12/2010   mild LA dilatation, normal LV systolic fx, EF > 40%   XI ROBOTIC ASSISTED INGUINAL HERNIA REPAIR WITH MESH Right 01/11/2022   Procedure: XI ROBOTIC ASSISTED INGUINAL HERNIA REPAIR WITH MESH;  Surgeon: Olean Ree, MD;   Location: [facility] ORS;  Service: General;  Laterality: Right;    SOCIAL HISTORY: Social History   Socioeconomic History   Marital status: Married    Spouse name: Not on file   Number of children: 3   Years of education: 16   Highest education level: Not on file  Occupational History   Occupation: Retired Corporate treasurer   Occupation: Management/Technical Support    Comment: Psychiatric nurse  Tobacco Use   Smoking status: Every Day    Packs/day: 0.50    Years: 44.00    Total pack years: 22.00    Types: Cigarettes   Smokeless tobacco: Never   Tobacco comments:    on and off smoker   Vaping Use   Vaping Use: Never used  Substance and Sexual Activity   Alcohol use: Yes    Alcohol/week: 10.0 standard drinks of alcohol    Types: 10 Cans of beer per week    Comment: Down to 3 beers a day from 4-6 a day    Drug use: No   Sexual activity: Yes    Partners: Female    Comment: Wife  Other Topics Concern   Not on file  Social History Narrative   Kashon grew up in [place]. He is currently living in [place] with his wife. This is his  second marriage. He has 1 daughter and 2 sons from his first marriage. He has 4 step kids. His daughter lives in [place], one son in [place], [place] and the other son in [place], [place]. 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    He enjoys wood working on his spare time. 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       He retired 04/2019 and enjoys this       2 brothers and 2 sisters he is next to youngest    Social Determinants of Radio broadcast assistant Strain: Not on file  Food Insecurity: Not on file  Transportation Needs: Not on file  Physical Activity: Not on file  Stress: Not on file  Social Connections: Not on file  Intimate Partner Violence: Not on file    FAMILY HISTORY: Family History   Problem Relation Age of Onset   Arthritis Mother    Heart disease Father        CAD - MI   Stroke Father    Hypertension Father    Cancer Father 33       esophageal cancer    Arthritis Maternal Grandmother     ALLERGIES:  is allergic to tamsulosin.  MEDICATIONS:  Current Outpatient Medications  Medication Sig Dispense Refill   amLODipine (NORVASC) 10 MG tablet Take 1 tablet (10 mg total) by mouth every evening. 90 tablet 3   atorvastatin (LIPITOR) 40 MG tablet TAKE 1 TABLET(40 MG) BY MOUTH DAILY AT NIGHT 90 tablet 3   benazepril (LOTENSIN) 40 MG tablet Take 1 tablet (40 mg total) by mouth every evening. 90 tablet 3   clopidogrel (PLAVIX) 75 MG tablet Take 1 tablet (75 mg total) by mouth every evening. 90 tablet 3   ezetimibe (ZETIA) 10 MG tablet TAKE 1 TABLET(10 MG) BY MOUTH DAILY 90 tablet 3   ibuprofen (ADVIL) 200 MG tablet Take 400 mg by mouth every 8 (eight) hours as needed (pain.).     Multiple Vitamins-Minerals (MULTIVITAMIN WITH MINERALS) tablet Take 1 tablet by mouth every evening. Men 50+     NEEDLE, DISP, 18 G (B-D HYPODERMIC NEEDLE 18GX1.5") 18G X 1-1/2" MISC DRW UP MEDICATION WITH THIS NEEDLE 45 each 3   pantoprazole (PROTONIX) 40 MG tablet Take 1 tablet (40 mg total) by mouth every evening. TAKE 1 TABLET BY MOUTH EVERY DAY 30 MINUTES BEFORE A MEAL 90 tablet 3   SYRINGE-NEEDLE, DISP, 3 ML 23G X 1" 3 ML MISC 1.5 mg by Does not apply route every 14 (fourteen) days. 45 each 3   testosterone cypionate (DEPOTESTOSTERONE CYPIONATE) 200 MG/ML injection ADMINISTER 1.5 ML(300 MG) IN THE MUSCLE EVERY 14 DAYS 10 mL 0   No current facility-administered medications for this visit.     PHYSICAL EXAMINATION: ECOG PERFORMANCE STATUS: 0 - Asymptomatic Vitals:   08/16/22 1343  BP: (!) 141/72  Pulse: (!) 57  Resp: 18  Temp: 98.8 F (37.1 C)   Filed Weights   08/16/22 1343  Weight: 218 lb 6.4 oz (99.1 kg)    Physical Exam Constitutional:      General: He is not in acute  distress. HENT:     Head: Normocephalic and atraumatic.  Eyes:     General: No scleral icterus. Cardiovascular:     Rate and Rhythm: Normal  rate and regular rhythm.     Heart sounds: Normal heart sounds.  Pulmonary:     Effort: Pulmonary effort is normal. No respiratory distress.     Breath sounds: No wheezing.  Abdominal:     General: Bowel sounds are normal. There is no distension.     Palpations: Abdomen is soft.  Musculoskeletal:        General: No deformity. Normal range of motion.     Cervical back: Normal range of motion and neck supple.  Skin:    General: Skin is warm and dry.     Findings: No erythema or rash.  Neurological:     Mental Status: He is alert and oriented to person, place, and time. Mental status is at baseline.     Cranial Nerves: No cranial nerve deficit.     Coordination: Coordination normal.  Psychiatric:        Mood and Affect: Mood normal.      LABORATORY DATA:  I have reviewed the data as listed Lab Results  Component Value Date   WBC 6.3 08/16/2022   HGB 16.5 08/16/2022   HCT 48.7 08/16/2022   MCV 93.3 08/16/2022   PLT 133 (L) 08/16/2022   Recent Labs    12/01/21 0322 01/03/22 1111 02/14/22 1330 08/03/22 1021 08/16/22 1320  NA 133*   < > 132* 139 139  K 3.7   < > 3.9 4.6 4.7  CL 106   < > 101 104 106  CO2 21*   < > '23 25 28  '$ GLUCOSE 133*   < > 127* 114* 100*  BUN 20   < > '22 17 16  '$ CREATININE 1.20   < > 1.27* 1.19 1.13  CALCIUM 8.2*   < > 8.8* 9.3 8.9  GFRNONAA >60  --  >60  --  >60  PROT  --    < > 7.2 6.9 6.6  ALBUMIN  --    < > 4.4 4.4 4.1  AST  --    < > '27 28 28  '$ ALT  --    < > '23 26 25  '$ ALKPHOS  --    < > 77 81 69  BILITOT  --    < > 0.9 0.9 1.0   < > = values in this interval not displayed.    Iron/TIBC/Ferritin/ %Sat No results found for: "IRON", "TIBC", "FERRITIN", "IRONPCTSAT"    RADIOGRAPHIC STUDIES: I have personally reviewed the radiological images as listed and agreed with the findings in the report.   Ultrasound renal complete  Result Date: 07/19/2022 CLINICAL DATA:  Follow-up renal lesion partially characterized on the February 07, 2022 MRI of the abdomen EXAM: RENAL / URINARY TRACT ULTRASOUND COMPLETE COMPARISON:  MRI of the abdomen February 07, 2022 FINDINGS: Right Kidney: Renal measurements: 11.2 x 6.6 x 5.9 cm = volume: 230.1 mL. Two cysts are identified in the lower pole of the right kidney measuring 1.8 and 2.3 cm. The cysts are simple in appearance. No follow-up imaging recommended for the cysts. There is a hypoechoic exophytic mass off the mid pole of the right kidney measuring 1.1 x 1.1 x 0.9 cm today. This mass measured up to 1.5 cm on the comparison MRI. Differences in measurement could be technical. Left Kidney: Renal measurements: 10.8 x 6.0 x 5.5 cm = volume: 186 mL. Echogenicity within normal limits. No mass or hydronephrosis visualized. Bladder: The bladder is unremarkable. There is a small postvoid residual. The postvoid volume was not calculated.  Other: None. IMPRESSION: 1. There is a hypoechoic mass exophytic off the mid pole of the right kidney correlating with the indeterminate MRI finding. While a complicated or hemorrhagic cyst is a possibility, a solid mass cannot be excluded based on today's study. Recommend either a repeat MRI of the abdomen without motion or a CT of the abdomen with renal mass protocol for further characterization. 2. The kidneys are otherwise unremarkable. 3. There is a subjectively small postvoid residual in an otherwise normal appearing bladder. The postvoid volume was not calculated. Electronically Signed   By: Dorise Bullion III M.D.   On: 07/19/2022 14:04

## 2022-08-16 NOTE — Assessment & Plan Note (Signed)
Secondary erythrocytosis,due to testosterone replacement therapy. Hematocrit is < 50. Hold off phlebotomy

## 2022-10-15 ENCOUNTER — Other Ambulatory Visit: Payer: Medicare Other

## 2022-10-15 DIAGNOSIS — E291 Testicular hypofunction: Secondary | ICD-10-CM | POA: Diagnosis not present

## 2022-10-16 ENCOUNTER — Telehealth: Payer: Self-pay | Admitting: *Deleted

## 2022-10-16 LAB — HEMATOCRIT: Hematocrit: 50.1 % (ref 37.5–51.0)

## 2022-10-16 LAB — TESTOSTERONE: Testosterone: 418 ng/dL (ref 264–916)

## 2022-10-16 NOTE — Telephone Encounter (Signed)
-----   Message from Abbie Sons, MD sent at 10/16/2022  7:22 AM EST ----- Hematocrit normal and testosterone looks good.  He is due for a CT abdomen March 2024.  The order is in but it has not been scheduled.

## 2022-10-16 NOTE — Telephone Encounter (Signed)
Notified patient as instructed, patient pleased. Reminded patient about ct scan . He is going to call them and set it up because he dont want it missed.

## 2022-11-16 ENCOUNTER — Encounter: Payer: Self-pay | Admitting: Cardiovascular Disease

## 2022-11-23 DIAGNOSIS — D0439 Carcinoma in situ of skin of other parts of face: Secondary | ICD-10-CM | POA: Diagnosis not present

## 2022-11-23 DIAGNOSIS — D2261 Melanocytic nevi of right upper limb, including shoulder: Secondary | ICD-10-CM | POA: Diagnosis not present

## 2022-11-23 DIAGNOSIS — D225 Melanocytic nevi of trunk: Secondary | ICD-10-CM | POA: Diagnosis not present

## 2022-11-23 DIAGNOSIS — L570 Actinic keratosis: Secondary | ICD-10-CM | POA: Diagnosis not present

## 2022-11-23 DIAGNOSIS — D485 Neoplasm of uncertain behavior of skin: Secondary | ICD-10-CM | POA: Diagnosis not present

## 2022-11-23 DIAGNOSIS — D0462 Carcinoma in situ of skin of left upper limb, including shoulder: Secondary | ICD-10-CM | POA: Diagnosis not present

## 2022-11-23 DIAGNOSIS — D2262 Melanocytic nevi of left upper limb, including shoulder: Secondary | ICD-10-CM | POA: Diagnosis not present

## 2022-11-23 DIAGNOSIS — D2272 Melanocytic nevi of left lower limb, including hip: Secondary | ICD-10-CM | POA: Diagnosis not present

## 2022-11-23 DIAGNOSIS — D2271 Melanocytic nevi of right lower limb, including hip: Secondary | ICD-10-CM | POA: Diagnosis not present

## 2022-11-23 DIAGNOSIS — L821 Other seborrheic keratosis: Secondary | ICD-10-CM | POA: Diagnosis not present

## 2023-01-09 DIAGNOSIS — D0439 Carcinoma in situ of skin of other parts of face: Secondary | ICD-10-CM | POA: Diagnosis not present

## 2023-01-16 ENCOUNTER — Encounter: Payer: Self-pay | Admitting: Oncology

## 2023-01-16 DIAGNOSIS — C44321 Squamous cell carcinoma of skin of nose: Secondary | ICD-10-CM | POA: Diagnosis not present

## 2023-01-16 DIAGNOSIS — D0462 Carcinoma in situ of skin of left upper limb, including shoulder: Secondary | ICD-10-CM | POA: Diagnosis not present

## 2023-01-16 DIAGNOSIS — D485 Neoplasm of uncertain behavior of skin: Secondary | ICD-10-CM | POA: Diagnosis not present

## 2023-01-18 ENCOUNTER — Ambulatory Visit
Admission: RE | Admit: 2023-01-18 | Discharge: 2023-01-18 | Disposition: A | Discharge status: Home / Self Care | Payer: Medicare Other | Source: Ambulatory Visit | Attending: Urology | Admitting: Urology

## 2023-01-18 DIAGNOSIS — N281 Cyst of kidney, acquired: Secondary | ICD-10-CM | POA: Diagnosis not present

## 2023-01-18 DIAGNOSIS — Z0389 Encounter for observation for other suspected diseases and conditions ruled out: Secondary | ICD-10-CM | POA: Diagnosis not present

## 2023-01-18 MED ORDER — IOHEXOL 300 MG/ML  SOLN
100.0000 mL | Freq: Once | INTRAMUSCULAR | Status: AC | PRN
  Administered 2023-01-18: 100 mL via INTRAVENOUS

## 2023-01-25 ENCOUNTER — Encounter: Payer: Self-pay | Admitting: Urology

## 2023-01-29 ENCOUNTER — Other Ambulatory Visit: Payer: Self-pay | Admitting: Family Medicine

## 2023-02-01 MED ORDER — TESTOSTERONE CYPIONATE 200 MG/ML IM SOLN: INTRAMUSCULAR | 0 refills | Status: DC

## 2023-02-04 ENCOUNTER — Encounter: Payer: Medicare Other | Admitting: Family Medicine

## 2023-02-06 DIAGNOSIS — C44321 Squamous cell carcinoma of skin of nose: Secondary | ICD-10-CM | POA: Diagnosis not present

## 2023-02-06 DIAGNOSIS — L988 Other specified disorders of the skin and subcutaneous tissue: Secondary | ICD-10-CM | POA: Diagnosis not present

## 2023-02-06 DIAGNOSIS — L578 Other skin changes due to chronic exposure to nonionizing radiation: Secondary | ICD-10-CM | POA: Diagnosis not present

## 2023-02-06 DIAGNOSIS — L814 Other melanin hyperpigmentation: Secondary | ICD-10-CM | POA: Diagnosis not present

## 2023-02-07 ENCOUNTER — Ambulatory Visit: Payer: Medicare Other | Attending: Cardiovascular Disease | Admitting: Cardiovascular Disease

## 2023-02-07 ENCOUNTER — Encounter: Payer: Self-pay | Admitting: Cardiovascular Disease

## 2023-02-07 DIAGNOSIS — G4733 Obstructive sleep apnea (adult) (pediatric): Secondary | ICD-10-CM | POA: Diagnosis not present

## 2023-02-07 DIAGNOSIS — J432 Centrilobular emphysema: Secondary | ICD-10-CM | POA: Insufficient documentation

## 2023-02-07 DIAGNOSIS — Z951 Presence of aortocoronary bypass graft: Secondary | ICD-10-CM | POA: Diagnosis not present

## 2023-02-07 DIAGNOSIS — D696 Thrombocytopenia, unspecified: Secondary | ICD-10-CM | POA: Insufficient documentation

## 2023-02-07 DIAGNOSIS — I440 Atrioventricular block, first degree: Secondary | ICD-10-CM | POA: Diagnosis not present

## 2023-02-07 DIAGNOSIS — I2510 Atherosclerotic heart disease of native coronary artery without angina pectoris: Secondary | ICD-10-CM | POA: Insufficient documentation

## 2023-02-07 DIAGNOSIS — I10 Essential (primary) hypertension: Secondary | ICD-10-CM | POA: Insufficient documentation

## 2023-02-07 NOTE — Patient Instructions (Signed)
Medication Instructions:  No changes  *If you need a refill on your cardiac medications before your next appointment, please call your pharmacy*   Lab Work: Not  needed    Testing/Procedures: Not needed   Follow-Up: At [facility], you and your health needs are our priority.  As part of our continuing mission to provide you with exceptional heart care, we have created designated Provider Care Teams.  These Care Teams include your primary Cardiologist (physician) and Advanced Practice Providers (APPs -  Physician Assistants and Nurse Practitioners) who all work together to provide you with the care you need, when you need it.     Your next appointment:    3 TO 4 month(s) Sleep   The format for your next appointment:   In Person  Provider:   Shelva Majestic, MD    Other Instructions     Recommend you need a new C-PAP - MACHINE . IT WILL BE ORDERED

## 2023-02-07 NOTE — Progress Notes (Signed)
Patient ID: [name], male   DOB: August 26, 1951, 72 y.o.   MRN: [id number]       HPI: Christian Cole is a 72 y.o. male the presents for a 9 month cardiology/sleep evaluation.  Christian Cole has known CAD and underwent initial percutaneous coronary intervention in 1998 of an unknown vessel. He underwent CABG revascularization surgery in December 2000 with LIMA to LAD, LIMA to the PDA, vein to the diagonal, and then to the PLA. 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  An echo Doppler study in January 2002 showed normal systolic function. His mild TR and mild Christian. A nuclear perfusion study in March 2013 showed normal perfusion without scar or ischemia.  When I saw him in 2016 he had continued to do well and remained active and denied any chest pain.   He he had seen [facility] in March 2016 and she had reduced his metoprolol to 12.5 mg twice a day due to asymptomatic bradycardia.  He had blood work in December 2015 by his primary care physician and his total cholesterol was 166, HDL 32, LDL 104, VLDL 29 and triglycerides 149.  He has been maintained on atorvastatin 40 mg, Plavix 75 mg, pantoprazole 40 mg, as well as Viagra as needed.  He underwent a nuclear perfusion study on April 26, 2015.  He remained asymptomatic.  He developed asymptomatic ST segment depression during stress.  Scintigraphic images were entirely normal.  He had normal function.  Ejection fraction was 52%.  He presents for evaluation.  When I saw him in October 2017 he was doing well and was without chest pain.  He was exercising daily.  He had lost over 40 pounds in the 8 months prior to that evaluation.  His peak weight was 242 and his weight this morning without clothes at home was 195.  He continues to use CPAP with 100% compliance.   He was evaluated by [facility] Cole in  October 2018.  His blood pressure was elevated and amlodipine 5 mg was added to his benazepril and Lopressor.  He had resumed smoking and at that time was given a prescription for Chantix.  I saw him in December 2019 and prior to that evaluation he had  received a new CPAP machine which was prescribed by physician in [facility].  He was sleeping well and cannot sleep without his CPAP.  Unfortunately he was still smoking 1/2 pack of cigarettes per day.  He is working 30 hours/week as a [facility].  He walks at least 2 miles.  He denies any chest pain PND orthopnea.   He underwent carotid Dopplers in June 2020 which did not reveal any significant internal carotid stenoses in the 1 to 39% range.  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  A Myoview study in July 2020 showed EF 55 to 60% without scar or ischemia.  He was seen by Christian Deforest, PA for preoperative clearance and underwent successful inflatable penile prosthesis by [facility] at [facility].  He tolerated surgery without cardiovascular compromise.  I saw him in February 2021 at which time he denied any anginal symptomatology.  Unfortunately he was still smoking.  He continued to be compliant with CPAP therapy and "cannot sleep without it."  I saw him on January 09, 2021.  He remained stable and denied any chest pain  or shortness of breath but unfortunately was still smoking.   He has been working around the house remodeling his bathroom.  He has been on amlodipine 7.5 mg, benazepril 40 mg for hypertension, atorvastatin 40 mg and Zetia for hyperlipidemia, and is on testosterone injections every 14 days with continued low testosterone levels.  He continues to use CPAP with 100% compliance.    I saw him on September 04, 2021 and since his prior evaluation he was evaluated at [facility] and presented with a warm sensation to the left side of his chest radiating to his neck.  It was not  exertionally precipitated.  A chest x-ray did not reveal any active cardiopulmonary disease.  His ECG did not show any ischemia.  Troponins were negative.  He believes he started to notice the symptoms when he was started on Flomax and he was felt to have possible sensitivity to this.  He is now on silodosin (Rapaflo) and is tolerating this well.  He continues to be on amlodipine 7.5 mg daily and benazepril 40 mg for hypertension.  He is on atorvastatin 40 mg and Zetia 10 mg for hyperlipidemia.  He is on testosterone supplementation every 2 weeks.  He continues to use CPAP therapy with [employer] as his DME company in [place].  A download was obtained from September 22 through September 01, 2021 which shows excellent compliance with average use at 8 hours and 16 minutes per night.  He has been using a Respironics DreamWear mask.  At 13 cm set pressure, AHI is 5.3.  He is still smoking at least a half a pack of cigarettes per day and his wife also smokes.  He has some discomfort from bilateral inguinal hernias for which he wears a truss.  During that evaluation, with his ongoing tobacco history, and chest pain symptomatology I recommended he undergo a Lexiscan Myoview study.  He was continuing to use CPAP with excellent compliance and "cannot sleep without it."   On October 13, 2021 he underwent his [facility] study.  This was low risk and did not demonstrate any ischemia.  There was normal wall motion.    I saw him on November 20, 2021.  On October 13, 2021 he had undergone a [place] Myoview study which was unchanged from previously and remained low risk without ischemia.  He had bilateral inguinal hernia as well as an umbilical hernia and was planning to undergo future surgery.  He continues to use CPAP with 100% compliance with his DME company called "[facility] "in [place]. He denied recurrent chest pain.  He was sleeping well.  He typically goes to bed at 10 PM and wakes up at 6:30 AM.  Download  was obtained of his CPAP unit from December 8 through November 17, 2021.  Compliance is excellent with average use of 7 hours and 27 minutes.  His current CPAP unit is not an auto unit and is set at a pressure of 14 cm.  AHI was slightly increased at 6.5.  He continues to be on amlodipine and benazepril for blood pressure control.  He is on testosterone injections every 2 weeks and often undergoes phlebotomy followed by hematology.  His next phlebotomy scheduled for January 11.    I last saw  him on May 09, 2022. He underwent open left inguinal hernia repair, robotic assisted right inguinal hernia repair, and open umbilical hernia repair on January 11, 2022 without cardiovascular compromise.  He also undergoes hematologic evaluation for his thrombocytopenia and  erythrocytosis and undergoes periodic phlebotomy.  He denies any chest pain or shortness of breath.  He remains active.  He continues to use CPAP and a download from May 28 through May 07, 2022 confirms excellent compliance with average use at 7 hours and 18 minutes per night.  However, his CPAP is set at a pressure of 16 cm.  AHI is increased at 8.2.  He does not have a CPAP auto unit.  At that evaluation I added EPR of 3 to his device.  We discussed after 5 years he would qualify for a new machine.  Presently, [facility] denies any chest pain.  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  He has continued to use CPAP with 100% compliance which I was to verify on his phone recordings.  Apparently, his unit stopped being wireless since March 10.  His current CPAP machine was set up on January 21, 2018 and is an AirSense 10 CPAP unit and does not have auto capability.  His DME company is now [employer].  He continues to be on amlodipine 10 mg, benazepril 40 mg for hypertension.  He is on Zetia 10 mg and atorvastatin 40 mg for hyperlipidemia.  He takes pantoprazole for GERD.  He continues to be on  clopidogrel 75 mg daily.  He presents for evaluation.   Past Medical History:  Diagnosis Date   Aortic atherosclerosis (HCC)    BPH (benign prostatic hyperplasia)    CAD (coronary artery disease)    a. s/p CABG in 2000 with LIMA-LAD, RIMA-PDA, SVG-D1, and SVG-PLA b. low-risk NST in 2016   Carotid artery stenosis    Cyst of right kidney    ED (erectile dysfunction)    a.) penile implant in place   Fatty liver    GERD (gastroesophageal reflux disease)    Hyperlipidemia    Hypertension    Long term current use of antithrombotics/antiplatelets    a.) clopidogrel   Low testosterone    a.) on exogenous TRT   OSA on CPAP    Pre-diabetes    S/P CABG x 4 2000   a.) LIMA-LAD, RIMA-PDA, SVG-D1, SVG-PLA   Secondary erythrocytosis    a.) smoking + TRT + OSAH   Sinus bradycardia    Skin cancer    Thrombocytopenia (HCC)    Tongue lesion    benign from trauma as of 06/20/21    Past Surgical History:  Procedure Laterality Date   CATARACT EXTRACTION, BILATERAL  2010   COLONOSCOPY WITH PROPOFOL N/A 04/15/2020   Procedure: COLONOSCOPY WITH PROPOFOL;  Surgeon: Robert Bellow, MD;  Location: [facility] ENDOSCOPY;  Service: Endoscopy;  Laterality: N/A;   CORONARY ARTERY BYPASS GRAFT  2000   LIMA to the LAD,RIMA to the PDA,vein graft to the diagonal & vein graft to the PLA   ESOPHAGOGASTRODUODENOSCOPY     Dr. Gustavo Lah   ESOPHAGOGASTRODUODENOSCOPY  04/15/2020   Procedure: ESOPHAGOGASTRODUODENOSCOPY (EGD);  Surgeon: Robert Bellow, MD;  Location: [facility] ENDOSCOPY;  Service: Endoscopy;;   HOLEP-LASER ENUCLEATION OF THE PROSTATE WITH MORCELLATION N/A 11/27/2021   Procedure: HOLEP-LASER ENUCLEATION OF THE PROSTATE WITH MORCELLATION;  Surgeon: Hollice Espy, MD;  Location: [facility] ORS;  Service: Cole;  Laterality: N/A;   INGUINAL HERNIA REPAIR Left 01/11/2022   Procedure: HERNIA REPAIR INGUINAL ADULT;  Surgeon: Olean Ree, MD;  Location: [facility] ORS;  Service: General;  Laterality: Left;   KNEE  ARTHROSCOPY Right 1991   KNEE DEBRIDEMENT Right  1989   Fluid flush   PENILE PROSTHESIS IMPLANT     06/26/19 Dr. Francesca Jewett   SHOULDER ARTHROSCOPY Left [id number]   [place] N/A 01/11/2022   Procedure: HERNIA REPAIR UMBILICAL ADULT;  Surgeon: Olean Ree, MD;  Location: [facility] ORS;  Service: General;  Laterality: N/A;   US ECHOCARDIOGRAPHY  12/12/2010   mild LA dilatation, normal LV systolic fx, EF > [id number]   XI ROBOTIC ASSISTED INGUINAL HERNIA REPAIR WITH MESH Right 01/11/2022   Procedure: XI ROBOTIC ASSISTED INGUINAL HERNIA REPAIR WITH MESH;  Surgeon: Olean Ree, MD;  Location: [facility] ORS;  Service: General;  Laterality: Right;    Allergies  Allergen Reactions   Tamsulosin Other (See Comments)    Chest discomfort, basal dilation with a warm Christian in chest     Current Outpatient Medications  Medication Sig Dispense Refill   amLODipine (NORVASC) 10 MG tablet Take 1 tablet (10 mg total) by mouth every evening. 90 tablet 3   atorvastatin (LIPITOR) 40 MG tablet TAKE 1 TABLET(40 MG) BY MOUTH DAILY AT NIGHT 90 tablet 3   benazepril (LOTENSIN) 40 MG tablet Take 1 tablet (40 mg total) by mouth every evening. 90 tablet 3   clopidogrel (PLAVIX) 75 MG tablet Take 1 tablet (75 mg total) by mouth every evening. 90 tablet 3   ezetimibe (ZETIA) 10 MG tablet TAKE 1 TABLET(10 MG) BY MOUTH DAILY 90 tablet 3   ibuprofen (ADVIL) 200 MG tablet Take 400 mg by mouth every 8 (eight) hours as needed (pain.).     Multiple Vitamins-Minerals (MULTIVITAMIN WITH MINERALS) tablet Take 1 tablet by mouth every evening. Men 50+     NEEDLE, DISP, 18 G (B-D HYPODERMIC NEEDLE 18GX1.5") 18G X 1-1/2" MISC DRW UP MEDICATION WITH THIS NEEDLE 45 each 3   pantoprazole (PROTONIX) 40 MG tablet Take 1 tablet (40 mg total) by mouth every evening. TAKE 1 TABLET BY MOUTH EVERY DAY 30 MINUTES BEFORE A MEAL 90 tablet 3   SYRINGE-NEEDLE, DISP, 3 ML 23G X 1" 3 ML MISC 1.5 mg by Does not apply route every 14 (fourteen) days. 45 each 3    testosterone cypionate (DEPOTESTOSTERONE CYPIONATE) 200 MG/ML injection ADMINISTER 1.5 ML(300 MG) IN THE MUSCLE EVERY 14 DAYS 10 mL 0   No current facility-administered medications for this visit.    Social History   Socioeconomic History   Marital status: Married    Spouse name: Not on file   Number of children: 3   Years of education: 16   Highest education level: Not on file  Occupational History   Occupation: Retired Corporate treasurer   Occupation: Management/Technical Support    Comment: Psychiatric nurse  Tobacco Use   Smoking status: Every Day    Packs/day: 0.50    Years: 44.00    Additional pack years: 0.00    Total pack years: 22.00    Types: Cigarettes   Smokeless tobacco: Never   Tobacco comments:    on and off smoker   Vaping Use   Vaping Use: Never used  Substance and Sexual Activity   Alcohol use: Yes    Alcohol/week: 10.0 standard drinks of alcohol    Types: 10 Cans of beer per week    Comment: Down to 3 beers a day from 4-6 a day    Drug use: No   Sexual activity: Yes    Partners: Female    Comment: Wife  Other Topics Concern   Not on file  Social History Narrative   Christian [facility]  grew up in [place]. He is currently living in [place] with his wife. This is his second marriage. He has 1 daughter and 2 sons from his first marriage. He has 4 step kids. His daughter lives in [place], one son in [place], [place] and the other son in [place], [place]. 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    He enjoys wood working on his spare time. 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       He retired 04/2019 and enjoys this       2 brothers and 2 sisters he is next to youngest    Social Determinants of Radio broadcast assistant Strain: Not on file  Food Insecurity: Not on file  Transportation Needs: Not on file  Physical  Activity: Not on file  Stress: Not on file  Social Connections: Not on file  Intimate Partner Violence: Not on file    Family History  Problem Relation Age of Onset   Arthritis Mother    Heart disease Father        CAD - MI   Stroke Father    Hypertension Father    Cancer Father 70       esophageal cancer    Arthritis Maternal Grandmother     ROS General: Negative; No fevers, chills, or night sweats;  HEENT: Negative; No changes in vision or hearing, sinus congestion, difficulty swallowing Pulmonary: Negative; No cough, wheezing, shortness of breath, hemoptysis Cardiovascular: See history of present illness GI: Negative; No nausea, vomiting, diarrhea, or abdominal pain GU: Low testosterone no dysuria, hematuria, or difficulty voiding Musculoskeletal: Negative; no myalgias, joint pain, or weakness Hematologic/Oncology: History of erythrocytosis and thrombocytopenia. 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  No snoring, daytime sleepiness, hypersomnolence, bruxism, restless legs, hypnogognic hallucinations, no cataplexy Other comprehensive 14 point system review is negative.  PE BP 116/62 (BP Location: Left Arm, Patient Position: Sitting, Cuff Size: Normal)   Pulse 65   Ht 5\' 11"  (1.803 m)   Wt 220 lb 9.6 oz (100.1 kg)   SpO2 94%   BMI 30.77 kg/m    Repeat blood pressure 136/70  Wt Readings from Last 3 Encounters:  02/07/23 220 lb 9.6 oz (100.1 kg)  08/16/22 218 lb 6.4 oz (99.1 kg)  08/03/22 216 lb 9.6 oz (98.2 kg)   General: Alert, oriented, no distress.  Skin: normal turgor, no rashes, warm and dry HEENT: Normocephalic, atraumatic. Pupils equal round and reactive to light; sclera anicteric; extraocular muscles  intact;  Nose: Bandage secondary to resection of squamous cell carcinoma yesterday Mouth/Parynx benign; Mallinpatti scale 3 Neck: No JVD, no carotid bruits; normal carotid upstroke Lungs: clear to ausculatation and percussion; no wheezing or rales Chest wall: without tenderness to palpitation Heart: PMI not displaced, RRR, s1 s2 normal, 1/6 systolic murmur, no diastolic murmur, no rubs, gallops, thrills, or heaves Abdomen: soft, nontender; no hepatosplenomehaly, BS+; abdominal aorta nontender and not dilated by palpation. Back: no CVA tenderness Pulses 2+ Musculoskeletal: full range of motion, normal strength, no joint deformities Extremities: no clubbing cyanosis or edema, Homan's sign negative  Neurologic: grossly nonfocal; Cranial  nerves grossly wnl Psychologic: Normal mood and affect   February 07, 2023 ECG (independently read by me): Sinus rhythm at 65, 1st degree AV block  May 09, 2022 ECG (independently read by me): Sinus bradycardia at 55, 1st degree AV block, PR 248 msec  November 20, 2021 ECG (independently read by me): Sinus bradycardia at 56, 1st degree AV block; PR 246 msec  September 04, 2021 ECG (independently read by me): Sinus bradycardia 55, PR 210 msec, inferior T wave changes and Q wave III, aVF,   January 09, 2021 ECG (independently read by me): Sinus bradycardia at 53; 1st degree block, PR 222 msec, borderline LVH  February 2021 ECG (independently read by me): Sinus bradycardia at 58 bpm, first-degree AV block with a PR interval at 244 ms.  No significant ST-T changes.  QTc interval 431 ms  December 2019 ECG (independently read by me): Sinus bradycardia at 56 bpm.  First-degree block with PR interval 264 ms.  QTc interval 443 ms  October 2017 ECG (independently read by me): Sinus bradycardia at 44 bpm with first degree AV block.  Q wave in leads III and F.  PR interval 224 ms.  June 2016 ECG (independently read by me): not done today due to recent treadmill nuclear  study  ECG (independently read by me): Sinus bradycardia at 45 beats per minute. Normal intervals. Q waves in lead 3 and small Q wave in aVF.  LABS:    Latest Ref Rng & Units 08/16/2022    1:20 PM 08/03/2022   10:21 AM 02/14/2022    1:30 PM  BMP  Glucose 70 - 99 mg/dL 100  114  127   BUN 8 - 23 mg/dL 16  17  22    Creatinine 0.61 - 1.24 mg/dL 1.13  1.19  1.27   Sodium 135 - 145 mmol/L 139  139  132   Potassium 3.5 - 5.1 mmol/L 4.7  4.6  3.9   Chloride 98 - 111 mmol/L 106  104  101   CO2 22 - 32 mmol/L 28  25  23    Calcium 8.9 - 10.3 mg/dL 8.9  9.3  8.8       Latest Ref Rng & Units 08/16/2022    1:20 PM 08/03/2022   10:21 AM 02/14/2022    1:30 PM  Hepatic Function  Total Protein 6.5 - 8.1 g/dL 6.6  6.9  7.2   Albumin 3.5 - 5.0 g/dL 4.1  4.4  4.4   AST 15 - 41 U/L 28  28  27    ALT 0 - 44 U/L 25  26  23    Alk Phosphatase 38 - 126 U/L 69  81  77   Total Bilirubin 0.3 - 1.2 mg/dL 1.0  0.9  0.9       Latest Ref Rng & Units 10/15/2022   11:25 AM 08/16/2022    1:20 PM 08/03/2022   10:21 AM  CBC  WBC 4.0 - 10.5 K/uL  6.3  6.8   Hemoglobin 13.0 - 17.0 g/dL  16.5  16.8   Hematocrit 37.5 - 51.0 % 50.1  48.7  50.6   Platelets 150 - 400 K/uL  133  116.0    Lab Results  Component Value Date   MCV 93.3 08/16/2022   MCV 94.5 08/03/2022   MCV 92.6 02/14/2022   Lab Results  Component Value Date   TSH 1.50 01/03/2022   Lab Results  Component Value Date   HGBA1C 6.0 08/03/2022  Lipid Panel     Component Value Date/Time   CHOL 130 08/03/2022 1021   CHOL 167 12/17/2019 1113   TRIG 94.0 08/03/2022 1021   HDL 59.60 08/03/2022 1021   HDL 86 12/17/2019 1113   CHOLHDL 2 08/03/2022 1021   VLDL 18.8 08/03/2022 1021   LDLCALC 52 08/03/2022 1021   LDLCALC 65 12/17/2019 1113   LDLDIRECT 91.6 01/06/2014 1443    RADIOLOGY: No results found.  IMPRESSION: 1. OSA on CPAP   2. Coronary artery disease involving native coronary artery of native heart without angina pectoris   3. Hx of  CABG   4. Essential hypertension   5. First degree AV block   6. Centrilobular emphysema ([place])   7. Thrombocytopenia ([place])     ASSESSMENT AND PLAN: Christian Cole is a 72 year-old gentleman who has a history of CAD and underwent initial intervention in 1998. Due to progressive CAD he underwent CABG surgery in 2000. A nuclear perfusion study in 2013 remained normal without scar or ischemia.  A three-year follow-up nuclear perfusion study on 04/26/2015 continued to show entirely normal perfusion without scar or ischemia.  However, he developed asymptomatic ST depression during stress.  For this reason, it was interpreted as intermediate study.  Post-rest ejection fraction was 52%.  A repeat Myoview in July 2020 EF 55 to 60% was without ischemia or scar.  He underwent successful penile implant surgery without cardiovascular compromise.  Unfortunately he continued to smoke cigarettes and developed atypical chest pain symptomatology at his last evaluation.  A subsequent Lexiscan Myoview study on October 13, 2021 was unchanged and remained low risk without ischemia.  At his cardiology visit with me in January 2023, he was given cardiology clearance and underwent successful bilateral inguinal hernia surgery as well as umbilical surgery on January 11, 2022 without cardiovascular compromise.  His blood pressure has remained stable currently on his regimen of amlodipine 10 mg and benazepril 40 mg daily.  He is on atorvastatin 40 mg and Zetia 10 mg for hyperlipidemia.  Lipid studies in September 2023 showed total cholesterol 130 triglycerides 94 HDL 59 and LDL 52.  He has had stable mild thrombocytopenia with platelet count in October 2023 at 133.  He has not had any chest pain or change in exercise capacity.  He has documented centrilobular emphysema without focal consolidation noted on CT imaging.  He has continued to use CPAP therapy and since it is now been over 5 years since receiving his AirSense 10 CPAP unit  which did not have auto mode capability, he now qualifies for new machine.  He has been compliant with 100% use.  I will recommend he received a [employer] AirSense 11 AutoSet unit and we will set an EPR of 3 with pressure range at 15 to 20 cm of water.[employer] is his DME company.  I will see him in 3 - 4 months for follow-up evaluation or sooner as needed.   Troy Sine, MD, [facility]  02/13/2023 9:21 AM

## 2023-02-13 ENCOUNTER — Encounter: Payer: Self-pay | Admitting: Cardiovascular Disease

## 2023-02-18 ENCOUNTER — Encounter: Payer: Self-pay | Admitting: Oncology

## 2023-02-18 ENCOUNTER — Inpatient Hospital Stay: Payer: Medicare Other | Attending: Oncology

## 2023-02-18 ENCOUNTER — Inpatient Hospital Stay (HOSPITAL_BASED_OUTPATIENT_CLINIC_OR_DEPARTMENT_OTHER): Payer: Medicare Other | Admitting: Oncology

## 2023-02-18 ENCOUNTER — Inpatient Hospital Stay: Payer: Medicare Other

## 2023-02-18 VITALS — BP 139/70 | HR 62 | Resp 20

## 2023-02-18 VITALS — BP 136/71 | HR 70 | Temp 98.4°F | Resp 18 | Wt 216.8 lb

## 2023-02-18 DIAGNOSIS — Z800 Family history of malignant neoplasm of digestive organs: Secondary | ICD-10-CM | POA: Diagnosis not present

## 2023-02-18 DIAGNOSIS — D696 Thrombocytopenia, unspecified: Secondary | ICD-10-CM | POA: Insufficient documentation

## 2023-02-18 DIAGNOSIS — D751 Secondary polycythemia: Secondary | ICD-10-CM

## 2023-02-18 DIAGNOSIS — F17210 Nicotine dependence, cigarettes, uncomplicated: Secondary | ICD-10-CM | POA: Diagnosis not present

## 2023-02-18 LAB — CBC WITH DIFFERENTIAL/PLATELET
Abs Immature Granulocytes: 0.03 10*3/uL (ref 0.00–0.07)
Basophils Absolute: 0 10*3/uL (ref 0.0–0.1)
Basophils Relative: 0 %
Eosinophils Absolute: 0.1 10*3/uL (ref 0.0–0.5)
Eosinophils Relative: 2 %
HCT: 51.4 % (ref 39.0–52.0)
Hemoglobin: 17.4 g/dL — ABNORMAL HIGH (ref 13.0–17.0)
Immature Granulocytes: 0 %
Lymphocytes Relative: 37 %
Lymphs Abs: 3 10*3/uL (ref 0.7–4.0)
MCH: 32.3 pg (ref 26.0–34.0)
MCHC: 33.9 g/dL (ref 30.0–36.0)
MCV: 95.4 fL (ref 80.0–100.0)
Monocytes Absolute: 0.8 10*3/uL (ref 0.1–1.0)
Monocytes Relative: 10 %
Neutro Abs: 4.1 10*3/uL (ref 1.7–7.7)
Neutrophils Relative %: 51 %
Platelets: 141 10*3/uL — ABNORMAL LOW (ref 150–400)
RBC: 5.39 MIL/uL (ref 4.22–5.81)
RDW: 14.6 % (ref 11.5–15.5)
WBC: 8.2 10*3/uL (ref 4.0–10.5)
nRBC: 0 % (ref 0.0–0.2)

## 2023-02-18 NOTE — Assessment & Plan Note (Signed)
Secondary erythrocytosis,due to testosterone replacement therapy. Hematocrit is above 50.  Recommend therapeutic phlebotomy 500 cc x 1 today.

## 2023-02-18 NOTE — Assessment & Plan Note (Addendum)
Thrombocytopenia, likely ITP Previous work-up showed normal folate and vitamin B12 level, negative flowcytometry and monoclonal gammopathy workup. Hepatitis B surface antigen tested negative in 2019.  Hep C negative Lab Results  Component Value Date   PLT 141 (L) 02/18/2023   PLT 133 (L) 08/16/2022   PLT 116.0 (L) 08/03/2022    Platelet count has elevated likely due to recent acute inflammation secondary to skin biopsy.  Continue monitor.

## 2023-02-18 NOTE — Patient Instructions (Signed)

## 2023-02-18 NOTE — Progress Notes (Signed)
Hematology/Oncology Progress note Telephone:(336) [phone] Fax:(336) [phone]     CHIEF COMPLAINTS/REASON FOR VISIT:  Follow-up for thrombocytopenia and erythrocytosis   ASSESSMENT & PLAN:   Erythrocytosis Secondary erythrocytosis,due to testosterone replacement therapy. Hematocrit is above 50.  Recommend therapeutic phlebotomy 500 cc x 1 today.  Thrombocytopenia (HCC) Thrombocytopenia, likely ITP Previous work-up showed normal folate and vitamin B12 level, negative flowcytometry and monoclonal gammopathy workup. Hepatitis B surface antigen tested negative in 2019.  Hep C negative Lab Results  Component Value Date   PLT 141 (L) 02/18/2023   PLT 133 (L) 08/16/2022   PLT 116.0 (L) 08/03/2022    Platelet count has elevated likely due to recent acute inflammation secondary to skin biopsy.  Continue monitor.  Orders Placed This Encounter  Procedures   CBC with Differential (Cancer Center Only)    Standing Status:   Future    Standing Expiration Date:   02/18/2024   Follow up in 6 months All questions were answered. The patient knows to call the clinic with any problems, questions or concerns.  Rickard PatienceZhou Marvalene Barrett, MD, PhD [facility] 02/18/2023     HISTORY OF PRESENTING ILLNESS:  Christian Mountainerry W Cantin is a 72 y.o. male who was seen in consultation at the request of [name], French Anaracy * for evaluation of thrombocytopenia and erythrocytosis.    Reviewed patient's labs done previously.  12/21/2020 labs showed decreased platelet counts at 1 39,000. wbc 8.4 hemoglobin 18.3. Reviewed patient's previous labs. Thrombocytopenia is chronic chronic onset , since at least 2015. No aggravating or elevated factors. Erythrocytosis, started after patient was started on testosterone replacement therapy. He previously never had erythrocytosis sent to them. Associated symptoms or signs:  Denies weight loss, fever, chills, fatigue, night sweats.  Denies hematochezia, hematuria,  hematemesis, epistaxis, black tarry stool.  easy bruising.   Patient drinks 3 beers a day.  Sleep apnea, patient reports being compliant with CPAP machine 07/19/2022, ultrasound kidney showed hypoechoic kidney mass.  Patient follows with urologist and was recommended to observation and follow-up with additional imaging in the future.  INTERVAL HISTORY Christian Cole is a 72 y.o. male who has above history reviewed by me today presents for follow up visit for management of erythrocytosis and thrombocytopenia. Patient has no new complaints.  He is on testosterone replacement therapy. Patient had a recent skin biopsy on his nose.   Review of Systems  Constitutional:  Negative for appetite change, chills, fatigue, fever and unexpected weight change.  HENT:   Negative for hearing loss and voice change.   Eyes:  Negative for eye problems and icterus.  Respiratory:  Negative for chest tightness, cough and shortness of breath.   Cardiovascular:  Negative for chest pain and leg swelling.  Gastrointestinal:  Negative for abdominal distention and abdominal pain.  Endocrine: Negative for hot flashes.  Genitourinary:  Negative for difficulty urinating, dysuria and frequency.   Musculoskeletal:  Negative for arthralgias.  Skin:  Negative for itching and rash.  Neurological:  Negative for light-headedness and numbness.  Hematological:  Negative for adenopathy. Does not bruise/bleed easily.  Psychiatric/Behavioral:  Negative for confusion.     MEDICAL HISTORY:  Past Medical History:  Diagnosis Date   Aortic atherosclerosis    BPH (benign prostatic hyperplasia)    CAD (coronary artery disease)    a. s/p CABG in 2000 with LIMA-LAD, RIMA-PDA, SVG-D1, and SVG-PLA b. low-risk NST in 2016   Carotid artery stenosis    Cyst of right kidney    ED (erectile  dysfunction)    a.) penile implant in place   Fatty liver    GERD (gastroesophageal reflux disease)    Hyperlipidemia    Hypertension    Long  term current use of antithrombotics/antiplatelets    a.) clopidogrel   Low testosterone    a.) on exogenous TRT   OSA on CPAP    Pre-diabetes    S/P CABG x 4 2000   a.) LIMA-LAD, RIMA-PDA, SVG-D1, SVG-PLA   Secondary erythrocytosis    a.) smoking + TRT + OSAH   Sinus bradycardia    Skin cancer    Thrombocytopenia    Tongue lesion    benign from trauma as of 06/20/21    SURGICAL HISTORY: Past Surgical History:  Procedure Laterality Date   CATARACT EXTRACTION, BILATERAL  2010   COLONOSCOPY WITH PROPOFOL N/A 04/15/2020   Procedure: COLONOSCOPY WITH PROPOFOL;  Surgeon: Earline Mayotte, MD;  Location: [facility] ENDOSCOPY;  Service: Endoscopy;  Laterality: N/A;   CORONARY ARTERY BYPASS GRAFT  2000   LIMA to the LAD,RIMA to the PDA,vein graft to the diagonal & vein graft to the PLA   ESOPHAGOGASTRODUODENOSCOPY     Dr. Marva Panda   ESOPHAGOGASTRODUODENOSCOPY  04/15/2020   Procedure: ESOPHAGOGASTRODUODENOSCOPY (EGD);  Surgeon: Earline Mayotte, MD;  Location: [facility] ENDOSCOPY;  Service: Endoscopy;;   HOLEP-LASER ENUCLEATION OF THE PROSTATE WITH MORCELLATION N/A 11/27/2021   Procedure: HOLEP-LASER ENUCLEATION OF THE PROSTATE WITH MORCELLATION;  Surgeon: Vanna Scotland, MD;  Location: [facility] ORS;  Service: Urology;  Laterality: N/A;   INGUINAL HERNIA REPAIR Left 01/11/2022   Procedure: HERNIA REPAIR INGUINAL ADULT;  Surgeon: Henrene Dodge, MD;  Location: [facility] ORS;  Service: General;  Laterality: Left;   KNEE ARTHROSCOPY Right 1991   KNEE DEBRIDEMENT Right 1989   Fluid flush   PENILE PROSTHESIS IMPLANT     06/26/19 Dr. Guy Sandifer   SHOULDER ARTHROSCOPY Left 2011   UMBILICAL HERNIA REPAIR N/A 01/11/2022   Procedure: HERNIA REPAIR UMBILICAL ADULT;  Surgeon: Henrene Dodge, MD;  Location: [facility] ORS;  Service: General;  Laterality: N/A;   US ECHOCARDIOGRAPHY  12/12/2010   mild LA dilatation, normal LV systolic fx, EF > 55%   XI ROBOTIC ASSISTED INGUINAL HERNIA REPAIR WITH MESH Right 01/11/2022   Procedure: XI  ROBOTIC ASSISTED INGUINAL HERNIA REPAIR WITH MESH;  Surgeon: Henrene Dodge, MD;  Location: [facility] ORS;  Service: General;  Laterality: Right;    SOCIAL HISTORY: Social History   Socioeconomic History   Marital status: Married    Spouse name: Not on file   Number of children: 3   Years of education: 16   Highest education level: Not on file  Occupational History   Occupation: Retired Electronics engineer   Occupation: Management/Technical Support    Comment: Educational psychologist  Tobacco Use   Smoking status: Every Day    Packs/day: 0.50    Years: 44.00    Additional pack years: 0.00    Total pack years: 22.00    Types: Cigarettes   Smokeless tobacco: Never   Tobacco comments:    on and off smoker   Vaping Use   Vaping Use: Never used  Substance and Sexual Activity   Alcohol use: Yes    Alcohol/week: 10.0 standard drinks of alcohol    Types: 10 Cans of beer per week    Comment: Down to 3 beers a day from 4-6 a day    Drug use: No   Sexual activity: Yes    Partners: Female    Comment:  Wife  Other Topics Concern   Not on file  Social History Narrative   Tyriq grew up in [place]. He is currently living in [place] with his wife. This is his second marriage. He has 1 daughter and 2 sons from his first marriage. He has 4 step kids. His daughter lives in [place], one son in [place], [place] and the other son in [place], [place]. 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       He worked Designer, industrial/product for a [employer]    He enjoys wood working on his spare time. 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 He also enjoys shooting sports - target shooting and [employer] Her[employer]).       He retired 04/2019 and enjoys this       2 brothers and 2 sisters he is next to youngest    Social Determinants of Corporate investment banker Strain: Not on file  Food Insecurity: Not on file  Transportation Needs: Not on file  Physical Activity: Not on file  Stress: Not on file   Social Connections: Not on file  Intimate Partner Violence: Not on file    FAMILY HISTORY: Family History  Problem Relation Age of Onset   Arthritis Mother    Heart disease Father        CAD - MI   Stroke Father    Hypertension Father    Cancer Father 66       esophageal cancer    Arthritis Maternal Grandmother     ALLERGIES:  is allergic to tamsulosin.  MEDICATIONS:  Current Outpatient Medications  Medication Sig Dispense Refill   amLODipine (NORVASC) 10 MG tablet Take 1 tablet (10 mg total) by mouth every evening. 90 tablet 3   atorvastatin (LIPITOR) 40 MG tablet TAKE 1 TABLET(40 MG) BY MOUTH DAILY AT NIGHT 90 tablet 3   benazepril (LOTENSIN) 40 MG tablet Take 1 tablet (40 mg total) by mouth every evening. 90 tablet 3   clopidogrel (PLAVIX) 75 MG tablet Take 1 tablet (75 mg total) by mouth every evening. 90 tablet 3   ezetimibe (ZETIA) 10 MG tablet TAKE 1 TABLET(10 MG) BY MOUTH DAILY 90 tablet 3   ibuprofen (ADVIL) 200 MG tablet Take 400 mg by mouth every 8 (eight) hours as needed (pain.).     Multiple Vitamins-Minerals (MULTIVITAMIN WITH MINERALS) tablet Take 1 tablet by mouth every evening. Men 50+     NEEDLE, DISP, 18 G (B-D HYPODERMIC NEEDLE 18GX1.5") 18G X 1-1/2" MISC DRW UP MEDICATION WITH THIS NEEDLE 45 each 3   pantoprazole (PROTONIX) 40 MG tablet Take 1 tablet (40 mg total) by mouth every evening. TAKE 1 TABLET BY MOUTH EVERY DAY 30 MINUTES BEFORE A MEAL 90 tablet 3   SYRINGE-NEEDLE, DISP, 3 ML 23G X 1" 3 ML MISC 1.5 mg by Does not apply route every 14 (fourteen) days. 45 each 3   testosterone cypionate (DEPOTESTOSTERONE CYPIONATE) 200 MG/ML injection ADMINISTER 1.5 ML(300 MG) IN THE MUSCLE EVERY 14 DAYS 10 mL 0   No current facility-administered medications for this visit.     PHYSICAL EXAMINATION: ECOG PERFORMANCE STATUS: 0 - Asymptomatic Vitals:   02/18/23 1338  BP: 136/71  Pulse: 70  Resp: 18  Temp: 98.4 F (36.9 C)   Filed Weights   02/18/23 1338   Weight: 216 lb 12.8 oz (98.3 kg)    Physical Exam Constitutional:      General: He is not  in acute distress. HENT:     Head: Normocephalic and atraumatic.  Eyes:     General: No scleral icterus. Cardiovascular:     Rate and Rhythm: Normal rate and regular rhythm.     Heart sounds: Normal heart sounds.  Pulmonary:     Effort: Pulmonary effort is normal. No respiratory distress.     Breath sounds: No wheezing.  Abdominal:     General: Bowel sounds are normal. There is no distension.     Palpations: Abdomen is soft.  Musculoskeletal:        General: No deformity. Normal range of motion.     Cervical back: Normal range of motion and neck supple.  Skin:    General: Skin is warm and dry.     Findings: No erythema or rash.  Neurological:     Mental Status: He is alert and oriented to person, place, and time. Mental status is at baseline.     Cranial Nerves: No cranial nerve deficit.     Coordination: Coordination normal.  Psychiatric:        Mood and Affect: Mood normal.      LABORATORY DATA:  I have reviewed the data as listed Lab Results  Component Value Date   WBC 8.2 02/18/2023   HGB 17.4 (H) 02/18/2023   HCT 51.4 02/18/2023   MCV 95.4 02/18/2023   PLT 141 (L) 02/18/2023   Recent Labs    08/03/22 1021 08/16/22 1320  NA 139 139  K 4.6 4.7  CL 104 106  CO2 25 28  GLUCOSE 114* 100*  BUN 17 16  CREATININE 1.19 1.13  CALCIUM 9.3 8.9  GFRNONAA  --  >60  PROT 6.9 6.6  ALBUMIN 4.4 4.1  AST 28 28  ALT 26 25  ALKPHOS 81 69  BILITOT 0.9 1.0    Iron/TIBC/Ferritin/ %Sat No results found for: "IRON", "TIBC", "FERRITIN", "IRONPCTSAT"    RADIOGRAPHIC STUDIES: I have personally reviewed the radiological images as listed and agreed with the findings in the report.  No results found.

## 2023-02-20 ENCOUNTER — Encounter: Payer: Self-pay | Admitting: Family Medicine

## 2023-02-20 ENCOUNTER — Ambulatory Visit (INDEPENDENT_AMBULATORY_CARE_PROVIDER_SITE_OTHER): Payer: Medicare Other | Admitting: Family Medicine

## 2023-02-20 VITALS — BP 130/70 | HR 68 | Temp 97.8°F | Ht 71.0 in | Wt 216.0 lb

## 2023-02-20 DIAGNOSIS — I10 Essential (primary) hypertension: Secondary | ICD-10-CM | POA: Diagnosis not present

## 2023-02-20 DIAGNOSIS — E559 Vitamin D deficiency, unspecified: Secondary | ICD-10-CM | POA: Diagnosis not present

## 2023-02-20 DIAGNOSIS — R7303 Prediabetes: Secondary | ICD-10-CM | POA: Diagnosis not present

## 2023-02-20 DIAGNOSIS — K2100 Gastro-esophageal reflux disease with esophagitis, without bleeding: Secondary | ICD-10-CM

## 2023-02-20 DIAGNOSIS — E785 Hyperlipidemia, unspecified: Secondary | ICD-10-CM | POA: Diagnosis not present

## 2023-02-20 DIAGNOSIS — I2510 Atherosclerotic heart disease of native coronary artery without angina pectoris: Secondary | ICD-10-CM

## 2023-02-20 DIAGNOSIS — R7989 Other specified abnormal findings of blood chemistry: Secondary | ICD-10-CM | POA: Diagnosis not present

## 2023-02-20 DIAGNOSIS — D696 Thrombocytopenia, unspecified: Secondary | ICD-10-CM | POA: Diagnosis not present

## 2023-02-20 NOTE — Patient Instructions (Addendum)
It was a pleasure meeting you today. Thank you for allowing me to take part in your health care.  Our goals for today as we discussed include:  Schedule Medicare Annual Wellness Visit   Recommend Shingles vaccine.  This is a 2 dose series and can be given at your local pharmacy.  Please talk to your pharmacist about this.    Schedule fasting lab appointment 1 week before next visit Follow up with PCP in 4-6 weeks   If you have any questions or concerns, please do not hesitate to call the office at [phone].  I look forward to our next visit and until then take care and stay safe.  Regards,   Dana Allan, MD   [employer] Recommend Tetanus Vaccination.  This is given every 10 years.

## 2023-02-20 NOTE — Progress Notes (Signed)
SUBJECTIVE:   Chief Complaint  Patient presents with   Transitions Of Care   HPI Presents to clinic for transfer care.  No acute concerns today.  Hypertension Asymptomatic.  Takes Norvasc 10 mg daily, benazepril 40 mg daily and tolerating well.  Denies any headaches, visual changes, chest pain, shortness of breath or lower extremity edema  CAD/hyperlipidemia History of PCI 1998.  Had CABG revascularization surgery in 2000.  Since that time he has been on atorvastatin 40 mg, Zetia 10 mg daily and Plavix 75 mg daily.  Is doing well on current medication.  Follows with Dr. Tresa Endo, cardiology.  Low testosterone On testosterone therapy, 300 mg IM q. 2 weeks.  Follows with urology.  Chronic thrombocytopenia Follows with oncology, Dr. Cathie Hoops.  Likely ITP.  Denies any signs of bleeding.  GERD Controlled with current PPI.  EGD on 6/21 shows chronic gastritis, reflux esophagitis and chronic duodenitis.    PERTINENT PMH / PSH: Hyperlipidemia Hypertension CAD status post PCI in 1998 and CABG revascularization in 2000 Hepatic steatosis Chronic ITP GERD  OBJECTIVE:  BP 130/70   Pulse 68   Temp 97.8 F (36.6 C) (Oral)   Ht 5\' 11"  (1.803 m)   Wt 216 lb (98 kg)   SpO2 97%   BMI 30.13 kg/m    Physical Exam Vitals reviewed.  Constitutional:      General: He is not in acute distress.    Appearance: He is normal weight. He is not ill-appearing.  HENT:     Head: Normocephalic.  Eyes:     Conjunctiva/sclera: Conjunctivae normal.  Neck:     Thyroid: No thyromegaly or thyroid tenderness.  Cardiovascular:     Rate and Rhythm: Normal rate and regular rhythm.     Pulses: Normal pulses.  Pulmonary:     Effort: Pulmonary effort is normal.     Breath sounds: Normal breath sounds.  Abdominal:     General: Bowel sounds are normal.  Neurological:     Mental Status: He is alert. Mental status is at baseline.  Psychiatric:        Mood and Affect: Mood normal.        Behavior:  Behavior normal.        Thought Content: Thought content normal.        Judgment: Judgment normal.     ASSESSMENT/PLAN:  Primary hypertension Assessment & Plan: Chronic.  Well-controlled. Continue amlodipine 10 mg daily Continue benazepril 40 mg daily Monitor blood pressure at home.  Goal less than 140/90 per JNC 8 guidelines for age Blood work at next visit   Orders: -     Comprehensive metabolic panel; Future  Prediabetes Assessment & Plan: Check A1c at next visit  Orders: -     Hemoglobin A1c; Future  Vitamin D deficiency -     VITAMIN D 25 Hydroxy (Vit-D Deficiency, Fractures); Future  Hyperlipidemia, unspecified hyperlipidemia type -     Lipid panel; Future  Low testosterone in male Assessment & Plan: Chronic.  On testosterone therapy injections every 2 weeks. Follows with urology   Thrombocytopenia Assessment & Plan: Chronic.  Asymptomatic. Follows with oncology, Dr. Cathie Hoops   Coronary artery disease involving native coronary artery of native heart without angina pectoris Assessment & Plan: Chronic. History of PCI 1998, CABG revascularization 2000 Takes Plavix 75 mg daily for preventative therapy.  Prescribed by cardiology Continue atorvastatin 40 mg daily Continue Zetia 10 mg daily Check fasting lipids   Gastroesophageal reflux disease with esophagitis without hemorrhage  Assessment & Plan: Chronic. Gastritis, reflux esophagitis and chronic duodenitis noted on EGD, 06/21. Continue Protonix 40 mg daily Limit use of NSAIDs.    PDMP reviewed  Return in about 5 weeks (around 03/27/2023) for PCP.  Dana Allan, MD

## 2023-02-25 ENCOUNTER — Encounter: Payer: Self-pay | Admitting: Family Medicine

## 2023-02-25 DIAGNOSIS — C44311 Basal cell carcinoma of skin of nose: Secondary | ICD-10-CM | POA: Diagnosis not present

## 2023-02-25 NOTE — Assessment & Plan Note (Signed)
Chronic.  Well-controlled. Continue amlodipine 10 mg daily Continue benazepril 40 mg daily Monitor blood pressure at home.  Goal less than 140/90 per JNC 8 guidelines for age Blood work at next visit

## 2023-02-25 NOTE — Assessment & Plan Note (Signed)
Chronic. History of PCI 1998, CABG revascularization 2000 Takes Plavix 75 mg daily for preventative therapy.  Prescribed by cardiology Continue atorvastatin 40 mg daily Continue Zetia 10 mg daily Check fasting lipids

## 2023-02-25 NOTE — Assessment & Plan Note (Signed)
Chronic.  On testosterone therapy injections every 2 weeks. Follows with urology

## 2023-02-25 NOTE — Assessment & Plan Note (Signed)
Check A1c at next visit.  

## 2023-02-25 NOTE — Assessment & Plan Note (Signed)
Chronic.  Asymptomatic. Follows with oncology, Dr. Cathie Hoops

## 2023-02-25 NOTE — Assessment & Plan Note (Signed)
Chronic. Gastritis, reflux esophagitis and chronic duodenitis noted on EGD, 06/21. Continue Protonix 40 mg daily Limit use of NSAIDs.

## 2023-02-25 NOTE — Assessment & Plan Note (Deleted)
Check A1c at next visit.  

## 2023-03-05 ENCOUNTER — Other Ambulatory Visit: Payer: Self-pay | Admitting: Acute Care

## 2023-03-05 DIAGNOSIS — Z122 Encounter for screening for malignant neoplasm of respiratory organs: Secondary | ICD-10-CM

## 2023-03-05 DIAGNOSIS — F17210 Nicotine dependence, cigarettes, uncomplicated: Secondary | ICD-10-CM

## 2023-03-05 DIAGNOSIS — Z87891 Personal history of nicotine dependence: Secondary | ICD-10-CM

## 2023-03-12 ENCOUNTER — Encounter: Payer: Self-pay | Admitting: Oncology

## 2023-03-15 ENCOUNTER — Ambulatory Visit
Admission: RE | Admit: 2023-03-15 | Discharge: 2023-03-15 | Disposition: A | Discharge status: Home / Self Care | Payer: Medicare Other | Source: Ambulatory Visit | Attending: Family Medicine | Admitting: Family Medicine

## 2023-03-15 DIAGNOSIS — Z87891 Personal history of nicotine dependence: Secondary | ICD-10-CM | POA: Insufficient documentation

## 2023-03-15 DIAGNOSIS — Z122 Encounter for screening for malignant neoplasm of respiratory organs: Secondary | ICD-10-CM | POA: Diagnosis not present

## 2023-03-15 DIAGNOSIS — F17210 Nicotine dependence, cigarettes, uncomplicated: Secondary | ICD-10-CM | POA: Diagnosis not present

## 2023-03-20 ENCOUNTER — Other Ambulatory Visit: Payer: Medicare Other

## 2023-03-20 ENCOUNTER — Other Ambulatory Visit: Payer: Self-pay | Admitting: Acute Care

## 2023-03-20 DIAGNOSIS — Z122 Encounter for screening for malignant neoplasm of respiratory organs: Secondary | ICD-10-CM

## 2023-03-20 DIAGNOSIS — F17210 Nicotine dependence, cigarettes, uncomplicated: Secondary | ICD-10-CM

## 2023-03-20 DIAGNOSIS — Z87891 Personal history of nicotine dependence: Secondary | ICD-10-CM

## 2023-03-21 ENCOUNTER — Other Ambulatory Visit (INDEPENDENT_AMBULATORY_CARE_PROVIDER_SITE_OTHER): Payer: Medicare Other

## 2023-03-21 DIAGNOSIS — I10 Essential (primary) hypertension: Secondary | ICD-10-CM

## 2023-03-21 DIAGNOSIS — E559 Vitamin D deficiency, unspecified: Secondary | ICD-10-CM | POA: Diagnosis not present

## 2023-03-21 DIAGNOSIS — E785 Hyperlipidemia, unspecified: Secondary | ICD-10-CM | POA: Diagnosis not present

## 2023-03-21 DIAGNOSIS — R7303 Prediabetes: Secondary | ICD-10-CM | POA: Diagnosis not present

## 2023-03-21 LAB — COMPREHENSIVE METABOLIC PANEL
ALT: 18 U/L (ref 0–53)
AST: 21 U/L (ref 0–37)
Albumin: 4.2 g/dL (ref 3.5–5.2)
Alkaline Phosphatase: 67 U/L (ref 39–117)
BUN: 15 mg/dL (ref 6–23)
CO2: 25 mEq/L (ref 19–32)
Calcium: 9 mg/dL (ref 8.4–10.5)
Chloride: 104 mEq/L (ref 96–112)
Creatinine, Ser: 1.16 mg/dL (ref 0.40–1.50)
GFR: 62.66 mL/min (ref >60.00)
Glucose, Bld: 101 mg/dL — ABNORMAL HIGH (ref 70–99)
Potassium: 4.4 mEq/L (ref 3.5–5.1)
Sodium: 139 mEq/L (ref 135–145)
Total Bilirubin: 0.8 mg/dL (ref 0.2–1.2)
Total Protein: 6.5 g/dL (ref 6.0–8.3)

## 2023-03-21 LAB — HEMOGLOBIN A1C: Hgb A1c MFr Bld: 5.9 % (ref 4.6–6.5)

## 2023-03-21 LAB — LIPID PANEL
Cholesterol: 135 mg/dL (ref 0–200)
HDL: 65.7 mg/dL (ref >39.00)
LDL Cholesterol: 57 mg/dL (ref 0–99)
NonHDL: 69.51
Total CHOL/HDL Ratio: 2
Triglycerides: 64 mg/dL (ref 0.0–149.0)
VLDL: 12.8 mg/dL (ref 0.0–40.0)

## 2023-03-21 LAB — VITAMIN D 25 HYDROXY (VIT D DEFICIENCY, FRACTURES): VITD: 30.64 ng/mL (ref 30.00–100.00)

## 2023-03-27 ENCOUNTER — Encounter: Payer: Self-pay | Admitting: Family Medicine

## 2023-03-27 ENCOUNTER — Ambulatory Visit (INDEPENDENT_AMBULATORY_CARE_PROVIDER_SITE_OTHER): Payer: Medicare Other | Admitting: Family Medicine

## 2023-03-27 VITALS — BP 132/68 | HR 75 | Ht 71.0 in | Wt 216.0 lb

## 2023-03-27 DIAGNOSIS — R7303 Prediabetes: Secondary | ICD-10-CM | POA: Diagnosis not present

## 2023-03-27 DIAGNOSIS — I700 Atherosclerosis of aorta: Secondary | ICD-10-CM

## 2023-03-27 DIAGNOSIS — D490 Neoplasm of unspecified behavior of digestive system: Secondary | ICD-10-CM | POA: Diagnosis not present

## 2023-03-27 DIAGNOSIS — E785 Hyperlipidemia, unspecified: Secondary | ICD-10-CM | POA: Diagnosis not present

## 2023-03-27 DIAGNOSIS — J432 Centrilobular emphysema: Secondary | ICD-10-CM

## 2023-03-27 DIAGNOSIS — I77810 Thoracic aortic ectasia: Secondary | ICD-10-CM | POA: Diagnosis not present

## 2023-03-27 DIAGNOSIS — I10 Essential (primary) hypertension: Secondary | ICD-10-CM

## 2023-03-27 DIAGNOSIS — I2510 Atherosclerotic heart disease of native coronary artery without angina pectoris: Secondary | ICD-10-CM | POA: Diagnosis not present

## 2023-03-27 NOTE — Patient Instructions (Addendum)
It was a pleasure seeing you today. Thank you for allowing me to take part in your health care.  Our goals for today as we discussed include:  Blood pressure is good today. Continue current medications  Schedule Medicare Annual Wellness Visit   Recommend Shingles vaccine.  This is a 2 dose series and can be given at your local pharmacy.  Please talk to your pharmacist about this.   Recommend Tetanus Vaccination.  This is given every 10 years.   Follow up in 6 months  If you have any questions or concerns, please do not hesitate to call the office at [phone].  I look forward to our next visit and until then take care and stay safe.  Regards,   Dana Allan, MD   [facility]

## 2023-03-27 NOTE — Progress Notes (Addendum)
SUBJECTIVE:   Chief Complaint  Patient presents with   Medical Management of Chronic Issues   HPI Presents to clinic for follow-up chronic disease management  No acute concerns today.  Hypertension Doing well.  Asymptomatic.  Compliant with Norvasc 10 mg daily, BenzePrO 40 mg daily and tolerating medications well.  CAD/hyperlipidemia Asymptomatic.  Continues to be compliant with atorvastatin 40 mg, Zetia 10 mg and Plavix 75 mg daily.  Denies any signs of bleeding, chest pain or shortness of breath.  Continues to follow with cardiology.  Low testosterone Follows with urology.  Tolerating testosterone 300 mg IM every 14 days.  Has upcoming urology appointment for blood work.  PERTINENT PMH / PSH: Hyperlipidemia Hypertension CAD status post PCI in 1998 and CABG revascularization in 2000 Chronic ITP GERD  OBJECTIVE:  BP 132/68   Pulse 75   Ht 5\' 11"  (1.803 m)   Wt 216 lb (98 kg)   SpO2 98%   BMI 30.13 kg/m    Physical Exam Vitals reviewed.  Constitutional:      General: He is not in acute distress.    Appearance: Normal appearance. He is normal weight. He is not ill-appearing, toxic-appearing or diaphoretic.  HENT:     Head: Normocephalic.     Right Ear: Tympanic membrane, ear canal and external ear normal.     Left Ear: Tympanic membrane, ear canal and external ear normal.  Eyes:     General:        Right eye: No discharge.        Left eye: No discharge.  Neck:     Thyroid: No thyromegaly or thyroid tenderness.  Cardiovascular:     Rate and Rhythm: Normal rate and regular rhythm.     Heart sounds: Normal heart sounds.  Pulmonary:     Effort: Pulmonary effort is normal.     Breath sounds: Normal breath sounds.  Abdominal:     General: Bowel sounds are normal.  Musculoskeletal:        General: Normal range of motion.     Cervical back: Normal range of motion.  Skin:    General: Skin is warm and dry.  Neurological:     Mental Status: He is alert and  oriented to person, place, and time. Mental status is at baseline.  Psychiatric:        Mood and Affect: Mood normal.        Behavior: Behavior normal.        Thought Content: Thought content normal.        Judgment: Judgment normal.     ASSESSMENT/PLAN:  Primary hypertension Assessment & Plan: Chronic.  Well-controlled. Continue amlodipine 10 mg daily Continue benazepril 40 mg daily Monitor blood pressure at home.  Goal less than 140/90 per JNC 8 guidelines for age Recent creatinine within normal limits    Prediabetes Assessment & Plan: Chronic.  Stable.  Asymptomatic. Recent A1c 5.9 Encourage healthy lifestyle and increase activity as tolerated Fasting lipids annually   Hyperlipidemia, unspecified hyperlipidemia type Assessment & Plan: Current.  Recent LDL at goal. Continue Zetia 10 mg daily Continue atorvastatin 40 mg daily    Coronary artery disease involving native coronary artery of native heart without angina pectoris Assessment & Plan: Chronic.  Asymptomatic Takes Plavix 75 mg daily for preventative therapy.  No sign of bleeding.  Prescribed by cardiology On statin and Zetia   Aortic atherosclerosis (HCC) Assessment & Plan: Chronic.  Noted on CT chest 03/15/2023.  On statin therapy  and Zetia.   Centrilobular emphysema (HCC) Assessment & Plan: Chronic.  Noted on CT chest 03/15/2023. Not currently on therapy Asymptomatic. Annual low-dose CT chest for continued tobacco use Can refer to pulmonology if becomes symptomatic   IPMN (intraductal papillary mucinous neoplasm) Assessment & Plan: Recent CT abd ordered by urology showing. Unchanged 6 mm hypodensity in the pancreatic uncinate process compared to 02/07/2022, likely a side branch IPMN. Recommend follow up pre and post contrast MRI/MRCP or pancreatic protocol CT in 2 years. 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  History of   CABG Reviewed results with patient  Encouraged smoking cessation Maintain BP control Follows with cardiology.    HCM Medicare annual wellness due Recommend shingles vaccine Recommend tetanus booster Low-dose CT chest due 05/25.  History of current tobacco use. Colonoscopy up-to-date.  History of polyps.  Recommended 5-year follow-up.  Due 06/27. Hepatitis C screening completed Pneumonia vaccination series completed  PDMP reviewed  Return in about 6 months (around 09/27/2023) for PCP.  Dana Allan, MD

## 2023-03-29 ENCOUNTER — Other Ambulatory Visit: Payer: Self-pay | Admitting: Urology

## 2023-04-01 ENCOUNTER — Other Ambulatory Visit: Payer: Self-pay

## 2023-04-01 DIAGNOSIS — K219 Gastro-esophageal reflux disease without esophagitis: Secondary | ICD-10-CM

## 2023-04-01 MED ORDER — PANTOPRAZOLE SODIUM 40 MG PO TBEC: 40.0000 mg | DELAYED_RELEASE_TABLET | Freq: Every evening | ORAL | 3 refills | Status: DC

## 2023-04-12 DIAGNOSIS — J439 Emphysema, unspecified: Secondary | ICD-10-CM | POA: Insufficient documentation

## 2023-04-12 DIAGNOSIS — I700 Atherosclerosis of aorta: Secondary | ICD-10-CM | POA: Insufficient documentation

## 2023-04-12 DIAGNOSIS — I77810 Thoracic aortic ectasia: Secondary | ICD-10-CM | POA: Insufficient documentation

## 2023-04-12 DIAGNOSIS — D490 Neoplasm of unspecified behavior of digestive system: Secondary | ICD-10-CM | POA: Insufficient documentation

## 2023-04-12 NOTE — Assessment & Plan Note (Addendum)
Noted on CT chest lung 03/15/2023.  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  History of   CABG Reviewed results with patient  Encouraged smoking cessation Maintain BP control Follows with cardiology.

## 2023-04-12 NOTE — Assessment & Plan Note (Addendum)
Chronic.  Asymptomatic Takes Plavix 75 mg daily for preventative therapy.  No sign of bleeding.  Prescribed by cardiology On statin and Zetia

## 2023-04-12 NOTE — Assessment & Plan Note (Signed)
Chronic.  Noted on CT chest 03/15/2023.  On statin therapy and Zetia.

## 2023-04-12 NOTE — Assessment & Plan Note (Signed)
Chronic.  Noted on CT chest 03/15/2023. Not currently on therapy Asymptomatic. Annual low-dose CT chest for continued tobacco use Can refer to pulmonology if becomes symptomatic

## 2023-04-12 NOTE — Assessment & Plan Note (Signed)
Chronic.  Well-controlled. Continue amlodipine 10 mg daily Continue benazepril 40 mg daily Monitor blood pressure at home.  Goal less than 140/90 per JNC 8 guidelines for age Recent creatinine within normal limits

## 2023-04-12 NOTE — Assessment & Plan Note (Signed)
Current.  Recent LDL at goal. Continue Zetia 10 mg daily Continue atorvastatin 40 mg daily

## 2023-04-12 NOTE — Assessment & Plan Note (Deleted)
Chronic.  Stable.  Asymptomatic. Recent A1c 5.9 Encourage healthy lifestyle and increase activity as tolerated Fasting lipids annually 

## 2023-04-12 NOTE — Assessment & Plan Note (Signed)
Recent CT abd ordered by urology showing. Unchanged 6 mm hypodensity in the pancreatic uncinate process compared to 02/07/2022, likely a side branch IPMN. Recommend follow up pre and post contrast MRI/MRCP or pancreatic protocol CT in 2 years. Will discuss with patient at next visit

## 2023-04-12 NOTE — Assessment & Plan Note (Signed)
Chronic.  Stable.  Asymptomatic. Recent A1c 5.9 Encourage healthy lifestyle and increase activity as tolerated Fasting lipids annually

## 2023-04-15 ENCOUNTER — Other Ambulatory Visit: Payer: Medicare Other

## 2023-04-15 ENCOUNTER — Other Ambulatory Visit: Payer: Self-pay | Admitting: *Deleted

## 2023-04-15 DIAGNOSIS — E291 Testicular hypofunction: Secondary | ICD-10-CM

## 2023-04-15 DIAGNOSIS — R972 Elevated prostate specific antigen [PSA]: Secondary | ICD-10-CM

## 2023-04-15 DIAGNOSIS — Z125 Encounter for screening for malignant neoplasm of prostate: Secondary | ICD-10-CM

## 2023-04-16 LAB — HEMATOCRIT: Hematocrit: 48.7 % (ref 37.5–51.0)

## 2023-04-16 LAB — PSA: Prostate Specific Ag, Serum: 0.7 ng/mL (ref 0.0–4.0)

## 2023-04-16 LAB — TESTOSTERONE: Testosterone: 438 ng/dL (ref 264–916)

## 2023-04-17 ENCOUNTER — Encounter: Payer: Self-pay | Admitting: Urology

## 2023-04-17 ENCOUNTER — Ambulatory Visit (INDEPENDENT_AMBULATORY_CARE_PROVIDER_SITE_OTHER): Payer: Medicare Other | Admitting: Urology

## 2023-04-17 VITALS — BP 146/74 | HR 62 | Ht 71.0 in | Wt 213.0 lb

## 2023-04-17 DIAGNOSIS — E291 Testicular hypofunction: Secondary | ICD-10-CM | POA: Diagnosis not present

## 2023-04-17 DIAGNOSIS — K862 Cyst of pancreas: Secondary | ICD-10-CM

## 2023-04-17 DIAGNOSIS — N2889 Other specified disorders of kidney and ureter: Secondary | ICD-10-CM

## 2023-04-17 NOTE — Progress Notes (Signed)
I, Duke Salvia, acting as a Neurosurgeon for Christian Altes, MD., have documented all relevant documentation on the behalf of Christian Altes, MD, as directed by  Christian Altes, MD while in the presence of Christian Altes, MD.   04/17/2023 12:37 PM   Christian Cole 06-06-1951 [id number]  Referring provider: [name], Pasty Spillers, MD No address on file  Chief Complaint  Patient presents with   Hypogonadism    Urologic history: 1.  Hypogonadism TRT testosterone cypionate   2.  Erectile dysfunction Penile prosthesis; [facility] 06/2019  3.  BPH with LUTS HoLEP Dr. Apolinar Junes 11/17/2021 Benign pathology  4.  Indeterminant right renal lesion 1.5 cm exophytic lesion right kidney MRI 01/2022 felt consistent with hemorrhagic cyst however had significant motion artifact causing questionable mural enhancement   HPI: 72 y.o. male presents for follow-up visit.  Doing well since last visit No bothersome LUTS Denies dysuria, gross hematuria Denies flank, abdominal or pelvic pain  Labs 04/15/2023 testosterone 438; PSA 0.7, hematocrit 48.7 Renal mass protocol CT 01/18/23 showed resolution of the previously seen indeterminate right renal mass felt secondary to cyst rupture Also noted incidental 6 mm hypodensity in the pancreatic uncinate process; a follow up MRI/MRCP or pancreatic protocol CT was recommended in 2 years   PMH: Past Medical History:  Diagnosis Date   Aortic atherosclerosis (HCC)    BPH (benign prostatic hyperplasia)    CAD (coronary artery disease)    a. s/p CABG in 2000 with LIMA-LAD, RIMA-PDA, SVG-D1, and SVG-PLA b. low-risk NST in 2016   Carotid artery stenosis    Cyst of right kidney    ED (erectile dysfunction)    a.) penile implant in place   Fatty liver    GERD (gastroesophageal reflux disease)    Hyperlipidemia    Hypertension    Long term current use of antithrombotics/antiplatelets    a.) clopidogrel   Low testosterone    a.) on exogenous TRT   OSA on  CPAP    Pre-diabetes    S/P CABG x 4 2000   a.) LIMA-LAD, RIMA-PDA, SVG-D1, SVG-PLA   Secondary erythrocytosis    a.) smoking + TRT + OSAH   Sinus bradycardia    Skin cancer    Thrombocytopenia (HCC)    Tongue lesion    benign from trauma as of 06/20/21    Surgical History: Past Surgical History:  Procedure Laterality Date   CATARACT EXTRACTION, BILATERAL  2010   COLONOSCOPY WITH PROPOFOL N/A 04/15/2020   Procedure: COLONOSCOPY WITH PROPOFOL;  Surgeon: Earline Mayotte, MD;  Location: [facility] ENDOSCOPY;  Service: Endoscopy;  Laterality: N/A;   CORONARY ARTERY BYPASS GRAFT  2000   LIMA to the LAD,RIMA to the PDA,vein graft to the diagonal & vein graft to the PLA   ESOPHAGOGASTRODUODENOSCOPY     Dr. Marva Panda   ESOPHAGOGASTRODUODENOSCOPY  04/15/2020   Procedure: ESOPHAGOGASTRODUODENOSCOPY (EGD);  Surgeon: Earline Mayotte, MD;  Location: [facility] ENDOSCOPY;  Service: Endoscopy;;   HOLEP-LASER ENUCLEATION OF THE PROSTATE WITH MORCELLATION N/A 11/27/2021   Procedure: HOLEP-LASER ENUCLEATION OF THE PROSTATE WITH MORCELLATION;  Surgeon: Vanna Scotland, MD;  Location: [facility] ORS;  Service: Urology;  Laterality: N/A;   INGUINAL HERNIA REPAIR Left 01/11/2022   Procedure: HERNIA REPAIR INGUINAL ADULT;  Surgeon: Henrene Dodge, MD;  Location: [facility] ORS;  Service: General;  Laterality: Left;   KNEE ARTHROSCOPY Right 1991   KNEE DEBRIDEMENT Right 1989   Fluid flush   PENILE PROSTHESIS IMPLANT  06/26/19 Dr. Guy Sandifer   SHOULDER ARTHROSCOPY Left 2011   UMBILICAL HERNIA REPAIR N/A 01/11/2022   Procedure: HERNIA REPAIR UMBILICAL ADULT;  Surgeon: Henrene Dodge, MD;  Location: [facility] ORS;  Service: General;  Laterality: N/A;   US ECHOCARDIOGRAPHY  12/12/2010   mild LA dilatation, normal LV systolic fx, EF > 55%   XI ROBOTIC ASSISTED INGUINAL HERNIA REPAIR WITH MESH Right 01/11/2022   Procedure: XI ROBOTIC ASSISTED INGUINAL HERNIA REPAIR WITH MESH;  Surgeon: Henrene Dodge, MD;  Location: [facility] ORS;  Service:  General;  Laterality: Right;    Home Medications:  Allergies as of 04/17/2023   No Known Allergies      Medication List        Accurate as of April 17, 2023 12:37 PM. If you have any questions, ask your nurse or doctor.          amLODipine 10 MG tablet Commonly known as: NORVASC Take 1 tablet (10 mg total) by mouth every evening.   atorvastatin 40 MG tablet Commonly known as: LIPITOR TAKE 1 TABLET(40 MG) BY MOUTH DAILY AT NIGHT   B-D 3CC LUER-LOK SYR 23GX1-1/2 23G X 1-1/2" 3 ML Misc Generic drug: SYRINGE-NEEDLE (DISP) 3 ML 1 each by Other route as directed.   B-D HYPODERMIC NEEDLE 18GX1.5" 18G X 1-1/2" Misc Generic drug: NEEDLE (DISP) 18 G DRAW UP MEDICATION WITH THIS NEEDLE AS DIRECTED   benazepril 40 MG tablet Commonly known as: LOTENSIN Take 1 tablet (40 mg total) by mouth every evening.   clopidogrel 75 MG tablet Commonly known as: PLAVIX Take 1 tablet (75 mg total) by mouth every evening.   ezetimibe 10 MG tablet Commonly known as: ZETIA TAKE 1 TABLET(10 MG) BY MOUTH DAILY   ibuprofen 200 MG tablet Commonly known as: ADVIL Take 400 mg by mouth every 8 (eight) hours as needed (pain.).   multivitamin with minerals tablet Take 1 tablet by mouth every evening. Men 50+   pantoprazole 40 MG tablet Commonly known as: PROTONIX Take 1 tablet (40 mg total) by mouth every evening. TAKE 1 TABLET BY MOUTH EVERY DAY 30 MINUTES BEFORE A MEAL   testosterone cypionate 200 MG/ML injection Commonly known as: DEPOTESTOSTERONE CYPIONATE ADMINISTER 1.5 ML(300 MG) IN THE MUSCLE EVERY 14 DAYS         Family History: Family History  Problem Relation Age of Onset   Arthritis Mother    Heart disease Father        CAD - MI   Stroke Father    Hypertension Father    Cancer Father 11       esophageal cancer    Arthritis Maternal Grandmother     Social History:  reports that he has been smoking cigarettes. He has a 22.00 pack-year smoking history. He has never used  smokeless tobacco. He reports current alcohol use of about 10.0 standard drinks of alcohol per week. He reports that he does not use drugs.   Physical Exam: BP (!) 146/74   Pulse 62   Ht 5\' 11"  (1.803 m)   Wt 213 lb (96.6 kg)   BMI 29.71 kg/m   Constitutional:  Alert and oriented, No acute distress. HEENT:  AT, moist mucus membranes.  Trachea midline, no masses. Respiratory: Normal respiratory effort, no increased work of breathing. GI: Abdomen is soft, nontender, nondistended, no abdominal masses Psychiatric: Normal mood and affect.   Assessment & Plan:    1.  Hypogonadism Stable. 6 month lab visit, testosterone. 1 year office visit with testosterone/PSA.  Hematocrit is followed by hematology.  2. Indeterminate right renal mass Not present on recent CT and most likely a ruptured cyst.  3. Pancreatic cyst Radiology recommended follow-up MRI/MRCP versus pancreatic protocol CT in 2 years.  Will message PCP   I have reviewed the above documentation for accuracy and completeness, and I agree with the above.   Christian Altes, MD  [facility] [address] [place], [place] [address] [phone]

## 2023-04-19 ENCOUNTER — Telehealth: Payer: Self-pay | Admitting: Family Medicine

## 2023-04-19 NOTE — Telephone Encounter (Signed)
er

## 2023-04-23 ENCOUNTER — Encounter: Payer: Self-pay | Admitting: Family Medicine

## 2023-04-23 ENCOUNTER — Ambulatory Visit (INDEPENDENT_AMBULATORY_CARE_PROVIDER_SITE_OTHER): Payer: Medicare Other | Admitting: Family Medicine

## 2023-04-23 VITALS — BP 122/76 | HR 60 | Temp 97.7°F | Ht 71.0 in | Wt 217.4 lb

## 2023-04-23 DIAGNOSIS — D490 Neoplasm of unspecified behavior of digestive system: Secondary | ICD-10-CM

## 2023-04-23 DIAGNOSIS — J439 Emphysema, unspecified: Secondary | ICD-10-CM

## 2023-04-23 NOTE — Progress Notes (Signed)
   SUBJECTIVE:   Chief Complaint  Patient presents with   Medical Management of Chronic Issues    CT Results   HPI Patient resents to clinic for discussion of recent abdominal CT results.  Patient recently seen by urology and had CT abdomen for follow-up right renal mass.  Was noted to have an unchanged 6 mm hypodensity area in the pancreatic uncinate process compared with previous imaging 01/2022.  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  Recommendation to follow-up in 2 years with pre and postcontrast MRI/MRCP or pancreatic protocol CT.  Reviewed results with patient and okay with follow-up in 2 years.  Currently asymptomatic.  If increases in size will refer to GI at that time.  PERTINENT PMH / PSH: Aortic atherosclerosis Emphysema   OBJECTIVE:  BP 122/76   Pulse 60   Temp 97.7 F (36.5 C)   Ht 5\' 11"  (1.803 m)   Wt 217 lb 6.4 oz (98.6 kg)   SpO2 96%   BMI 30.32 kg/m    Physical Exam Vitals reviewed.  Constitutional:      General: He is not in acute distress.    Appearance: Normal appearance. He is obese. He is not ill-appearing, toxic-appearing or diaphoretic.  Eyes:     General:        Right eye: No discharge.        Left eye: No discharge.  Cardiovascular:     Rate and Rhythm: Normal rate and regular rhythm.     Heart sounds: Normal heart sounds.  Pulmonary:     Effort: Pulmonary effort is normal.     Breath sounds: Normal breath sounds.  Abdominal:     General: Bowel sounds are normal.  Musculoskeletal:        General: Normal range of motion.     Cervical back: Normal range of motion.  Skin:    General: Skin is warm and dry.  Neurological:     Mental Status: He is alert and oriented to person, place, and time. Mental status is at baseline.  Psychiatric:        Mood and Affect: Mood normal.        Behavior: Behavior normal.        Thought Content: Thought content normal.        Judgment: Judgment normal.     ASSESSMENT/PLAN:  IPMN (intraductal  papillary mucinous neoplasm) Assessment & Plan: Recent CT abd ordered by urology showing unchanged 6 mm hypodensity in the pancreatic uncinate process compared to 02/07/2022, likely a side branch IPMN. Recommend follow up pre and post contrast MRI/MRCP or pancreatic protocol CT in 2 years. Reviewed recent CT results with patient.  Okay for follow-up in 2 years. If becomes symptomatic or increase in size on follow-up will refer to GI.     PDMP reviewed  Return in about 5 months (around 09/30/2023) for PCP.  Dana Allan, MD

## 2023-04-23 NOTE — Patient Instructions (Addendum)
It was a pleasure meeting you today. Thank you for allowing me to take part in your health care.  Our goals for today as we discussed include:  Plan for follow up of lesion on pancrease in 2 years with imaging  Recommend Shingles vaccine.  This is a 2 dose series and can be given at your local pharmacy.  Please talk to your pharmacist about this.   Recommend Tetanus Vaccination.  This is given every 10 years.   Schedule Medicare Annual Wellness Visit   Follow up with PCP as scheduled  If you have any questions or concerns, please do not hesitate to call the office at [phone].  I look forward to our next visit and until then take care and stay safe.  Regards,   Dana Allan, MD   [facility]

## 2023-04-28 ENCOUNTER — Encounter: Payer: Self-pay | Admitting: Family Medicine

## 2023-04-28 NOTE — Assessment & Plan Note (Signed)
Recent CT abd ordered by urology showing unchanged 6 mm hypodensity in the pancreatic uncinate process compared to 02/07/2022, likely a side branch IPMN. Recommend follow up pre and post contrast MRI/MRCP or pancreatic protocol CT in 2 years. Reviewed recent CT results with patient.  Okay for follow-up in 2 years. If becomes symptomatic or increase in size on follow-up will refer to GI.

## 2023-05-07 ENCOUNTER — Encounter: Payer: Self-pay | Admitting: Urology

## 2023-05-08 ENCOUNTER — Other Ambulatory Visit: Payer: Self-pay | Admitting: Urology

## 2023-05-08 MED ORDER — TESTOSTERONE CYPIONATE 200 MG/ML IM SOLN: INTRAMUSCULAR | 0 refills | Status: DC

## 2023-05-09 DIAGNOSIS — Z85828 Personal history of other malignant neoplasm of skin: Secondary | ICD-10-CM | POA: Diagnosis not present

## 2023-05-14 ENCOUNTER — Encounter: Payer: Self-pay | Admitting: Cardiovascular Disease

## 2023-05-24 ENCOUNTER — Telehealth: Payer: Self-pay | Admitting: Cardiovascular Disease

## 2023-05-24 NOTE — Telephone Encounter (Signed)
Spoke with pt, discussed with Dr Royann Shivers, patient aware it does not sound cardiac. He will try the ibuprofen to see if that helps. He is aware of ER precautions and will call prior to his follow up appointment if symptoms worsen or change. Pt agreed with this plan.

## 2023-05-24 NOTE — Telephone Encounter (Signed)
Pt c/o of Chest Pain: STAT if CP now or developed within 24 hours  1. Are you having CP right now? No  2. Are you experiencing any other symptoms (ex. SOB, nausea, vomiting, sweating)? No  3. How long have you been experiencing CP? Since July 10th  4. Is your CP continuous or coming and going? Coming and going   5. Have you taken Nitroglycerin? No   ?

## 2023-05-24 NOTE — Telephone Encounter (Signed)
Spoke with pt, on 05/22/23 he was working on trying to get an engine to start and got pretty aggressive with pulling the chord. He then developed left sided chest pain that was dull and did not radiate. He reports it went away after resting, it maybe took 5 min, he is not sure. Today he has a feeling that something is not right. He has a dull pain to the left of his sternum, no SOB or other symptoms. He does report he has been walking in and out of the hospital because his wife just had surgery and he will not have pain. The pain does not increase or change with movement or taking a deep breath. He has a follow up appointment with dr Tresa Endo 06/05/23. Will discuss with the DOD.

## 2023-06-05 ENCOUNTER — Ambulatory Visit: Payer: Medicare Other | Admitting: Cardiovascular Disease

## 2023-06-05 DIAGNOSIS — G4733 Obstructive sleep apnea (adult) (pediatric): Secondary | ICD-10-CM

## 2023-06-26 ENCOUNTER — Encounter: Payer: Self-pay | Admitting: Family Medicine

## 2023-06-27 ENCOUNTER — Other Ambulatory Visit: Payer: Self-pay

## 2023-06-27 DIAGNOSIS — I10 Essential (primary) hypertension: Secondary | ICD-10-CM

## 2023-06-27 DIAGNOSIS — E785 Hyperlipidemia, unspecified: Secondary | ICD-10-CM

## 2023-06-27 DIAGNOSIS — I2510 Atherosclerotic heart disease of native coronary artery without angina pectoris: Secondary | ICD-10-CM

## 2023-06-27 DIAGNOSIS — Z951 Presence of aortocoronary bypass graft: Secondary | ICD-10-CM

## 2023-06-27 MED ORDER — ATORVASTATIN CALCIUM 40 MG PO TABS: ORAL_TABLET | ORAL | 3 refills | Status: DC

## 2023-06-27 MED ORDER — CLOPIDOGREL BISULFATE 75 MG PO TABS: 75.0000 mg | ORAL_TABLET | Freq: Every evening | ORAL | 3 refills | Status: DC

## 2023-06-27 MED ORDER — BENAZEPRIL HCL 40 MG PO TABS: 40.0000 mg | ORAL_TABLET | Freq: Every evening | ORAL | 3 refills | Status: DC

## 2023-06-27 MED ORDER — EZETIMIBE 10 MG PO TABS
ORAL_TABLET | ORAL | 3 refills | Status: DC
Start: 2023-06-27 — End: 2024-03-30

## 2023-06-27 MED ORDER — AMLODIPINE BESYLATE 10 MG PO TABS: 10.0000 mg | ORAL_TABLET | Freq: Every evening | ORAL | 3 refills | Status: DC

## 2023-06-27 NOTE — Telephone Encounter (Signed)
Requesting refill

## 2023-06-27 NOTE — Telephone Encounter (Signed)
Rxs sent to provider

## 2023-07-08 ENCOUNTER — Encounter: Payer: Self-pay | Admitting: Cardiovascular Disease

## 2023-07-08 ENCOUNTER — Encounter: Payer: Self-pay | Admitting: Urology

## 2023-07-08 MED ORDER — "BD LUER-LOK SYRINGE 23G X 1-1/2"" 3 ML MISC": 5 refills | Status: DC

## 2023-07-26 DIAGNOSIS — D485 Neoplasm of uncertain behavior of skin: Secondary | ICD-10-CM | POA: Diagnosis not present

## 2023-07-26 DIAGNOSIS — C44622 Squamous cell carcinoma of skin of right upper limb, including shoulder: Secondary | ICD-10-CM | POA: Diagnosis not present

## 2023-07-26 DIAGNOSIS — D0461 Carcinoma in situ of skin of right upper limb, including shoulder: Secondary | ICD-10-CM | POA: Diagnosis not present

## 2023-07-26 DIAGNOSIS — D225 Melanocytic nevi of trunk: Secondary | ICD-10-CM | POA: Diagnosis not present

## 2023-07-26 DIAGNOSIS — L821 Other seborrheic keratosis: Secondary | ICD-10-CM | POA: Diagnosis not present

## 2023-07-26 DIAGNOSIS — B078 Other viral warts: Secondary | ICD-10-CM | POA: Diagnosis not present

## 2023-07-26 DIAGNOSIS — D2221 Melanocytic nevi of right ear and external auricular canal: Secondary | ICD-10-CM | POA: Diagnosis not present

## 2023-07-26 DIAGNOSIS — L570 Actinic keratosis: Secondary | ICD-10-CM | POA: Diagnosis not present

## 2023-08-05 ENCOUNTER — Encounter: Payer: Self-pay | Admitting: Urology

## 2023-08-05 ENCOUNTER — Other Ambulatory Visit: Payer: Self-pay | Admitting: Urology

## 2023-08-20 ENCOUNTER — Inpatient Hospital Stay (HOSPITAL_BASED_OUTPATIENT_CLINIC_OR_DEPARTMENT_OTHER): Payer: Medicare Other | Admitting: Oncology

## 2023-08-20 ENCOUNTER — Inpatient Hospital Stay: Payer: Medicare Other

## 2023-08-20 ENCOUNTER — Encounter: Payer: Self-pay | Admitting: Oncology

## 2023-08-20 ENCOUNTER — Inpatient Hospital Stay: Payer: Medicare Other | Attending: Oncology

## 2023-08-20 VITALS — BP 131/73 | HR 56

## 2023-08-20 VITALS — BP 117/66 | HR 58 | Temp 98.6°F | Resp 18 | Ht 71.0 in | Wt 213.5 lb

## 2023-08-20 DIAGNOSIS — D751 Secondary polycythemia: Secondary | ICD-10-CM

## 2023-08-20 DIAGNOSIS — Z800 Family history of malignant neoplasm of digestive organs: Secondary | ICD-10-CM | POA: Diagnosis not present

## 2023-08-20 DIAGNOSIS — K862 Cyst of pancreas: Secondary | ICD-10-CM | POA: Insufficient documentation

## 2023-08-20 DIAGNOSIS — F17210 Nicotine dependence, cigarettes, uncomplicated: Secondary | ICD-10-CM | POA: Diagnosis not present

## 2023-08-20 DIAGNOSIS — D696 Thrombocytopenia, unspecified: Secondary | ICD-10-CM | POA: Diagnosis not present

## 2023-08-20 LAB — CBC WITH DIFFERENTIAL (CANCER CENTER ONLY)
Abs Immature Granulocytes: 0.04 10*3/uL (ref 0.00–0.07)
Basophils Absolute: 0 10*3/uL (ref 0.0–0.1)
Basophils Relative: 0 %
Eosinophils Absolute: 0.1 10*3/uL (ref 0.0–0.5)
Eosinophils Relative: 2 %
HCT: 50.2 % (ref 39.0–52.0)
Hemoglobin: 16.7 g/dL (ref 13.0–17.0)
Immature Granulocytes: 1 %
Lymphocytes Relative: 37 %
Lymphs Abs: 2.7 10*3/uL (ref 0.7–4.0)
MCH: 31.2 pg (ref 26.0–34.0)
MCHC: 33.3 g/dL (ref 30.0–36.0)
MCV: 93.7 fL (ref 80.0–100.0)
Monocytes Absolute: 0.8 10*3/uL (ref 0.1–1.0)
Monocytes Relative: 12 %
Neutro Abs: 3.6 10*3/uL (ref 1.7–7.7)
Neutrophils Relative %: 48 %
Platelet Count: 126 10*3/uL — ABNORMAL LOW (ref 150–400)
RBC: 5.36 MIL/uL (ref 4.22–5.81)
RDW: 15 % (ref 11.5–15.5)
WBC Count: 7.3 10*3/uL (ref 4.0–10.5)
nRBC: 0 % (ref 0.0–0.2)

## 2023-08-20 NOTE — Addendum Note (Signed)
Addended by: Rickard Patience on: 08/20/2023 04:49 PM   Modules accepted: Orders

## 2023-08-20 NOTE — Assessment & Plan Note (Signed)
Likely IPMN, monitor. Repeat CT in March 2026

## 2023-08-20 NOTE — Patient Instructions (Signed)

## 2023-08-20 NOTE — Assessment & Plan Note (Signed)
Secondary erythrocytosis,due to testosterone replacement therapy. Hematocrit is above 50.  Recommend therapeutic phlebotomy 500 cc x 1 today.

## 2023-08-20 NOTE — Assessment & Plan Note (Signed)
Thrombocytopenia, likely ITP Previous work-up showed normal folate and vitamin B12 level, negative flowcytometry and monoclonal gammopathy workup. Hepatitis B surface antigen tested negative in 2019.  Hep C negative Lab Results  Component Value Date   PLT 126 (L) 08/20/2023   PLT 141 (L) 02/18/2023   PLT 133 (L) 08/16/2022   Continue monitor.

## 2023-08-20 NOTE — Progress Notes (Signed)
Hematology/Oncology Progress note [phone] [phone]) [phone]     CHIEF COMPLAINTS/REASON FOR VISIT:  Follow-up for thrombocytopenia and erythrocytosis   ASSESSMENT & PLAN:   Secondary erythrocytosis Secondary erythrocytosis,due to testosterone replacement therapy. Hematocrit is above 50.  Recommend therapeutic phlebotomy 500 cc x 1 today.  Thrombocytopenia (HCC) Thrombocytopenia, likely ITP Previous work-up showed normal folate and vitamin B12 level, negative flowcytometry and monoclonal gammopathy workup. Hepatitis B surface antigen tested negative in 2019.  Hep C negative Lab Results  Component Value Date   PLT 126 (L) 08/20/2023   PLT 141 (L) 02/18/2023   PLT 133 (L) 08/16/2022   Continue monitor.  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 Repeat CT in March 2026  Orders Placed This Encounter  Procedures   CBC with Differential (Cancer Center Only)    Standing Status:   Future    Standing Expiration Date:   08/19/2024   Vitamin B12    Standing Status:   Future    Standing Expiration Date:   08/19/2024   Follow up in 6 months All questions were answered. The patient knows to call the clinic with any problems, questions or concerns.  Rickard Patience, MD, PhD [facility] Hematology Oncology 08/20/2023     HISTORY OF PRESENTING ILLNESS:  Christian Cole is a 72 y.o. male who was seen in consultation at the request of No ref. provider found for evaluation of thrombocytopenia and erythrocytosis.    Reviewed patient's labs done previously.  12/21/2020 labs showed decreased platelet counts at 1 39,000. wbc 8.4 hemoglobin 18.3. Reviewed patient's previous labs. Thrombocytopenia is chronic chronic onset , since at least 2015. No aggravating or elevated factors. Erythrocytosis, started after patient was started on testosterone replacement therapy. He previously never had erythrocytosis sent to them. Associated symptoms or signs:  Denies weight loss, fever,  chills, fatigue, night sweats.  Denies hematochezia, hematuria, hematemesis, epistaxis, black tarry stool.  easy bruising.   Patient drinks 3 beers a day.  Sleep apnea, patient reports being compliant with CPAP machine 07/19/2022, ultrasound kidney showed hypoechoic kidney mass.  Patient follows with urologist and was recommended to observation and follow-up with additional imaging in the future.  INTERVAL HISTORY Christian Cole is a 72 y.o. male who has above history reviewed by me today presents for follow up visit for management of erythrocytosis and thrombocytopenia. Patient has no new complaints.  He is on testosterone replacement therapy.  Review of Systems  Constitutional:  Negative for appetite change, chills, fatigue, fever and unexpected weight change.  HENT:   Negative for hearing loss and voice change.   Eyes:  Negative for eye problems and icterus.  Respiratory:  Negative for chest tightness, cough and shortness of breath.   Cardiovascular:  Negative for chest pain and leg swelling.  Gastrointestinal:  Negative for abdominal distention and abdominal pain.  Endocrine: Negative for hot flashes.  Genitourinary:  Negative for difficulty urinating, dysuria and frequency.   Musculoskeletal:  Negative for arthralgias.  Skin:  Negative for itching and rash.  Neurological:  Negative for light-headedness and numbness.  Hematological:  Negative for adenopathy. Does not bruise/bleed easily.  Psychiatric/Behavioral:  Negative for confusion.     MEDICAL HISTORY:  Past Medical History:  Diagnosis Date   Aortic atherosclerosis (HCC)    BPH (benign prostatic hyperplasia)    CAD (coronary artery disease)    a. s/p CABG in 2000 with LIMA-LAD, RIMA-PDA, SVG-D1, and SVG-PLA b. low-risk NST in 2016   Carotid artery stenosis  Cyst of right kidney    ED (erectile dysfunction)    a.) penile implant in place   Fatty liver    GERD (gastroesophageal reflux disease)    Hyperlipidemia     Hypertension    Long term current use of antithrombotics/antiplatelets    a.) clopidogrel   Low testosterone    a.) on exogenous TRT   OSA on CPAP    Pre-diabetes    S/P CABG x 4 2000   a.) LIMA-LAD, RIMA-PDA, SVG-D1, SVG-PLA   Secondary erythrocytosis    a.) smoking + TRT + OSAH   Sinus bradycardia    Skin cancer    Thrombocytopenia (HCC)    Tongue lesion    benign from trauma as of 06/20/21    SURGICAL HISTORY: Past Surgical History:  Procedure Laterality Date   CATARACT EXTRACTION, BILATERAL  2010   COLONOSCOPY WITH PROPOFOL N/A 04/15/2020   Procedure: COLONOSCOPY WITH PROPOFOL;  Surgeon: Earline Mayotte, MD;  Location: [facility] ENDOSCOPY;  Service: Endoscopy;  Laterality: N/A;   CORONARY ARTERY BYPASS GRAFT  2000   LIMA to the LAD,RIMA to the PDA,vein graft to the diagonal & vein graft to the PLA   ESOPHAGOGASTRODUODENOSCOPY     Dr. Marva Panda   ESOPHAGOGASTRODUODENOSCOPY  04/15/2020   Procedure: ESOPHAGOGASTRODUODENOSCOPY (EGD);  Surgeon: Earline Mayotte, MD;  Location: [facility] ENDOSCOPY;  Service: Endoscopy;;   HOLEP-LASER ENUCLEATION OF THE PROSTATE WITH MORCELLATION N/A 11/27/2021   Procedure: HOLEP-LASER ENUCLEATION OF THE PROSTATE WITH MORCELLATION;  Surgeon: Vanna Scotland, MD;  Location: [facility] ORS;  Service: Urology;  Laterality: N/A;   INGUINAL HERNIA REPAIR Left 01/11/2022   Procedure: HERNIA REPAIR INGUINAL ADULT;  Surgeon: Henrene Dodge, MD;  Location: [facility] ORS;  Service: General;  Laterality: Left;   KNEE ARTHROSCOPY Right 1991   KNEE DEBRIDEMENT Right 1989   Fluid flush   PENILE PROSTHESIS IMPLANT     06/26/19 Dr. Guy Sandifer   SHOULDER ARTHROSCOPY Left 2011   UMBILICAL HERNIA REPAIR N/A 01/11/2022   Procedure: HERNIA REPAIR UMBILICAL ADULT;  Surgeon: Henrene Dodge, MD;  Location: [facility] ORS;  Service: General;  Laterality: N/A;   US ECHOCARDIOGRAPHY  12/12/2010   mild LA dilatation, normal LV systolic fx, EF > 55%   XI ROBOTIC ASSISTED INGUINAL HERNIA REPAIR WITH MESH  Right 01/11/2022   Procedure: XI ROBOTIC ASSISTED INGUINAL HERNIA REPAIR WITH MESH;  Surgeon: Henrene Dodge, MD;  Location: [facility] ORS;  Service: General;  Laterality: Right;    SOCIAL HISTORY: Social History   Socioeconomic History   Marital status: Married    Spouse name: Not on file   Number of children: 3   Years of education: 16   Highest education level: Bachelor's degree (e.g., BA, AB, BS)  Occupational History   Occupation: Retired Electronics engineer   Occupation: Management/Technical Support    Comment: Educational psychologist  Tobacco Use   Smoking status: Every Day    Current packs/day: 0.50    Average packs/day: 0.5 packs/day for 44.0 years (22.0 ttl pk-yrs)    Types: Cigarettes   Smokeless tobacco: Never   Tobacco comments:    on and off smoker   Vaping Use   Vaping status: Never Used  Substance and Sexual Activity   Alcohol use: Yes    Alcohol/week: 10.0 standard drinks of alcohol    Types: 10 Cans of beer per week    Comment: Down to 3 beers a day from 4-6 a day    Drug use: No   Sexual activity: Yes  Partners: Female    Comment: Wife  Other Topics Concern   Not on file  Social History Narrative   Raymund grew up in [place]. He is currently living in [place] with his wife. This is his second marriage. He has 1 daughter and 2 sons from his first marriage. He has 4 step kids. His daughter lives in [place], one son in [place], [place] and the other son in [place], [place]. 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       He worked Designer, industrial/product for a [employer]    He enjoys wood working on his spare time. 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 He also enjoys shooting sports - target shooting and [employer] Herbalist).       He retired 04/2019 and enjoys this       2 brothers and 2 sisters he is next to youngest    Social Determinants of Corporate investment banker Strain: Low Risk  (04/22/2023)   Overall Financial Resource Strain (CARDIA)     Difficulty of Paying Living Expenses: Not hard at all  Food Insecurity: No Food Insecurity (04/22/2023)   Hunger Vital Sign    Worried About Running Out of Food in the Last Year: Never true    Ran Out of Food in the Last Year: Never true  Transportation Needs: No Transportation Needs (04/22/2023)   PRAPARE - Administrator, Civil Service (Medical): No    Lack of Transportation (Non-Medical): No  Physical Activity: Unknown (04/22/2023)   Exercise Vital Sign    Days of Exercise per Week: Patient declined    Minutes of Exercise per Session: 0 min  Recent Concern: Physical Activity - Inactive (03/27/2023)   Exercise Vital Sign    Days of Exercise per Week: 0 days    Minutes of Exercise per Session: 0 min  Stress: No Stress Concern Present (04/22/2023)   [employer] of Occupational Health - Occupational Stress Questionnaire    Feeling of Stress : Not at all  Social Connections: Moderately Isolated (04/22/2023)   Social Connection and Isolation Panel [NHANES]    Frequency of Communication with Friends and Family: More than three times a week    Frequency of Social Gatherings with Friends and Family: Once a week    Attends Religious Services: Never    Database administrator or Organizations: No    Attends Banker Meetings: Never    Marital Status: Married  Catering manager Violence: Not At Risk (03/27/2023)   Humiliation, Afraid, Rape, and Kick questionnaire    Fear of Current or Ex-Partner: No    Emotionally Abused: No    Physically Abused: No    Sexually Abused: No    FAMILY HISTORY: Family History  Problem Relation Age of Onset   Arthritis Mother    Heart disease Father        CAD - MI   Stroke Father    Hypertension Father    Cancer Father 12       esophageal cancer    Arthritis Maternal Grandmother     ALLERGIES:  has No Known Allergies.  MEDICATIONS:  Current Outpatient Medications  Medication Sig Dispense Refill   amLODipine (NORVASC) 10 MG  tablet Take 1 tablet (10 mg total) by mouth every evening. 90 tablet 3   atorvastatin (LIPITOR) 40 MG tablet TAKE 1 TABLET(40 MG) BY MOUTH DAILY AT NIGHT 90 tablet 3   B-D 3CC  LUER-LOK SYR 23GX1-1/2 23G X 1-1/2" 3 ML MISC Use once every 14 days 50 each 5   benazepril (LOTENSIN) 40 MG tablet Take 1 tablet (40 mg total) by mouth every evening. 90 tablet 3   clopidogrel (PLAVIX) 75 MG tablet Take 1 tablet (75 mg total) by mouth every evening. 90 tablet 3   ezetimibe (ZETIA) 10 MG tablet TAKE 1 TABLET(10 MG) BY MOUTH DAILY 90 tablet 3   ibuprofen (ADVIL) 200 MG tablet Take 400 mg by mouth every 8 (eight) hours as needed (pain.).     Multiple Vitamins-Minerals (MULTIVITAMIN WITH MINERALS) tablet Take 1 tablet by mouth every evening. Men 50+     NEEDLE, DISP, 18 G (B-D HYPODERMIC NEEDLE 18GX1.5") 18G X 1-1/2" MISC DRAW UP MEDICATION WITH THIS NEEDLE AS DIRECTED 30 each 0   pantoprazole (PROTONIX) 40 MG tablet Take 1 tablet (40 mg total) by mouth every evening. TAKE 1 TABLET BY MOUTH EVERY DAY 30 MINUTES BEFORE A MEAL 90 tablet 3   testosterone cypionate (DEPOTESTOSTERONE CYPIONATE) 200 MG/ML injection ADMINISTER 1.5 ML(300 MG) IN THE MUSCLE EVERY 14 DAYS 10 mL 0   No current facility-administered medications for this visit.     PHYSICAL EXAMINATION: ECOG PERFORMANCE STATUS: 0 - Asymptomatic Vitals:   08/20/23 1307  BP: 117/66  Pulse: (!) 58  Resp: 18  Temp: 98.6 F (37 C)   Filed Weights   08/20/23 1307  Weight: 213 lb 8 oz (96.8 kg)    Physical Exam Constitutional:      General: He is not in acute distress. HENT:     Head: Normocephalic and atraumatic.  Eyes:     General: No scleral icterus. Cardiovascular:     Rate and Rhythm: Normal rate and regular rhythm.     Heart sounds: Normal heart sounds.  Pulmonary:     Effort: Pulmonary effort is normal. No respiratory distress.     Breath sounds: No wheezing.  Abdominal:     General: Bowel sounds are normal. There is no distension.      Palpations: Abdomen is soft.  Musculoskeletal:        General: No deformity. Normal range of motion.     Cervical back: Normal range of motion and neck supple.  Skin:    General: Skin is warm and dry.     Findings: No erythema or rash.  Neurological:     Mental Status: He is alert and oriented to person, place, and time. Mental status is at baseline.     Cranial Nerves: No cranial nerve deficit.     Coordination: Coordination normal.  Psychiatric:        Mood and Affect: Mood normal.      LABORATORY DATA:  I have reviewed the data as listed Lab Results  Component Value Date   WBC 7.3 08/20/2023   HGB 16.7 08/20/2023   HCT 50.2 08/20/2023   MCV 93.7 08/20/2023   PLT 126 (L) 08/20/2023   Recent Labs    03/21/23 1009  NA 139  K 4.4  CL 104  CO2 25  GLUCOSE 101*  BUN 15  CREATININE 1.16  CALCIUM 9.0  PROT 6.5  ALBUMIN 4.2  AST 21  ALT 18  ALKPHOS 67  BILITOT 0.8   Iron/TIBC/Ferritin/ %Sat No results found for: "IRON", "TIBC", "FERRITIN", "IRONPCTSAT"    RADIOGRAPHIC STUDIES: I have personally reviewed the radiological images as listed and agreed with the findings in the report.  No results found.

## 2023-08-22 ENCOUNTER — Encounter: Payer: Self-pay | Admitting: Cardiovascular Disease

## 2023-08-22 ENCOUNTER — Ambulatory Visit: Payer: Medicare Other | Attending: Cardiovascular Disease | Admitting: Cardiovascular Disease

## 2023-08-22 DIAGNOSIS — J432 Centrilobular emphysema: Secondary | ICD-10-CM | POA: Insufficient documentation

## 2023-08-22 DIAGNOSIS — I700 Atherosclerosis of aorta: Secondary | ICD-10-CM | POA: Insufficient documentation

## 2023-08-22 DIAGNOSIS — Z951 Presence of aortocoronary bypass graft: Secondary | ICD-10-CM | POA: Insufficient documentation

## 2023-08-22 DIAGNOSIS — G4733 Obstructive sleep apnea (adult) (pediatric): Secondary | ICD-10-CM | POA: Insufficient documentation

## 2023-08-22 DIAGNOSIS — I10 Essential (primary) hypertension: Secondary | ICD-10-CM | POA: Diagnosis not present

## 2023-08-22 DIAGNOSIS — I2510 Atherosclerotic heart disease of native coronary artery without angina pectoris: Secondary | ICD-10-CM | POA: Diagnosis not present

## 2023-08-22 DIAGNOSIS — I6523 Occlusion and stenosis of bilateral carotid arteries: Secondary | ICD-10-CM

## 2023-08-22 NOTE — Progress Notes (Signed)
March patient ID: Lillia Mountain, male   DOB: Oct 15, 1951, 72 y.o.   MRN: [id number]       HPI: MCLAIN BRADWAY is a 72 y.o. male the presents for a 7 month cardiology/sleep evaluation.  Mr Fron has known CAD and underwent initial percutaneous coronary intervention in 1998 of an unknown vessel. He underwent CABG revascularization surgery in December 2000 with LIMA to LAD, LIMA to the PDA, vein to the diagonal, and then to the PLA. 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  An echo Doppler study in January 2002 showed normal systolic function. His mild TR and mild MR. A nuclear perfusion study in March 2013 showed normal perfusion without scar or ischemia.  When I saw him in 2016 he had continued to do well and remained active and denied any chest pain.   He he had seen Nada Boozer in March 2016 and she had reduced his metoprolol to 12.5 mg twice a day due to asymptomatic bradycardia.  He had blood work in December 2015 by his primary care physician and his total cholesterol was 166, HDL 32, LDL 104, VLDL 29 and triglycerides 149.  He has been maintained on atorvastatin 40 mg, Plavix 75 mg, pantoprazole 40 mg, as well as Viagra as needed.  He underwent a nuclear perfusion study on April 26, 2015.  He remained asymptomatic.  He developed asymptomatic ST segment depression during stress.  Scintigraphic images were entirely normal.  He had normal function.  Ejection fraction was 52%.  He presents for evaluation.  When I saw him in October 2017 he was doing well and was without chest pain.  He was exercising daily.  He had lost over 40 pounds in the 8 months prior to that evaluation.  His peak weight was 242 and his weight this morning without clothes at home was 195.  He continues to use CPAP with 100% compliance.   He was evaluated by Randall An in  October 2018.  His blood pressure was elevated and amlodipine 5 mg was added to his benazepril and Lopressor.  He had resumed smoking and at that time was given a prescription for Chantix.  I saw him in December 2019 and prior to that evaluation he had  received a new CPAP machine which was prescribed by physician in [place].  He was sleeping well and cannot sleep without his CPAP.  Unfortunately he was still smoking 1/2 pack of cigarettes per day.  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  He walks at least 2 miles.  He denies any chest pain PND orthopnea.   He underwent carotid Dopplers in June 2020 which did not reveal any significant internal carotid stenoses in the 1 to 39% range.  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  A Myoview study in July 2020 showed EF 55 to 60% without scar or ischemia.  He was seen by Azalee Course, PA for preoperative clearance and underwent successful inflatable penile prosthesis by Dr. Melvia Heaps at [facility] urology.  He tolerated surgery without cardiovascular compromise.  I saw him in February 2021 at which time he denied any anginal symptomatology.  Unfortunately he was still smoking.  He continued to be compliant with CPAP therapy and "cannot sleep without it."  I saw him on January 09, 2021.  He remained stable and denied any chest  pain or shortness of breath but unfortunately was still smoking.   He has been working around the house remodeling his bathroom.  He has been on amlodipine 7.5 mg, benazepril 40 mg for hypertension, atorvastatin 40 mg and Zetia for hyperlipidemia, and is on testosterone injections every 14 days with continued low testosterone levels.  He continues to use CPAP with 100% compliance.    I saw him on September 04, 2021 and since his prior evaluation he was evaluated at [facility] ER and presented with a warm sensation to the left side of his chest radiating to his neck.  It was not  exertionally precipitated.  A chest x-ray did not reveal any active cardiopulmonary disease.  His ECG did not show any ischemia.  Troponins were negative.  He believes he started to notice the symptoms when he was started on Flomax and he was felt to have possible sensitivity to this.  He is now on silodosin (Rapaflo) and is tolerating this well.  He continues to be on amlodipine 7.5 mg daily and benazepril 40 mg for hypertension.  He is on atorvastatin 40 mg and Zetia 10 mg for hyperlipidemia.  He is on testosterone supplementation every 2 weeks.  He continues to use CPAP therapy with [employer] as his DME company in [place].  A download was obtained from September 22 through September 01, 2021 which shows excellent compliance with average use at 8 hours and 16 minutes per night.  He has been using a Respironics DreamWear mask.  At 13 cm set pressure, AHI is 5.3.  He is still smoking at least a half a pack of cigarettes per day and his wife also smokes.  He has some discomfort from bilateral inguinal hernias for which he wears a truss.  During that evaluation, with his ongoing tobacco history, and chest pain symptomatology I recommended he undergo a Lexiscan Myoview study.  He was continuing to use CPAP with excellent compliance and "cannot sleep without it."   On October 13, 2021 he underwent his [place] Myoview study.  This was low risk and did not demonstrate any ischemia.  There was normal wall motion.    I saw him on November 20, 2021.  On October 13, 2021 he had undergone a [place] Myoview study which was unchanged from previously and remained low risk without ischemia.  He had bilateral inguinal hernia as well as an umbilical hernia and was planning to undergo future surgery.  He continues to use CPAP with 100% compliance with his DME company called "[employer] "in [place]. He denied recurrent chest pain.  He was sleeping well.  He typically goes to bed at 10 PM and wakes up at 6:30 AM.  Download  was obtained of his CPAP unit from December 8 through November 17, 2021.  Compliance is excellent with average use of 7 hours and 27 minutes.  His current CPAP unit is not an auto unit and is set at a pressure of 14 cm.  AHI was slightly increased at 6.5.  He continues to be on amlodipine and benazepril for blood pressure control.  He is on testosterone injections every 2 weeks and often undergoes phlebotomy followed by hematology.  His next phlebotomy scheduled for January 11.    I saw  him on May 09, 2022. He underwent open left inguinal hernia repair, robotic assisted right inguinal hernia repair, and open umbilical hernia repair on January 11, 2022 without cardiovascular compromise.  He also undergoes hematologic evaluation for his thrombocytopenia and  erythrocytosis and undergoes periodic phlebotomy.  He denies any chest pain or shortness of breath.  He remains active.  He continues to use CPAP and a download from May 28 through May 07, 2022 confirms excellent compliance with average use at 7 hours and 18 minutes per night.  However, his CPAP is set at a pressure of 16 cm.  AHI is increased at 8.2.  He does not have a CPAP auto unit.  At that evaluation I added EPR of 3 to his device.  We discussed after 5 years he would qualify for a new machine.  I last saw him on February 07, 2023 at which time he felt well. 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  He has continued to use CPAP with 100% compliance which I was to verify on his phone recordings.  Apparently, his unit stopped being wireless since March 10.  His current CPAP machine was set up on January 21, 2018 and is an AirSense 10 CPAP unit and does not have auto capability.  His DME company is now [employer].  He continues to be on amlodipine 10 mg, benazepril 40 mg for hypertension.  He is on Zetia 10 mg and atorvastatin 40 mg for hyperlipidemia.  He takes pantoprazole for GERD.  He continues to be  on clopidogrel 75 mg daily.  During that evaluation, since it was over 5 years since receiving his [employer]Med AirSense 10 CPAP unit which did not have an auto mode I recommended he receive a ResMed AirSense 11 AutoSet unit and recommended a set up with EPR of 3 with pressure range of 15 to 20 cm of water.  Since I last saw him, he never received a new CPAP machine.  His current machine having received from "[employer] "in [place].  He continues to be compliant and has been getting supplies from [employer].  His insurance is Medicare/[employer] for life.  Most recent compliance data showed 100% use and at 16 cm set pressure AHI was 4.1.  Epworth Sleepiness Scale score endorsed at 1.  Our sleep coordinator rwas away this week and I was unable gather additional data.  He continues to be active.  His initial blood pressure on arriving to the office was elevated but on repeat by me today was stable.  He is walking daily at 3.5 mph.  He is no longer on metoprolol which was stopped several years ago.  He continues to be on amlodipine 10 mg, benazepril 40 mg for hypertension.  He continues to be on clopidogrel 75 mg.  He takes pantoprazole for GERD.  He presents for evaluation  Past Medical History:  Diagnosis Date   Aortic atherosclerosis (HCC)    BPH (benign prostatic hyperplasia)    CAD (coronary artery disease)    a. s/p CABG in 2000 with LIMA-LAD, RIMA-PDA, SVG-D1, and SVG-PLA b. low-risk NST in 2016   Carotid artery stenosis    Cyst of right kidney    ED (erectile dysfunction)    a.) penile implant in place   Fatty liver    GERD (gastroesophageal reflux disease)    Hyperlipidemia    Hypertension    Long term current use of antithrombotics/antiplatelets    a.) clopidogrel   Low testosterone    a.) on exogenous TRT   OSA on CPAP    Pre-diabetes    S/P CABG x 4 2000   a.) LIMA-LAD, RIMA-PDA, SVG-D1, SVG-PLA   Secondary  erythrocytosis    a.) smoking + TRT + OSAH   Sinus bradycardia    Skin  cancer    Thrombocytopenia (HCC)    Tongue lesion    benign from trauma as of 06/20/21    Past Surgical History:  Procedure Laterality Date   CATARACT EXTRACTION, BILATERAL  2010   COLONOSCOPY WITH PROPOFOL N/A 04/15/2020   Procedure: COLONOSCOPY WITH PROPOFOL;  Surgeon: Earline Mayotte, MD;  Location: [facility] ENDOSCOPY;  Service: Endoscopy;  Laterality: N/A;   CORONARY ARTERY BYPASS GRAFT  2000   LIMA to the LAD,RIMA to the PDA,vein graft to the diagonal & vein graft to the PLA   ESOPHAGOGASTRODUODENOSCOPY     Dr. Marva Panda   ESOPHAGOGASTRODUODENOSCOPY  04/15/2020   Procedure: ESOPHAGOGASTRODUODENOSCOPY (EGD);  Surgeon: Earline Mayotte, MD;  Location: [facility] ENDOSCOPY;  Service: Endoscopy;;   HOLEP-LASER ENUCLEATION OF THE PROSTATE WITH MORCELLATION N/A 11/27/2021   Procedure: HOLEP-LASER ENUCLEATION OF THE PROSTATE WITH MORCELLATION;  Surgeon: Vanna Scotland, MD;  Location: [facility] ORS;  Service: Urology;  Laterality: N/A;   INGUINAL HERNIA REPAIR Left 01/11/2022   Procedure: HERNIA REPAIR INGUINAL ADULT;  Surgeon: Henrene Dodge, MD;  Location: [facility] ORS;  Service: General;  Laterality: Left;   KNEE ARTHROSCOPY Right 1991   KNEE DEBRIDEMENT Right 1989   Fluid flush   PENILE PROSTHESIS IMPLANT     06/26/19 Dr. Guy Sandifer   SHOULDER ARTHROSCOPY Left 2011   UMBILICAL HERNIA REPAIR N/A 01/11/2022   Procedure: HERNIA REPAIR UMBILICAL ADULT;  Surgeon: Henrene Dodge, MD;  Location: [facility] ORS;  Service: General;  Laterality: N/A;   US ECHOCARDIOGRAPHY  12/12/2010   mild LA dilatation, normal LV systolic fx, EF > 55%   XI ROBOTIC ASSISTED INGUINAL HERNIA REPAIR WITH MESH Right 01/11/2022   Procedure: XI ROBOTIC ASSISTED INGUINAL HERNIA REPAIR WITH MESH;  Surgeon: Henrene Dodge, MD;  Location: [facility] ORS;  Service: General;  Laterality: Right;    No Known Allergies   Current Outpatient Medications  Medication Sig Dispense Refill   amLODipine (NORVASC) 10 MG tablet Take 1 tablet (10 mg total) by mouth  every evening. 90 tablet 3   atorvastatin (LIPITOR) 40 MG tablet TAKE 1 TABLET(40 MG) BY MOUTH DAILY AT NIGHT 90 tablet 3   B-D 3CC LUER-LOK SYR 23GX1-1/2 23G X 1-1/2" 3 ML MISC Use once every 14 days 50 each 5   benazepril (LOTENSIN) 40 MG tablet Take 1 tablet (40 mg total) by mouth every evening. 90 tablet 3   clopidogrel (PLAVIX) 75 MG tablet Take 1 tablet (75 mg total) by mouth every evening. 90 tablet 3   ezetimibe (ZETIA) 10 MG tablet TAKE 1 TABLET(10 MG) BY MOUTH DAILY 90 tablet 3   ibuprofen (ADVIL) 200 MG tablet Take 400 mg by mouth every 8 (eight) hours as needed (pain.).     Multiple Vitamins-Minerals (MULTIVITAMIN WITH MINERALS) tablet Take 1 tablet by mouth every evening. Men 50+     NEEDLE, DISP, 18 G (B-D HYPODERMIC NEEDLE 18GX1.5") 18G X 1-1/2" MISC DRAW UP MEDICATION WITH THIS NEEDLE AS DIRECTED 30 each 0   pantoprazole (PROTONIX) 40 MG tablet Take 1 tablet (40 mg total) by mouth every evening. TAKE 1 TABLET BY MOUTH EVERY DAY 30 MINUTES BEFORE A MEAL 90 tablet 3   testosterone cypionate (DEPOTESTOSTERONE CYPIONATE) 200 MG/ML injection ADMINISTER 1.5 ML(300 MG) IN THE MUSCLE EVERY 14 DAYS 10 mL 0   No current facility-administered medications for this visit.    Social History   Socioeconomic History  Marital status: Married    Spouse name: Not on file   Number of children: 3   Years of education: 16   Highest education level: Bachelor's degree (e.g., BA, AB, BS)  Occupational History   Occupation: Retired Electronics engineer   Occupation: Management/Technical Support    Comment: Educational psychologist  Tobacco Use   Smoking status: Every Day    Current packs/day: 0.50    Average packs/day: 0.5 packs/day for 44.0 years (22.0 ttl pk-yrs)    Types: Cigarettes   Smokeless tobacco: Never   Tobacco comments:    on and off smoker   Vaping Use   Vaping status: Never Used  Substance and Sexual Activity   Alcohol use: Yes    Alcohol/week: 10.0 standard drinks of alcohol    Types: 10 Cans  of beer per week    Comment: Down to 3 beers a day from 4-6 a day    Drug use: No   Sexual activity: Yes    Partners: Female    Comment: Wife  Other Topics Concern   Not on file  Social History Narrative   Candler grew up in [place]. He is currently living in [place] with his wife. This is his second marriage. He has 1 daughter and 2 sons from his first marriage. He has 4 step kids. His daughter lives in [place], one son in [place], [place] and the other son in [place], [place]. 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       He worked Designer, industrial/product for a [employer]    He enjoys wood working on his spare time. 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 He also enjoys shooting sports - target shooting and [employer] Herbalist).       He retired 04/2019 and enjoys this       2 brothers and 2 sisters he is next to youngest    Social Determinants of Corporate investment banker Strain: Low Risk  (04/22/2023)   Overall Financial Resource Strain (CARDIA)    Difficulty of Paying Living Expenses: Not hard at all  Food Insecurity: No Food Insecurity (04/22/2023)   Hunger Vital Sign    Worried About Running Out of Food in the Last Year: Never true    Ran Out of Food in the Last Year: Never true  Transportation Needs: No Transportation Needs (04/22/2023)   PRAPARE - Administrator, Civil Service (Medical): No    Lack of Transportation (Non-Medical): No  Physical Activity: Unknown (04/22/2023)   Exercise Vital Sign    Days of Exercise per Week: Patient declined    Minutes of Exercise per Session: 0 min  Recent Concern: Physical Activity - Inactive (03/27/2023)   Exercise Vital Sign    Days of Exercise per Week: 0 days    Minutes of Exercise per Session: 0 min  Stress: No Stress Concern Present (04/22/2023)   [employer] of Occupational Health - Occupational Stress Questionnaire    Feeling of Stress : Not at all  Social Connections: Moderately  Isolated (04/22/2023)   Social Connection and Isolation Panel [NHANES]    Frequency of Communication with Friends and Family: More than three times a week    Frequency of Social Gatherings with Friends and Family: Once a week    Attends Religious Services: Never    Database administrator or Organizations: No    Attends Banker Meetings: Never    Marital Status: Married  Intimate Partner Violence: Not At Risk (03/27/2023)   Humiliation, Afraid, Rape, and Kick questionnaire    Fear of Current or Ex-Partner: No    Emotionally Abused: No    Physically Abused: No    Sexually Abused: No    Family History  Problem Relation Age of Onset   Arthritis Mother    Heart disease Father        CAD - MI   Stroke Father    Hypertension Father    Cancer Father 72       esophageal cancer    Arthritis Maternal Grandmother     ROS General: Negative; No fevers, chills, or night sweats;  HEENT: Negative; No changes in vision or hearing, sinus congestion, difficulty swallowing Pulmonary: Negative; No cough, wheezing, shortness of breath, hemoptysis Cardiovascular: See history of present illness GI: Negative; No nausea, vomiting, diarrhea, or abdominal pain GU: Low testosterone no dysuria, hematuria, or difficulty voiding Musculoskeletal: Negative; no myalgias, joint pain, or weakness Hematologic/Oncology: History of erythrocytosis and thrombocytopenia. 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  No snoring, daytime sleepiness, hypersomnolence, bruxism, restless legs, hypnogognic hallucinations, no cataplexy  An Epworth Sleepiness Scale score was calculated in the office today, August 22, 2019 which endorsed at 1 arguing against residual  daytime sleepiness.  Other comprehensive 14 point system review is negative.  PE BP (!) 150/96   Pulse 62   Ht 5\' 11"  (1.803 m)   Wt 215 lb 3.2 oz (97.6 kg)   SpO2 96%   BMI 30.01 kg/m    Repeat blood pressure was 138/80  Wt Readings from Last 3 Encounters:  08/22/23 215 lb 3.2 oz (97.6 kg)  08/20/23 213 lb 8 oz (96.8 kg)  04/23/23 217 lb 6.4 oz (98.6 kg)   General: Alert, oriented, no distress.  Skin: normal turgor, no rashes, warm and dry HEENT: Normocephalic, atraumatic. Pupils equal round and reactive to light; sclera anicteric; extraocular muscles intact;  Nose without nasal septal hypertrophy Mouth/Parynx benign; Mallinpatti scale 3 Neck: No JVD, no carotid bruits; normal carotid upstroke Lungs: clear to ausculatation and percussion; no wheezing or rales Chest wall: without tenderness to palpitation Heart: PMI not displaced, RRR, s1 s2 normal, 1/6 systolic murmur, no diastolic murmur, no rubs, gallops, thrills, or heaves Abdomen: soft, nontender; no hepatosplenomehaly, BS+; abdominal aorta nontender and not dilated by palpation. Back: no CVA tenderness Pulses 2+ Musculoskeletal: full range of motion, normal strength, no joint deformities Extremities: no clubbing cyanosis or edema, Homan's sign negative  Neurologic: grossly nonfocal; Cranial nerves grossly wnl Psychologic: Normal mood and affect  EKG Interpretation Date/Time:  Thursday August 22 2023 11:25:02 EDT Ventricular Rate:  62 PR Interval:  520 QRS Duration:  102 QT Interval:  412 QTC Calculation: 418 R Axis:   15  Text Interpretation: Sinus rhythm with 1st degree A-V block When compared with ECG of 08-Aug-2021 20:21, Borderline criteria for Anterior infarct are no longer Present Confirmed by Nicki Guadalajara ([id number]) on 08/22/2023 12:07:59 PM  PR interval is now markedly prolonged.  February 07, 2023 ECG (independently read by me): Sinus rhythm at 65, 1st degree AV block  May 09, 2022 ECG (independently read  by me): Sinus bradycardia at 55, 1st degree AV block, PR 248 msec  November 20, 2021 ECG (independently read by me): Sinus bradycardia  at 80, 1st degree AV block; PR 246 msec  September 04, 2021 ECG (independently read by me): Sinus bradycardia 55, PR 210 msec, inferior T wave changes and Q wave III, aVF,   January 09, 2021 ECG (independently read by me): Sinus bradycardia at 53; 1st degree block, PR 222 msec, borderline LVH  February 2021 ECG (independently read by me): Sinus bradycardia at 58 bpm, first-degree AV block with a PR interval at 244 ms.  No significant ST-T changes.  QTc interval 431 ms  December 2019 ECG (independently read by me): Sinus bradycardia at 56 bpm.  First-degree block with PR interval 264 ms.  QTc interval 443 ms  October 2017 ECG (independently read by me): Sinus bradycardia at 44 bpm with first degree AV block.  Q wave in leads III and F.  PR interval 224 ms.  June 2016 ECG (independently read by me): not done today due to recent treadmill nuclear study  ECG (independently read by me): Sinus bradycardia at 45 beats per minute. Normal intervals. Q waves in lead 3 and small Q wave in aVF.  LABS:    Latest Ref Rng & Units 03/21/2023   10:09 AM 08/16/2022    1:20 PM 08/03/2022   10:21 AM  BMP  Glucose 70 - 99 mg/dL 782  956  213   BUN 6 - 23 mg/dL 15  16  17    Creatinine 0.40 - 1.50 mg/dL 0.86  5.78  4.69   Sodium 135 - 145 mEq/L 139  139  139   Potassium 3.5 - 5.1 mEq/L 4.4  4.7  4.6   Chloride 96 - 112 mEq/L 104  106  104   CO2 19 - 32 mEq/L 25  28  25    Calcium 8.4 - 10.5 mg/dL 9.0  8.9  9.3       Latest Ref Rng & Units 03/21/2023   10:09 AM 08/16/2022    1:20 PM 08/03/2022   10:21 AM  Hepatic Function  Total Protein 6.0 - 8.3 g/dL 6.5  6.6  6.9   Albumin 3.5 - 5.2 g/dL 4.2  4.1  4.4   AST 0 - 37 U/L 21  28  28    ALT 0 - 53 U/L 18  25  26    Alk Phosphatase 39 - 117 U/L 67  69  81   Total Bilirubin 0.2 - 1.2 mg/dL 0.8  1.0  0.9       Latest Ref Rng &  Units 08/20/2023   12:32 PM 04/15/2023    8:58 AM 02/18/2023    1:20 PM  CBC  WBC 4.0 - 10.5 K/uL 7.3   8.2   Hemoglobin 13.0 - 17.0 g/dL 62.9   52.8   Hematocrit 39.0 - 52.0 % 50.2  48.7  51.4   Platelets 150 - 400 K/uL 126   141    Lab Results  Component Value Date   MCV 93.7 08/20/2023   MCV 95.4 02/18/2023   MCV 93.3 08/16/2022   Lab Results  Component Value Date   TSH 1.50 01/03/2022   Lab Results  Component Value Date   HGBA1C 5.9 03/21/2023    Lipid Panel     Component Value Date/Time   CHOL 135 03/21/2023 1009   CHOL 167 12/17/2019 1113   TRIG 64.0 03/21/2023 1009   HDL 65.70 03/21/2023 1009   HDL 86 12/17/2019 1113   CHOLHDL 2 03/21/2023 1009   VLDL 12.8 03/21/2023 1009   LDLCALC 57 03/21/2023 1009  LDLCALC 65 12/17/2019 1113   LDLDIRECT 91.6 01/06/2014 1443    RADIOLOGY: No results found.  IMPRESSION: 1. Primary hypertension   2. OSA on CPAP   3. Coronary artery disease involving native coronary artery of native heart without angina pectoris   4. Hx of CABG   5. Aortic atherosclerosis (HCC)   6. Centrilobular emphysema (HCC)     ASSESSMENT AND PLAN: Mr. Lytle Marinacci is a 72 year-old gentleman who has a history of CAD and underwent initial intervention in 1998. Due to progressive CAD he underwent CABG surgery in 2000. A nuclear perfusion study in 2013 remained normal without scar or ischemia.  A three-year follow-up nuclear perfusion study on 04/26/2015 continued to show entirely normal perfusion without scar or ischemia.  However, he developed asymptomatic ST depression during stress.  For this reason, it was interpreted as intermediate study.  Post-rest ejection fraction was 52%.  A repeat Myoview in July 2020 EF 55 to 60% was without ischemia or scar.  He underwent successful penile implant surgery without cardiovascular compromise.  Unfortunately he continued to smoke cigarettes and developed atypical chest pain symptomatology.  A subsequent Lexiscan  Myoview study on October 13, 2021 was unchanged and remained low risk without ischemia.  At his cardiology visit with me in January 2023, he was given cardiology clearance and underwent successful bilateral inguinal hernia surgery as well as umbilical surgery on January 11, 2022 without cardiovascular compromise.  His blood pressure has remained stable currently on his regimen of amlodipine 10 mg and benazepril 40 mg daily.  He is on atorvastatin 40 mg and Zetia 10 mg for hyperlipidemia.  Lipid studies in September 2023 showed total cholesterol 130 triglycerides 94 HDL 59 and LDL 52.  He has had stable mild thrombocytopenia with platelet count in October 2023 at 133.  He has not had any chest pain or change in exercise capacity.  He has documented centrilobular emphysema without focal consolidation noted on CT imaging.  He has continued to use CPAP therapy and with his set up date on January 21, 2018 he is over 5 years since receiving his current ResMed AirSense 10 CPAP unit which does not have auto mode capability.  He qualifies for a new machine and has remained compliant.  Our sleep coordinator is on vacation this week.  We will try to expedite him getting a new ResMed AirSense 11 AutoSet unit and I will set him up with a range of 15 to 20 cm of water which EPR of 3.  Blood pressure today is relatively stable on repeat by me although was increased upon arrival to the office when taken by the nurse.  He typically walks on the treadmill at 3.5 mph.  For 1-1/4 miles without chest pain or dyspnea.  He is no longer on metoprolol.  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  I will see him in 4 months for follow-up evaluation.   Lennette Bihari, MD, [facility], [facility], American Board of Sleep Medicine  08/25/2023 10:21 AM

## 2023-08-22 NOTE — Patient Instructions (Addendum)
Medication Instructions:  No changes *If you need a refill on your cardiac medications before your next appointment, please call your pharmacy*   Lab Work: No labs If you have labs (blood work) drawn today and your tests are completely normal, you will receive your results only by: MyChart Message (if you have MyChart) OR A paper copy in the mail If you have any lab test that is abnormal or we need to change your treatment, we will call you to review the results.   Testing/Procedures: none   Follow-Up: At [facility], you and your health needs are our priority.  As part of our continuing mission to provide you with exceptional heart care, we have created designated Provider Care Teams.  These Care Teams include your primary Cardiologist (physician) and Advanced Practice Providers (APPs -  Physician Assistants and Nurse Practitioners) who all work together to provide you with the care you need, when you need it.  We recommend signing up for the patient portal called "MyChart".  Sign up information is provided on this After Visit Summary.  MyChart is used to connect with patients for Virtual Visits (Telemedicine).  Patients are able to view lab/test results, encounter notes, upcoming appointments, etc.  Non-urgent messages can be sent to your provider as well.   To learn more about what you can do with MyChart, go to [email or web address].    Your next appointment:   4-5 months pending receiving new cpap machine. Return after you've used the machine for 90 days  Provider:   Nicki Guadalajara, MD

## 2023-08-25 ENCOUNTER — Encounter: Payer: Self-pay | Admitting: Cardiovascular Disease

## 2023-08-26 NOTE — Addendum Note (Signed)
Addended by: Brunetta Genera on: 08/26/2023 09:05 AM   Modules accepted: Orders

## 2023-08-27 DIAGNOSIS — Z85828 Personal history of other malignant neoplasm of skin: Secondary | ICD-10-CM | POA: Diagnosis not present

## 2023-08-28 DIAGNOSIS — C44622 Squamous cell carcinoma of skin of right upper limb, including shoulder: Secondary | ICD-10-CM | POA: Diagnosis not present

## 2023-08-28 DIAGNOSIS — D0461 Carcinoma in situ of skin of right upper limb, including shoulder: Secondary | ICD-10-CM | POA: Diagnosis not present

## 2023-09-09 ENCOUNTER — Telehealth: Payer: Self-pay | Admitting: Cardiovascular Disease

## 2023-09-09 NOTE — Telephone Encounter (Signed)
Pt is calling regarding his cpap machine. Patient would like a call back.

## 2023-09-17 ENCOUNTER — Other Ambulatory Visit: Payer: Self-pay

## 2023-09-18 ENCOUNTER — Other Ambulatory Visit: Payer: Self-pay

## 2023-09-18 DIAGNOSIS — K219 Gastro-esophageal reflux disease without esophagitis: Secondary | ICD-10-CM

## 2023-09-18 MED ORDER — PANTOPRAZOLE SODIUM 40 MG PO TBEC: 40.0000 mg | DELAYED_RELEASE_TABLET | Freq: Every evening | ORAL | 3 refills | Status: DC

## 2023-09-18 NOTE — Telephone Encounter (Signed)
Upon patient request DME selection is [facility]. He has his new cpap as of 09/09/23.

## 2023-09-30 ENCOUNTER — Ambulatory Visit (INDEPENDENT_AMBULATORY_CARE_PROVIDER_SITE_OTHER): Payer: Medicare Other | Admitting: Family Medicine

## 2023-09-30 ENCOUNTER — Encounter: Payer: Self-pay | Admitting: Family Medicine

## 2023-09-30 VITALS — BP 122/70 | HR 63 | Temp 98.2°F | Resp 16 | Ht 71.0 in | Wt 214.5 lb

## 2023-09-30 DIAGNOSIS — E7849 Other hyperlipidemia: Secondary | ICD-10-CM | POA: Diagnosis not present

## 2023-09-30 DIAGNOSIS — I2510 Atherosclerotic heart disease of native coronary artery without angina pectoris: Secondary | ICD-10-CM

## 2023-09-30 DIAGNOSIS — E663 Overweight: Secondary | ICD-10-CM

## 2023-09-30 DIAGNOSIS — I10 Essential (primary) hypertension: Secondary | ICD-10-CM

## 2023-09-30 LAB — COMPREHENSIVE METABOLIC PANEL
ALT: 20 U/L (ref 0–53)
AST: 22 U/L (ref 0–37)
Albumin: 4.5 g/dL (ref 3.5–5.2)
Alkaline Phosphatase: 80 U/L (ref 39–117)
BUN: 15 mg/dL (ref 6–23)
CO2: 27 meq/L (ref 19–32)
Calcium: 9.4 mg/dL (ref 8.4–10.5)
Chloride: 104 meq/L (ref 96–112)
Creatinine, Ser: 1.13 mg/dL (ref 0.40–1.50)
GFR: 64.42 mL/min (ref >60.00)
Glucose, Bld: 104 mg/dL — ABNORMAL HIGH (ref 70–99)
Potassium: 4.5 meq/L (ref 3.5–5.1)
Sodium: 140 meq/L (ref 135–145)
Total Bilirubin: 0.8 mg/dL (ref 0.2–1.2)
Total Protein: 6.8 g/dL (ref 6.0–8.3)

## 2023-09-30 MED ORDER — SEMAGLUTIDE-WEIGHT MANAGEMENT 0.25 MG/0.5ML ~~LOC~~ SOAJ
0.2500 mg | SUBCUTANEOUS | 0 refills | Status: DC
Start: 2023-09-30 — End: 2023-10-28

## 2023-09-30 NOTE — Progress Notes (Signed)
SUBJECTIVE:   Chief Complaint  Patient presents with   Medical Management of Chronic Issues   HPI Presents for follow up chronic disease management  Discussed the use of AI scribe software for clinical note transcription with the patient, who gave verbal consent to proceed.  History of Present Illness The patient, with a history of hypertension and cardiovascular disease, presents for a six-month follow-up. He reports stable blood pressure readings at home, typically in the 120s over 60s. He has been adhering to his prescribed blood pressure medications and has not required any refills. He has been on Plavix for approximately 15 years without any signs of bleeding or clotting issues.  The patient also reports regular follow-ups with his cardiologist, who also manages his sleep apnea with a CPAP machine. He is due for another appointment in January.  In terms of his overall health, the patient has been struggling with weight loss. Despite attempts at dieting and incorporating exercise into his routine, he has found it difficult to reduce his portion sizes due to persistent hunger. He expresses interest in GLP1 for weight loss, acknowledging his understanding of the potential risks and benefits.  The patient also has a history of urinary issues, for which he has undergone surgery. He reports no current problems with urination. He has been regular with no blood in his stool. He has not experienced any chest pain or shortness of breath.  The patient's blood work, including kidney function, was last checked six months ago. He has regular bloodletting due to high hematocrit levels associated with testosterone treatment. He is due for a complete workup, including cholesterol and A1c levels.  The patient's spouse is also seeking a new primary care provider due to their current doctor's impending departure. The patient expresses a desire for his spouse to be seen by the same provider.     PERTINENT PMH / PSH: As above  OBJECTIVE:  BP 122/70   Pulse 63   Temp 98.2 F (36.8 C)   Resp 16   Ht 5\' 11"  (1.803 m)   Wt 214 lb 8 oz (97.3 kg)   SpO2 98%   BMI 29.92 kg/m    Physical Exam Vitals reviewed.  Constitutional:      General: He is not in acute distress.    Appearance: Normal appearance. He is not ill-appearing, toxic-appearing or diaphoretic.  Eyes:     General:        Right eye: No discharge.        Left eye: No discharge.  Neck:     Thyroid: No thyromegaly or thyroid tenderness.  Cardiovascular:     Rate and Rhythm: Normal rate and regular rhythm.     Heart sounds: Normal heart sounds.  Pulmonary:     Effort: Pulmonary effort is normal.     Breath sounds: Normal breath sounds.  Abdominal:     General: Bowel sounds are normal.  Musculoskeletal:        General: Normal range of motion.     Cervical back: Normal range of motion.  Skin:    General: Skin is warm and dry.  Neurological:     Mental Status: He is alert and oriented to person, place, and time. Mental status is at baseline.  Psychiatric:        Mood and Affect: Mood normal.        Behavior: Behavior normal.        Thought Content: Thought content normal.  Judgment: Judgment normal.        09/30/2023    9:41 AM 04/23/2023    8:16 AM 02/20/2023    1:03 PM 08/03/2022    9:44 AM 06/20/2021    9:38 AM  Depression screen PHQ 2/9  Decreased Interest 0 0 0 0 0  Down, Depressed, Hopeless 0 0 0 0 0  PHQ - 2 Score 0 0 0 0 0  Altered sleeping 0 0     Tired, decreased energy 0 0     Change in appetite 0 0     Feeling bad or failure about yourself  0 0     Trouble concentrating 0 0     Moving slowly or fidgety/restless 0 0     Suicidal thoughts 0 0     PHQ-9 Score 0 0     Difficult doing work/chores Not difficult at all Not difficult at all         09/30/2023    9:41 AM 04/23/2023    8:16 AM 02/20/2023    1:03 PM 06/15/2020   10:34 AM  GAD 7 : Generalized Anxiety Score  Nervous,  Anxious, on Edge 0 0 0 0  Control/stop worrying 0 0 0 0  Worry too much - different things 0 0 0 0  Trouble relaxing 0 0 0 0  Restless 0 0 0 0  Easily annoyed or irritable 0 0 0 0  Afraid - awful might happen 0 0 0 0  Total GAD 7 Score 0 0 0 0  Anxiety Difficulty Not difficult at all Not difficult at all Not difficult at all Not difficult at all    ASSESSMENT/PLAN:  Primary hypertension Assessment & Plan: Well-controlled with current medication regimen. No changes in medication required. -Continue current blood pressure medications, Amlodipine 10 mg at night -Refill Benzapril 40 mg at night -Check Cmet    Orders: -     Comprehensive metabolic panel -     Benazepril HCl; Take 1 tablet (40 mg total) by mouth every evening.  Other hyperlipidemia Assessment & Plan: Current.  Recent LDL at goal. Continue Zetia 10 mg daily Continue atorvastatin 40 mg daily    Overweight (BMI 25.0-29.9) Assessment & Plan: Patient expressed interest in GLP-1 agonist for weight loss. Discussed potential benefits and side effects, as well as the importance of diet and exercise in conjunction with medication. Meets criteria BMI >27, HTN, HLD. No prior history of medullary thyroid cancer, pancreatitis or family history of thyroid cancer.  -Start Wegovy 0.25 mg weekly -Schedule follow-up appointment 4 weeks after initiation of medication to assess tolerance and effectiveness.   Coronary artery disease involving native coronary artery of native heart without angina pectoris Assessment & Plan: On Plavix for 15 years with no signs of bleeding or clotting issues. Reports taking for preventative use. 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  Orders: -     Clopidogrel Bisulfate; Take 1 tablet (75 mg total) by mouth every evening.    General Health Maintenance -Patient declined flu vaccine -Pneumonia vaccination completed   PDMP  reviewed  Return if symptoms worsen or fail to improve, for PCP. Patient to schedule appointment 4 weeks after initiation of GLP1  Dana Allan, MD

## 2023-09-30 NOTE — Patient Instructions (Addendum)
It was a pleasure meeting you today. Thank you for allowing me to take part in your health care.  Our goals for today as we discussed include:  We will get some labs today.  If they are abnormal or we need to do something about them, I will call you.  If they are normal, I will send you a message on MyChart (if it is active) or a letter in the mail.  If you don't hear from [place] in 2 weeks, please call the office at the number below.   Start Wegovy 0.25 mg weekly. Follow up 4 weeks after starting medication   Continue current medications for blood pressure and cholesterol   This is a list of the screening recommended for you and due dates:  Health Maintenance  Topic Date Due   Medicare Annual Wellness Visit  Never done   DTaP/Tdap/Td vaccine (1 - Tdap) Never done   Zoster (Shingles) Vaccine (2 of 2) 06/28/2016   Flu Shot  02/10/2024*   Screening for Lung Cancer  03/14/2024   Colon Cancer Screening  04/15/2025   Pneumonia Vaccine  Completed   Hepatitis C Screening  Completed   HPV Vaccine  Aged Out   COVID-19 Vaccine  Discontinued  *Topic was postponed. The date shown is not the original due date.     Follow up 4 weeks after starting [facility]  If you have any questions or concerns, please do not hesitate to call the office at [phone].  I look forward to our next visit and until then take care and stay safe.  Regards,   Dana Allan, MD   [facility]

## 2023-10-01 ENCOUNTER — Encounter: Payer: Self-pay | Admitting: Oncology

## 2023-10-01 ENCOUNTER — Other Ambulatory Visit: Payer: Self-pay

## 2023-10-01 ENCOUNTER — Telehealth: Payer: Self-pay

## 2023-10-01 ENCOUNTER — Other Ambulatory Visit (HOSPITAL_COMMUNITY): Payer: Self-pay

## 2023-10-01 MED ORDER — "BD LUER-LOK SYRINGE 23G X 1-1/2"" 3 ML MISC": 5 refills | Status: DC

## 2023-10-01 NOTE — Telephone Encounter (Signed)
Pharmacy Patient Advocate Encounter   Received notification from Physician's Office that prior authorization for [facility] is required/requested.   Insurance verification completed.   The patient is insured through [employer] .   Per test claim: PA required; PA submitted to above mentioned insurance via CoverMyMeds Key/confirmation #/EOC Key: [id number]  Status is pending

## 2023-10-02 ENCOUNTER — Other Ambulatory Visit (HOSPITAL_COMMUNITY): Payer: Self-pay

## 2023-10-02 NOTE — Telephone Encounter (Signed)
Pharmacy Patient Advocate Encounter  Received notification from [employer] that Prior Authorization for [facility] has been APPROVED from 10.20.24 to 11.19.25. Ran test claim, Copay is $43.00V for a month supply. This test claim was processed through [facility]- copay amounts may vary at other pharmacies due to pharmacy/plan contracts, or as the patient moves through the different stages of their insurance plan.   PA #/Case ID/Reference #: Key: [id number]

## 2023-10-02 NOTE — Telephone Encounter (Signed)
Patient requested that his prescription be sent to [facility] in [place] not [facility] pharmacy.

## 2023-10-03 ENCOUNTER — Other Ambulatory Visit: Payer: Self-pay

## 2023-10-03 ENCOUNTER — Telehealth: Payer: Self-pay

## 2023-10-03 DIAGNOSIS — I10 Essential (primary) hypertension: Secondary | ICD-10-CM

## 2023-10-03 DIAGNOSIS — E663 Overweight: Secondary | ICD-10-CM

## 2023-10-03 DIAGNOSIS — E7849 Other hyperlipidemia: Secondary | ICD-10-CM

## 2023-10-03 MED ORDER — SEMAGLUTIDE-WEIGHT MANAGEMENT 0.25 MG/0.5ML ~~LOC~~ SOAJ
0.2500 mg | SUBCUTANEOUS | 0 refills | Status: AC
  Filled 2023-10-03: qty 2, 28d supply, fill #0

## 2023-10-03 NOTE — Telephone Encounter (Signed)
Patient states we originally sent his prescription for Wegovy to [facility] in [place].  Patient states they do not have this medication.  Patient states he would like for [place] to please send this prescription to the [facility].

## 2023-10-03 NOTE — Telephone Encounter (Signed)
Rx resent to [facility] pharmacy per pt request.

## 2023-10-09 ENCOUNTER — Encounter: Payer: Self-pay | Admitting: Family Medicine

## 2023-10-09 DIAGNOSIS — E663 Overweight: Secondary | ICD-10-CM | POA: Insufficient documentation

## 2023-10-09 MED ORDER — CLOPIDOGREL BISULFATE 75 MG PO TABS
75.0000 mg | ORAL_TABLET | Freq: Every evening | ORAL | Status: DC
Start: 2023-10-09 — End: 2024-03-30

## 2023-10-09 MED ORDER — BENAZEPRIL HCL 40 MG PO TABS
40.0000 mg | ORAL_TABLET | Freq: Every evening | ORAL | Status: DC
Start: 2023-10-09 — End: 2024-03-30

## 2023-10-09 NOTE — Assessment & Plan Note (Signed)
Current.  Recent LDL at goal. Continue Zetia 10 mg daily Continue atorvastatin 40 mg daily

## 2023-10-09 NOTE — Assessment & Plan Note (Signed)
Well-controlled with current medication regimen. No changes in medication required. -Continue current blood pressure medications, Amlodipine 10 mg at night -Refill Benzapril 40 mg at night -Check Cmet

## 2023-10-09 NOTE — Assessment & Plan Note (Addendum)
On Plavix for 15 years with no signs of bleeding or clotting issues. Reports taking for preventative use. 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

## 2023-10-09 NOTE — Assessment & Plan Note (Addendum)
Patient expressed interest in GLP-1 agonist for weight loss. Discussed potential benefits and side effects, as well as the importance of diet and exercise in conjunction with medication. Meets criteria BMI >27, HTN, HLD. No prior history of medullary thyroid cancer, pancreatitis or family history of thyroid cancer.  -Start Wegovy 0.25 mg weekly -Schedule follow-up appointment 4 weeks after initiation of medication to assess tolerance and effectiveness.

## 2023-10-16 ENCOUNTER — Other Ambulatory Visit: Payer: Self-pay

## 2023-10-16 DIAGNOSIS — E291 Testicular hypofunction: Secondary | ICD-10-CM

## 2023-10-17 ENCOUNTER — Other Ambulatory Visit: Payer: Medicare Other

## 2023-10-17 ENCOUNTER — Telehealth: Payer: Self-pay

## 2023-10-17 DIAGNOSIS — E291 Testicular hypofunction: Secondary | ICD-10-CM | POA: Diagnosis not present

## 2023-10-17 NOTE — Patient Outreach (Signed)
  Care Coordination   Initial Visit Note   10/17/2023 Name: Christian Cole MRN: [id number] DOB: Jan 15, 1951  Christian Cole is a 72 y.o. year old male who sees Dana Allan, MD for primary care. I spoke with  Lillia Mountain by phone today.  What matters to the patients health and wellness today?  Patient states he has HTN and high cholesterol that is being managed by his cardiologist and primary care provider.  He states he's had these conditions for approximately 15 years and they are under control.  Patient denies having any nursing or community resource needs at this time.  Verbalized appreciation for calling.     Goals Addressed             This Visit's Progress    COMPLETED: Care coordination activities - no follow up needed.       Interventions Today    Flowsheet Row Most Recent Value  General Interventions   General Interventions Discussed/Reviewed General Interventions Discussed  [Care coordination services discussed. SDOH survey completed. AWV discussed and patient advised to contact provider office to schedule. Discussed vaccines. Advised to contact PCP office if care coordination services needed in the future.]              SDOH assessments and interventions completed:  Yes  SDOH Interventions Today    Flowsheet Row Most Recent Value  SDOH Interventions   Food Insecurity Interventions Intervention Not Indicated  Housing Interventions Intervention Not Indicated  Transportation Interventions Intervention Not Indicated  Utilities Interventions Intervention Not Indicated        Care Coordination Interventions:  Yes, provided   Follow up plan: No further intervention required.   Encounter Outcome:  Patient Visit Completed   George Ina RN,BSN,CCM [facility]  [facility], [facility] coordinator / Case Manager Phone: [phone]

## 2023-10-18 ENCOUNTER — Encounter: Payer: Self-pay | Admitting: Urology

## 2023-10-18 LAB — TESTOSTERONE: Testosterone: >1500 ng/dL — ABNORMAL HIGH (ref 264–916)

## 2023-11-04 ENCOUNTER — Encounter: Payer: Self-pay | Admitting: Family Medicine

## 2023-11-11 ENCOUNTER — Other Ambulatory Visit: Payer: Self-pay | Admitting: Urology

## 2023-11-18 ENCOUNTER — Encounter: Payer: Self-pay | Admitting: Oncology

## 2023-11-20 ENCOUNTER — Encounter: Payer: Self-pay | Admitting: Oncology

## 2023-11-25 ENCOUNTER — Ambulatory Visit: Payer: Medicare Other | Attending: Cardiovascular Disease | Admitting: Cardiovascular Disease

## 2023-11-25 ENCOUNTER — Encounter: Payer: Self-pay | Admitting: Cardiovascular Disease

## 2023-11-25 VITALS — BP 128/84 | HR 58 | Ht 71.0 in | Wt 217.6 lb

## 2023-11-25 DIAGNOSIS — J432 Centrilobular emphysema: Secondary | ICD-10-CM | POA: Diagnosis not present

## 2023-11-25 DIAGNOSIS — I10 Essential (primary) hypertension: Secondary | ICD-10-CM | POA: Diagnosis not present

## 2023-11-25 DIAGNOSIS — G4733 Obstructive sleep apnea (adult) (pediatric): Secondary | ICD-10-CM | POA: Insufficient documentation

## 2023-11-25 DIAGNOSIS — I440 Atrioventricular block, first degree: Secondary | ICD-10-CM | POA: Diagnosis not present

## 2023-11-25 DIAGNOSIS — I700 Atherosclerosis of aorta: Secondary | ICD-10-CM | POA: Diagnosis not present

## 2023-11-25 DIAGNOSIS — I2510 Atherosclerotic heart disease of native coronary artery without angina pectoris: Secondary | ICD-10-CM | POA: Insufficient documentation

## 2023-11-25 DIAGNOSIS — Z951 Presence of aortocoronary bypass graft: Secondary | ICD-10-CM | POA: Diagnosis not present

## 2023-11-25 DIAGNOSIS — D696 Thrombocytopenia, unspecified: Secondary | ICD-10-CM | POA: Insufficient documentation

## 2023-11-25 NOTE — Progress Notes (Signed)
 March patient ID: Christian Cole, male   DOB: 05-Nov-1951, 73 y.o.   MRN: [id number]       HPI: Christian Cole is a 73 y.o. male the presents for a 2 month cardiology/sleep evaluation.  Mr Fellers has known CAD and underwent initial percutaneous coronary intervention in 1998 of an unknown vessel. He underwent CABG revascularization surgery in December 2000 with LIMA to LAD, LIMA to the PDA, vein to the diagonal, and then to the PLA. 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  An echo Doppler study in January 2002 showed normal systolic function. His mild TR and mild MR. A nuclear perfusion study in March 2013 showed normal perfusion without scar or ischemia.  When I saw him in 2016 he had continued to do well and remained active and denied any chest pain.   He he had seen Leita Lobstein in March 2016 and she had reduced his metoprolol  to 12.5 mg twice a day due to asymptomatic bradycardia.  He had blood work in December 2015 by his primary care physician and his total cholesterol was 166, HDL 32, LDL 104, VLDL 29 and triglycerides 149.  He has been maintained on atorvastatin  40 mg, Plavix  75 mg, pantoprazole  40 mg, as well as Viagra  as needed.  He underwent a nuclear perfusion study on April 26, 2015.  He remained asymptomatic.  He developed asymptomatic ST segment depression during stress.  Scintigraphic images were entirely normal.  He had normal function.  Ejection fraction was 52%.  He presents for evaluation.  When I saw him in October 2017 he was doing well and was without chest pain.  He was exercising daily.  He had lost over 40 pounds in the 8 months prior to that evaluation.  His peak weight was 242 and his weight this morning without clothes at home was 195.  He continues to use CPAP with 100% compliance.   He was evaluated by Laymon Qua in  October 2018.  His blood pressure was elevated and amlodipine  5 mg was added to his benazepril  and Lopressor .  He had resumed smoking and at that time was given a prescription for Chantix .  I saw him in December 2019 and prior to that evaluation he had  received a new CPAP machine which was prescribed by physician in [place].  He was sleeping well and cannot sleep without his CPAP.  Unfortunately he was still smoking 1/2 pack of cigarettes per day.  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  He walks at least 2 miles.  He denies any chest pain PND orthopnea.   He underwent carotid Dopplers in June 2020 which did not reveal any significant internal carotid stenoses in the 1 to 39% range.  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  A Myoview  study in July 2020 showed EF 55 to 60% without scar or ischemia.  He was seen by Scot Ford, PA for preoperative clearance and underwent successful inflatable penile prosthesis by Dr. Cameron at [facility] urology.  He tolerated surgery without cardiovascular compromise.  I saw him in February 2021 at which time he denied any anginal symptomatology.  Unfortunately he was still smoking.  He continued to be compliant with CPAP therapy and cannot sleep without it.  I saw him on January 09, 2021.  He remained stable and denied any chest  pain or shortness of breath but unfortunately was still smoking.   He has been working around the house remodeling his bathroom.  He has been on amlodipine  7.5 mg, benazepril  40 mg for hypertension, atorvastatin  40 mg and Zetia  for hyperlipidemia, and is on testosterone  injections every 14 days with continued low testosterone  levels.  He continues to use CPAP with 100% compliance.    I saw him on September 04, 2021 and since his prior evaluation he was evaluated at [facility] and presented with a warm sensation to the left side of his chest radiating to his neck.  It was not  exertionally precipitated.  A chest x-ray did not reveal any active cardiopulmonary disease.  His ECG did not show any ischemia.  Troponins were negative.  He believes he started to notice the symptoms when he was started on Flomax  and he was felt to have possible sensitivity to this.  He is now on silodosin  (Rapaflo ) and is tolerating this well.  He continues to be on amlodipine  7.5 mg daily and benazepril  40 mg for hypertension.  He is on atorvastatin  40 mg and Zetia  10 mg for hyperlipidemia.  He is on testosterone  supplementation every 2 weeks.  He continues to use CPAP therapy with [employer] as his DME company in [place].  A download was obtained from September 22 through September 01, 2021 which shows excellent compliance with average use at 8 hours and 16 minutes per night.  He has been using a Respironics DreamWear mask.  At 13 cm set pressure, AHI is 5.3.  He is still smoking at least a half a pack of cigarettes per day and his wife also smokes.  He has some discomfort from bilateral inguinal hernias for which he wears a truss.  During that evaluation, with his ongoing tobacco history, and chest pain symptomatology I recommended he undergo a Lexiscan  Myoview  study.  He was continuing to use CPAP with excellent compliance and cannot sleep without it.   On October 13, 2021 he underwent his Lexiscan  Myoview  study.  This was low risk and did not demonstrate any ischemia.  There was normal wall motion.    I saw him on November 20, 2021.  On October 13, 2021 he had undergone a Lexiscan  Myoview  study which was unchanged from previously and remained low risk without ischemia.  He had bilateral inguinal hernia as well as an umbilical hernia and was planning to undergo future surgery.  He continues to use CPAP with 100% compliance with his DME company called [employer] in [place]. He denied recurrent chest pain.  He was sleeping well.  He typically goes to bed at 10 PM and wakes up at 6:30 AM.  Download  was obtained of his CPAP unit from December 8 through November 17, 2021.  Compliance is excellent with average use of 7 hours and 27 minutes.  His current CPAP unit is not an auto unit and is set at a pressure of 14 cm.  AHI was slightly increased at 6.5.  He continues to be on amlodipine  and benazepril  for blood pressure control.  He is on testosterone  injections every 2 weeks and often undergoes phlebotomy followed by hematology.  His next phlebotomy scheduled for January 11.    I saw  him on May 09, 2022. He underwent open left inguinal hernia repair, robotic assisted right inguinal hernia repair, and open umbilical hernia repair on January 11, 2022 without cardiovascular compromise.  He also undergoes hematologic evaluation for his thrombocytopenia and  erythrocytosis and undergoes periodic phlebotomy.  He denies any chest pain or shortness of breath.  He remains active.  He continues to use CPAP and a download from May 28 through May 07, 2022 confirms excellent compliance with average use at 7 hours and 18 minutes per night.  However, his CPAP is set at a pressure of 16 cm.  AHI is increased at 8.2.  He does not have a CPAP auto unit.  At that evaluation I added EPR of 3 to his device.  We discussed after 5 years he would qualify for a new machine.  I saw him on February 07, 2023 at which time he felt well. 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  He has continued to use CPAP with 100% compliance which I was to verify on his phone recordings.  Apparently, his unit stopped being wireless since March 10.  His current CPAP machine was set up on January 21, 2018 and is an AirSense 10 CPAP unit and does not have auto capability.  His DME company is now [employer].  He continues to be on amlodipine  10 mg, benazepril  40 mg for hypertension.  He is on Zetia  10 mg and atorvastatin  40 mg for hyperlipidemia.  He takes pantoprazole  for GERD.  He continues to be on  clopidogrel  75 mg daily.  During that evaluation, since it was over 5 years since receiving his ResMed AirSense 10 CPAP unit which did not have an auto mode I recommended he receive a ResMed AirSense 11 AutoSet unit and recommended a set up with EPR of 3 with pressure range of 15 to 20 cm of water.  I last saw him on August 22, 2023.  Since his prior evaluation he never received a new CPAP machine.  His current machine having received from [employer] in [place].  He continues to be compliant and has been getting supplies from [employer].  His insurance is Medicare/[employer] for life.  Most recent compliance data showed 100% use and at 16 cm set pressure AHI was 4.1.  Epworth Sleepiness Scale score endorsed at 1.  Our sleep coordinator was away this week and I was unable gather additional data.  He continues to be active.  His initial blood pressure on arriving to the office was elevated but on repeat by me today was stable.  He is walking daily at 3.5 mph.  He is no longer on metoprolol  which was stopped several years ago.  He continues to be on amlodipine  10 mg, benazepril  40 mg for hypertension.  He continues to be on clopidogrel  75 mg.  He takes pantoprazole  for GERD.  During that evaluation, he began qualified for a new machine and remained compliant.  Order was written to expedite him getting a new ResMed AirSense 11 AutoSet unit with initial pressure range set up at 15 to 20 cm of water with EPR of 3.  Mr. Shorey received a new ResMed AirSense 11 AutoSet unit on September 09, 2023 which is his third CPAP machine.  His machine is set with ramp off at a pressure range of 15 to 20 cm.  Download from December 13 through November 23, 2023 shows excellent compliance with 100% use with average use of 8 hours and 31 minutes.  AHI is increased at 6.8 with his current setting and his 95th percentile pressure 17.9 with maximum average pressure at 18.7.  He is tolerating it well and cannot sleep without  it.  He  continues to be on amlodipine  10 mg and benazepril  40 mg for hypertension.  He is on atorvastatin  40 mg and Zetia  10 mg for hyperlipidemia.  He is on clopidogrel  75 mg daily for antiplatelet therapy and takes pantoprazole  40 mg daily.  Although his BMI is 30.35, his primary care physician just started him on Wegovy  skin injection for additional weight loss per his request.  He presents for evaluation.  Past Medical History:  Diagnosis Date   Aortic atherosclerosis (HCC)    BPH (benign prostatic hyperplasia)    CAD (coronary artery disease)    a. s/p CABG in 2000 with LIMA-LAD, RIMA-PDA, SVG-D1, and SVG-PLA b. low-risk NST in 2016   Carotid artery stenosis    Cyst of right kidney    ED (erectile dysfunction)    a.) penile implant in place   Fatty liver    GERD (gastroesophageal reflux disease)    Hyperlipidemia    Hypertension    Long term current use of antithrombotics/antiplatelets    a.) clopidogrel    Low testosterone     a.) on exogenous TRT   OSA on CPAP    Pre-diabetes    S/P CABG x 4 2000   a.) LIMA-LAD, RIMA-PDA, SVG-D1, SVG-PLA   Secondary erythrocytosis    a.) smoking + TRT + OSAH   Sinus bradycardia    Skin cancer    Thrombocytopenia (HCC)    Tongue lesion    benign from trauma as of 06/20/21    Past Surgical History:  Procedure Laterality Date   CATARACT EXTRACTION, BILATERAL  2010   COLONOSCOPY WITH PROPOFOL  N/A 04/15/2020   Procedure: COLONOSCOPY WITH PROPOFOL ;  Surgeon: Dessa Reyes ORN, MD;  Location: [facility] ENDOSCOPY;  Service: Endoscopy;  Laterality: N/A;   CORONARY ARTERY BYPASS GRAFT  2000   LIMA to the LAD,RIMA to the PDA,vein graft to the diagonal & vein graft to the PLA   ESOPHAGOGASTRODUODENOSCOPY     Dr. Gaylyn   ESOPHAGOGASTRODUODENOSCOPY  04/15/2020   Procedure: ESOPHAGOGASTRODUODENOSCOPY (EGD);  Surgeon: Dessa Reyes ORN, MD;  Location: [facility] ENDOSCOPY;  Service: Endoscopy;;   HOLEP-LASER ENUCLEATION OF THE PROSTATE WITH MORCELLATION N/A  11/27/2021   Procedure: HOLEP-LASER ENUCLEATION OF THE PROSTATE WITH MORCELLATION;  Surgeon: Penne Knee, MD;  Location: [facility] ORS;  Service: Urology;  Laterality: N/A;   INGUINAL HERNIA REPAIR Left 01/11/2022   Procedure: HERNIA REPAIR INGUINAL ADULT;  Surgeon: Desiderio Schanz, MD;  Location: [facility] ORS;  Service: General;  Laterality: Left;   KNEE ARTHROSCOPY Right 1991   KNEE DEBRIDEMENT Right 1989   Fluid flush   PENILE PROSTHESIS IMPLANT     06/26/19 Dr. Levonia   SHOULDER ARTHROSCOPY Left 2011   UMBILICAL HERNIA REPAIR N/A 01/11/2022   Procedure: HERNIA REPAIR UMBILICAL ADULT;  Surgeon: Desiderio Schanz, MD;  Location: [facility] ORS;  Service: General;  Laterality: N/A;   US  ECHOCARDIOGRAPHY  12/12/2010   mild LA dilatation, normal LV systolic fx, EF > 55%   XI ROBOTIC ASSISTED INGUINAL HERNIA REPAIR WITH MESH Right 01/11/2022   Procedure: XI ROBOTIC ASSISTED INGUINAL HERNIA REPAIR WITH MESH;  Surgeon: Desiderio Schanz, MD;  Location: [facility] ORS;  Service: General;  Laterality: Right;    No Known Allergies   Current Outpatient Medications  Medication Sig Dispense Refill   amLODipine  (NORVASC ) 10 MG tablet Take 1 tablet (10 mg total) by mouth every evening. 90 tablet 3   atorvastatin  (LIPITOR) 40 MG tablet TAKE 1 TABLET(40 MG) BY MOUTH DAILY AT NIGHT 90 tablet  3   B-D 3CC LUER-LOK SYR 23GX1-1/2 23G X 1-1/2 3 ML MISC Use once every 14 days 50 each 5   benazepril  (LOTENSIN ) 40 MG tablet Take 1 tablet (40 mg total) by mouth every evening.     clopidogrel  (PLAVIX ) 75 MG tablet Take 1 tablet (75 mg total) by mouth every evening.     ezetimibe  (ZETIA ) 10 MG tablet TAKE 1 TABLET(10 MG) BY MOUTH DAILY 90 tablet 3   ibuprofen (ADVIL) 200 MG tablet Take 400 mg by mouth every 8 (eight) hours as needed (pain.).     Multiple Vitamins-Minerals (MULTIVITAMIN WITH MINERALS) tablet Take 1 tablet by mouth every evening. Men 50+     NEEDLE, DISP, 18 G (B-D HYPODERMIC NEEDLE 18GX1.5) 18G X 1-1/2 MISC DRAW UP MEDICATION  WITH THIS NEEDLE AS DIRECTED 30 each 0   pantoprazole  (PROTONIX ) 40 MG tablet Take 1 tablet (40 mg total) by mouth every evening. TAKE 1 TABLET BY MOUTH EVERY DAY 30 MINUTES BEFORE A MEAL 90 tablet 3   testosterone  cypionate (DEPOTESTOSTERONE CYPIONATE) 200 MG/ML injection ADMINISTER 1.5 ML(300 MG) IN THE MUSCLE EVERY 14 DAYS 10 mL 0   WEGOVY  0.25 MG/0.5ML SOAJ Inject 0.25 mg into the skin once a week.     No current facility-administered medications for this visit.    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  Tobacco Use   Smoking status: Every Day    Current packs/day: 0.50    Average packs/day: 0.5 packs/day for 44.0 years (22.0 ttl pk-yrs)    Types: Cigarettes   Smokeless tobacco: Never   Tobacco comments:    on and off smoker   Vaping Use   Vaping status: Never Used  Substance and Sexual Activity   Alcohol use: Yes    Alcohol/week: 10.0 standard drinks of alcohol    Types: 10 Cans of beer per week    Comment: Down to 3 beers a day from 4-6 a day    Drug use: No   Sexual activity: Yes    Partners: Female    Comment: Wife  Other Topics Concern   Not on file  Social History Narrative   Trask grew up in [place] . He is currently living in [place] with his wife. This is his second marriage. He has 1 daughter and 2 sons from his first marriage. He has 4 step kids. His daughter lives in [place] , one son in [place], [place] and the other son in [place], [place]. 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       He worked Designer, Industrial/product for a [employer]    He enjoys wood working on his spare time. 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 He also enjoys shooting sports - target  shooting and [employer] Herbalist).       He retired 04/2019 and enjoys this       2 brothers and 2 sisters he is next to youngest    Social Drivers of Corporate Investment Banker Strain: Low Risk  (09/26/2023)   Overall Financial Resource Strain (CARDIA)    Difficulty of Paying Living Expenses: Not hard at all  Food  Insecurity: No Food Insecurity (10/17/2023)   Hunger Vital Sign    Worried About Running Out of Food in the Last Year: Never true    Ran Out of Food in the Last Year: Never true  Transportation Needs: No Transportation Needs (10/17/2023)   PRAPARE - Administrator, Civil Service (Medical): No    Lack of Transportation (Non-Medical): No  Physical Activity: Insufficiently Active (09/26/2023)   Exercise Vital Sign    Days of Exercise per Week: 6 days    Minutes of Exercise per Session: 20 min  Stress: No Stress Concern Present (09/26/2023)   [employer] of Occupational Health - Occupational Stress Questionnaire    Feeling of Stress : Not at all  Social Connections: Moderately Isolated (09/26/2023)   Social Connection and Isolation Panel [NHANES]    Frequency of Communication with Friends and Family: More than three times a week    Frequency of Social Gatherings with Friends and Family: Once a week    Attends Religious Services: Never    Database Administrator or Organizations: No    Attends Banker Meetings: Never    Marital Status: Married  Catering Manager Violence: Not At Risk (10/17/2023)   Humiliation, Afraid, Rape, and Kick questionnaire    Fear of Current or Ex-Partner: No    Emotionally Abused: No    Physically Abused: No    Sexually Abused: No    Family History  Problem Relation Age of Onset   Arthritis Mother    Heart disease Father        CAD - MI   Stroke Father    Hypertension Father    Cancer Father 20       esophageal cancer    Arthritis Maternal Grandmother     ROS General: Negative; No fevers,  chills, or night sweats;  HEENT: Negative; No changes in vision or hearing, sinus congestion, difficulty swallowing Pulmonary: Negative; No cough, wheezing, shortness of breath, hemoptysis Cardiovascular: See history of present illness GI: Negative; No nausea, vomiting, diarrhea, or abdominal pain GU: Low testosterone  no dysuria, hematuria, or difficulty voiding Musculoskeletal: Negative; no myalgias, joint pain, or weakness Hematologic/Oncology: History of erythrocytosis and thrombocytopenia. 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  No snoring, daytime sleepiness, hypersomnolence, bruxism, restless legs, hypnogognic hallucinations, no cataplexy  An Epworth Sleepiness Scale score was calculated in the office today, November 25, 2023 , which endorsed at 1 arguing against residual daytime sleepiness.  Other comprehensive 14 point system review is negative.  PE BP 128/84   Pulse (!) 58   Ht 5' 11 (1.803 m)   Wt 217 lb 9.6 oz (98.7 kg)   SpO2 96%   BMI 30.35 kg/m    Repeat blood pressure was 136/80.  He tells me his blood pressure at home typically is around 120 over the upper 70s.  Wt Readings from Last 3 Encounters:  11/25/23 217 lb 9.6 oz (98.7 kg)  09/30/23 214 lb 8 oz (97.3 kg)  08/22/23 215 lb 3.2 oz (97.6 kg)   General: Alert, oriented, no distress.  Skin: normal turgor, no rashes, warm and dry HEENT: Normocephalic, atraumatic. Pupils equal round and reactive to light; sclera anicteric; extraocular muscles intact;  Nose without nasal septal hypertrophy Mouth/Parynx benign; Mallinpatti scale Neck: No JVD, no  carotid bruits; normal carotid upstroke Lungs: clear to ausculatation and percussion; no wheezing or rales Chest  wall: without tenderness to palpitation Heart: PMI not displaced, RRR, s1 s2 normal, 1/6 systolic murmur, no diastolic murmur, no rubs, gallops, thrills, or heaves Abdomen: soft, nontender; no hepatosplenomehaly, BS+; abdominal aorta nontender and not dilated by palpation. Back: no CVA tenderness Pulses 2+ Musculoskeletal: full range of motion, normal strength, no joint deformities Extremities: no clubbing cyanosis or edema, Homan's sign negative  Neurologic: grossly nonfocal; Cranial nerves grossly wnl Psychologic: Normal mood and affect  EKG Interpretation Date/Time:  Monday November 25 2023 09:27:40 EST Ventricular Rate:  58 PR Interval:  332 QRS Duration:  104 QT Interval:  420 QTC Calculation: 412 R Axis:   8  Text Interpretation: Sinus bradycardia with 1st degree A-V block When compared with ECG of 22-Aug-2023 11:25, No significant change was found Confirmed by Burnard Ned ([id number]) on 11/26/2023 10:42:29 AM    February 07, 2023 ECG (independently read by me): Sinus rhythm at 65, 1st degree AV block  May 09, 2022 ECG (independently read by me): Sinus bradycardia at 55, 1st degree AV block, PR 248 msec  November 20, 2021 ECG (independently read by me): Sinus bradycardia at 56, 1st degree AV block; PR 246 msec  September 04, 2021 ECG (independently read by me): Sinus bradycardia 55, PR 210 msec, inferior T wave changes and Q wave III, aVF,   January 09, 2021 ECG (independently read by me): Sinus bradycardia at 53; 1st degree block, PR 222 msec, borderline LVH  February 2021 ECG (independently read by me): Sinus bradycardia at 58 bpm, first-degree AV block with a PR interval at 244 ms.  No significant ST-T changes.  QTc interval 431 ms  December 2019 ECG (independently read by me): Sinus bradycardia at 56 bpm.  First-degree block with PR interval 264 ms.  QTc interval 443 ms  October 2017 ECG (independently read by me): Sinus bradycardia at 44 bpm with first degree AV block.  Q wave in  leads III and F.  PR interval 224 ms.  June 2016 ECG (independently read by me): not done today due to recent treadmill nuclear study  ECG (independently read by me): Sinus bradycardia at 45 beats per minute. Normal intervals. Q waves in lead 3 and small Q wave in aVF.  LABS:    Latest Ref Rng & Units 09/30/2023   10:14 AM 03/21/2023   10:09 AM 08/16/2022    1:20 PM  BMP  Glucose 70 - 99 mg/dL 895  898  899   BUN 6 - 23 mg/dL 15  15  16    Creatinine 0.40 - 1.50 mg/dL 8.86  8.83  8.86   Sodium 135 - 145 mEq/L 140  139  139   Potassium 3.5 - 5.1 mEq/L 4.5  4.4  4.7   Chloride 96 - 112 mEq/L 104  104  106   CO2 19 - 32 mEq/L 27  25  28    Calcium  8.4 - 10.5 mg/dL 9.4  9.0  8.9       Latest Ref Rng & Units 09/30/2023   10:14 AM 03/21/2023   10:09 AM 08/16/2022    1:20 PM  Hepatic Function  Total Protein 6.0 - 8.3 g/dL 6.8  6.5  6.6   Albumin 3.5 - 5.2 g/dL 4.5  4.2  4.1   AST 0 - 37 U/L 22  21  28    ALT 0 - 53 U/L 20  18  25  Alk Phosphatase 39 - 117 U/L 80  67  69   Total Bilirubin 0.2 - 1.2 mg/dL 0.8  0.8  1.0       Latest Ref Rng & Units 08/20/2023   12:32 PM 04/15/2023    8:58 AM 02/18/2023    1:20 PM  CBC  WBC 4.0 - 10.5 K/uL 7.3   8.2   Hemoglobin 13.0 - 17.0 g/dL 83.2   82.5   Hematocrit 39.0 - 52.0 % 50.2  48.7  51.4   Platelets 150 - 400 K/uL 126   141    Lab Results  Component Value Date   MCV 93.7 08/20/2023   MCV 95.4 02/18/2023   MCV 93.3 08/16/2022   Lab Results  Component Value Date   TSH 1.50 01/03/2022   Lab Results  Component Value Date   HGBA1C 5.9 03/21/2023    Lipid Panel     Component Value Date/Time   CHOL 135 03/21/2023 1009   CHOL 167 12/17/2019 1113   TRIG 64.0 03/21/2023 1009   HDL 65.70 03/21/2023 1009   HDL 86 12/17/2019 1113   CHOLHDL 2 03/21/2023 1009   VLDL 12.8 03/21/2023 1009   LDLCALC 57 03/21/2023 1009   LDLCALC 65 12/17/2019 1113   LDLDIRECT 91.6 01/06/2014 1443    RADIOLOGY: No results found.  IMPRESSION: 1. Primary  hypertension   2. Hx of CABG: December 2000   3. Coronary artery disease involving native coronary artery of native heart without angina pectoris   4. OSA on CPAP   5. First degree AV block   6. Thrombocytopenia (HCC)   7. Aortic atherosclerosis (HCC)   8. Centrilobular emphysema (HCC)     ASSESSMENT AND PLAN: Mr. Redford Behrle is a 73 year-old gentleman who has a history of CAD and underwent initial intervention in 1998. Due to progressive CAD he underwent CABG surgery in 2000. A nuclear perfusion study in 2013 remained normal without scar or ischemia.  A three-year follow-up nuclear perfusion study on 04/26/2015 continued to show entirely normal perfusion without scar or ischemia.  However, he developed asymptomatic ST depression during stress.  For this reason, it was interpreted as intermediate study.  Post-rest ejection fraction was 52%.  A repeat Myoview  in July 2020 EF 55 to 60% was without ischemia or scar.  He underwent successful penile implant surgery without cardiovascular compromise.  Unfortunately he continued to smoke cigarettes and developed atypical chest pain symptomatology.  A subsequent Lexiscan  Myoview  study on October 13, 2021 was unchanged and remained low risk without ischemia.  At his cardiology visit with me in January 2023, he was given cardiology clearance and underwent successful bilateral inguinal hernia surgery as well as umbilical surgery on January 11, 2022 without cardiovascular compromise.  His blood pressure has remained stable currently on his regimen of amlodipine  10 mg and benazepril  40 mg daily.  He is on atorvastatin  40 mg and Zetia  10 mg for hyperlipidemia.  Lipid studies in September 2023 showed total cholesterol 130 triglycerides 94 HDL 59 and LDL 52.  Most recent lipid studies from Mar 21, 2023 showed LDL at 57 total cholesterol 135 HDL 65.7 and triglycerides at 64.  He has had stable mild thrombocytopenia with platelet count in October 2023 at 133.  His blood  pressure today remains stable on his current regimen and he tells me at home typical systolic pressure runs in the 879d.  He has been trying to lose weight.  He discussed weight loss with his primary care  physician Dr. Glenys Ferrari and he just started injection with Wegovy .  BMI is only 30.35.  He has first-degree AV block which is stable.  ECG today reveals sinus bradycardia 58 bpm.  He has been on CPAP therapy for many years and just received his third CPAP device, a ResMed air sense 11 AutoSet unit with set up date on September 09, 2023.  He is meeting compliance standards on his download from October 25, 2023 to November 23, 2023.  His unit is set at a pressure range of 15 to 20 cm and AHI continues to be mildly elevated at 6.8/h.  95th percentile pressure is 17.9 with maximum average pressure 18.70.  As a result, I will change his CPAP settings to a range of 17 to 20 cm of water.  He cannot sleep without CPAP.  There is no residual daytime sleepiness.  I discussed with him my plans for future retirement in June 2025.  Since he lives in the [place] area, I will transfer him to the cardiology care of Dr. Lonni End in our [place] office.  Following my retirement he will need to be transition to another sleep provider.  I answered all his questions I will be available if any problems arise prior to my retirement.   Debby RONAL Sor, MD, [facility], [facility], [employer] Board of Sleep Medicine  11/26/2023 10:50 AM

## 2023-11-25 NOTE — Patient Instructions (Signed)
 Medication Instructions:  The current medical regimen is effective. Continue present plan and medications as directed. Please refer to the Current Medication list given to you today.   *If you need a refill on your cardiac medications before your next appointment, please call your pharmacy*   Lab Work: No labs were ordered during today's visit.  If you have labs (blood work) drawn today and your tests are completely normal, you will receive your results only by: MyChart Message (if you have MyChart) OR A paper copy in the mail If you have any lab test that is abnormal or we need to change your treatment, we will call you to review the results.   Testing/Procedures: No procedures ordered today.    Follow-Up: At [facility], you and your health needs are our priority.  As part of our continuing mission to provide you with exceptional heart care, we have created designated Provider Care Teams.  These Care Teams include your primary Cardiologist (physician) and Advanced Practice Providers (APPs -  Physician Assistants and Nurse Practitioners) who all work together to provide you with the care you need, when you need it.  We recommend signing up for the patient portal called MyChart.  Sign up information is provided on this After Visit Summary.  MyChart is used to connect with patients for Virtual Visits (Telemedicine).  Patients are able to view lab/test results, encounter notes, upcoming appointments, etc.  Non-urgent messages can be sent to your provider as well.   To learn more about what you can do with MyChart, go to [email or web address].    Your next appointment:   8 month(s)  Provider:   Lonni Hanson, MD    Other Instructions If you have any questions or concerns regarding your c-pap, bi-pap or sleep accessories, please contact Brandie Rorie at [phone].   Thank you for choosing [facility]!

## 2023-11-26 ENCOUNTER — Encounter: Payer: Self-pay | Admitting: Cardiovascular Disease

## 2023-12-04 ENCOUNTER — Encounter: Payer: Self-pay | Admitting: Family Medicine

## 2023-12-04 ENCOUNTER — Encounter: Payer: Self-pay | Admitting: Oncology

## 2023-12-04 ENCOUNTER — Ambulatory Visit (INDEPENDENT_AMBULATORY_CARE_PROVIDER_SITE_OTHER): Payer: Medicare Other | Admitting: Family Medicine

## 2023-12-04 VITALS — BP 122/68 | HR 63 | Temp 98.2°F | Resp 18 | Ht 71.0 in | Wt 214.0 lb

## 2023-12-04 DIAGNOSIS — I2510 Atherosclerotic heart disease of native coronary artery without angina pectoris: Secondary | ICD-10-CM | POA: Diagnosis not present

## 2023-12-04 DIAGNOSIS — K760 Fatty (change of) liver, not elsewhere classified: Secondary | ICD-10-CM

## 2023-12-04 DIAGNOSIS — E669 Obesity, unspecified: Secondary | ICD-10-CM | POA: Diagnosis not present

## 2023-12-04 DIAGNOSIS — G4733 Obstructive sleep apnea (adult) (pediatric): Secondary | ICD-10-CM | POA: Diagnosis not present

## 2023-12-04 DIAGNOSIS — I700 Atherosclerosis of aorta: Secondary | ICD-10-CM

## 2023-12-04 DIAGNOSIS — I10 Essential (primary) hypertension: Secondary | ICD-10-CM | POA: Diagnosis not present

## 2023-12-04 DIAGNOSIS — E663 Overweight: Secondary | ICD-10-CM | POA: Diagnosis not present

## 2023-12-04 MED ORDER — SEMAGLUTIDE-WEIGHT MANAGEMENT 0.5 MG/0.5ML ~~LOC~~ SOAJ: 0.5000 mg | SUBCUTANEOUS | 0 refills | Status: DC

## 2023-12-04 NOTE — Patient Instructions (Addendum)
It was a pleasure meeting you today. Thank you for allowing me to take part in your health care.  Our goals for today as we discussed include:  Increase Wegovy to 0.5 mg weekly Notify MD if any side effects Continue healthy lifestyle and activity as tolerated  Follow up in May as scheduled   This is a list of the screening recommended for you and due dates:  Health Maintenance  Topic Date Due   Medicare Annual Wellness Visit  Never done   Zoster (Shingles) Vaccine (2 of 2) 01/09/2024*   Flu Shot  02/10/2024*   DTaP/Tdap/Td vaccine (1 - Tdap) 10/08/2024*   Screening for Lung Cancer  03/14/2024   Colon Cancer Screening  04/15/2025   Pneumonia Vaccine  Completed   Hepatitis C Screening  Completed   HPV Vaccine  Aged Out   COVID-19 Vaccine  Discontinued  *Topic was postponed. The date shown is not the original due date.      If you have any questions or concerns, please do not hesitate to call the office at [phone].  I look forward to our next visit and until then take care and stay safe.  Regards,   Dana Allan, MD   [facility]

## 2023-12-04 NOTE — Progress Notes (Signed)
SUBJECTIVE:   Chief Complaint  Patient presents with   Medication Dose Change   HPI Presents for follow up weight loss management  Discussed the use of AI scribe software for clinical note transcription with the patient, who gave verbal consent to proceed.  History of Present Illness The patient, currently on Wegovy for weight management, reports having taken three doses of the medication. He experienced a delay in receiving the medication due to supply issues. The patient reports no significant side effects, except for some nausea and a decrease in appetite. He also notes a reduction in his food intake and initial constipation, which has since resolved. The patient has noticed a weight loss of approximately four pounds since starting the medication.  The patient expresses a willingness to increase the dosage of Wegovy to potentially accelerate weight loss. He has been following a high protein, low carbohydrate diet and has previously lost thirty pounds following this dietary regimen. The patient is aware of the need to maintain adequate protein intake to prevent muscle mass loss during weight reduction.  He denies any chest pain or shortness of breath and reports a recent cardiology consultation with favorable findings. The patient does not require refills on any other medications at this time.      PERTINENT PMH / PSH: As above  OBJECTIVE:  BP 122/68   Pulse 63   Temp 98.2 F (36.8 C)   Resp 18   Ht 5\' 11"  (1.803 m)   Wt 214 lb (97.1 kg)   SpO2 97%   BMI 29.85 kg/m    Physical Exam Vitals reviewed.  Constitutional:      General: He is not in acute distress.    Appearance: Normal appearance. He is normal weight. He is not ill-appearing, toxic-appearing or diaphoretic.  Eyes:     General:        Right eye: No discharge.        Left eye: No discharge.  Cardiovascular:     Rate and Rhythm: Normal rate and regular rhythm.     Heart sounds: Normal heart sounds.   Pulmonary:     Effort: Pulmonary effort is normal.     Breath sounds: Normal breath sounds.  Musculoskeletal:        General: Normal range of motion.     Cervical back: Normal range of motion.  Skin:    General: Skin is warm and dry.  Neurological:     Mental Status: He is alert and oriented to person, place, and time. Mental status is at baseline.  Psychiatric:        Mood and Affect: Mood normal.        Behavior: Behavior normal.        Thought Content: Thought content normal.        Judgment: Judgment normal.           12/04/2023    8:40 AM 09/30/2023    9:41 AM 04/23/2023    8:16 AM 02/20/2023    1:03 PM 08/03/2022    9:44 AM  Depression screen PHQ 2/9  Decreased Interest 0 0 0 0 0  Down, Depressed, Hopeless 0 0 0 0 0  PHQ - 2 Score 0 0 0 0 0  Altered sleeping 0 0 0    Tired, decreased energy 0 0 0    Change in appetite 0 0 0    Feeling bad or failure about yourself  0 0 0    Trouble concentrating 0 0  0    Moving slowly or fidgety/restless 0 0 0    Suicidal thoughts 0 0 0    PHQ-9 Score 0 0 0    Difficult doing work/chores Not difficult at all Not difficult at all Not difficult at all        12/04/2023    8:40 AM 09/30/2023    9:41 AM 04/23/2023    8:16 AM 02/20/2023    1:03 PM  GAD 7 : Generalized Anxiety Score  Nervous, Anxious, on Edge 0 0 0 0  Control/stop worrying 0 0 0 0  Worry too much - different things 0 0 0 0  Trouble relaxing 0 0 0 0  Restless 0 0 0 0  Easily annoyed or irritable 0 0 0 0  Afraid - awful might happen 0 0 0 0  Total GAD 7 Score 0 0 0 0  Anxiety Difficulty Not difficult at all Not difficult at all Not difficult at all Not difficult at all    ASSESSMENT/PLAN:  Primary hypertension Assessment & Plan: Well-controlled with current medication regimen. . -Continue  Amlodipine 10 mg at night -Continue Benzapril 40 mg at night     Orders: -     Semaglutide-Weight Management; Inject 0.5 mg into the skin once a week.  Dispense: 2 mL;  Refill: 0  Coronary artery disease involving native coronary artery of native heart without angina pectoris -     Semaglutide-Weight Management; Inject 0.5 mg into the skin once a week.  Dispense: 2 mL; Refill: 0  Aortic atherosclerosis (HCC) -     Semaglutide-Weight Management; Inject 0.5 mg into the skin once a week.  Dispense: 2 mL; Refill: 0  Overweight (BMI 25.0-29.9) -     Semaglutide-Weight Management; Inject 0.5 mg into the skin once a week.  Dispense: 2 mL; Refill: 0  Fatty liver -     Semaglutide-Weight Management; Inject 0.5 mg into the skin once a week.  Dispense: 2 mL; Refill: 0  OSA (obstructive sleep apnea) -     Semaglutide-Weight Management; Inject 0.5 mg into the skin once a week.  Dispense: 2 mL; Refill: 0  Obesity (BMI 30-39.9) Assessment & Plan: Tolerating Wegovy 0.25mg  weekly with mild nausea, decreased appetite, and initial constipation. Lost 4 pounds since starting the medication. Patient is on a high protein, low carb diet. -Increase Wegovy to 0.5mg  weekly after finishing the current 0.25mg  dose. -Check in 3 weeks to assess tolerance and weight loss. -If weight loss plateaus, consider further dose increase. -Plan for 53-month follow-up appointment.     PDMP reviewed  Return in about 4 months (around 03/30/2024) for PCP, Weight management, 6 monthe F/U.  Dana Allan, MD

## 2023-12-15 ENCOUNTER — Encounter: Payer: Self-pay | Admitting: Family Medicine

## 2023-12-15 NOTE — Assessment & Plan Note (Signed)
Well-controlled with current medication regimen. . -Continue  Amlodipine 10 mg at night -Continue Benzapril 40 mg at night

## 2023-12-15 NOTE — Assessment & Plan Note (Signed)
Tolerating Wegovy 0.25mg  weekly with mild nausea, decreased appetite, and initial constipation. Lost 4 pounds since starting the medication. Patient is on a high protein, low carb diet. -Increase Wegovy to 0.5mg  weekly after finishing the current 0.25mg  dose. -Check in 3 weeks to assess tolerance and weight loss. -If weight loss plateaus, consider further dose increase. -Plan for 84-month follow-up appointment.

## 2023-12-24 ENCOUNTER — Ambulatory Visit: Payer: Medicare Other | Admitting: Cardiovascular Disease

## 2023-12-27 ENCOUNTER — Encounter: Payer: Self-pay | Admitting: Family Medicine

## 2023-12-27 ENCOUNTER — Other Ambulatory Visit: Payer: Self-pay | Admitting: Family Medicine

## 2023-12-27 DIAGNOSIS — I700 Atherosclerosis of aorta: Secondary | ICD-10-CM

## 2023-12-27 DIAGNOSIS — I10 Essential (primary) hypertension: Secondary | ICD-10-CM

## 2023-12-27 DIAGNOSIS — E663 Overweight: Secondary | ICD-10-CM

## 2023-12-27 DIAGNOSIS — K760 Fatty (change of) liver, not elsewhere classified: Secondary | ICD-10-CM

## 2023-12-27 DIAGNOSIS — G4733 Obstructive sleep apnea (adult) (pediatric): Secondary | ICD-10-CM

## 2023-12-27 DIAGNOSIS — I2510 Atherosclerotic heart disease of native coronary artery without angina pectoris: Secondary | ICD-10-CM

## 2023-12-27 MED ORDER — SEMAGLUTIDE-WEIGHT MANAGEMENT 1 MG/0.5ML ~~LOC~~ SOAJ: 1.0000 mg | SUBCUTANEOUS | 1 refills | Status: DC

## 2024-02-03 ENCOUNTER — Other Ambulatory Visit: Payer: Self-pay | Admitting: Urology

## 2024-02-07 DIAGNOSIS — C44622 Squamous cell carcinoma of skin of right upper limb, including shoulder: Secondary | ICD-10-CM | POA: Diagnosis not present

## 2024-02-07 DIAGNOSIS — D2262 Melanocytic nevi of left upper limb, including shoulder: Secondary | ICD-10-CM | POA: Diagnosis not present

## 2024-02-07 DIAGNOSIS — L570 Actinic keratosis: Secondary | ICD-10-CM | POA: Diagnosis not present

## 2024-02-07 DIAGNOSIS — Z85828 Personal history of other malignant neoplasm of skin: Secondary | ICD-10-CM | POA: Diagnosis not present

## 2024-02-07 DIAGNOSIS — D485 Neoplasm of uncertain behavior of skin: Secondary | ICD-10-CM | POA: Diagnosis not present

## 2024-02-07 DIAGNOSIS — D225 Melanocytic nevi of trunk: Secondary | ICD-10-CM | POA: Diagnosis not present

## 2024-02-07 DIAGNOSIS — D2272 Melanocytic nevi of left lower limb, including hip: Secondary | ICD-10-CM | POA: Diagnosis not present

## 2024-02-07 DIAGNOSIS — D2261 Melanocytic nevi of right upper limb, including shoulder: Secondary | ICD-10-CM | POA: Diagnosis not present

## 2024-02-18 ENCOUNTER — Inpatient Hospital Stay: Payer: Medicare Other

## 2024-02-18 ENCOUNTER — Inpatient Hospital Stay: Payer: Medicare Other | Admitting: Oncology

## 2024-02-18 ENCOUNTER — Inpatient Hospital Stay: Payer: Medicare Other | Attending: Oncology

## 2024-02-18 DIAGNOSIS — D751 Secondary polycythemia: Secondary | ICD-10-CM | POA: Insufficient documentation

## 2024-02-18 DIAGNOSIS — D696 Thrombocytopenia, unspecified: Secondary | ICD-10-CM | POA: Insufficient documentation

## 2024-02-18 DIAGNOSIS — Z800 Family history of malignant neoplasm of digestive organs: Secondary | ICD-10-CM | POA: Insufficient documentation

## 2024-02-19 ENCOUNTER — Telehealth: Payer: Self-pay | Admitting: Oncology

## 2024-02-19 NOTE — Telephone Encounter (Signed)
 Patient left a voicemail requesting a follow up appointment. Last seen 4/8- I didn't see a wrap up for him. Please advise scheduling

## 2024-02-26 ENCOUNTER — Encounter: Payer: Self-pay | Admitting: Family Medicine

## 2024-02-27 ENCOUNTER — Other Ambulatory Visit: Payer: Self-pay | Admitting: Family Medicine

## 2024-02-27 ENCOUNTER — Encounter: Payer: Self-pay | Admitting: Urology

## 2024-02-27 DIAGNOSIS — I10 Essential (primary) hypertension: Secondary | ICD-10-CM

## 2024-02-27 DIAGNOSIS — E785 Hyperlipidemia, unspecified: Secondary | ICD-10-CM

## 2024-02-27 DIAGNOSIS — E669 Obesity, unspecified: Secondary | ICD-10-CM

## 2024-02-27 DIAGNOSIS — G4733 Obstructive sleep apnea (adult) (pediatric): Secondary | ICD-10-CM

## 2024-02-27 MED ORDER — SEMAGLUTIDE-WEIGHT MANAGEMENT 1.7 MG/0.75ML ~~LOC~~ SOAJ: 1.7000 mg | SUBCUTANEOUS | 0 refills | Status: DC

## 2024-02-27 NOTE — Progress Notes (Signed)
 Wegovy

## 2024-03-02 ENCOUNTER — Inpatient Hospital Stay (HOSPITAL_BASED_OUTPATIENT_CLINIC_OR_DEPARTMENT_OTHER): Admitting: Oncology

## 2024-03-02 ENCOUNTER — Inpatient Hospital Stay

## 2024-03-02 ENCOUNTER — Encounter: Payer: Self-pay | Admitting: Oncology

## 2024-03-02 VITALS — BP 117/77 | HR 63 | Resp 18

## 2024-03-02 VITALS — BP 136/75 | HR 62 | Temp 98.3°F | Resp 18 | Wt 191.8 lb

## 2024-03-02 DIAGNOSIS — D751 Secondary polycythemia: Secondary | ICD-10-CM

## 2024-03-02 DIAGNOSIS — Z800 Family history of malignant neoplasm of digestive organs: Secondary | ICD-10-CM | POA: Diagnosis not present

## 2024-03-02 DIAGNOSIS — D696 Thrombocytopenia, unspecified: Secondary | ICD-10-CM

## 2024-03-02 LAB — CBC WITH DIFFERENTIAL (CANCER CENTER ONLY)
Abs Immature Granulocytes: 0.02 10*3/uL (ref 0.00–0.07)
Basophils Absolute: 0 10*3/uL (ref 0.0–0.1)
Basophils Relative: 1 %
Eosinophils Absolute: 0.1 10*3/uL (ref 0.0–0.5)
Eosinophils Relative: 2 %
HCT: 50.6 % (ref 39.0–52.0)
Hemoglobin: 17.1 g/dL — ABNORMAL HIGH (ref 13.0–17.0)
Immature Granulocytes: 0 %
Lymphocytes Relative: 42 %
Lymphs Abs: 2.8 10*3/uL (ref 0.7–4.0)
MCH: 31.9 pg (ref 26.0–34.0)
MCHC: 33.8 g/dL (ref 30.0–36.0)
MCV: 94.4 fL (ref 80.0–100.0)
Monocytes Absolute: 0.8 10*3/uL (ref 0.1–1.0)
Monocytes Relative: 11 %
Neutro Abs: 3 10*3/uL (ref 1.7–7.7)
Neutrophils Relative %: 44 %
Platelet Count: 120 10*3/uL — ABNORMAL LOW (ref 150–400)
RBC: 5.36 MIL/uL (ref 4.22–5.81)
RDW: 14.6 % (ref 11.5–15.5)
WBC Count: 6.7 10*3/uL (ref 4.0–10.5)
nRBC: 0 % (ref 0.0–0.2)

## 2024-03-02 LAB — IMMATURE PLATELET FRACTION: Immature Platelet Fraction: 6 % (ref 1.2–8.6)

## 2024-03-02 LAB — VITAMIN B12: Vitamin B-12: 885 pg/mL (ref 180–914)

## 2024-03-02 MED ORDER — SODIUM CHLORIDE 0.9 % IV SOLN
Freq: Once | INTRAVENOUS | Status: DC
  Filled 2024-03-02: qty 250

## 2024-03-02 NOTE — Assessment & Plan Note (Signed)
 Thrombocytopenia, likely ITP Previous work-up showed normal folate and vitamin B12 level, negative flowcytometry and monoclonal gammopathy workup. Hepatitis B surface antigen tested negative in 2019.  Hep C negative Lab Results  Component Value Date   PLT 120 (L) 03/02/2024   PLT 126 (L) 08/20/2023   PLT 141 (L) 02/18/2023   Continue monitor.

## 2024-03-02 NOTE — Progress Notes (Signed)
 Patient tolerated phlebotomy well used 20 gauge IV. Removed 500 mls as ordered. No concerns and stable at discharge. AVS reviewed and pt refused a copy.

## 2024-03-02 NOTE — Assessment & Plan Note (Signed)
Secondary erythrocytosis,due to testosterone replacement therapy. Hematocrit is above 50.  Recommend therapeutic phlebotomy 500 cc x 1 today.

## 2024-03-02 NOTE — Patient Instructions (Signed)

## 2024-03-02 NOTE — Progress Notes (Signed)
 Hematology/Oncology Progress note [phone] [phone]     CHIEF COMPLAINTS/REASON FOR VISIT:  Follow-up for thrombocytopenia and erythrocytosis   ASSESSMENT & PLAN:   Secondary erythrocytosis Secondary erythrocytosis,due to testosterone  replacement therapy. Hematocrit is above 50.  Recommend therapeutic phlebotomy 500 cc x 1 today.  Thrombocytopenia (HCC) Thrombocytopenia, likely ITP Previous work-up showed normal folate and vitamin B12 level, negative flowcytometry and monoclonal gammopathy workup. Hepatitis B surface antigen tested negative in 2019.  Hep C negative Lab Results  Component Value Date   PLT 120 (L) 03/02/2024   PLT 126 (L) 08/20/2023   PLT 141 (L) 02/18/2023   Continue monitor.  Orders Placed This Encounter  Procedures   CBC with Differential (Cancer Center Only)    Standing Status:   Future    Expected Date:   09/01/2024    Expiration Date:   03/02/2025   Vitamin B12    Standing Status:   Future    Expected Date:   09/01/2024    Expiration Date:   03/02/2025   Follow up in 6 months All questions were answered. The patient knows to call the clinic with any problems, questions or concerns.  Timmy Forbes, MD, PhD [facility] Hematology Oncology 03/02/2024     HISTORY OF PRESENTING ILLNESS:  Christian Cole is a 73 y.o. male who was seen in consultation at the request of Valli Gaw, MD for evaluation of thrombocytopenia and erythrocytosis.    Reviewed patient's labs done previously.  12/21/2020 labs showed decreased platelet counts at 1 39,000. wbc 8.4 hemoglobin 18.3. Reviewed patient's previous labs. Thrombocytopenia is chronic chronic onset , since at least 2015. No aggravating or elevated factors. Erythrocytosis, started after patient was started on testosterone  replacement therapy. He previously never had erythrocytosis sent to them. Associated symptoms or signs:  Denies weight loss, fever, chills, fatigue, night sweats.   Denies hematochezia, hematuria, hematemesis, epistaxis, black tarry stool.  easy bruising.   Patient drinks 3 beers a day.  Sleep apnea, patient reports being compliant with CPAP machine 07/19/2022, ultrasound kidney showed hypoechoic kidney mass.  Patient follows with urologist and was recommended to observation and follow-up with additional imaging in the future.  INTERVAL HISTORY Christian Cole is a 73 y.o. male who has above history reviewed by me today presents for follow up visit for management of erythrocytosis and thrombocytopenia. Patient has no new complaints.  He is on testosterone  replacement therapy.  Review of Systems  Constitutional:  Negative for appetite change, chills, fatigue, fever and unexpected weight change.  HENT:   Negative for hearing loss and voice change.   Eyes:  Negative for eye problems and icterus.  Respiratory:  Negative for chest tightness, cough and shortness of breath.   Cardiovascular:  Negative for chest pain and leg swelling.  Gastrointestinal:  Negative for abdominal distention and abdominal pain.  Endocrine: Negative for hot flashes.  Genitourinary:  Negative for difficulty urinating, dysuria and frequency.   Musculoskeletal:  Negative for arthralgias.  Skin:  Negative for itching and rash.  Neurological:  Negative for light-headedness and numbness.  Hematological:  Negative for adenopathy. Does not bruise/bleed easily.  Psychiatric/Behavioral:  Negative for confusion.     MEDICAL HISTORY:  Past Medical History:  Diagnosis Date   Aortic atherosclerosis (HCC)    BPH (benign prostatic hyperplasia)    CAD (coronary artery disease)    a. s/p CABG in 2000 with LIMA-LAD, RIMA-PDA, SVG-D1, and SVG-PLA b. low-risk NST in 2016   Carotid artery stenosis  Cyst of right kidney    ED (erectile dysfunction)    a.) penile implant in place   Fatty liver    GERD (gastroesophageal reflux disease)    Hyperlipidemia    Hypertension    Long term  current use of antithrombotics/antiplatelets    a.) clopidogrel    Low testosterone     a.) on exogenous TRT   OSA on CPAP    Pre-diabetes    S/P CABG x 4 2000   a.) LIMA-LAD, RIMA-PDA, SVG-D1, SVG-PLA   Secondary erythrocytosis    a.) smoking + TRT + OSAH   Sinus bradycardia    Skin cancer    Thrombocytopenia (HCC)    Tongue lesion    benign from trauma as of 06/20/21    SURGICAL HISTORY: Past Surgical History:  Procedure Laterality Date   CATARACT EXTRACTION, BILATERAL  2010   COLONOSCOPY WITH PROPOFOL  N/A 04/15/2020   Procedure: COLONOSCOPY WITH PROPOFOL ;  Surgeon: Marshall Skeeter, MD;  Location: [facility] ENDOSCOPY;  Service: Endoscopy;  Laterality: N/A;   CORONARY ARTERY BYPASS GRAFT  2000   LIMA to the LAD,RIMA to the PDA,vein graft to the diagonal & vein graft to the PLA   ESOPHAGOGASTRODUODENOSCOPY     Dr. Peg Bouton   ESOPHAGOGASTRODUODENOSCOPY  04/15/2020   Procedure: ESOPHAGOGASTRODUODENOSCOPY (EGD);  Surgeon: Marshall Skeeter, MD;  Location: [facility] ENDOSCOPY;  Service: Endoscopy;;   HOLEP-LASER ENUCLEATION OF THE PROSTATE WITH MORCELLATION N/A 11/27/2021   Procedure: HOLEP-LASER ENUCLEATION OF THE PROSTATE WITH MORCELLATION;  Surgeon: Dustin Gimenez, MD;  Location: [facility] ORS;  Service: Urology;  Laterality: N/A;   INGUINAL HERNIA REPAIR Left 01/11/2022   Procedure: HERNIA REPAIR INGUINAL ADULT;  Surgeon: Emmalene Hare, MD;  Location: [facility] ORS;  Service: General;  Laterality: Left;   KNEE ARTHROSCOPY Right 1991   KNEE DEBRIDEMENT Right 1989   Fluid flush   PENILE PROSTHESIS IMPLANT     06/26/19 Dr. Rose Conception   SHOULDER ARTHROSCOPY Left 2011   UMBILICAL HERNIA REPAIR N/A 01/11/2022   Procedure: HERNIA REPAIR UMBILICAL ADULT;  Surgeon: Emmalene Hare, MD;  Location: [facility] ORS;  Service: General;  Laterality: N/A;   US  ECHOCARDIOGRAPHY  12/12/2010   mild LA dilatation, normal LV systolic fx, EF > 55%   XI ROBOTIC ASSISTED INGUINAL HERNIA REPAIR WITH MESH Right 01/11/2022   Procedure:  XI ROBOTIC ASSISTED INGUINAL HERNIA REPAIR WITH MESH;  Surgeon: Emmalene Hare, MD;  Location: [facility] ORS;  Service: General;  Laterality: Right;    SOCIAL HISTORY: Social History   Socioeconomic History   Marital status: Married    Spouse name: Not on file   Number of children: 3   Years of education: 16   Highest education level: Bachelor's degree (e.g., BA, AB, BS)  Occupational History   Occupation: Retired Electronics engineer   Occupation: Management/Technical Support    Comment: Educational psychologist  Tobacco Use   Smoking status: Every Day    Current packs/day: 0.50    Average packs/day: 0.5 packs/day for 44.0 years (22.0 ttl pk-yrs)    Types: Cigarettes   Smokeless tobacco: Never   Tobacco comments:    on and off smoker   Vaping Use   Vaping status: Never Used  Substance and Sexual Activity   Alcohol use: Yes    Alcohol/week: 10.0 standard drinks of alcohol    Types: 10 Cans of beer per week    Comment: Down to 3 beers a day from 4-6 a day    Drug use: No   Sexual activity: Yes  Partners: Female    Comment: Wife  Other Topics Concern   Not on file  Social History Narrative   Tyray grew up in [place] . He is currently living in [place] with his wife. This is his second marriage. He has 1 daughter and 2 sons from his first marriage. He has 4 step kids. His daughter lives in [place] , one son in [place], [place] and the other son in [place], [place]. 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       He worked Designer, industrial/product for a [employer]    He enjoys wood working on his spare time. 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 He also enjoys shooting sports - target shooting and [employer] Herbalist).       He retired 04/2019 and enjoys this       2 brothers and 2 sisters he is next to youngest    Social Drivers of Corporate investment banker Strain: Low Risk  (09/26/2023)   Overall Financial Resource Strain (CARDIA)    Difficulty of Paying Living  Expenses: Not hard at all  Food Insecurity: No Food Insecurity (10/17/2023)   Hunger Vital Sign    Worried About Running Out of Food in the Last Year: Never true    Ran Out of Food in the Last Year: Never true  Transportation Needs: No Transportation Needs (10/17/2023)   PRAPARE - Administrator, Civil Service (Medical): No    Lack of Transportation (Non-Medical): No  Physical Activity: Insufficiently Active (09/26/2023)   Exercise Vital Sign    Days of Exercise per Week: 6 days    Minutes of Exercise per Session: 20 min  Stress: No Stress Concern Present (09/26/2023)   [employer] of Occupational Health - Occupational Stress Questionnaire    Feeling of Stress : Not at all  Social Connections: Moderately Isolated (09/26/2023)   Social Connection and Isolation Panel [NHANES]    Frequency of Communication with Friends and Family: More than three times a week    Frequency of Social Gatherings with Friends and Family: Once a week    Attends Religious Services: Never    Database administrator or Organizations: No    Attends Banker Meetings: Never    Marital Status: Married  Catering manager Violence: Not At Risk (10/17/2023)   Humiliation, Afraid, Rape, and Kick questionnaire    Fear of Current or Ex-Partner: No    Emotionally Abused: No    Physically Abused: No    Sexually Abused: No    FAMILY HISTORY: Family History  Problem Relation Age of Onset   Arthritis Mother    Heart disease Father        CAD - MI   Stroke Father    Hypertension Father    Cancer Father 46       esophageal cancer    Arthritis Maternal Grandmother     ALLERGIES:  has no known allergies.  MEDICATIONS:  Current Outpatient Medications  Medication Sig Dispense Refill   amLODipine  (NORVASC ) 10 MG tablet Take 1 tablet (10 mg total) by mouth every evening. 90 tablet 3   atorvastatin  (LIPITOR) 40 MG tablet TAKE 1 TABLET(40 MG) BY MOUTH DAILY AT NIGHT 90 tablet 3   B-D 3CC  LUER-LOK SYR 23GX1-1/2 23G X 1-1/2" 3 ML MISC Use once every 14 days 50 each 5   benazepril  (LOTENSIN ) 40 MG tablet Take 1 tablet (40 mg total) by mouth  every evening.     clopidogrel  (PLAVIX ) 75 MG tablet Take 1 tablet (75 mg total) by mouth every evening.     ezetimibe  (ZETIA ) 10 MG tablet TAKE 1 TABLET(10 MG) BY MOUTH DAILY 90 tablet 3   ibuprofen (ADVIL) 200 MG tablet Take 400 mg by mouth every 8 (eight) hours as needed (pain.).     Multiple Vitamins-Minerals (MULTIVITAMIN WITH MINERALS) tablet Take 1 tablet by mouth every evening. Men 50+     NEEDLE, DISP, 18 G (B-D HYPODERMIC NEEDLE 18GX1.5") 18G X 1-1/2" MISC DRAW UP MEDICATION WITH THIS NEEDLE AS DIRECTED 30 each 0   pantoprazole  (PROTONIX ) 40 MG tablet Take 1 tablet (40 mg total) by mouth every evening. TAKE 1 TABLET BY MOUTH EVERY DAY 30 MINUTES BEFORE A MEAL 90 tablet 3   [START ON 05/24/2024] Semaglutide -Weight Management 1.7 MG/0.75ML SOAJ Inject 1.7 mg into the skin once a week for 28 days. 3 mL 0   testosterone  cypionate (DEPOTESTOSTERONE CYPIONATE) 200 MG/ML injection ADMINISTER 1.5 ML(300 MG) IN THE MUSCLE EVERY 14 DAYS 10 mL 0   No current facility-administered medications for this visit.     PHYSICAL EXAMINATION: ECOG PERFORMANCE STATUS: 0 - Asymptomatic Vitals:   03/02/24 1414  BP: 136/75  Pulse: 62  Resp: 18  Temp: 98.3 F (36.8 C)  SpO2: 98%   Filed Weights   03/02/24 1414  Weight: 191 lb 12.8 oz (87 kg)    Physical Exam Constitutional:      General: He is not in acute distress. HENT:     Head: Normocephalic and atraumatic.  Eyes:     General: No scleral icterus. Cardiovascular:     Rate and Rhythm: Normal rate and regular rhythm.     Heart sounds: Normal heart sounds.  Pulmonary:     Effort: Pulmonary effort is normal. No respiratory distress.     Breath sounds: No wheezing.  Abdominal:     General: Bowel sounds are normal. There is no distension.     Palpations: Abdomen is soft.  Musculoskeletal:         General: No deformity. Normal range of motion.     Cervical back: Normal range of motion and neck supple.  Skin:    General: Skin is warm and dry.     Findings: No erythema or rash.  Neurological:     Mental Status: He is alert and oriented to person, place, and time. Mental status is at baseline.     Cranial Nerves: No cranial nerve deficit.     Coordination: Coordination normal.  Psychiatric:        Mood and Affect: Mood normal.      LABORATORY DATA:  I have reviewed the data as listed Lab Results  Component Value Date   WBC 6.7 03/02/2024   HGB 17.1 (H) 03/02/2024   HCT 50.6 03/02/2024   MCV 94.4 03/02/2024   PLT 120 (L) 03/02/2024   Recent Labs    03/21/23 1009 09/30/23 1014  NA 139 140  K 4.4 4.5  CL 104 104  CO2 25 27  GLUCOSE 101* 104*  BUN 15 15  CREATININE 1.16 1.13  CALCIUM  9.0 9.4  PROT 6.5 6.8  ALBUMIN 4.2 4.5  AST 21 22  ALT 18 20  ALKPHOS 67 80  BILITOT 0.8 0.8   Iron/TIBC/Ferritin/ %Sat No results found for: "IRON", "TIBC", "FERRITIN", "IRONPCTSAT"    RADIOGRAPHIC STUDIES: I have personally reviewed the radiological images as listed and agreed with the findings in the  report.  No results found.

## 2024-03-16 ENCOUNTER — Ambulatory Visit
Admission: RE | Admit: 2024-03-16 | Discharge: 2024-03-16 | Disposition: A | Discharge status: Home / Self Care | Source: Ambulatory Visit | Attending: Acute Care | Admitting: Acute Care

## 2024-03-16 DIAGNOSIS — Z122 Encounter for screening for malignant neoplasm of respiratory organs: Secondary | ICD-10-CM | POA: Insufficient documentation

## 2024-03-16 DIAGNOSIS — F17210 Nicotine dependence, cigarettes, uncomplicated: Secondary | ICD-10-CM | POA: Insufficient documentation

## 2024-03-16 DIAGNOSIS — Z87891 Personal history of nicotine dependence: Secondary | ICD-10-CM | POA: Diagnosis not present

## 2024-03-30 ENCOUNTER — Encounter: Payer: Self-pay | Admitting: Family Medicine

## 2024-03-30 ENCOUNTER — Ambulatory Visit (INDEPENDENT_AMBULATORY_CARE_PROVIDER_SITE_OTHER): Payer: Medicare Other | Admitting: Family Medicine

## 2024-03-30 VITALS — BP 132/72 | HR 60 | Temp 98.0°F | Resp 20 | Ht 71.0 in | Wt 184.4 lb

## 2024-03-30 DIAGNOSIS — I77810 Thoracic aortic ectasia: Secondary | ICD-10-CM | POA: Diagnosis not present

## 2024-03-30 DIAGNOSIS — I2510 Atherosclerotic heart disease of native coronary artery without angina pectoris: Secondary | ICD-10-CM | POA: Diagnosis not present

## 2024-03-30 DIAGNOSIS — K2100 Gastro-esophageal reflux disease with esophagitis, without bleeding: Secondary | ICD-10-CM

## 2024-03-30 DIAGNOSIS — I10 Essential (primary) hypertension: Secondary | ICD-10-CM

## 2024-03-30 DIAGNOSIS — E559 Vitamin D deficiency, unspecified: Secondary | ICD-10-CM

## 2024-03-30 DIAGNOSIS — I700 Atherosclerosis of aorta: Secondary | ICD-10-CM

## 2024-03-30 DIAGNOSIS — G4733 Obstructive sleep apnea (adult) (pediatric): Secondary | ICD-10-CM | POA: Diagnosis not present

## 2024-03-30 DIAGNOSIS — E669 Obesity, unspecified: Secondary | ICD-10-CM | POA: Diagnosis not present

## 2024-03-30 DIAGNOSIS — E538 Deficiency of other specified B group vitamins: Secondary | ICD-10-CM

## 2024-03-30 DIAGNOSIS — M160 Bilateral primary osteoarthritis of hip: Secondary | ICD-10-CM

## 2024-03-30 DIAGNOSIS — E785 Hyperlipidemia, unspecified: Secondary | ICD-10-CM

## 2024-03-30 DIAGNOSIS — K219 Gastro-esophageal reflux disease without esophagitis: Secondary | ICD-10-CM

## 2024-03-30 LAB — COMPREHENSIVE METABOLIC PANEL WITH GFR
ALT: 25 U/L (ref 0–53)
AST: 24 U/L (ref 0–37)
Albumin: 4.7 g/dL (ref 3.5–5.2)
Alkaline Phosphatase: 76 U/L (ref 39–117)
BUN: 15 mg/dL (ref 6–23)
CO2: 26 meq/L (ref 19–32)
Calcium: 9.7 mg/dL (ref 8.4–10.5)
Chloride: 101 meq/L (ref 96–112)
Creatinine, Ser: 1.12 mg/dL (ref 0.40–1.50)
GFR: 64.88 mL/min (ref >60.00)
Glucose, Bld: 92 mg/dL (ref 70–99)
Potassium: 4.2 meq/L (ref 3.5–5.1)
Sodium: 136 meq/L (ref 135–145)
Total Bilirubin: 0.9 mg/dL (ref 0.2–1.2)
Total Protein: 7.2 g/dL (ref 6.0–8.3)

## 2024-03-30 LAB — LIPID PANEL
Cholesterol: 121 mg/dL (ref 0–200)
HDL: 59 mg/dL (ref >39.00)
LDL Cholesterol: 49 mg/dL (ref 0–99)
NonHDL: 62.11
Total CHOL/HDL Ratio: 2
Triglycerides: 64 mg/dL (ref 0.0–149.0)
VLDL: 12.8 mg/dL (ref 0.0–40.0)

## 2024-03-30 LAB — CBC WITH DIFFERENTIAL/PLATELET
Basophils Absolute: 0 10*3/uL (ref 0.0–0.1)
Basophils Relative: 0.3 % (ref 0.0–3.0)
Eosinophils Absolute: 0.1 10*3/uL (ref 0.0–0.7)
Eosinophils Relative: 1.2 % (ref 0.0–5.0)
HCT: 50.7 % (ref 39.0–52.0)
Hemoglobin: 17.2 g/dL — ABNORMAL HIGH (ref 13.0–17.0)
Lymphocytes Relative: 40.3 % (ref 12.0–46.0)
Lymphs Abs: 2.8 10*3/uL (ref 0.7–4.0)
MCHC: 33.9 g/dL (ref 30.0–36.0)
MCV: 95.4 fl (ref 78.0–100.0)
Monocytes Absolute: 0.8 10*3/uL (ref 0.1–1.0)
Monocytes Relative: 11 % (ref 3.0–12.0)
Neutro Abs: 3.3 10*3/uL (ref 1.4–7.7)
Neutrophils Relative %: 47.2 % (ref 43.0–77.0)
Platelets: 127 10*3/uL — ABNORMAL LOW (ref 150.0–400.0)
RBC: 5.31 Mil/uL (ref 4.22–5.81)
RDW: 15.5 % (ref 11.5–15.5)
WBC: 7 10*3/uL (ref 4.0–10.5)

## 2024-03-30 MED ORDER — BENAZEPRIL HCL 40 MG PO TABS
40.0000 mg | ORAL_TABLET | Freq: Every evening | ORAL | 3 refills | Status: DC
Start: 2024-03-30 — End: 2024-06-01

## 2024-03-30 MED ORDER — AMLODIPINE BESYLATE 10 MG PO TABS: 10.0000 mg | ORAL_TABLET | Freq: Every evening | ORAL | 3 refills | Status: AC

## 2024-03-30 MED ORDER — EZETIMIBE 10 MG PO TABS: ORAL_TABLET | ORAL | 3 refills | Status: AC

## 2024-03-30 MED ORDER — SEMAGLUTIDE-WEIGHT MANAGEMENT 2.4 MG/0.75ML ~~LOC~~ SOAJ: 2.4000 mg | SUBCUTANEOUS | 0 refills | Status: DC

## 2024-03-30 MED ORDER — ATORVASTATIN CALCIUM 40 MG PO TABS: ORAL_TABLET | ORAL | 3 refills | Status: AC

## 2024-03-30 MED ORDER — PANTOPRAZOLE SODIUM 40 MG PO TBEC: 40.0000 mg | DELAYED_RELEASE_TABLET | Freq: Every evening | ORAL | 3 refills | Status: AC

## 2024-03-30 MED ORDER — CLOPIDOGREL BISULFATE 75 MG PO TABS: 75.0000 mg | ORAL_TABLET | Freq: Every evening | ORAL | 3 refills | Status: AC

## 2024-03-30 NOTE — Patient Instructions (Signed)
 It was a pleasure meeting you today. Thank you for allowing me to take part in your health care.  Our goals for today as we discussed include:  Increase Wegovy  to 2.4 mg weekly Notify MD in couple weeks.  If tolerating will send in refills  We will get some labs today.  If they are abnormal or we need to do something about them, I will call you.  If they are normal, I will send you a message on MyChart (if it is active) or a letter in the mail.  If you don't hear from us  in 2 weeks, please call the office at the number below.   Refills sent for requested medications    This is a list of the screening recommended for you and due dates:  Health Maintenance  Topic Date Due   Medicare Annual Wellness Visit  Never done   Zoster (Shingles) Vaccine (2 of 2) 06/28/2016   Screening for Lung Cancer  03/14/2024   DTaP/Tdap/Td vaccine (1 - Tdap) 10/08/2024*   Flu Shot  06/12/2024   Colon Cancer Screening  04/15/2025   Pneumonia Vaccine  Completed   Hepatitis C Screening  Completed   HPV Vaccine  Aged Out   Meningitis B Vaccine  Aged Out   COVID-19 Vaccine  Discontinued  *Topic was postponed. The date shown is not the original due date.      If you have any questions or concerns, please do not hesitate to call the office at [phone].  I look forward to our next visit and until then take care and stay safe.  Regards,   Valli Gaw, MD   [facility]

## 2024-03-30 NOTE — Progress Notes (Unsigned)
 SUBJECTIVE:   Chief Complaint  Patient presents with   Hypertension    6 month follow up   HPI Follow up chronic disease management  Discussed the use of AI scribe software for clinical note transcription with the patient, who gave verbal consent to proceed.  History of Present Illness Christian Cole is a 73 year old male who presents for a follow-up regarding weight management and medication adjustment.  He has been using Wegovy  for weight management, resulting in significant weight loss from a 40-inch waist to a 34-inch waist. He is currently on a 1.7 mg dose but has noticed a decrease in weight loss from 1.5-2 pounds per week to about 1 pound or less per week. He is currently out of medication.  He experiences waves of nausea and occasional constipation, for which he uses an osmotic laxative. He is conscious of maintaining his protein intake, consuming high-protein foods such as yogurt, cottage cheese, and meat. He notes that 4 ounces of meat is sufficient to fill him up. He has lost 30 pounds, now weighing 184 pounds at a height of 5'11". He is mindful of not losing muscle mass and ensures adequate protein intake to prevent this.  He reports increased hunger in the afternoons and mentions that his wife is concerned about further weight loss. He snacks on cottage cheese, fruit, and occasionally white grapes.  He has experienced some sciatica, which he attributes to his chair. The pain is localized to his calf and resolves with activity or sitting in certain chairs. No numbness or tingling associated with this pain.  No chest pain, shortness of breath, dizziness, or weakness. He is aware that his cardiologist has retired and is awaiting an appointment with a new cardiologist in August.     PERTINENT PMH / PSH: As above  OBJECTIVE:  BP 132/72   Pulse 60   Temp 98 F (36.7 C)   Resp 20   Ht 5\' 11"  (1.803 m)   Wt 184 lb 6 oz (83.6 kg)   SpO2 94%   BMI 25.72 kg/m     Physical Exam Vitals reviewed.  Constitutional:      General: He is not in acute distress.    Appearance: Normal appearance. He is normal weight. He is not ill-appearing, toxic-appearing or diaphoretic.  Eyes:     General:        Right eye: No discharge.        Left eye: No discharge.  Cardiovascular:     Rate and Rhythm: Normal rate and regular rhythm.     Heart sounds: Normal heart sounds.  Pulmonary:     Effort: Pulmonary effort is normal.     Breath sounds: Normal breath sounds.  Abdominal:     General: Bowel sounds are normal.  Musculoskeletal:        General: Normal range of motion.     Cervical back: Normal range of motion.  Skin:    General: Skin is warm and dry.  Neurological:     Mental Status: He is alert and oriented to person, place, and time. Mental status is at baseline.  Psychiatric:        Mood and Affect: Mood normal.        Behavior: Behavior normal.        Thought Content: Thought content normal.        Judgment: Judgment normal.           03/30/2024   11:29 AM 12/04/2023  8:40 AM 09/30/2023    9:41 AM 04/23/2023    8:16 AM 02/20/2023    1:03 PM  Depression screen PHQ 2/9  Decreased Interest 0 0 0 0 0  Down, Depressed, Hopeless 0 0 0 0 0  PHQ - 2 Score 0 0 0 0 0  Altered sleeping 0 0 0 0   Tired, decreased energy 0 0 0 0   Change in appetite 0 0 0 0   Feeling bad or failure about yourself  0 0 0 0   Trouble concentrating 0 0 0 0   Moving slowly or fidgety/restless 0 0 0 0   Suicidal thoughts 0 0 0 0   PHQ-9 Score 0 0 0 0   Difficult doing work/chores Not difficult at all Not difficult at all Not difficult at all Not difficult at all       03/30/2024   11:29 AM 12/04/2023    8:40 AM 09/30/2023    9:41 AM 04/23/2023    8:16 AM  GAD 7 : Generalized Anxiety Score  Nervous, Anxious, on Edge 0 0 0 0  Control/stop worrying 0 0 0 0  Worry too much - different things 0 0 0 0  Trouble relaxing 0 0 0 0  Restless 0 0 0 0  Easily annoyed or  irritable 0 0 0 0  Afraid - awful might happen 0 0 0 0  Total GAD 7 Score 0 0 0 0  Anxiety Difficulty Not difficult at all Not difficult at all Not difficult at all Not difficult at all    ASSESSMENT/PLAN:  Obesity (BMI 30-39.9) Assessment & Plan: 09/2023 Starting weight 214 lbs Today's weight 184 lbs Total loss 30 lbs - Increase Wegovy  to  mg weekly. - Continue healthy diet and activity - Recommend resistance training   Orders: -     Semaglutide -Weight Management; Inject 2.4 mg into the skin once a week for 28 days.  Dispense: 3 mL; Refill: 0  Hyperlipidemia, unspecified hyperlipidemia type Assessment & Plan: Refill Zetia  10 mg daily Refill Atorvastatin  40 mg daily Check Lipids   Orders: -     Atorvastatin  Calcium ; TAKE 1 TABLET(40 MG) BY MOUTH DAILY AT NIGHT  Dispense: 90 tablet; Refill: 3 -     Ezetimibe ; TAKE 1 TABLET(10 MG) BY MOUTH DAILY  Dispense: 90 tablet; Refill: 3 -     Lipid panel  Primary hypertension Assessment & Plan: Well-controlled with current medication regimen. . - Refill Amlodipine  10 mg at night - Refill Benzapril 40 mg at night - Check Cmet     Orders: -     amLODIPine  Besylate; Take 1 tablet (10 mg total) by mouth every evening.  Dispense: 90 tablet; Refill: 3 -     Benazepril  HCl; Take 1 tablet (40 mg total) by mouth every evening.  Dispense: 90 tablet; Refill: 3 -     Semaglutide -Weight Management; Inject 2.4 mg into the skin once a week for 28 days.  Dispense: 3 mL; Refill: 0 -     Comprehensive metabolic panel with GFR  Coronary artery disease involving native coronary artery of native heart without angina pectoris Assessment & Plan: No signs of bleeding or clotting issues. Reports taking for preventative use. Hx CABG, no h/x stents or ASA allergy.  Tolerating well. -Refill Plavix  75 mg daily  -Check CBC  Orders: -     Clopidogrel  Bisulfate; Take 1 tablet (75 mg total) by mouth every evening.  Dispense: 90 tablet; Refill: 3 -  CBC  with Differential/Platelet  Gastroesophageal reflux disease with esophagitis without hemorrhage Assessment & Plan: Chronic. Gastritis, reflux esophagitis and chronic duodenitis noted on EGD, 06/21. Refill Protonix  40 mg daily Limit use of NSAIDs.  Orders: -     Pantoprazole  Sodium; Take 1 tablet (40 mg total) by mouth every evening. 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   Maintain BP control Follows with cardiology.    Aortic atherosclerosis (HCC) Assessment & Plan: Chronic.  Noted on CT chest 03/15/2023.  On statin therapy and Zetia .   Osteoarthritis of both hips, unspecified osteoarthritis type Assessment & Plan: Intermittent symptoms likely due to inadequate chair support post-weight loss. No numbness or tingling.  Previous imaging shows arthritis in bilateral hips.   - Recommend replacing or adding support to current chair. - Advise on stretching exercises for piriformis and hamstrings. - Consider physical therapy if symptoms persist or worsen. - If no improvement can reimage    PDMP reviewed  Return if symptoms worsen or fail to improve, for PCP.  Valli Gaw, MD

## 2024-03-31 LAB — VITAMIN D 25 HYDROXY (VIT D DEFICIENCY, FRACTURES): VITD: 39.45 ng/mL (ref 30.00–100.00)

## 2024-03-31 LAB — VITAMIN B12: Vitamin B-12: 835 pg/mL (ref 211–911)

## 2024-04-01 ENCOUNTER — Ambulatory Visit: Payer: Self-pay | Admitting: Family Medicine

## 2024-04-04 DIAGNOSIS — Z7902 Long term (current) use of antithrombotics/antiplatelets: Secondary | ICD-10-CM | POA: Diagnosis not present

## 2024-04-04 DIAGNOSIS — R112 Nausea with vomiting, unspecified: Secondary | ICD-10-CM | POA: Diagnosis not present

## 2024-04-04 DIAGNOSIS — K148 Other diseases of tongue: Secondary | ICD-10-CM | POA: Diagnosis not present

## 2024-04-04 DIAGNOSIS — R220 Localized swelling, mass and lump, head: Secondary | ICD-10-CM | POA: Diagnosis not present

## 2024-04-04 DIAGNOSIS — I10 Essential (primary) hypertension: Secondary | ICD-10-CM | POA: Diagnosis not present

## 2024-04-04 DIAGNOSIS — Z79899 Other long term (current) drug therapy: Secondary | ICD-10-CM | POA: Diagnosis not present

## 2024-04-05 ENCOUNTER — Encounter: Payer: Self-pay | Admitting: Family Medicine

## 2024-04-05 DIAGNOSIS — M160 Bilateral primary osteoarthritis of hip: Secondary | ICD-10-CM | POA: Insufficient documentation

## 2024-04-05 DIAGNOSIS — E538 Deficiency of other specified B group vitamins: Secondary | ICD-10-CM | POA: Insufficient documentation

## 2024-04-05 NOTE — Assessment & Plan Note (Signed)
 Well-controlled with current medication regimen. . - Refill Amlodipine  10 mg at night - Refill Benzapril 40 mg at night - Check Cmet

## 2024-04-05 NOTE — Assessment & Plan Note (Signed)
Chronic.  Noted on CT chest 03/15/2023.  On statin therapy and Zetia.

## 2024-04-05 NOTE — Assessment & Plan Note (Signed)
 Intermittent symptoms likely due to inadequate chair support post-weight loss. No numbness or tingling.  Previous imaging shows arthritis in bilateral hips.   - Recommend replacing or adding support to current chair. - Advise on stretching exercises for piriformis and hamstrings. - Consider physical therapy if symptoms persist or worsen. - If no improvement can reimage

## 2024-04-05 NOTE — Assessment & Plan Note (Deleted)
 09/2023 Starting weight 214 lbs Today's weight 184 lbs Total loss 30 lbs - Increase Wegovy  to  mg weekly. - Continue healthy diet and activity - Recommend resistance training

## 2024-04-05 NOTE — Assessment & Plan Note (Signed)
 Noted on CT chest lung 03/15/2023.  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   Maintain BP control Follows with cardiology.

## 2024-04-05 NOTE — Assessment & Plan Note (Addendum)
 No signs of bleeding or clotting issues. Reports taking for preventative use. Hx CABG, no h/x stents or ASA allergy.  Tolerating well. -Refill Plavix  75 mg daily  -Check CBC

## 2024-04-05 NOTE — Assessment & Plan Note (Signed)
 09/2023 Starting weight 214 lbs Today's weight 184 lbs Total loss 30 lbs - Increase Wegovy  to  mg weekly. - Continue healthy diet and activity - Recommend resistance training

## 2024-04-05 NOTE — Assessment & Plan Note (Signed)
 Chronic. Gastritis, reflux esophagitis and chronic duodenitis noted on EGD, 06/21. Refill Protonix  40 mg daily Limit use of NSAIDs.

## 2024-04-05 NOTE — Assessment & Plan Note (Signed)
 Refill Zetia  10 mg daily Refill Atorvastatin  40 mg daily Check Lipids

## 2024-04-05 NOTE — Assessment & Plan Note (Signed)
 Compliant with CPAP Currently on GLP1 with improvement in weight management Could consider switching to Zepbound in future for continued coverage given FDA approved for OSA

## 2024-04-08 ENCOUNTER — Encounter: Payer: Self-pay | Admitting: Cardiovascular Disease

## 2024-04-08 DIAGNOSIS — L905 Scar conditions and fibrosis of skin: Secondary | ICD-10-CM | POA: Diagnosis not present

## 2024-04-08 DIAGNOSIS — C44622 Squamous cell carcinoma of skin of right upper limb, including shoulder: Secondary | ICD-10-CM | POA: Diagnosis not present

## 2024-04-10 ENCOUNTER — Telehealth: Payer: Self-pay | Admitting: Cardiovascular Disease

## 2024-04-10 ENCOUNTER — Other Ambulatory Visit: Payer: Self-pay

## 2024-04-10 DIAGNOSIS — F17210 Nicotine dependence, cigarettes, uncomplicated: Secondary | ICD-10-CM

## 2024-04-10 DIAGNOSIS — R972 Elevated prostate specific antigen [PSA]: Secondary | ICD-10-CM

## 2024-04-10 DIAGNOSIS — Z87891 Personal history of nicotine dependence: Secondary | ICD-10-CM

## 2024-04-10 DIAGNOSIS — Z122 Encounter for screening for malignant neoplasm of respiratory organs: Secondary | ICD-10-CM

## 2024-04-10 DIAGNOSIS — E291 Testicular hypofunction: Secondary | ICD-10-CM

## 2024-04-10 NOTE — Telephone Encounter (Signed)
 Spoke with patient. Duplicate message.   Patient sent mychart message and it has been sent to provider. See encounter from 5/28

## 2024-04-10 NOTE — Telephone Encounter (Signed)
  Pt c/o medication issue:  1. Name of Medication: benazepril  (LOTENSIN ) 40 MG tablet   2. How are you currently taking this medication (dosage and times per day)?   Take 1 tablet (40 mg total) by mouth every evening.    3. Are you having a reaction (difficulty breathing--STAT)? No   4. What is your medication issue? Pt was recently in [place] and was hospitalized because his tongue swells. The doctor said, there it might have been this medication and advised him to stop taking it. He is calling to check if Dr. Loetta Ringer will change this prescription

## 2024-04-13 ENCOUNTER — Other Ambulatory Visit

## 2024-04-13 DIAGNOSIS — R972 Elevated prostate specific antigen [PSA]: Secondary | ICD-10-CM | POA: Diagnosis not present

## 2024-04-14 LAB — PSA: Prostate Specific Ag, Serum: 1.4 ng/mL (ref 0.0–4.0)

## 2024-04-14 LAB — TESTOSTERONE: Testosterone: 625 ng/dL (ref 264–916)

## 2024-04-16 ENCOUNTER — Other Ambulatory Visit: Payer: Self-pay

## 2024-04-17 ENCOUNTER — Ambulatory Visit (INDEPENDENT_AMBULATORY_CARE_PROVIDER_SITE_OTHER): Payer: Self-pay | Admitting: Urology

## 2024-04-17 ENCOUNTER — Encounter: Payer: Self-pay | Admitting: Urology

## 2024-04-17 VITALS — BP 146/78 | HR 68 | Ht 71.0 in | Wt 179.0 lb

## 2024-04-17 DIAGNOSIS — Z125 Encounter for screening for malignant neoplasm of prostate: Secondary | ICD-10-CM

## 2024-04-17 DIAGNOSIS — E291 Testicular hypofunction: Secondary | ICD-10-CM | POA: Diagnosis not present

## 2024-04-17 MED ORDER — "BD HYPODERMIC NEEDLE 18G X 1-1/2"" MISC": 0 refills | Status: DC

## 2024-04-17 NOTE — Progress Notes (Signed)
 04/17/2024 9:25 AM   Francina Irish 1951-10-07 [id number]  Referring provider: Valli Gaw, MD [address] [place],  [place] [address]  Chief Complaint  Patient presents with   Hypogonadism    Urologic history: 1.  Hypogonadism TRT testosterone  cypionate   2.  Erectile dysfunction Penile prosthesis; [facility] 06/2019  3.  BPH with LUTS HoLEP Dr. Ace Holder 11/17/2021 Benign pathology  4.  Indeterminant right renal lesion 1.5 cm exophytic lesion right kidney MRI 01/2022 felt consistent with hemorrhagic cyst however had significant motion artifact causing questionable mural enhancement CT 2024 showed resolution of the indeterminate mass consistent with a ruptured hemorrhagic cyst   HPI: 73 y.o. male presents for follow-up visit.  Doing well since last visit No bothersome LUTS Denies dysuria, gross hematuria Denies flank, abdominal or pelvic pain  Labs 04/13/2024: Testosterone  625; PSA 1.4 Seen in hematology last month and underwent phlebotomy   PMH: Past Medical History:  Diagnosis Date   Aortic atherosclerosis (HCC)    BPH (benign prostatic hyperplasia)    CAD (coronary artery disease)    a. s/p CABG in 2000 with LIMA-LAD, RIMA-PDA, SVG-D1, and SVG-PLA b. low-risk NST in 2016   Carotid artery stenosis    Cyst of right kidney    ED (erectile dysfunction)    a.) penile implant in place   Fatty liver    GERD (gastroesophageal reflux disease)    Hyperlipidemia    Hypertension    Long term current use of antithrombotics/antiplatelets    a.) clopidogrel    Low testosterone     a.) on exogenous TRT   OSA on CPAP    Pre-diabetes    S/P CABG x 4 2000   a.) LIMA-LAD, RIMA-PDA, SVG-D1, SVG-PLA   Secondary erythrocytosis    a.) smoking + TRT + OSAH   Sinus bradycardia    Skin cancer    Thrombocytopenia (HCC)    Tongue lesion    benign from trauma as of 06/20/21    Surgical History: Past Surgical History:  Procedure Laterality Date   CATARACT EXTRACTION,  BILATERAL  2010   COLONOSCOPY WITH PROPOFOL  N/A 04/15/2020   Procedure: COLONOSCOPY WITH PROPOFOL ;  Surgeon: Marshall Skeeter, MD;  Location: [facility] ENDOSCOPY;  Service: Endoscopy;  Laterality: N/A;   CORONARY ARTERY BYPASS GRAFT  2000   LIMA to the LAD,RIMA to the PDA,vein graft to the diagonal & vein graft to the PLA   ESOPHAGOGASTRODUODENOSCOPY     Dr. Peg Bouton   ESOPHAGOGASTRODUODENOSCOPY  04/15/2020   Procedure: ESOPHAGOGASTRODUODENOSCOPY (EGD);  Surgeon: Marshall Skeeter, MD;  Location: [facility] ENDOSCOPY;  Service: Endoscopy;;   HOLEP-LASER ENUCLEATION OF THE PROSTATE WITH MORCELLATION N/A 11/27/2021   Procedure: HOLEP-LASER ENUCLEATION OF THE PROSTATE WITH MORCELLATION;  Surgeon: Dustin Gimenez, MD;  Location: [facility] ORS;  Service: Urology;  Laterality: N/A;   INGUINAL HERNIA REPAIR Left 01/11/2022   Procedure: HERNIA REPAIR INGUINAL ADULT;  Surgeon: Emmalene Hare, MD;  Location: [facility] ORS;  Service: General;  Laterality: Left;   KNEE ARTHROSCOPY Right 1991   KNEE DEBRIDEMENT Right 1989   Fluid flush   PENILE PROSTHESIS IMPLANT     06/26/19 Dr. Rose Conception   SHOULDER ARTHROSCOPY Left 2011   UMBILICAL HERNIA REPAIR N/A 01/11/2022   Procedure: HERNIA REPAIR UMBILICAL ADULT;  Surgeon: Emmalene Hare, MD;  Location: [facility] ORS;  Service: General;  Laterality: N/A;   US  ECHOCARDIOGRAPHY  12/12/2010   mild LA dilatation, normal LV systolic fx, EF > 55%   XI ROBOTIC ASSISTED INGUINAL HERNIA REPAIR WITH  MESH Right 01/11/2022   Procedure: XI ROBOTIC ASSISTED INGUINAL HERNIA REPAIR WITH MESH;  Surgeon: Emmalene Hare, MD;  Location: [facility] ORS;  Service: General;  Laterality: Right;    Home Medications:  Allergies as of 04/17/2024   No Known Allergies      Medication List        Accurate as of April 17, 2024  9:25 AM. If you have any questions, ask your nurse or doctor.          amLODipine  10 MG tablet Commonly known as: NORVASC  Take 1 tablet (10 mg total) by mouth every evening.   atorvastatin  40  MG tablet Commonly known as: LIPITOR TAKE 1 TABLET(40 MG) BY MOUTH DAILY AT NIGHT   B-D 3CC LUER-LOK SYR 23GX1-1/2 23G X 1-1/2" 3 ML Misc Generic drug: SYRINGE-NEEDLE (DISP) 3 ML Use once every 14 days   B-D HYPODERMIC NEEDLE 18GX1.5" 18G X 1-1/2" Misc Generic drug: NEEDLE (DISP) 18 G DRAW UP MEDICATION WITH THIS NEEDLE AS DIRECTED   benazepril  40 MG tablet Commonly known as: LOTENSIN  Take 1 tablet (40 mg total) by mouth every evening.   clopidogrel  75 MG tablet Commonly known as: PLAVIX  Take 1 tablet (75 mg total) by mouth every evening.   ezetimibe  10 MG tablet Commonly known as: ZETIA  TAKE 1 TABLET(10 MG) BY MOUTH DAILY   ibuprofen 200 MG tablet Commonly known as: ADVIL Take 400 mg by mouth every 8 (eight) hours as needed (pain.).   multivitamin with minerals tablet Take 1 tablet by mouth every evening. Men 50+   pantoprazole  40 MG tablet Commonly known as: PROTONIX  Take 1 tablet (40 mg total) by mouth every evening. TAKE 1 TABLET BY MOUTH EVERY DAY 30 MINUTES BEFORE A MEAL   Semaglutide -Weight Management 2.4 MG/0.75ML Soaj Inject 2.4 mg into the skin once a week for 28 days. Start taking on: July 24, 2024   testosterone  cypionate 200 MG/ML injection Commonly known as: DEPOTESTOSTERONE CYPIONATE ADMINISTER 1.5 ML(300 MG) IN THE MUSCLE EVERY 14 DAYS         Family History: Family History  Problem Relation Age of Onset   Arthritis Mother    Heart disease Father        CAD - MI   Stroke Father    Hypertension Father    Cancer Father 10       esophageal cancer    Arthritis Maternal Grandmother     Social History:  reports that he has been smoking cigarettes. He has a 22 pack-year smoking history. He has never used smokeless tobacco. He reports current alcohol use of about 10.0 standard drinks of alcohol per week. He reports that he does not use drugs.   Physical Exam: BP (!) 146/78   Pulse 68   Ht 5\' 11"  (1.803 m)   Wt 179 lb (81.2 kg)   BMI  24.97 kg/m   Constitutional:  Alert and oriented, No acute distress. HEENT: [place] AT Respiratory: Normal respiratory effort, no increased work of breathing. GI: Abdomen is soft, nontender, nondistended, no abdominal masses Psychiatric: Normal mood and affect.   Assessment & Plan:    1.  Hypogonadism Stable. 6 month lab visit, testosterone . 1 year office visit with testosterone /PSA. PSA was slightly above baseline however was 1.7 in 2021 Hematocrit is followed by hematology.  2. Pancreatic cyst Noted on MRI 2024.  Radiology recommended follow-up MRI/MRCP versus pancreatic protocol CT in 2 years.  PCP was messaged and indicated they will follow   Geraline Knapp,  MD  [facility] [address], [address] [place], [place] [address] [phone]

## 2024-04-23 ENCOUNTER — Other Ambulatory Visit: Payer: Self-pay | Admitting: Family Medicine

## 2024-04-23 ENCOUNTER — Encounter: Payer: Self-pay | Admitting: Family Medicine

## 2024-04-23 DIAGNOSIS — I10 Essential (primary) hypertension: Secondary | ICD-10-CM

## 2024-04-23 DIAGNOSIS — E669 Obesity, unspecified: Secondary | ICD-10-CM

## 2024-04-23 MED ORDER — SEMAGLUTIDE-WEIGHT MANAGEMENT 2.4 MG/0.75ML ~~LOC~~ SOAJ: 2.4000 mg | SUBCUTANEOUS | 3 refills | Status: AC

## 2024-05-11 ENCOUNTER — Other Ambulatory Visit: Payer: Self-pay | Admitting: Urology

## 2024-06-01 ENCOUNTER — Ambulatory Visit (INDEPENDENT_AMBULATORY_CARE_PROVIDER_SITE_OTHER): Admitting: Internal Medicine

## 2024-06-01 VITALS — BP 142/81 | Resp 16 | Ht 70.0 in | Wt 180.0 lb

## 2024-06-01 DIAGNOSIS — G4733 Obstructive sleep apnea (adult) (pediatric): Secondary | ICD-10-CM

## 2024-06-01 DIAGNOSIS — Z7189 Other specified counseling: Secondary | ICD-10-CM | POA: Diagnosis not present

## 2024-06-01 NOTE — Progress Notes (Signed)
 Sleep Medicine   Office Visit  Patient Name: Christian Cole DOB: 1951-09-06 MRN [id number]    Chief Complaint: OSA  Brief History:  Christian Cole presents for an initial consult for sleep re-evaluation and to re-establish care. The patient has a 13 year history of sleep apnea and is currently on a CPAP. Prior to using a PAP, sleep quality was poor. This was noted every night. The patient reported the following symptoms:  headaches, snoring, trouble concentrating, brain fog and daytime sleepiness . The patient goes to sleep at 1100 pm and wakes up at 0630 am. The patient no history of psychiatric problems. The Epworth Sleepiness Score is 8 out of 24 . The patient relates  Cardiovascular risk factors include: HTN. The patient is currently on a APAP@ 15-20 cmH2O. The patient reports using his PAP and feels not as rested after sleeping with PAP.  The patient reports benefiting from PAP use and would like for him to continue using PAP. Reported sleepiness is  improved. The compliance download shows 99% compliance with an average use time of 7 hours 6  minutes. The AHI is 8.4. Leakage is 16.  95th % pressure is 18.  The patient continues to require PAP therapy as a medical necessity in order to eliminate his sleep apnea.     ROS  General: (-) fever, (-) chills, (-) night sweat Nose and Sinuses: (-) nasal stuffiness or itchiness, (-) postnasal drip, (-) nosebleeds, (-) sinus trouble. Mouth and Throat: (-) sore throat, (-) hoarseness. Neck: (-) swollen glands, (-) enlarged thyroid , (-) neck pain. Respiratory: - cough, - shortness of breath, - wheezing. Neurologic: - numbness, - tingling. Psychiatric: - anxiety, - depression Sleep behavior: -sleep paralysis -hypnogogic hallucinations -dream enactment      -vivid dreams -cataplexy -night terrors -sleep walking   Current Medication: Outpatient Encounter Medications as of 06/01/2024  Medication Sig   amLODipine  (NORVASC ) 10 MG tablet Take 1 tablet (10  mg total) by mouth every evening.   atorvastatin  (LIPITOR) 40 MG tablet TAKE 1 TABLET(40 MG) BY MOUTH DAILY AT NIGHT   B-D 3CC LUER-LOK SYR 23GX1-1/2 23G X 1-1/2 3 ML MISC Use once every 14 days   clopidogrel  (PLAVIX ) 75 MG tablet Take 1 tablet (75 mg total) by mouth every evening.   ezetimibe  (ZETIA ) 10 MG tablet TAKE 1 TABLET(10 MG) BY MOUTH DAILY   ibuprofen (ADVIL) 200 MG tablet Take 400 mg by mouth every 8 (eight) hours as needed (pain.).   Multiple Vitamins-Minerals (MULTIVITAMIN WITH MINERALS) tablet Take 1 tablet by mouth every evening. Men 50+   NEEDLE, DISP, 18 G (B-D HYPODERMIC NEEDLE 18GX1.5) 18G X 1-1/2 MISC DRAW UP MEDICATION WITH THIS NEEDLE AS DIRECTED   pantoprazole  (PROTONIX ) 40 MG tablet Take 1 tablet (40 mg total) by mouth every evening. TAKE 1 TABLET BY MOUTH EVERY DAY 30 MINUTES BEFORE A MEAL   [START ON 07/24/2024] Semaglutide -Weight Management 2.4 MG/0.75ML SOAJ Inject 2.4 mg into the skin once a week.   testosterone  cypionate (DEPOTESTOSTERONE CYPIONATE) 200 MG/ML injection ADMINISTER 1.5 ML(300 MG) IN THE MUSCLE EVERY 14 DAYS   [DISCONTINUED] benazepril  (LOTENSIN ) 40 MG tablet Take 1 tablet (40 mg total) by mouth every evening.   No facility-administered encounter medications on file as of 06/01/2024.    Surgical History: Past Surgical History:  Procedure Laterality Date   CATARACT EXTRACTION, BILATERAL  2010   COLONOSCOPY WITH PROPOFOL  N/A 04/15/2020   Procedure: COLONOSCOPY WITH PROPOFOL ;  Surgeon: Christian Reyes LELON, MD;  Location: [facility] ENDOSCOPY;  Service: Endoscopy;  Laterality: N/A;   CORONARY ARTERY BYPASS GRAFT  2000   LIMA to the LAD,RIMA to the PDA,vein graft to the diagonal & vein graft to the PLA   ESOPHAGOGASTRODUODENOSCOPY     Dr. Gaylyn   ESOPHAGOGASTRODUODENOSCOPY  04/15/2020   Procedure: ESOPHAGOGASTRODUODENOSCOPY (EGD);  Surgeon: Christian Reyes ORN, MD;  Location: [facility] ENDOSCOPY;  Service: Endoscopy;;   HOLEP-LASER ENUCLEATION OF THE PROSTATE  WITH MORCELLATION N/A 11/27/2021   Procedure: HOLEP-LASER ENUCLEATION OF THE PROSTATE WITH MORCELLATION;  Surgeon: Christian Knee, MD;  Location: [facility] ORS;  Service: Urology;  Laterality: N/A;   INGUINAL HERNIA REPAIR Left 01/11/2022   Procedure: HERNIA REPAIR INGUINAL ADULT;  Surgeon: Christian Schanz, MD;  Location: [facility] ORS;  Service: General;  Laterality: Left;   Cole ARTHROSCOPY Right 1991   Cole DEBRIDEMENT Right 1989   Fluid flush   PENILE PROSTHESIS IMPLANT     06/26/19 Dr. Levonia   SHOULDER ARTHROSCOPY Left 2011   UMBILICAL HERNIA REPAIR N/A 01/11/2022   Procedure: HERNIA REPAIR UMBILICAL ADULT;  Surgeon: Christian Schanz, MD;  Location: [facility] ORS;  Service: General;  Laterality: N/A;   US  ECHOCARDIOGRAPHY  12/12/2010   mild LA dilatation, normal LV systolic fx, EF > 55%   XI ROBOTIC ASSISTED INGUINAL HERNIA REPAIR WITH MESH Right 01/11/2022   Procedure: XI ROBOTIC ASSISTED INGUINAL HERNIA REPAIR WITH MESH;  Surgeon: Christian Schanz, MD;  Location: [facility] ORS;  Service: General;  Laterality: Right;    Medical History: Past Medical History:  Diagnosis Date   Aortic atherosclerosis (HCC)    BPH (benign prostatic hyperplasia)    CAD (coronary artery disease)    a. s/p CABG in 2000 with LIMA-LAD, RIMA-PDA, SVG-D1, and SVG-PLA b. low-risk NST in 2016   Carotid artery stenosis    Cyst of right kidney    ED (erectile dysfunction)    a.) penile implant in place   Fatty liver    GERD (gastroesophageal reflux disease)    Hyperlipidemia    Hypertension    Long term current use of antithrombotics/antiplatelets    a.) clopidogrel    Low testosterone     a.) on exogenous TRT   OSA on CPAP    Pre-diabetes    S/P CABG x 4 2000   a.) LIMA-LAD, RIMA-PDA, SVG-D1, SVG-PLA   Secondary erythrocytosis    a.) smoking + TRT + OSAH   Sinus bradycardia    Skin cancer    Thrombocytopenia (HCC)    Tongue lesion    benign from trauma as of 06/20/21    Family History: Non contributory to the present  illness  Social History: Social History   Socioeconomic History   Marital status: Married    Spouse name: Not on file   Number of children: 3   Years of education: 16   Highest education level: Bachelor's degree (e.g., BA, AB, BS)  Occupational History   Occupation: Retired Electronics engineer   Occupation: Management/Technical Support    Comment: Educational psychologist  Tobacco Use   Smoking status: Every Day    Current packs/day: 0.50    Average packs/day: 0.5 packs/day for 44.0 years (22.0 ttl pk-yrs)    Types: Cigarettes   Smokeless tobacco: Never   Tobacco comments:    on and off smoker   Vaping Use   Vaping status: Never Used  Substance and Sexual Activity   Alcohol use: Yes    Alcohol/week: 10.0 standard drinks of alcohol    Types: 10 Cans of beer per week  Comment: Down to 3 beers a day from 4-6 a day    Drug use: No   Sexual activity: Yes    Partners: Female    Comment: Wife  Other Topics Concern   Not on file  Social History Narrative   Quatavious grew up in [place] . He is currently living in [place] with his wife. This is his second marriage. He has 1 daughter and 2 sons from his first marriage. He has 4 step kids. His daughter lives in [place] , one son in [place], [place] and the other son in [place], [place]. 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       He worked Designer, industrial/product for a [employer]    He enjoys wood working on his spare time. 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 He also enjoys shooting sports - target shooting and [employer] Herbalist).       He retired 04/2019 and enjoys this       2 brothers and 2 sisters he is next to youngest    Social Drivers of Corporate investment banker Strain: Low Risk  (09/26/2023)   Overall Financial Resource Strain (CARDIA)    Difficulty of Paying Living Expenses: Not hard at all  Food Insecurity: No Food Insecurity (10/17/2023)   Hunger Vital Sign    Worried About Running Out of Food in the  Last Year: Never true    Ran Out of Food in the Last Year: Never true  Transportation Needs: No Transportation Needs (10/17/2023)   PRAPARE - Administrator, Civil Service (Medical): No    Lack of Transportation (Non-Medical): No  Physical Activity: Insufficiently Active (09/26/2023)   Exercise Vital Sign    Days of Exercise per Week: 6 days    Minutes of Exercise per Session: 20 min  Stress: No Stress Concern Present (09/26/2023)   [employer] of Occupational Health - Occupational Stress Questionnaire    Feeling of Stress : Not at all  Social Connections: Moderately Isolated (09/26/2023)   Social Connection and Isolation Panel    Frequency of Communication with Friends and Family: More than three times a week    Frequency of Social Gatherings with Friends and Family: Once a week    Attends Religious Services: Never    Database administrator or Organizations: No    Attends Engineer, structural: Not on file    Marital Status: Married  Catering manager Violence: Not At Risk (10/17/2023)   Humiliation, Afraid, Rape, and Kick questionnaire    Fear of Current or Ex-Partner: No    Emotionally Abused: No    Physically Abused: No    Sexually Abused: No    Vital Signs: Blood pressure (!) 142/81, resp. rate 16, height 5' 10 (1.778 m), weight 180 lb (81.6 kg), SpO2 97%. Body mass index is 25.83 kg/m.   Examination: General Appearance: The patient is well-developed, well-nourished, and in no distress. Neck Circumference:  Skin: Gross inspection of skin unremarkable. Head: normocephalic, no gross deformities. Eyes: no gross deformities noted. ENT: ears appear grossly normal Neurologic: Alert and oriented. No involuntary movements.    STOP BANG RISK ASSESSMENT S (snore) Have you been told that you snore?     NO   T (tired) Are you often tired, fatigued, or sleepy during the day?   NO  O (obstruction) Do you stop breathing, choke, or gasp during sleep? NO    P (pressure) Do you  have or are you being treated for high blood pressure? YES   B (BMI) Is your body index greater than 35 kg/m? NO   A (age) Are you 59 years old or older? YES   N (neck) Do you have a neck circumference greater than 16 inches?   YES   G (gender) Are you a male? YES   TOTAL STOP/BANG "YES" ANSWERS 4                                                               A STOP-Bang score of 2 or less is considered low risk, and a score of 5 or more is high risk for having either moderate or severe OSA. For people who score 3 or 4, doctors may need to perform further assessment to determine how likely they are to have OSA.         EPWORTH SLEEPINESS SCALE:  Scale:  (0)= no chance of dozing; (1)= slight chance of dozing; (2)= moderate chance of dozing; (3)= high chance of dozing  Chance  Situtation    Sitting and reading: 2    Watching TV: 2    Sitting Inactive in public: 0    As a passenger in car: 1      Lying down to rest: 2    Sitting and talking: 0    Sitting quielty after lunch: 1    In a car, stopped in traffic: 0   TOTAL SCORE:   8 out of 24    SLEEP STUDIES:  PSG (01/2011) AHI 63/hr Titration (01/2011) CPAP@ 12 cmH2O   LABS: Recent Results (from the past 2160 hours)  Lipid panel     Status: None   Collection Time: 03/30/24 12:24 PM  Result Value Ref Range   Cholesterol 121 0 - 200 mg/dL    Comment: ATP III Classification       Desirable:  < 200 mg/dL               Borderline High:  200 - 239 mg/dL          High:  > = 759 mg/dL   Triglycerides 35.9 0.0 - 149.0 mg/dL    Comment: Normal:  <849 mg/dLBorderline High:  150 - 199 mg/dL   HDL 40.99 >60.99 mg/dL   VLDL 87.1 0.0 - 59.9 mg/dL   LDL Cholesterol 49 0 - 99 mg/dL   Total CHOL/HDL Ratio 2     Comment:                Men          Women1/2 Average Risk     3.4          3.3Average Risk          5.0          4.42X Average Risk          9.6          7.13X Average Risk          15.0           11.0                       NonHDL 62.11     Comment: NOTE:  Non-HDL  goal should be 30 mg/dL higher than patient's LDL goal (i.e. LDL goal of < 70 mg/dL, would have non-HDL goal of < 100 mg/dL)  CBC with Differential/Platelet     Status: Abnormal   Collection Time: 03/30/24 12:24 PM  Result Value Ref Range   WBC 7.0 4.0 - 10.5 K/uL   RBC 5.31 4.22 - 5.81 Mil/uL   Hemoglobin 17.2 (H) 13.0 - 17.0 g/dL   HCT 49.2 60.9 - 47.9 %   MCV 95.4 78.0 - 100.0 fl   MCHC 33.9 30.0 - 36.0 g/dL   RDW 84.4 88.4 - 84.4 %   Platelets 127.0 (L) 150.0 - 400.0 K/uL   Neutrophils Relative % 47.2 43.0 - 77.0 %   Lymphocytes Relative 40.3 12.0 - 46.0 %   Monocytes Relative 11.0 3.0 - 12.0 %   Eosinophils Relative 1.2 0.0 - 5.0 %   Basophils Relative 0.3 0.0 - 3.0 %   Neutro Abs 3.3 1.4 - 7.7 K/uL   Lymphs Abs 2.8 0.7 - 4.0 K/uL   Monocytes Absolute 0.8 0.1 - 1.0 K/uL   Eosinophils Absolute 0.1 0.0 - 0.7 K/uL   Basophils Absolute 0.0 0.0 - 0.1 K/uL  Comprehensive metabolic panel with GFR     Status: None   Collection Time: 03/30/24 12:24 PM  Result Value Ref Range   Sodium 136 135 - 145 mEq/L   Potassium 4.2 3.5 - 5.1 mEq/L   Chloride 101 96 - 112 mEq/L   CO2 26 19 - 32 mEq/L   Glucose, Bld 92 70 - 99 mg/dL   BUN 15 6 - 23 mg/dL   Creatinine, Ser 8.87 0.40 - 1.50 mg/dL   Total Bilirubin 0.9 0.2 - 1.2 mg/dL   Alkaline Phosphatase 76 39 - 117 U/L   AST 24 0 - 37 U/L   ALT 25 0 - 53 U/L   Total Protein 7.2 6.0 - 8.3 g/dL   Albumin 4.7 3.5 - 5.2 g/dL   GFR 35.11 >39.99 mL/min    Comment: Calculated using the CKD-EPI Creatinine Equation (2021)   Calcium  9.7 8.4 - 10.5 mg/dL  Vitamin B12     Status: None   Collection Time: 03/30/24 12:24 PM  Result Value Ref Range   Vitamin B-12 835 211 - 911 pg/mL  VITAMIN D  25 Hydroxy (Vit-D Deficiency, Fractures)     Status: None   Collection Time: 03/30/24 12:24 PM  Result Value Ref Range   VITD 39.45 30.00 - 100.00 ng/mL  PSA     Status: None   Collection  Time: 04/13/24  8:18 AM  Result Value Ref Range   Prostate Specific Ag, Serum 1.4 0.0 - 4.0 ng/mL    Comment: Roche ECLIA methodology. According to the American Urological Association, Serum PSA should decrease and remain at undetectable levels after radical prostatectomy. The AUA defines biochemical recurrence as an initial PSA value 0.2 ng/mL or greater followed by a subsequent confirmatory PSA value 0.2 ng/mL or greater. Values obtained with different assay methods or kits cannot be used interchangeably. Results cannot be interpreted as absolute evidence of the presence or absence of malignant disease.   Testosterone      Status: None   Collection Time: 04/13/24  8:18 AM  Result Value Ref Range   Testosterone  625 264 - 916 ng/dL    Comment: Adult male reference interval is based on a population of healthy nonobese males (BMI <30) between 33 and 55 years old. Travison, et.al. JCEM [phone]. PMID: 71675896.  Radiology: CT CHEST LUNG CA SCREEN LOW DOSE W/O CM Result Date: 04/09/2024 CLINICAL DATA:  40 pack-year smoking history/current smoker EXAM: CT CHEST WITHOUT CONTRAST LOW-DOSE FOR LUNG CANCER SCREENING TECHNIQUE: Multidetector CT imaging of the chest was performed following the standard protocol without IV contrast. RADIATION DOSE REDUCTION: This exam was performed according to the departmental dose-optimization program which includes automated exposure control, adjustment of the mA and/or kV according to patient size and/or use of iterative reconstruction technique. COMPARISON:  03/14/2022 FINDINGS: Cardiovascular: Upper normal ascending aortic caliber is similar at 4.0 cm. Aortic atherosclerosis. Tortuous thoracic aorta. Mild cardiomegaly, without pericardial effusion. Median sternotomy for CABG. Mediastinum/Nodes: No mediastinal or hilar adenopathy, given limitations of unenhanced CT. Lungs/Pleura: No pleural fluid. Moderate centrilobular emphysema. Pulmonary nodules of  maximally 3.6 mm. Upper Abdomen: Colonic stool burden suggests constipation. Normal imaged portions of the liver, spleen, stomach, pancreas, gallbladder, adrenal glands, kidneys. Musculoskeletal: Intact sternotomy wires. IMPRESSION: Lung-RADS 2, benign appearance or behavior. Continue annual screening with low-dose chest CT without contrast in 12 months. Aortic Atherosclerosis (ICD10-I70.0) and Emphysema (ICD10-J43.9). Electronically Signed   By: Rockey Kilts M.D.   On: 04/09/2024 16:00    No results found.  No results found.    Assessment and Plan: Patient Active Problem List   Diagnosis Date Noted   CPAP use counseling 06/01/2024   Osteoarthritis, hip, bilateral 04/05/2024   Vitamin B 12 deficiency 04/05/2024   Pancreatic cyst 08/20/2023   Aortic atherosclerosis (HCC) 04/12/2023   Emphysema lung (HCC) 04/12/2023   IPMN (intraductal papillary mucinous neoplasm) 04/12/2023   Ascending aorta dilation (HCC) 04/12/2023   Non-recurrent bilateral inguinal hernia without obstruction or gangrene    Umbilical hernia without obstruction and without gangrene    Low testosterone  in male 06/20/2021   Secondary erythrocytosis 12/28/2020   Gastroesophageal reflux disease 12/21/2020   Hypogonadism in male 09/18/2020   Obesity (BMI 30-39.9) 06/15/2020   Cyst of right kidney 06/15/2020   Vitamin D  deficiency 12/16/2019   Carotid artery stenosis 06/11/2019   Prediabetes 07/15/2018   Fatty liver 02/05/2018   History of prediabetes 12/04/2017   Thrombocytopenia (HCC) 12/04/2017   History of skin cancer 12/04/2017   Sinus bradycardia 08/26/2017   Tobacco use 08/26/2017   OSA (obstructive sleep apnea) 05/03/2017   Preventative health care 05/03/2016   CAD (coronary artery disease) 02/04/2014   HLD (hyperlipidemia) 01/06/2014   HTN (hypertension) 01/06/2014   ED (erectile dysfunction) 01/06/2014   1. OSA (obstructive sleep apnea) (Primary) Patient evaluation reveals his apnea not to be  controlled. He is on APAP 15-20 with an AHI of 8.5, and nights where he is having 20 and more events an hour are visible in his MyAir app. He is no longer feeling the benefit of the therapy. We will make an adjustment in his pressure to 12-20, and do a 2 week card download, and set up a titration to determine optimal pressure. He will f/u after setup.   2. CPAP use counseling CPAP Counseling: had a lengthy discussion with the patient regarding the importance of PAP therapy in management of the sleep apnea. Patient appears to understand the risk factor reduction and also understands the risks associated with untreated sleep apnea. Patient will try to make a good faith effort to remain compliant with therapy. Also instructed the patient on proper cleaning of the device including the water must be changed daily if possible and use of distilled water is preferred. Patient understands that the machine should be regularly cleaned with appropriate  recommended cleaning solutions that do not damage the PAP machine for example given white vinegar and water rinses. Other methods such as ozone treatment may not be as good as these simple methods to achieve cleaning.    PLAN OSA:   Patient evaluation reveals his apnea not to be controlled. He is on APAP 15-20 with an AHI of 8.5, and nights where he is having 20 and more events an hour are visible in his MyAir app. He is no longer feeling the benefit of the therapy. We will make an adjustment in his pressure to 12-20, and do a 2 week card download, and set up a titration to determine optimal pressure. He will f/u after setup.  PLAN hypersomnia:   General Counseling: I have discussed the findings of the evaluation and examination with Abran.  I have also discussed any further diagnostic evaluation thatmay be needed or ordered today. Kathleen verbalizes understanding of the findings of todays visit. We also reviewed his medications today and discussed drug interactions and  side effects including but not limited excessive drowsiness and altered mental states. We also discussed that there is always a risk not just to him but also people around him. he has been encouraged to call the office with any questions or concerns that should arise related to todays visit.  No orders of the defined types were placed in this encounter.       I have personally obtained a history, evaluated the patient, evaluated pertinent data, formulated the assessment and plan and placed orders.   This patient was seen today by Lauraine Lay, PA-C in collaboration with Dr. Elfreda Bathe.   Elfreda DELENA Bathe, MD [facility] Diplomate ABMS Pulmonary and Critical Care Medicine Sleep medicine

## 2024-06-01 NOTE — Patient Instructions (Signed)

## 2024-06-25 DIAGNOSIS — G4733 Obstructive sleep apnea (adult) (pediatric): Secondary | ICD-10-CM | POA: Diagnosis not present

## 2024-07-16 ENCOUNTER — Ambulatory Visit: Admitting: Internal Medicine

## 2024-07-27 ENCOUNTER — Ambulatory Visit (INDEPENDENT_AMBULATORY_CARE_PROVIDER_SITE_OTHER): Admitting: Internal Medicine

## 2024-07-27 VITALS — BP 146/81 | HR 56 | Resp 16 | Ht 70.0 in | Wt 185.0 lb

## 2024-07-27 DIAGNOSIS — G4761 Periodic limb movement disorder: Secondary | ICD-10-CM | POA: Diagnosis not present

## 2024-07-27 DIAGNOSIS — G4733 Obstructive sleep apnea (adult) (pediatric): Secondary | ICD-10-CM

## 2024-07-27 DIAGNOSIS — Z7189 Other specified counseling: Secondary | ICD-10-CM

## 2024-07-27 NOTE — Progress Notes (Unsigned)
 [facility] [address] [place], [place] [address]  Pulmonary Sleep Medicine   Office Visit Note  Patient Name: [name] DOB: 1951-02-20 MRN [id number]    Chief Complaint: Obstructive Sleep Apnea visit  Brief History:  Christian Cole is seen today for a follow up visit for APAP@ 12-20 cmH2O. The patient has a 13 year history of sleep apnea. Patient is using PAP nightly.  The patient feels rested after sleeping with PAP.  The patient reports benefiting from PAP use. Reported sleepiness is  improved and the Epworth Sleepiness Score is 2 out of 24. The patient does not take naps. The patient complains of the following: occasional belching in the morning.  The compliance download shows 97% compliance with an average use time of 6 hours 47 minutes. The AHI is 8.5. The patient does complain of limb movements disrupting sleep. The patient continues to require PAP therapy in order to eliminate sleep apnea.   ROS  General: (-) fever, (-) chills, (-) night sweat Nose and Sinuses: (-) nasal stuffiness or itchiness, (-) postnasal drip, (-) nosebleeds, (-) sinus trouble. Mouth and Throat: (-) sore throat, (-) hoarseness. Neck: (-) swollen glands, (-) enlarged thyroid , (-) neck pain. Respiratory: - cough, - shortness of breath, - wheezing. Neurologic: - numbness, - tingling. Psychiatric: - anxiety, - depression   Current Medication: Outpatient Encounter Medications as of 07/27/2024  Medication Sig   amLODipine  (NORVASC ) 10 MG tablet Take 1 tablet (10 mg total) by mouth every evening.   atorvastatin  (LIPITOR) 40 MG tablet TAKE 1 TABLET(40 MG) BY MOUTH DAILY AT NIGHT   B-D 3CC LUER-LOK SYR 23GX1-1/2 23G X 1-1/2 3 ML MISC Use once every 14 days   clopidogrel  (PLAVIX ) 75 MG tablet Take 1 tablet (75 mg total) by mouth every evening.   ezetimibe  (ZETIA ) 10 MG tablet TAKE 1 TABLET(10 MG) BY MOUTH DAILY   ibuprofen (ADVIL) 200 MG tablet Take 400 mg by mouth every 8 (eight) hours as  needed (pain.).   Multiple Vitamins-Minerals (MULTIVITAMIN WITH MINERALS) tablet Take 1 tablet by mouth every evening. Men 50+   NEEDLE, DISP, 18 G (B-D HYPODERMIC NEEDLE 18GX1.5) 18G X 1-1/2 MISC DRAW UP MEDICATION WITH THIS NEEDLE AS DIRECTED   pantoprazole  (PROTONIX ) 40 MG tablet Take 1 tablet (40 mg total) by mouth every evening. TAKE 1 TABLET BY MOUTH EVERY DAY 30 MINUTES BEFORE A MEAL   Semaglutide -Weight Management 2.4 MG/0.75ML SOAJ Inject 2.4 mg into the skin once a week.   testosterone  cypionate (DEPOTESTOSTERONE CYPIONATE) 200 MG/ML injection ADMINISTER 1.5 ML(300 MG) IN THE MUSCLE EVERY 14 DAYS   No facility-administered encounter medications on file as of 07/27/2024.    Surgical History: Past Surgical History:  Procedure Laterality Date   CATARACT EXTRACTION, BILATERAL  2010   COLONOSCOPY WITH PROPOFOL  N/A 04/15/2020   Procedure: COLONOSCOPY WITH PROPOFOL ;  Surgeon: Dessa Reyes LELON, MD;  Location: [facility] ENDOSCOPY;  Service: Endoscopy;  Laterality: N/A;   CORONARY ARTERY BYPASS GRAFT  2000   LIMA to the LAD,RIMA to the PDA,vein graft to the diagonal & vein graft to the PLA   ESOPHAGOGASTRODUODENOSCOPY     Dr. Gaylyn   ESOPHAGOGASTRODUODENOSCOPY  04/15/2020   Procedure: ESOPHAGOGASTRODUODENOSCOPY (EGD);  Surgeon: Dessa Reyes LELON, MD;  Location: [facility];  Service: Endoscopy;;   HOLEP-LASER ENUCLEATION OF THE PROSTATE WITH MORCELLATION N/A 11/27/2021   Procedure: HOLEP-LASER ENUCLEATION OF THE PROSTATE WITH MORCELLATION;  Surgeon: Penne Knee, MD;  Location: [facility] ORS;  Service: Urology;  Laterality: N/A;  INGUINAL HERNIA REPAIR Left 01/11/2022   Procedure: HERNIA REPAIR INGUINAL ADULT;  Surgeon: Desiderio Schanz, MD;  Location: [facility] ORS;  Service: General;  Laterality: Left;   KNEE ARTHROSCOPY Right 1991   KNEE DEBRIDEMENT Right 1989   Fluid flush   PENILE PROSTHESIS IMPLANT     06/26/19 Dr. Levonia   SHOULDER ARTHROSCOPY Left 2011   UMBILICAL HERNIA REPAIR N/A  01/11/2022   Procedure: HERNIA REPAIR UMBILICAL ADULT;  Surgeon: Desiderio Schanz, MD;  Location: [facility] ORS;  Service: General;  Laterality: N/A;   US  ECHOCARDIOGRAPHY  12/12/2010   mild LA dilatation, normal LV systolic fx, EF > 55%   XI ROBOTIC ASSISTED INGUINAL HERNIA REPAIR WITH MESH Right 01/11/2022   Procedure: XI ROBOTIC ASSISTED INGUINAL HERNIA REPAIR WITH MESH;  Surgeon: Desiderio Schanz, MD;  Location: [facility] ORS;  Service: General;  Laterality: Right;    Medical History: Past Medical History:  Diagnosis Date   Aortic atherosclerosis (HCC)    BPH (benign prostatic hyperplasia)    CAD (coronary artery disease)    a. s/p CABG in 2000 with LIMA-LAD, RIMA-PDA, SVG-D1, and SVG-PLA b. low-risk NST in 2016   Carotid artery stenosis    Cyst of right kidney    ED (erectile dysfunction)    a.) penile implant in place   Fatty liver    GERD (gastroesophageal reflux disease)    Hyperlipidemia    Hypertension    Long term current use of antithrombotics/antiplatelets    a.) clopidogrel    Low testosterone     a.) on exogenous TRT   OSA on CPAP    Pre-diabetes    S/P CABG x 4 2000   a.) LIMA-LAD, RIMA-PDA, SVG-D1, SVG-PLA   Secondary erythrocytosis    a.) smoking + TRT + OSAH   Sinus bradycardia    Skin cancer    Thrombocytopenia (HCC)    Tongue lesion    benign from trauma as of 06/20/21    Family History: Non contributory to the present illness  Social History: Social History   Socioeconomic History   Marital status: Married    Spouse name: Not on file   Number of children: 3   Years of education: 16   Highest education level: Bachelor's degree (e.g., BA, AB, BS)  Occupational History   Occupation: Retired Electronics engineer   Occupation: Management/Technical Support    Comment: Educational psychologist  Tobacco Use   Smoking status: Every Day    Current packs/day: 0.50    Average packs/day: 0.5 packs/day for 44.0 years (22.0 ttl pk-yrs)    Types: Cigarettes   Smokeless tobacco: Never   Tobacco  comments:    on and off smoker   Vaping Use   Vaping status: Never Used  Substance and Sexual Activity   Alcohol use: Yes    Alcohol/week: 10.0 standard drinks of alcohol    Types: 10 Cans of beer per week    Comment: Down to 3 beers a day from 4-6 a day    Drug use: No   Sexual activity: Yes    Partners: Female    Comment: Wife  Other Topics Concern   Not on file  Social History Narrative   Karey grew up in [place] . He is currently living in [place] with his wife. This is his second marriage. He has 1 daughter and 2 sons from his first marriage. He has 4 step kids. His daughter lives in [place] , one son in [place], [place] and the other son in [place], [place]. 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       He worked Designer, industrial/product for a [employer]    He enjoys wood working on his spare time. 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 He also enjoys shooting sports - target shooting and [employer] Herbalist).       He retired 04/2019 and enjoys this       2 brothers and 2 sisters he is next to youngest    Social Drivers of Corporate investment banker Strain: Low Risk  (09/26/2023)   Overall Financial Resource Strain (CARDIA)    Difficulty of Paying Living Expenses: Not hard at all  Food Insecurity: No Food Insecurity (10/17/2023)   Hunger Vital Sign    Worried About Running Out of Food in the Last Year: Never true    Ran Out of Food in the Last Year: Never true  Transportation Needs: No Transportation Needs (10/17/2023)   PRAPARE - Administrator, Civil Service (Medical): No    Lack of Transportation (Non-Medical): No  Physical Activity: Insufficiently Active (09/26/2023)   Exercise Vital Sign    Days of Exercise per Week: 6 days    Minutes of Exercise per Session: 20 min  Stress: No Stress Concern Present (09/26/2023)   [employer] of Occupational Health - Occupational Stress Questionnaire    Feeling of Stress : Not at all   Social Connections: Moderately Isolated (09/26/2023)   Social Connection and Isolation Panel    Frequency of Communication with Friends and Family: More than three times a week    Frequency of Social Gatherings with Friends and Family: Once a week    Attends Religious Services: Never    Database administrator or Organizations: No    Attends Engineer, structural: Not on file    Marital Status: Married  Catering manager Violence: Not At Risk (10/17/2023)   Humiliation, Afraid, Rape, and Kick questionnaire    Fear of Current or Ex-Partner: No    Emotionally Abused: No    Physically Abused: No    Sexually Abused: No    Vital Signs: Blood pressure (!) 146/81, pulse (!) 56, resp. rate 16, height 5' 10 (1.778 m), weight 185 lb (83.9 kg), SpO2 100%. Body mass index is 26.54 kg/m.    Examination: General Appearance: The patient is well-developed, well-nourished, and in no distress. Neck Circumference: 45 cm Skin: Gross inspection of skin unremarkable. Head: normocephalic, no gross deformities. Eyes: no gross deformities noted. ENT: ears appear grossly normal Neurologic: Alert and oriented. No involuntary movements.  STOP BANG RISK ASSESSMENT S (snore) Have you been told that you snore?     NO   T (tired) Are you often tired, fatigued, or sleepy during the day?   NO  O (obstruction) Do you stop breathing, choke, or gasp during sleep? NO   P (pressure) Do you have or are you being treated for high blood pressure? YES   B (BMI) Is your body index greater than 35 kg/m? NO   A (age) Are you 46 years old or older? YES   N (neck) Do you have a neck circumference greater than 16 inches?   YES   G (gender) Are you a male? YES   TOTAL STOP/BANG "YES" ANSWERS 4       A STOP-Bang score of 2 or less is considered low risk, and a score of 5 or more is high risk for having either moderate  or severe OSA. For people who score 3 or 4, doctors may need to perform further assessment  to determine how likely they are to have OSA.         EPWORTH SLEEPINESS SCALE:  Scale:  (0)= no chance of dozing; (1)= slight chance of dozing; (2)= moderate chance of dozing; (3)= high chance of dozing  Chance  Situtation    Sitting and reading: 1    Watching TV: 1    Sitting Inactive in public: 0    As a passenger in car: 0      Lying down to rest: 0    Sitting and talking: 0    Sitting quielty after lunch: 0    In a car, stopped in traffic: 0   TOTAL SCORE:   2 out of 24    SLEEP STUDIES:  PSG (01/2011) AHI 63/hr Titration (01/2011) CPAP@ 12 cmH2O   CPAP COMPLIANCE DATA:  Date Range: 05/29/2023-07/26/2024  Average Daily Use: 6 hours 47 minutes  Median Use: 6 hours 42 minutes  Compliance for > 4 Hours: 97%  AHI: 8.5 respiratory events per hour  Days Used: 59/60 days  Mask Leak: 8.7  95th Percentile Pressure: 18.4         LABS: No results found for this or any previous visit (from the past 2160 hours).  Radiology: CT CHEST LUNG CA SCREEN LOW DOSE W/O CM Result Date: 04/09/2024 CLINICAL DATA:  40 pack-year smoking history/current smoker EXAM: CT CHEST WITHOUT CONTRAST LOW-DOSE FOR LUNG CANCER SCREENING TECHNIQUE: Multidetector CT imaging of the chest was performed following the standard protocol without IV contrast. RADIATION DOSE REDUCTION: This exam was performed according to the departmental dose-optimization program which includes automated exposure control, adjustment of the mA and/or kV according to patient size and/or use of iterative reconstruction technique. COMPARISON:  03/14/2022 FINDINGS: Cardiovascular: Upper normal ascending aortic caliber is similar at 4.0 cm. Aortic atherosclerosis. Tortuous thoracic aorta. Mild cardiomegaly, without pericardial effusion. Median sternotomy for CABG. Mediastinum/Nodes: No mediastinal or hilar adenopathy, given limitations of unenhanced CT. Lungs/Pleura: No pleural fluid. Moderate centrilobular  emphysema. Pulmonary nodules of maximally 3.6 mm. Upper Abdomen: Colonic stool burden suggests constipation. Normal imaged portions of the liver, spleen, stomach, pancreas, gallbladder, adrenal glands, kidneys. Musculoskeletal: Intact sternotomy wires. IMPRESSION: Lung-RADS 2, benign appearance or behavior. Continue annual screening with low-dose chest CT without contrast in 12 months. Aortic Atherosclerosis (ICD10-I70.0) and Emphysema (ICD10-J43.9). Electronically Signed   By: Rockey Kilts M.D.   On: 04/09/2024 16:00    No results found.  No results found.    Assessment and Plan: Patient Active Problem List   Diagnosis Date Noted   Periodic limb movements of sleep 07/27/2024   CPAP use counseling 06/01/2024   Osteoarthritis, hip, bilateral 04/05/2024   Vitamin B 12 deficiency 04/05/2024   Pancreatic cyst 08/20/2023   Aortic atherosclerosis (HCC) 04/12/2023   Emphysema lung (HCC) 04/12/2023   IPMN (intraductal papillary mucinous neoplasm) 04/12/2023   Ascending aorta dilation (HCC) 04/12/2023   Non-recurrent bilateral inguinal hernia without obstruction or gangrene    Umbilical hernia without obstruction and without gangrene    Low testosterone  in male 06/20/2021   Secondary erythrocytosis 12/28/2020   Gastroesophageal reflux disease 12/21/2020   Hypogonadism in male 09/18/2020   Obesity (BMI 30-39.9) 06/15/2020   Cyst of right kidney 06/15/2020   Vitamin D  deficiency 12/16/2019   Carotid artery stenosis 06/11/2019   Prediabetes 07/15/2018   Fatty liver 02/05/2018   History of prediabetes 12/04/2017  Thrombocytopenia (HCC) 12/04/2017   History of skin cancer 12/04/2017   Sinus bradycardia 08/26/2017   Tobacco use 08/26/2017   OSA (obstructive sleep apnea) 05/03/2017   Preventative health care 05/03/2016   CAD (coronary artery disease) 02/04/2014   HLD (hyperlipidemia) 01/06/2014   HTN (hypertension) 01/06/2014   ED (erectile dysfunction) 01/06/2014    1. OSA  (obstructive sleep apnea) (Primary) The patient does tolerate PAP and reports  benefit from PAP use. His apnea control appears better since we changed his APAP pressure to 12-20. His titration recommends a pressure of 13 and we will do that for a 2 week trial. He admits to alcohol use and restlessness at night which likely contributes to his apnea. The patient was reminded how to clean equipment and advised to replace supplies routinely. The patient was also counselled on weight loss. The compliance is excellent. The AHI is 8.5.   OSA on cpap- not optimally controlled due to alcohol, restlessness. Will try titration recommended pressure at 13 cm. If no improvement will go back to 12-20. F/u one year.   2. CPAP use counseling CPAP Counseling: had a lengthy discussion with the patient regarding the importance of PAP therapy in management of the sleep apnea. Patient appears to understand the risk factor reduction and also understands the risks associated with untreated sleep apnea. Patient will try to make a good faith effort to remain compliant with therapy. Also instructed the patient on proper cleaning of the device including the water must be changed daily if possible and use of distilled water is preferred. Patient understands that the machine should be regularly cleaned with appropriate recommended cleaning solutions that do not damage the PAP machine for example given white vinegar and water rinses. Other methods such as ozone treatment may not be as good as these simple methods to achieve cleaning.   3. Periodic limb movements of sleep We discussed these. At this time will monitor.    General Counseling: I have discussed the findings of the evaluation and examination with Abran.  I have also discussed any further diagnostic evaluation thatmay be needed or ordered today. Coden verbalizes understanding of the findings of todays visit. We also reviewed his medications today and discussed drug  interactions and side effects including but not limited excessive drowsiness and altered mental states. We also discussed that there is always a risk not just to him but also people around him. he has been encouraged to call the office with any questions or concerns that should arise related to todays visit.  No orders of the defined types were placed in this encounter.       I have personally obtained a history, examined the patient, evaluated laboratory and imaging results, formulated the assessment and plan and placed orders. This patient was seen today by Lauraine Lay, PA-C in collaboration with Dr. Elfreda Bathe.   Elfreda DELENA Bathe, MD [facility] Diplomate ABMS Pulmonary Critical Care Medicine and Sleep Medicine

## 2024-07-27 NOTE — Patient Instructions (Signed)
 Living With Sleep Apnea Sleep apnea is a condition that affects your breathing while you're sleeping. Your tongue or the tissue in your throat may block the flow of air while you sleep. You may have shallow breathing or stop breathing for short periods of time. The breaks in breathing interrupt the deep sleep that you need to feel rested. Even if you don't wake up from the gaps in breathing, you may feel tired during the day. People with sleep apnea may snore loudly. You may have a headache in the morning and feel anxious or depressed. How can sleep apnea affect me? Sleep apnea increases your chances of being very tired during the day. This is called daytime fatigue. Sleep apnea can also increase your risk of: Heart attack. Stroke. Obesity. Type 2 diabetes. Heart failure. Irregular heartbeat. High blood pressure. If you are very tired during the day, you may be more likely to: Not do well in school or at work. Fall asleep while driving. Have trouble paying attention. Develop depression or anxiety. Have problems having sex. This is called sexual dysfunction. What actions can I take to manage sleep apnea? Sleep apnea treatment  If you were given a device to open your airway while you sleep, use it only as told by your health care provider. You may be given: An oral appliance. This is a mouthpiece that shifts your lower jaw forward. A continuous positive airway pressure (CPAP) device. This blows air through a mask. A nasal expiratory positive airway pressure (EPAP) device. This has valves that you put into each nostril. A bi-level positive airway pressure (BIPAP) device. This blows air through a mask when you breathe in and breathe out. You may need surgery if other treatments don't work for you. Sleep habits Go to sleep and wake up at the same time every day. This helps set your internal clock for sleeping. If you stay up later than usual on weekends, try to get up in the morning within 2  hours of the time you usually wake up. Try to get at least 7-9 hours of sleep each night. Stop using a computer, tablet, and mobile phone a few hours before bedtime. Do not take long naps during the day. If you nap, limit it to 30 minutes. Have a relaxing bedtime routine. Reading or listening to music may relax you and help you sleep. Use your bedroom only for sleep. Keep your television and computer out of your bedroom. Keep your bedroom cool, dark, and quiet. Use a supportive mattress and pillows. Follow your provider's instructions for other changes to sleep habits. Nutrition Do not eat big meals in the evening. Do not have caffeine in the later part of the day. The effects of caffeine can last for more than 5 hours. Follow your provider's instructions for any changes to what you eat and drink. Lifestyle Do not drink alcohol before bedtime. Alcohol can cause you to fall asleep at first, but then it can cause you to wake up in the middle of the night and have trouble getting back to sleep. Do not smoke, vape, or use nicotine or tobacco. Medicines Take over-the-counter and prescription medicines only as told by your provider. Do not use over-the-counter sleep medicine. You may become dependent on this medicine, and it can make sleep apnea worse. Do not take medicines, such as sedatives and narcotics, unless told to by your provider. Activity Exercise on most days, but avoid exercising in the evening. Exercising near bedtime can interfere with sleeping.  If possible, spend time outside every day. Natural light helps with your internal clock. General information Lose weight if you need to. Stay at a healthy weight. If you are having surgery, make sure to tell your provider that you have sleep apnea. You may need to bring your device with you. Keep all follow-up visits. Your provider will want to check on your condition. Where to find more information National Heart, Lung, and Blood  Institute: [employer] This information is not intended to replace advice given to you by your health care provider. Make sure you discuss any questions you have with your health care provider. Document Revised: 02/20/2023 Document Reviewed: 02/20/2023 Elsevier Patient Education  2024 [employer].

## 2024-08-03 ENCOUNTER — Other Ambulatory Visit: Payer: Self-pay | Admitting: Urology

## 2024-08-04 ENCOUNTER — Telehealth: Payer: Self-pay | Admitting: Pharmacy Technician

## 2024-08-04 ENCOUNTER — Other Ambulatory Visit (HOSPITAL_COMMUNITY): Payer: Self-pay

## 2024-08-04 NOTE — Telephone Encounter (Signed)
 Pharmacy Patient Advocate Encounter   Received notification from Fax that prior authorization for Wegovy  2.4MG /0.75ML auto-injectors  is required/requested.   Insurance verification completed.   The patient is insured through [employer] . Key: [id number]   Per test claim: Per test claim, medication is not covered due to plan/benefit exclusion, PA not submitted at this time

## 2024-08-04 NOTE — Telephone Encounter (Signed)
 Pt notified via mychart

## 2024-08-18 ENCOUNTER — Encounter: Payer: Self-pay | Admitting: Oncology

## 2024-08-19 ENCOUNTER — Encounter: Payer: Self-pay | Admitting: Internal Medicine

## 2024-08-19 ENCOUNTER — Ambulatory Visit: Attending: Internal Medicine | Admitting: Internal Medicine

## 2024-08-19 VITALS — BP 112/64 | HR 57 | Ht 71.0 in | Wt 178.6 lb

## 2024-08-19 DIAGNOSIS — E669 Obesity, unspecified: Secondary | ICD-10-CM | POA: Diagnosis not present

## 2024-08-19 DIAGNOSIS — I440 Atrioventricular block, first degree: Secondary | ICD-10-CM | POA: Diagnosis not present

## 2024-08-19 DIAGNOSIS — I10 Essential (primary) hypertension: Secondary | ICD-10-CM | POA: Insufficient documentation

## 2024-08-19 DIAGNOSIS — G4733 Obstructive sleep apnea (adult) (pediatric): Secondary | ICD-10-CM | POA: Diagnosis not present

## 2024-08-19 DIAGNOSIS — I2510 Atherosclerotic heart disease of native coronary artery without angina pectoris: Secondary | ICD-10-CM | POA: Diagnosis not present

## 2024-08-19 DIAGNOSIS — R0989 Other specified symptoms and signs involving the circulatory and respiratory systems: Secondary | ICD-10-CM | POA: Insufficient documentation

## 2024-08-19 DIAGNOSIS — R200 Anesthesia of skin: Secondary | ICD-10-CM | POA: Diagnosis not present

## 2024-08-19 MED ORDER — NITROGLYCERIN 0.4 MG SL SUBL
0.4000 mg | SUBLINGUAL_TABLET | SUBLINGUAL | 3 refills | Status: AC | PRN
Start: 2024-08-19 — End: 2024-11-17

## 2024-08-19 NOTE — Progress Notes (Signed)
 Cardiology Office Note:  .   Date:  08/19/2024  ID:  Christian Cole, DOB [birth year], MRN [id number] PCP: Gretel App, NP  [facility] Providers Cardiologist:  New - previously Dr. Burnard Finn to update primary MD,subspecialty MD or APP then REFRESH:1}    History of Present Illness: .   Christian Cole is a 73 y.o. male with history of coronary artery disease status post CABG in 2000 (LIMA-LAD, RIMA and-PDA, SVG-D1, and SVG-PLA), carotid artery stenosis, hypertension, hyperlipidemia, obstructive sleep apnea on CPAP, and thrombocytopenia, who presents for follow-up of coronary artery disease.  He was previously followed by Dr. Burnard, having last been seen in 08/2023.  He was doing well at that time.  Today, Christian Cole reports that he has been feeling well from a heart standpoint, denying chest pain, shortness of breath, palpitations, and edema.  He has rare orthostatic lightheadedness but has not passed out or fallen.  He typically tries to walk about a mile a day, though he has not been using his treadmill as much recently.  He notes some numbness in his toes, which also feel cold at times.  He does not have any calf pain with ambulation nor wounds/ulcers on either lower extremity.  Christian Cole notes that he had an allergic reaction in May and was evaluated in the ED.  It was postulated that benazepril  may have led to angioedema, as there were no other exposures to explain his reaction.  He has not been on benazepril  since then and has not had any further tongue swelling.  ROS: See HPI  Studies Reviewed: SABRA   EKG Interpretation Date/Time:  Wednesday August 19 2024 09:34:42 EDT Ventricular Rate:  57 PR Interval:  302 QRS Duration:  104 QT Interval:  420 QTC Calculation: 408 R Axis:   54  Text Interpretation: Sinus bradycardia with 1st degree A-V block Inferior infarct , age undetermined Abnormal ECG When compared with ECG of 25-Nov-2023 09:27, No significant change was  found Confirmed by Christian Cole, Lonni [id number]) on 08/19/2024 9:39:59 AM    Pharmacologic MPI (10/13/2021): Low risk study without evidence of ischemia or scar.  LVEF 49%.  Risk Assessment/Calculations:             Physical Exam:   VS:  BP 112/64 (BP Location: Left Arm, Patient Position: Sitting, Cuff Size: Normal)   Pulse (!) 57   Ht 5' 11 (1.803 m)   Wt 178 lb 9.6 oz (81 kg)   SpO2 99%   BMI 24.91 kg/m    Wt Readings from Last 3 Encounters:  08/19/24 178 lb 9.6 oz (81 kg)  07/27/24 185 lb (83.9 kg)  06/01/24 180 lb (81.6 kg)    General:  NAD. Neck: No JVD or HJR. Lungs: Clear to auscultation bilaterally without wheezes or crackles. Heart: Regular rate and rhythm without murmurs, rubs, or gallops. Abdomen: Soft, nontender, nondistended. Extremities: No lower extremity edema.  Pedal pulses are trace bilaterally.  ASSESSMENT AND PLAN: .    Coronary artery disease and hyperlipidemia: No angina reported status post remote CABG.  MPI in 2022 was low risk without evidence of ischemia or scar.  Continue secondary prevention with clopidogrel , ezetimibe , and atorvastatin .  Lipids well-controlled on last check in May (LDL 49, triglycerides 64).  Lower extremity paresthesias and decreased pedal pulses: Christian Cole notes that his toes have felt cold at times and sometimes go numb.  Pedal pulses are diminished on exam today.  Given history of CAD as well as continued  tobacco use, we will obtain ABIs at his convenience.  Smoking cessation encouraged.  Hypertension: Blood pressure well-controlled today in spite of discontinuation of benazepril  due to suspected angioedema.  Defer rechallenging with ACE inhibitor or ARB.  Continue amlodipine  10 mg daily.  First-degree AV block: Significant PR prolongation again noted on EKG today, not significantly changed from last tracing in 11/2023.  No symptoms reported to suggest high-grade AV block.  Continue avoidance of AV nodal blocking  agents.  Obesity: Christian Cole has lost approximately 35 pounds since beginning semaglutide .  He is tolerating this well and hopes to maintain his current weight.  Ongoing management per Dr. Hope and Ms. Gretel.  Obstructive sleep apnea: Christian Cole remains compliant with CPAP.  He is in the process of establishing with a new sleep medicine provider in [place] following Dr. Joesphine retirement.    Dispo: Return to clinic in 1 year, sooner if ABIs are significantly abnormal or other concerns arise in the meantime.  Signed, Lonni Hanson, MD

## 2024-08-19 NOTE — Patient Instructions (Signed)
 Medication Instructions:  A prescription has been sent in for Nitroglycerin.  If you have chest pain that doesn't relieve quickly, place one tablet under your tongue and allow it to dissolve.  If no relief after 5 minutes, you may take another pill.  If no relief after 5 minutes, you may take a 3rd dose but you need to call 911 and report to ER immediately.   *If you need a refill on your cardiac medications before your next appointment, please call your pharmacy*  Lab Work: No labs ordered today    Testing/Procedures: Your physician has requested that you have an ankle brachial index (ABI). During this test an ultrasound and blood pressure cuff are used to evaluate the arteries that supply the arms and legs with blood.  Allow thirty minutes for this exam.  There are no restrictions or special instructions.  This will take place at [address] [facility], [place] [address]  Please note: We ask at that you not bring children with you during ultrasound (echo/ vascular) testing. Due to room size and safety concerns, children are not allowed in the ultrasound rooms during exams. Our front office staff cannot provide observation of children in our lobby area while testing is being conducted. An adult accompanying a patient to their appointment will only be allowed in the ultrasound room at the discretion of the ultrasound technician under special circumstances. We apologize for any inconvenience.   Follow-Up: At [facility], you and your health needs are our priority.  As part of our continuing mission to provide you with exceptional heart care, our providers are all part of one team.  This team includes your primary Cardiologist (physician) and Advanced Practice Providers or APPs (Physician Assistants and Nurse Practitioners) who all work together to provide you with the care you need, when you need it.  Your next appointment:   1 year(s)  Provider:    Lonni Hanson, MD

## 2024-09-01 ENCOUNTER — Ambulatory Visit: Admitting: Oncology

## 2024-09-01 ENCOUNTER — Inpatient Hospital Stay: Attending: Oncology

## 2024-09-01 ENCOUNTER — Ambulatory Visit: Payer: Self-pay | Admitting: Oncology

## 2024-09-01 ENCOUNTER — Encounter

## 2024-09-01 ENCOUNTER — Encounter: Payer: Self-pay | Admitting: Oncology

## 2024-09-01 ENCOUNTER — Inpatient Hospital Stay

## 2024-09-01 ENCOUNTER — Inpatient Hospital Stay (HOSPITAL_BASED_OUTPATIENT_CLINIC_OR_DEPARTMENT_OTHER): Admitting: Oncology

## 2024-09-01 ENCOUNTER — Other Ambulatory Visit

## 2024-09-01 VITALS — BP 136/85 | HR 62 | Resp 18 | Wt 177.0 lb

## 2024-09-01 DIAGNOSIS — F17210 Nicotine dependence, cigarettes, uncomplicated: Secondary | ICD-10-CM | POA: Insufficient documentation

## 2024-09-01 DIAGNOSIS — D751 Secondary polycythemia: Secondary | ICD-10-CM | POA: Insufficient documentation

## 2024-09-01 DIAGNOSIS — Z800 Family history of malignant neoplasm of digestive organs: Secondary | ICD-10-CM | POA: Diagnosis not present

## 2024-09-01 DIAGNOSIS — D696 Thrombocytopenia, unspecified: Secondary | ICD-10-CM | POA: Insufficient documentation

## 2024-09-01 LAB — CBC WITH DIFFERENTIAL (CANCER CENTER ONLY)
Abs Immature Granulocytes: 0.03 K/uL (ref 0.00–0.07)
Basophils Absolute: 0 K/uL (ref 0.0–0.1)
Basophils Relative: 0 %
Eosinophils Absolute: 0.1 K/uL (ref 0.0–0.5)
Eosinophils Relative: 1 %
HCT: 48.9 % (ref 39.0–52.0)
Hemoglobin: 16.9 g/dL (ref 13.0–17.0)
Immature Granulocytes: 0 %
Lymphocytes Relative: 39 %
Lymphs Abs: 2.9 K/uL (ref 0.7–4.0)
MCH: 32.4 pg (ref 26.0–34.0)
MCHC: 34.6 g/dL (ref 30.0–36.0)
MCV: 93.9 fL (ref 80.0–100.0)
Monocytes Absolute: 0.8 K/uL (ref 0.1–1.0)
Monocytes Relative: 11 %
Neutro Abs: 3.5 K/uL (ref 1.7–7.7)
Neutrophils Relative %: 49 %
Platelet Count: 128 K/uL — ABNORMAL LOW (ref 150–400)
RBC: 5.21 MIL/uL (ref 4.22–5.81)
RDW: 13.6 % (ref 11.5–15.5)
WBC Count: 7.3 K/uL (ref 4.0–10.5)
nRBC: 0 % (ref 0.0–0.2)

## 2024-09-01 LAB — VITAMIN B12: Vitamin B-12: 1359 pg/mL — ABNORMAL HIGH (ref 180–914)

## 2024-09-01 NOTE — Progress Notes (Signed)
No Phlebotomy today.

## 2024-09-01 NOTE — Assessment & Plan Note (Signed)
 Thrombocytopenia, likely ITP Previous work-up showed normal folate and vitamin B12 level, negative flowcytometry and monoclonal gammopathy workup. Hepatitis B surface antigen tested negative in 2019.  Hep C negative Lab Results  Component Value Date   PLT 128 (L) 09/01/2024   PLT 127.0 (L) 03/30/2024   PLT 120 (L) 03/02/2024   Continue monitor.

## 2024-09-01 NOTE — Progress Notes (Signed)
 Hematology/Oncology Progress note [phone] [phone]     CHIEF COMPLAINTS/REASON FOR VISIT:  Follow-up for thrombocytopenia and erythrocytosis   ASSESSMENT & PLAN:   Secondary erythrocytosis Secondary erythrocytosis,due to testosterone  replacement therapy. Hematocrit is <50. No need for phlebotomy  Thrombocytopenia Thrombocytopenia, likely ITP Previous work-up showed normal folate and vitamin B12 level, negative flowcytometry and monoclonal gammopathy workup. Hepatitis B surface antigen tested negative in 2019.  Hep C negative Lab Results  Component Value Date   PLT 128 (L) 09/01/2024   PLT 127.0 (L) 03/30/2024   PLT 120 (L) 03/02/2024   Continue monitor.  Orders Placed This Encounter  Procedures   CBC with Differential (Cancer Center Only)    Standing Status:   Future    Expected Date:   03/02/2025    Expiration Date:   05/31/2025   Vitamin B12    Standing Status:   Future    Expected Date:   03/02/2025    Expiration Date:   05/31/2025   Follow up in 6 months All questions were answered. The patient knows to call the clinic with any problems, questions or concerns.  Zelphia Cap, MD, PhD [facility] Hematology Oncology 09/01/2024     HISTORY OF PRESENTING ILLNESS:  Christian Cole is a 73 y.o. male who was seen in consultation at the request of Christian Merle, MD for evaluation of thrombocytopenia and erythrocytosis.    Reviewed patient's labs done previously.  12/21/2020 labs showed decreased platelet counts at 1 39,000. wbc 8.4 hemoglobin 18.3. Reviewed patient's previous labs. Thrombocytopenia is chronic chronic onset , since at least 2015. No aggravating or elevated factors. Erythrocytosis, started after patient was started on testosterone  replacement therapy. He previously never had erythrocytosis sent to them. Associated symptoms or signs:  Denies weight loss, fever, chills, fatigue, night sweats.  Denies hematochezia, hematuria,  hematemesis, epistaxis, black tarry stool.  easy bruising.   Patient drinks 3 beers a day.  Sleep apnea, patient reports being compliant with CPAP machine 07/19/2022, ultrasound kidney showed hypoechoic kidney mass.  Patient follows with urologist and was recommended to observation and follow-up with additional imaging in the future.  INTERVAL HISTORY Christian Cole is a 73 y.o. male who has above history reviewed by me today presents for follow up visit for management of erythrocytosis and thrombocytopenia. Patient has no new complaints.  He is on testosterone  replacement therapy. He is on Semaglutide  injections. Has intentionally lost weight.   Review of Systems  Constitutional:  Negative for chills, fatigue and fever.  HENT:   Negative for hearing loss and voice change.   Eyes:  Negative for eye problems and icterus.  Respiratory:  Negative for chest tightness, cough and shortness of breath.   Cardiovascular:  Negative for chest pain and leg swelling.  Gastrointestinal:  Negative for abdominal distention and abdominal pain.  Endocrine: Negative for hot flashes.  Genitourinary:  Negative for difficulty urinating, dysuria and frequency.   Musculoskeletal:  Negative for arthralgias.  Skin:  Negative for itching and rash.  Neurological:  Negative for light-headedness and numbness.  Hematological:  Negative for adenopathy. Does not bruise/bleed easily.  Psychiatric/Behavioral:  Negative for confusion.     MEDICAL HISTORY:  Past Medical History:  Diagnosis Date   Aortic atherosclerosis    BPH (benign prostatic hyperplasia)    CAD (coronary artery disease)    a. s/p CABG in 2000 with LIMA-LAD, RIMA-PDA, SVG-D1, and SVG-PLA b. low-risk NST in 2016   Carotid artery stenosis    Cyst  of right kidney    ED (erectile dysfunction)    a.) penile implant in place   Fatty liver    GERD (gastroesophageal reflux disease)    Hyperlipidemia    Hypertension    Long term current use of  antithrombotics/antiplatelets    a.) clopidogrel    Low testosterone     a.) on exogenous TRT   OSA on CPAP    Pre-diabetes    S/P CABG x 4 2000   a.) LIMA-LAD, RIMA-PDA, SVG-D1, SVG-PLA   Secondary erythrocytosis    a.) smoking + TRT + OSAH   Sinus bradycardia    Skin cancer    Thrombocytopenia    Tongue lesion    benign from trauma as of 06/20/21    SURGICAL HISTORY: Past Surgical History:  Procedure Laterality Date   CATARACT EXTRACTION, BILATERAL  2010   COLONOSCOPY WITH PROPOFOL  N/A 04/15/2020   Procedure: COLONOSCOPY WITH PROPOFOL ;  Surgeon: Christian Reyes ORN, MD;  Location: [facility] ENDOSCOPY;  Service: Endoscopy;  Laterality: N/A;   CORONARY ARTERY BYPASS GRAFT  2000   LIMA to the LAD,RIMA to the PDA,vein graft to the diagonal & vein graft to the PLA   ESOPHAGOGASTRODUODENOSCOPY     Dr. Gaylyn   ESOPHAGOGASTRODUODENOSCOPY  04/15/2020   Procedure: ESOPHAGOGASTRODUODENOSCOPY (EGD);  Surgeon: Christian Reyes ORN, MD;  Location: [facility] ENDOSCOPY;  Service: Endoscopy;;   HOLEP-LASER ENUCLEATION OF THE PROSTATE WITH MORCELLATION N/A 11/27/2021   Procedure: HOLEP-LASER ENUCLEATION OF THE PROSTATE WITH MORCELLATION;  Surgeon: Christian Knee, MD;  Location: [facility] ORS;  Service: Urology;  Laterality: N/A;   INGUINAL HERNIA REPAIR Left 01/11/2022   Procedure: HERNIA REPAIR INGUINAL ADULT;  Surgeon: Christian Schanz, MD;  Location: [facility] ORS;  Service: General;  Laterality: Left;   Cole ARTHROSCOPY Right 1991   Cole DEBRIDEMENT Right 1989   Fluid flush   PENILE PROSTHESIS IMPLANT     06/26/19 Dr. Levonia   SHOULDER ARTHROSCOPY Left 2011   UMBILICAL HERNIA REPAIR N/A 01/11/2022   Procedure: HERNIA REPAIR UMBILICAL ADULT;  Surgeon: Christian Schanz, MD;  Location: [facility] ORS;  Service: General;  Laterality: N/A;   US  ECHOCARDIOGRAPHY  12/12/2010   mild LA dilatation, normal LV systolic fx, EF > 55%   XI ROBOTIC ASSISTED INGUINAL HERNIA REPAIR WITH MESH Right 01/11/2022   Procedure: XI ROBOTIC ASSISTED  INGUINAL HERNIA REPAIR WITH MESH;  Surgeon: Christian Schanz, MD;  Location: [facility] ORS;  Service: General;  Laterality: Right;    SOCIAL HISTORY: Social History   Socioeconomic History   Marital status: Married    Spouse name: Not on file   Number of children: 3   Years of education: 16   Highest education level: Bachelor's degree (e.g., BA, AB, BS)  Occupational History   Occupation: Retired Electronics engineer   Occupation: Management/Technical Support    Comment: Educational psychologist  Tobacco Use   Smoking status: Every Day    Current packs/day: 0.50    Average packs/day: 0.5 packs/day for 44.0 years (22.0 ttl pk-yrs)    Types: Cigarettes   Smokeless tobacco: Never   Tobacco comments:    on and off smoker   Vaping Use   Vaping status: Never Used  Substance and Sexual Activity   Alcohol use: Yes    Alcohol/week: 10.0 standard drinks of alcohol    Types: 10 Cans of beer per week    Comment: Down to 3 beers a day from 4-6 a day    Drug use: No   Sexual activity: Yes  Partners: Female    Comment: Wife  Other Topics Concern   Not on file  Social History Narrative   [place] grew up in [place] . He is currently living in [place] with his wife. This is his second marriage. He has 1 daughter and 2 sons from his first marriage. He has 4 step kids. His daughter lives in [place] , one son in [place], [place] and the other son in [place], [place]. 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       He worked Designer, industrial/product for a [employer]    He enjoys wood working on his spare time. 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 He also enjoys shooting sports - target shooting and [employer] Herbalist).       He retired 04/2019 and enjoys this       2 brothers and 2 sisters he is next to youngest    Social Drivers of Corporate investment banker Strain: Low Risk  (09/26/2023)   Overall Financial Resource Strain (CARDIA)    Difficulty of Paying Living Expenses: Not hard at all   Food Insecurity: No Food Insecurity (10/17/2023)   Hunger Vital Sign    Worried About Running Out of Food in the Last Year: Never true    Ran Out of Food in the Last Year: Never true  Transportation Needs: No Transportation Needs (10/17/2023)   PRAPARE - Administrator, Civil Service (Medical): No    Lack of Transportation (Non-Medical): No  Physical Activity: Insufficiently Active (09/26/2023)   Exercise Vital Sign    Days of Exercise per Week: 6 days    Minutes of Exercise per Session: 20 min  Stress: No Stress Concern Present (09/26/2023)   [employer] of Occupational Health - Occupational Stress Questionnaire    Feeling of Stress : Not at all  Social Connections: Moderately Isolated (09/26/2023)   Social Connection and Isolation Panel    Frequency of Communication with Friends and Family: More than three times a week    Frequency of Social Gatherings with Friends and Family: Once a week    Attends Religious Services: Never    Database administrator or Organizations: No    Attends Engineer, structural: Not on file    Marital Status: Married  Catering manager Violence: Not At Risk (10/17/2023)   Humiliation, Afraid, Rape, and Kick questionnaire    Fear of Current or Ex-Partner: No    Emotionally Abused: No    Physically Abused: No    Sexually Abused: No    FAMILY HISTORY: Family History  Problem Relation Age of Onset   Arthritis Mother    Heart disease Father        CAD - MI   Stroke Father    Hypertension Father    Cancer Father 7       esophageal cancer    Arthritis Maternal Grandmother     ALLERGIES:  is allergic to benazepril .  MEDICATIONS:  Current Outpatient Medications  Medication Sig Dispense Refill   amLODipine  (NORVASC ) 10 MG tablet Take 1 tablet (10 mg total) by mouth every evening. 90 tablet 3   atorvastatin  (LIPITOR) 40 MG tablet TAKE 1 TABLET(40 MG) BY MOUTH DAILY AT NIGHT 90 tablet 3   B-D 3CC LUER-LOK SYR 23GX1-1/2 23G X  1-1/2 3 ML MISC Use once every 14 days 50 each 5   clopidogrel  (PLAVIX ) 75 MG tablet Take 1 tablet (75 mg total) by  mouth every evening. 90 tablet 3   ezetimibe  (ZETIA ) 10 MG tablet TAKE 1 TABLET(10 MG) BY MOUTH DAILY 90 tablet 3   ibuprofen (ADVIL) 200 MG tablet Take 400 mg by mouth every 8 (eight) hours as needed (pain.).     Multiple Vitamins-Minerals (MULTIVITAMIN WITH MINERALS) tablet Take 1 tablet by mouth every evening. Men 50+     NEEDLE, DISP, 18 G (B-D HYPODERMIC NEEDLE 18GX1.5) 18G X 1-1/2 MISC DRAW UP MEDICATION WITH THIS NEEDLE AS DIRECTED 30 each 0   pantoprazole  (PROTONIX ) 40 MG tablet Take 1 tablet (40 mg total) by mouth every evening. TAKE 1 TABLET BY MOUTH EVERY DAY 30 MINUTES BEFORE A MEAL 90 tablet 3   Semaglutide -Weight Management 2.4 MG/0.75ML SOAJ Inject 2.4 mg into the skin once a week. 9 mL 3   testosterone  cypionate (DEPOTESTOSTERONE CYPIONATE) 200 MG/ML injection ADMINISTER 1.5 ML(300 MG) IN THE MUSCLE EVERY 14 DAYS 10 mL 0   nitroGLYCERIN (NITROSTAT) 0.4 MG SL tablet Place 1 tablet (0.4 mg total) under the tongue every 5 (five) minutes as needed for chest pain. (Patient not taking: Reported on 09/01/2024) 25 tablet 3   No current facility-administered medications for this visit.     PHYSICAL EXAMINATION: ECOG PERFORMANCE STATUS: 0 - Asymptomatic Vitals:   09/01/24 1405  BP: 136/85  Pulse: 62  Resp: 18  SpO2: 99%   Filed Weights   09/01/24 1405  Weight: 177 lb (80.3 kg)    Physical Exam Constitutional:      General: He is not in acute distress. HENT:     Head: Normocephalic and atraumatic.  Eyes:     General: No scleral icterus. Cardiovascular:     Rate and Rhythm: Normal rate and regular rhythm.     Heart sounds: Normal heart sounds.  Pulmonary:     Effort: Pulmonary effort is normal. No respiratory distress.     Breath sounds: No wheezing.  Abdominal:     General: Bowel sounds are normal. There is no distension.     Palpations: Abdomen is  soft.  Musculoskeletal:        General: No deformity. Normal range of motion.     Cervical back: Normal range of motion and neck supple.  Skin:    General: Skin is warm and dry.     Findings: No erythema or rash.  Neurological:     Mental Status: He is alert and oriented to person, place, and time. Mental status is at baseline.     Cranial Nerves: No cranial nerve deficit.     Coordination: Coordination normal.  Psychiatric:        Mood and Affect: Mood normal.      LABORATORY DATA:  I have reviewed the data as listed Lab Results  Component Value Date   WBC 7.3 09/01/2024   HGB 16.9 09/01/2024   HCT 48.9 09/01/2024   MCV 93.9 09/01/2024   PLT 128 (L) 09/01/2024   Recent Labs    09/30/23 1014 03/30/24 1224  NA 140 136  K 4.5 4.2  CL 104 101  CO2 27 26  GLUCOSE 104* 92  BUN 15 15  CREATININE 1.13 1.12  CALCIUM  9.4 9.7  PROT 6.8 7.2  ALBUMIN 4.5 4.7  AST 22 24  ALT 20 25  ALKPHOS 80 76  BILITOT 0.8 0.9   Iron/TIBC/Ferritin/ %Sat No results found for: IRON, TIBC, FERRITIN, IRONPCTSAT    RADIOGRAPHIC STUDIES: I have personally reviewed the radiological images as listed and agreed with the  findings in the report.  No results found.

## 2024-09-01 NOTE — Assessment & Plan Note (Addendum)
 Secondary erythrocytosis,due to testosterone  replacement therapy. Hematocrit is <50. No need for phlebotomy

## 2024-09-03 ENCOUNTER — Other Ambulatory Visit: Payer: Self-pay | Admitting: Urology

## 2024-09-11 DIAGNOSIS — D2339 Other benign neoplasm of skin of other parts of face: Secondary | ICD-10-CM | POA: Diagnosis not present

## 2024-09-11 DIAGNOSIS — D225 Melanocytic nevi of trunk: Secondary | ICD-10-CM | POA: Diagnosis not present

## 2024-09-11 DIAGNOSIS — D485 Neoplasm of uncertain behavior of skin: Secondary | ICD-10-CM | POA: Diagnosis not present

## 2024-09-11 DIAGNOSIS — L570 Actinic keratosis: Secondary | ICD-10-CM | POA: Diagnosis not present

## 2024-09-11 DIAGNOSIS — L859 Epidermal thickening, unspecified: Secondary | ICD-10-CM | POA: Diagnosis not present

## 2024-09-11 DIAGNOSIS — D2261 Melanocytic nevi of right upper limb, including shoulder: Secondary | ICD-10-CM | POA: Diagnosis not present

## 2024-09-11 DIAGNOSIS — D2272 Melanocytic nevi of left lower limb, including hip: Secondary | ICD-10-CM | POA: Diagnosis not present

## 2024-09-11 DIAGNOSIS — D2262 Melanocytic nevi of left upper limb, including shoulder: Secondary | ICD-10-CM | POA: Diagnosis not present

## 2024-09-11 DIAGNOSIS — Z85828 Personal history of other malignant neoplasm of skin: Secondary | ICD-10-CM | POA: Diagnosis not present

## 2024-09-28 ENCOUNTER — Other Ambulatory Visit: Payer: Self-pay | Admitting: Medical Genetics

## 2024-10-02 ENCOUNTER — Ambulatory Visit: Admitting: Nurse Practitioner

## 2024-10-02 ENCOUNTER — Encounter: Payer: Self-pay | Admitting: Nurse Practitioner

## 2024-10-02 VITALS — BP 126/66 | HR 58 | Temp 97.6°F | Ht 71.0 in | Wt 173.4 lb

## 2024-10-02 DIAGNOSIS — I2510 Atherosclerotic heart disease of native coronary artery without angina pectoris: Secondary | ICD-10-CM | POA: Diagnosis not present

## 2024-10-02 DIAGNOSIS — E669 Obesity, unspecified: Secondary | ICD-10-CM | POA: Diagnosis not present

## 2024-10-02 DIAGNOSIS — Z1329 Encounter for screening for other suspected endocrine disorder: Secondary | ICD-10-CM

## 2024-10-02 DIAGNOSIS — K862 Cyst of pancreas: Secondary | ICD-10-CM | POA: Diagnosis not present

## 2024-10-02 DIAGNOSIS — E785 Hyperlipidemia, unspecified: Secondary | ICD-10-CM

## 2024-10-02 DIAGNOSIS — I10 Essential (primary) hypertension: Secondary | ICD-10-CM

## 2024-10-02 DIAGNOSIS — M545 Low back pain, unspecified: Secondary | ICD-10-CM | POA: Diagnosis not present

## 2024-10-02 DIAGNOSIS — G4733 Obstructive sleep apnea (adult) (pediatric): Secondary | ICD-10-CM | POA: Diagnosis not present

## 2024-10-02 DIAGNOSIS — R7303 Prediabetes: Secondary | ICD-10-CM

## 2024-10-02 DIAGNOSIS — D751 Secondary polycythemia: Secondary | ICD-10-CM

## 2024-10-02 LAB — COMPREHENSIVE METABOLIC PANEL WITH GFR
ALT: 25 U/L (ref 0–53)
AST: 22 U/L (ref 0–37)
Albumin: 4.6 g/dL (ref 3.5–5.2)
Alkaline Phosphatase: 81 U/L (ref 39–117)
BUN: 12 mg/dL (ref 6–23)
CO2: 28 meq/L (ref 19–32)
Calcium: 9.4 mg/dL (ref 8.4–10.5)
Chloride: 101 meq/L (ref 96–112)
Creatinine, Ser: 1.2 mg/dL (ref 0.40–1.50)
GFR: 59.51 mL/min — ABNORMAL LOW (ref >60.00)
Glucose, Bld: 84 mg/dL (ref 70–99)
Potassium: 4.3 meq/L (ref 3.5–5.1)
Sodium: 138 meq/L (ref 135–145)
Total Bilirubin: 1.5 mg/dL — ABNORMAL HIGH (ref 0.2–1.2)
Total Protein: 6.5 g/dL (ref 6.0–8.3)

## 2024-10-02 LAB — LIPID PANEL
Cholesterol: 96 mg/dL (ref 0–200)
HDL: 53.1 mg/dL (ref >39.00)
LDL Cholesterol: 29 mg/dL (ref 0–99)
NonHDL: 43
Total CHOL/HDL Ratio: 2
Triglycerides: 70 mg/dL (ref 0.0–149.0)
VLDL: 14 mg/dL (ref 0.0–40.0)

## 2024-10-02 LAB — HEMOGLOBIN A1C: Hgb A1c MFr Bld: 5.2 % (ref 4.6–6.5)

## 2024-10-02 LAB — TSH: TSH: 1.72 u[IU]/mL (ref 0.35–5.50)

## 2024-10-02 MED ORDER — METHOCARBAMOL 500 MG PO TABS: 500.0000 mg | ORAL_TABLET | Freq: Three times a day (TID) | ORAL | 0 refills | Status: AC | PRN

## 2024-10-02 NOTE — Progress Notes (Signed)
 Christian Glance, NP-C Phone: [phone]  Christian Cole is a 73 y.o. male who presents today for transfer of care.   Discussed the use of AI scribe software for clinical note transcription with the patient, who gave verbal consent to proceed.  History of Present Illness   Christian Cole is a 73 year old male who presents for transfer of care with back pain and sleep disturbances.  He experiences sharp, stabbing back pain that occurs with certain movements such as turning, twisting, bending over, or standing incorrectly. The pain is intermittent and not associated with urination issues, blood in urine, or any kidney-related symptoms. He believes the pain is due to muscle weakness from weight loss and decreased core strength related to using Wegovy . Ibuprofen provides some relief, but he is concerned about its long-term use due to potential stomach and kidney issues.  He has been on Wegovy  for weight loss and has lost about 40 pounds. He is currently on the highest dose of 2.4 mg weekly. Due to insurance issues, he switched to a compounded version of the medication. His diet includes oatmeal, toast, protein bars, Greek yogurt, cottage cheese, fruit, lean meat, and vegetables. He walks a mile daily on a treadmill at a moderate pace and plans to increase his exercise duration.  He has been experiencing difficulty falling asleep over the past two months, taking about an hour to an hour and a half to fall asleep. He uses a CPAP machine nightly for sleep apnea, which he has been using for ten years. He averages six episodes per night and gets just under seven hours of sleep. He has tried Benadryl to aid sleep but suspects he may have developed a tolerance. He has not tried melatonin.  He is on testosterone  therapy and has experienced increased hemoglobin levels, which are monitored by a hematologist every six months. He has not required phlebotomies recently. He uses 18-gauge needles to draw up the  testosterone  and 23-gauge needles for injection, preferring a 1-inch needle over the 1.5-inch specified in his records.  He has a history of a pancreatic lesion discovered over two years ago during a scan for another issue. He was advised to have a follow-up scan after two years, which is now due.  He reports cold and numb feet and is scheduled for an ABI test next week to investigate this symptom.  He takes Lipitor and Zetia  for cholesterol and amlodipine  for blood pressure, with no reported issues. He previously took benazepril  but discontinued it due to a reaction. His home blood pressure readings are typically around 120-130/60s. No chest pain, shortness of breath, dizziness, or leg swelling.      Social History   Tobacco Use  Smoking Status Every Day   Current packs/day: 0.50   Average packs/day: 0.5 packs/day for 44.0 years (22.0 ttl pk-yrs)   Types: Cigarettes  Smokeless Tobacco Never  Tobacco Comments   on and off smoker     Current Outpatient Medications on File Prior to Visit  Medication Sig Dispense Refill   amLODipine  (NORVASC ) 10 MG tablet Take 1 tablet (10 mg total) by mouth every evening. 90 tablet 3   atorvastatin  (LIPITOR) 40 MG tablet TAKE 1 TABLET(40 MG) BY MOUTH DAILY AT NIGHT 90 tablet 3   B-D 3CC LUER-LOK SYR 23GX1-1/2 23G X 1-1/2 3 ML MISC USE AS DIRECTED EVERY 14 DAYS 50 each 5   clopidogrel  (PLAVIX ) 75 MG tablet Take 1 tablet (75 mg total) by mouth every evening. 90  tablet 3   ezetimibe  (ZETIA ) 10 MG tablet TAKE 1 TABLET(10 MG) BY MOUTH DAILY 90 tablet 3   ibuprofen (ADVIL) 200 MG tablet Take 400 mg by mouth every 8 (eight) hours as needed (pain.).     Multiple Vitamins-Minerals (MULTIVITAMIN WITH MINERALS) tablet Take 1 tablet by mouth every evening. Men 50+     NEEDLE, DISP, 18 G (B-D HYPODERMIC NEEDLE 18GX1.5) 18G X 1-1/2 MISC DRAW UP MEDICATION WITH THIS NEEDLE AS DIRECTED 30 each 0   nitroGLYCERIN  (NITROSTAT ) 0.4 MG SL tablet Place 1 tablet (0.4 mg  total) under the tongue every 5 (five) minutes as needed for chest pain. 25 tablet 3   pantoprazole  (PROTONIX ) 40 MG tablet Take 1 tablet (40 mg total) by mouth every evening. TAKE 1 TABLET BY MOUTH EVERY DAY 30 MINUTES BEFORE A MEAL 90 tablet 3   Semaglutide -Weight Management 2.4 MG/0.75ML SOAJ Inject 2.4 mg into the skin once a week. 9 mL 3   testosterone  cypionate (DEPOTESTOSTERONE CYPIONATE) 200 MG/ML injection ADMINISTER 1.5 ML(300 MG) IN THE MUSCLE EVERY 14 DAYS 10 mL 0   No current facility-administered medications on file prior to visit.     ROS see history of present illness  Objective  Physical Exam Vitals:   10/02/24 0950  BP: 126/66  Pulse: (!) 58  Temp: 97.6 F (36.4 C)  SpO2: 99%    BP Readings from Last 3 Encounters:  10/04/24 110/76  10/02/24 126/66  09/01/24 136/85   Wt Readings from Last 3 Encounters:  10/04/24 174 lb 2.6 oz (79 kg)  10/02/24 173 lb 6.4 oz (78.7 kg)  09/01/24 177 lb (80.3 kg)    Physical Exam Constitutional:      General: He is not in acute distress.    Appearance: Normal appearance.  HENT:     Head: Normocephalic.  Cardiovascular:     Rate and Rhythm: Normal rate and regular rhythm.     Heart sounds: Normal heart sounds.  Pulmonary:     Effort: Pulmonary effort is normal.     Breath sounds: Normal breath sounds.  Musculoskeletal:     Lumbar back: Spasms present. No tenderness. Normal range of motion.  Skin:    General: Skin is warm and dry.  Neurological:     General: No focal deficit present.     Mental Status: He is alert.  Psychiatric:        Mood and Affect: Mood normal.        Behavior: Behavior normal.      Assessment/Plan: Please see individual problem list.  Acute bilateral low back pain without sciatica Assessment & Plan: Intermittent sharp stabbing low back pain believed to be due to loss of core strength from weight loss and Wegovy  use. Denies any urinary symptoms. He would like to trial a muscle relaxer to  see if it helps with symptoms. Start Robaxin  every 8 hours as needed. Return precautions given to patient.   Orders: -     Methocarbamol ; Take 1 tablet (500 mg total) by mouth every 8 (eight) hours as needed for muscle spasms.  Dispense: 45 tablet; Refill: 0  Obesity with serious comorbidity, unspecified class, unspecified obesity type Assessment & Plan: Significant weight loss achieved with Wegovy , currently at target weight. Consider dose reduction to 1.7 mg due to potential side effects. Continue current regimen, monitor weight, and adjust diet and exercise as necessary.   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 No recent phlebotomies  needed. Continue regular follow-up with hematology.   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 Ordered follow-up imaging to assess lesion.   Orders: -     MR ABDOMEN MRCP W WO CONTAST; Future  OSA on CPAP Assessment & Plan: Good adherence to CPAP. Recent difficulty falling asleep. Recommend trial of melatonin two hours before bedtime. Continue nightly CPAP use.    Primary hypertension Assessment & Plan: Blood pressure well-controlled with amlodipine . Continue current management.  Orders: -     Comprehensive metabolic panel with GFR  Hyperlipidemia, unspecified hyperlipidemia type Assessment & Plan: Managed with Lipitor and Zetia . Continue Lipitor and Zetia  for cholesterol management.  Orders: -     Lipid panel  Prediabetes Assessment & Plan: Dietary management in place with focus on low glycemic index foods. Continue current dietary management.   Orders: -     Hemoglobin A1c  Coronary artery disease involving native coronary artery of native heart without angina pectoris Assessment & Plan: Continue Plavix  daily.    Thyroid  disorder screen -     TSH      Return in about 6 months (around  04/01/2025) for Follow up.   Christian Glance, NP-C [facility]

## 2024-10-04 ENCOUNTER — Emergency Department

## 2024-10-04 ENCOUNTER — Emergency Department: Admission: EM | Admit: 2024-10-04 | Discharge: 2024-10-04 | Disposition: A | Discharge status: Home / Self Care

## 2024-10-04 ENCOUNTER — Other Ambulatory Visit: Payer: Self-pay

## 2024-10-04 DIAGNOSIS — R109 Unspecified abdominal pain: Secondary | ICD-10-CM | POA: Diagnosis present

## 2024-10-04 DIAGNOSIS — I2510 Atherosclerotic heart disease of native coronary artery without angina pectoris: Secondary | ICD-10-CM | POA: Diagnosis not present

## 2024-10-04 DIAGNOSIS — M47816 Spondylosis without myelopathy or radiculopathy, lumbar region: Secondary | ICD-10-CM | POA: Diagnosis not present

## 2024-10-04 DIAGNOSIS — I10 Essential (primary) hypertension: Secondary | ICD-10-CM | POA: Insufficient documentation

## 2024-10-04 DIAGNOSIS — Z955 Presence of coronary angioplasty implant and graft: Secondary | ICD-10-CM | POA: Insufficient documentation

## 2024-10-04 DIAGNOSIS — N12 Tubulo-interstitial nephritis, not specified as acute or chronic: Secondary | ICD-10-CM | POA: Insufficient documentation

## 2024-10-04 DIAGNOSIS — M47817 Spondylosis without myelopathy or radiculopathy, lumbosacral region: Secondary | ICD-10-CM | POA: Diagnosis not present

## 2024-10-04 DIAGNOSIS — M51379 Other intervertebral disc degeneration, lumbosacral region without mention of lumbar back pain or lower extremity pain: Secondary | ICD-10-CM | POA: Diagnosis not present

## 2024-10-04 DIAGNOSIS — N281 Cyst of kidney, acquired: Secondary | ICD-10-CM | POA: Diagnosis not present

## 2024-10-04 LAB — HEPATIC FUNCTION PANEL
ALT: 29 U/L (ref 0–44)
AST: 25 U/L (ref 15–41)
Albumin: 4.3 g/dL (ref 3.5–5.0)
Alkaline Phosphatase: 86 U/L (ref 38–126)
Bilirubin, Direct: 0.3 mg/dL — ABNORMAL HIGH (ref 0.0–0.2)
Indirect Bilirubin: 0.3 mg/dL (ref 0.3–0.9)
Total Bilirubin: 0.6 mg/dL (ref 0.0–1.2)
Total Protein: 6.4 g/dL — ABNORMAL LOW (ref 6.5–8.1)

## 2024-10-04 LAB — URINALYSIS, ROUTINE W REFLEX MICROSCOPIC
Bilirubin Urine: NEGATIVE
Glucose, UA: NEGATIVE mg/dL
Hgb urine dipstick: NEGATIVE
Ketones, ur: NEGATIVE mg/dL
Nitrite: NEGATIVE
Protein, ur: NEGATIVE mg/dL
Specific Gravity, Urine: 1.018 (ref 1.005–1.030)
Squamous Epithelial / HPF: 0 /HPF (ref 0–5)
WBC, UA: >50 WBC/hpf (ref 0–5)
pH: 6 (ref 5.0–8.0)

## 2024-10-04 LAB — BASIC METABOLIC PANEL WITH GFR
Anion gap: 12 (ref 5–15)
BUN: 17 mg/dL (ref 8–23)
CO2: 24 mmol/L (ref 22–32)
Calcium: 9.1 mg/dL (ref 8.9–10.3)
Chloride: 101 mmol/L (ref 98–111)
Creatinine, Ser: 1.02 mg/dL (ref 0.61–1.24)
GFR, Estimated: >60 mL/min (ref >60)
Glucose, Bld: 132 mg/dL — ABNORMAL HIGH (ref 70–99)
Potassium: 4.2 mmol/L (ref 3.5–5.1)
Sodium: 137 mmol/L (ref 135–145)

## 2024-10-04 LAB — URINALYSIS, COMPLETE (UACMP) WITH MICROSCOPIC
Bilirubin Urine: NEGATIVE
Glucose, UA: NEGATIVE mg/dL
Hgb urine dipstick: NEGATIVE
Ketones, ur: NEGATIVE mg/dL
Nitrite: NEGATIVE
Protein, ur: NEGATIVE mg/dL
Specific Gravity, Urine: 1.019 (ref 1.005–1.030)
Squamous Epithelial / HPF: 0 /HPF (ref 0–5)
WBC, UA: >50 WBC/hpf (ref 0–5)
pH: 6 (ref 5.0–8.0)

## 2024-10-04 LAB — CBC
HCT: 48.5 % (ref 39.0–52.0)
Hemoglobin: 17.1 g/dL — ABNORMAL HIGH (ref 13.0–17.0)
MCH: 32.6 pg (ref 26.0–34.0)
MCHC: 35.3 g/dL (ref 30.0–36.0)
MCV: 92.6 fL (ref 80.0–100.0)
Platelets: 143 K/uL — ABNORMAL LOW (ref 150–400)
RBC: 5.24 MIL/uL (ref 4.22–5.81)
RDW: 13.4 % (ref 11.5–15.5)
WBC: 7.5 K/uL (ref 4.0–10.5)
nRBC: 0 % (ref 0.0–0.2)

## 2024-10-04 LAB — LIPASE, BLOOD: Lipase: 86 U/L — ABNORMAL HIGH (ref 11–51)

## 2024-10-04 LAB — LACTIC ACID, PLASMA: Lactic Acid, Venous: 0.7 mmol/L (ref 0.5–1.9)

## 2024-10-04 MED ORDER — SODIUM CHLORIDE 0.9 % IV BOLUS
1000.0000 mL | Freq: Once | INTRAVENOUS | Status: AC
  Administered 2024-10-04: 1000 mL via INTRAVENOUS

## 2024-10-04 MED ORDER — SODIUM CHLORIDE 0.9 % IV SOLN
3.0000 g | Freq: Once | INTRAVENOUS | Status: AC
  Administered 2024-10-04: 3 g via INTRAVENOUS
  Filled 2024-10-04: qty 8

## 2024-10-04 MED ORDER — AMOXICILLIN-POT CLAVULANATE 875-125 MG PO TABS: 1.0000 | ORAL_TABLET | Freq: Two times a day (BID) | ORAL | 0 refills | Status: AC

## 2024-10-04 MED ORDER — IOHEXOL 300 MG/ML  SOLN
100.0000 mL | Freq: Once | INTRAMUSCULAR | Status: AC | PRN
  Administered 2024-10-04: 100 mL via INTRAVENOUS

## 2024-10-04 MED ORDER — OXYCODONE-ACETAMINOPHEN 5-325 MG PO TABS
1.0000 | ORAL_TABLET | ORAL | 0 refills | Status: AC | PRN
Start: 2024-10-04 — End: 2024-10-09

## 2024-10-04 MED ORDER — ONDANSETRON 4 MG PO TBDP: 4.0000 mg | ORAL_TABLET | Freq: Three times a day (TID) | ORAL | 0 refills | Status: AC | PRN

## 2024-10-04 MED ORDER — ONDANSETRON HCL 4 MG/2ML IJ SOLN
4.0000 mg | Freq: Once | INTRAMUSCULAR | Status: AC
  Administered 2024-10-04: 4 mg via INTRAVENOUS
  Filled 2024-10-04: qty 2

## 2024-10-04 MED ORDER — HYDROMORPHONE HCL 1 MG/ML IJ SOLN
0.5000 mg | Freq: Once | INTRAMUSCULAR | Status: AC
  Administered 2024-10-04: 0.5 mg via INTRAVENOUS
  Filled 2024-10-04: qty 0.5

## 2024-10-04 MED ORDER — OXYCODONE-ACETAMINOPHEN 5-325 MG PO TABS
1.0000 | ORAL_TABLET | Freq: Once | ORAL | Status: AC
  Administered 2024-10-04: 1 via ORAL
  Filled 2024-10-04: qty 1

## 2024-10-04 NOTE — ED Triage Notes (Signed)
 Pt c/o bilateral flank pain with the right side worsening and radiating around side to abdomen. Pt saw pcp and was given muscle relaxers. Pt reports the muscle relaxers have helped up until tonight. Pt also took tramadol without relief. Pt reports pushing extra water to stay hydrated but says he thinks his urine is starting to appear cloudy today.

## 2024-10-04 NOTE — ED Notes (Signed)
 Pt discharged to home, instructions and medications reviewed.  Pt verbalized understanding, voices no questions at this time.

## 2024-10-04 NOTE — Discharge Instructions (Addendum)
 Ensure adequate hydration, take the full course of antibiotics (your next dose will be due tomorrow morning).  Be wary that Augmentin  can cause some diarrhea but should not cause bloody diarrhea or progressively worsening abdominal pain.  If this happens please return as this may be a sign of C. difficile.  Do not drive or operate machinery while taking your Percocet.  Return if any acutely worsening symptoms or any other emergency. -- RETURN PRECAUTIONS & AFTERCARE: (ENGLISH) RETURN PRECAUTIONS: Return immediately to the emergency department or see/call your doctor if you feel worse, weak or have changes in speech or vision, are short of breath, have fever, vomiting, pain, bleeding or dark stool, trouble urinating or any new issues. Return here or see/call your doctor if not improving as expected for your suspected condition. FOLLOW-UP CARE: Call your doctor and/or any doctors we referred you to for more advice and to make an appointment. Do this today, tomorrow or after the weekend. Some doctors only take PPO insurance so if you have HMO insurance you may want to contact your HMO or your regular doctor for referral to a specialist within your plan. Either way tell the doctor's office that it was a referral from the emergency department so you get the soonest possible appointment.  YOUR TEST RESULTS: Take result reports of any blood or urine tests, imaging tests and EKG's to your doctor and any referral doctor. Have any abnormal tests repeated. Your doctor or a referral doctor can let you know when this should be done. Also make sure your doctor contacts this hospital to get any test results that are not currently available such as cultures or special tests for infection and final imaging reports, which are often not available at the time you leave the ER but which may list additional important findings that are not documented on the preliminary report. BLOOD PRESSURE: If your blood pressure was greater than  120/80 have your blood pressure rechecked within 1 to 2 weeks. MEDICATION SIDE EFFECTS: Do not drive, walk, bike, take the bus, etc. if you have received or are being prescribed any sedating medications such as those for pain or anxiety or certain antihistamines like Benadryl. If you have been give one of these here get a taxi home or have a friend drive you home. Ask your pharmacist to counsel you on potential side effects of any new medication

## 2024-10-04 NOTE — ED Provider Notes (Signed)
 [facility] Provider Note    Event Date/Time   First MD Initiated Contact with Patient 10/04/24 1930     (approximate)   History   Flank Pain   HPI  Christian Cole is a 73 y.o. male  secondary erythrocytosis,due to testosterone  replacement therapy, CAD status post CABG, OSA, hypertension, hyperlipidemia, GERD, ED with penile implant, BPH status post prostate laser removal who presents with 4 days of progressively worsening right flank pain and right suprapubic pain.  He has noticed cloudy urine but denies any dysuria.  Denies any penile or scrotal abnormalities.  He has nausea without emesis.  He saw his primary care physician 3 days ago and was prescribed methocarbamol  which he has taken without relief.  He did take his wife's tramadol today which did not help.  He denies any headache, hearing or vision changes, chest pain shortness of breath or any swelling in his lower extremities.  He denies any extremity weakness saddle anesthesia, urinary retention or incontinence or sensation changes.  He denies any prior history of IV drug use abscess.  He presents with his wife who contributes to the history      Physical Exam   Triage Vital Signs: ED Triage Vitals  Encounter Vitals Group     BP 10/04/24 1842 (!) 162/82     Girls Systolic BP Percentile --      Girls Diastolic BP Percentile --      Boys Systolic BP Percentile --      Boys Diastolic BP Percentile --      Pulse Rate 10/04/24 1842 69     Resp 10/04/24 1842 20     Temp 10/04/24 1842 98 F (36.7 C)     Temp Source 10/04/24 1842 Oral     SpO2 10/04/24 1842 100 %     Weight 10/04/24 1841 174 lb 2.6 oz (79 kg)     Height 10/04/24 1841 5' 11 (1.803 m)     Head Circumference --      Peak Flow --      Pain Score 10/04/24 1841 9     Pain Loc --      Pain Education --      Exclude from Growth Chart --     Most recent vital signs: Vitals:   10/04/24 1842 10/04/24 2157  BP: (!) 162/82 110/76   Pulse: 69 (!) 54  Resp: 20 18  Temp: 98 F (36.7 C) 98 F (36.7 C)  SpO2: 100% 96%    Nursing Triage Note reviewed. Vital signs reviewed and patients oxygen saturation is normoxic  General: Patient is well nourished, well developed, awake and alert, resting comfortably in no acute distress Head: Normocephalic and atraumatic Eyes: Normal inspection, extraocular muscles intact, no conjunctival pallor Ear, nose, throat: Normal external exam Neck: Normal range of motion Respiratory: Patient is in no respiratory distress, lungs CTAB Cardiovascular: Patient is not tachycardic, RRR without murmur appreciated GI: Abd soft, mild tenderness to palpation in the right lower quadrant with no rebound or guarding Back: Normal inspection of the back with good strength and range of motion throughout all ext No T or L-spine tenderness to palpation.  Mild right CVA tenderness to palpation no left CVA tenderness to palpation Extremities: pulses intact with good cap refills, no LE pitting edema or calf tenderness Neuro: The patient is alert and oriented to person, place, and time, appropriately conversive, with 5/5 bilat UE/LE strength, no gross motor or sensory defects noted. Coordination  appears to be adequate. Skin: Warm, dry, and intact Psych: normal mood and affect, no SI or HI  ED Results / Procedures / Treatments   Labs (all labs ordered are listed, but only abnormal results are displayed) Labs Reviewed  URINALYSIS, ROUTINE W REFLEX MICROSCOPIC - Abnormal; Notable for the following components:      Result Value   Color, Urine YELLOW (*)    APPearance HAZY (*)    Leukocytes,Ua LARGE (*)    Bacteria, UA MANY (*)    All other components within normal limits  BASIC METABOLIC PANEL WITH GFR - Abnormal; Notable for the following components:   Glucose, Bld 132 (*)    All other components within normal limits  CBC - Abnormal; Notable for the following components:   Hemoglobin 17.1 (*)     Platelets 143 (*)    All other components within normal limits  LIPASE, BLOOD - Abnormal; Notable for the following components:   Lipase 86 (*)    All other components within normal limits  HEPATIC FUNCTION PANEL - Abnormal; Notable for the following components:   Total Protein 6.4 (*)    Bilirubin, Direct 0.3 (*)    All other components within normal limits  URINALYSIS, COMPLETE (UACMP) WITH MICROSCOPIC - Abnormal; Notable for the following components:   Color, Urine YELLOW (*)    APPearance HAZY (*)    Leukocytes,Ua MODERATE (*)    Bacteria, UA MANY (*)    All other components within normal limits  URINE CULTURE  CULTURE, BLOOD (ROUTINE X 2)  CULTURE, BLOOD (ROUTINE X 2)  LACTIC ACID, PLASMA     EKG Not done  RADIOLOGY CT abdomen pelvis with IV contrast: No acute abnormality on my independent review interpretation and radiologist agrees.  Copy of report handed to patient  CT L-spine: Degenerative changes    PROCEDURES:  Critical Care performed: No  Procedures   MEDICATIONS ORDERED IN ED: Medications  ondansetron  (ZOFRAN ) injection 4 mg (4 mg Intravenous Given 10/04/24 2036)  sodium chloride  0.9 % bolus 1,000 mL (0 mLs Intravenous Stopped 10/04/24 2158)  HYDROmorphone  (DILAUDID ) injection 0.5 mg (0.5 mg Intravenous Given 10/04/24 2037)  Ampicillin -Sulbactam (UNASYN ) 3 g in sodium chloride  0.9 % 100 mL IVPB (0 g Intravenous Stopped 10/04/24 2158)  iohexol  (OMNIPAQUE ) 300 MG/ML solution 100 mL (100 mLs Intravenous Contrast Given 10/04/24 2045)  oxyCODONE -acetaminophen  (PERCOCET/ROXICET) 5-325 MG per tablet 1 tablet (1 tablet Oral Given 10/04/24 2148)     IMPRESSION / MDM / ASSESSMENT AND PLAN / ED COURSE                                Differential diagnosis includes, but is not limited to, nephrolithiasis, pyelonephritis, muscle strain, muscle spasm, radiculopathy, anemia, electrolyte derangement   ED course: Patient has no focal neurological deficits on  presentation and ambulates easily into the room.  I did consider spinal epidural abscess but has no risk factors and pain is not midline along his spine.  UTI was possible consistent with UTI given greater than 50 white blood cell counts.  He has grown out Enterococcus in the past and after discussion with pharmacy he received a dose of Unasyn .  He had no acute renal insufficiency no leukocytosis no elevated lactic acidosis.  CT abdomen pelvis was unremarkable.  Patient did receive IV fluids and a dose of Dilaudid  and Zofran  with improvement with his symptoms.  He was send the patient now  with a course of Augmentin  and Percocet.  He was encouraged to follow-up with both his primary care physician and his urologist   Clinical Course as of 10/04/24 2337  [place] Oct 04, 2024  1953 WBC, UA: >50 Large amount of white blood cells [HD]  2028 Patient has grown out enterococcus, facillius in the past which has been sensitive to ampicillin , after discussion with pharmacy will initiate Unasyn  [HD]  2059 Creatinine: 1.02 No AKI [HD]  2059 WBC: 7.5 No leukocytosis [HD]  2123 Patient reassessed, he feels improved, he feels comfortable returning home.  Will send him with 7 days of Augmentin , Percocet and Zofran .  He will follow-up with his primary care physician and urologist [HD]    Clinical Course User Index [HD] Nicholaus Rolland BRAVO, MD   At time of discharge there is no evidence of acute life, limb, vision, or fertility threat. Patient has stable vital signs, pain is well controlled, patient is ambulatory and p.o. tolerant.  Discharge instructions were completed using the EPIC system. I would refer you to those at this time. All warnings prescriptions follow-up etc. were discussed in detail with the patient. Patient indicates understanding and is agreeable with this plan. All questions answered.  Patient is made aware that they may return to the emergency department for any worsening or new condition or for any  other emergency.  -- Risk: 5 This patient has a high risk of morbidity due to further diagnostic testing or treatment. Rationale: This patient's evaluation and management involve a high risk of morbidity due to the potential severity of presenting symptoms, need for diagnostic testing, and/or initiation of treatment that may require close monitoring. The differential includes conditions with potential for significant deterioration or requiring escalation of care. Treatment decisions in the ED, including medication administration, procedural interventions, or disposition planning, reflect this level of risk. COPA: 5 The patient has the following acute or chronic illness/injury that poses a possible threat to life or bodily function: [X] : The patient has a potentially serious acute condition or an acute exacerbation of a chronic illness requiring urgent evaluation and management in the Emergency Department. The clinical presentation necessitates immediate consideration of life-threatening or function-threatening diagnoses, even if they are ultimately ruled out.   FINAL CLINICAL IMPRESSION(S) / ED DIAGNOSES   Final diagnoses:  Pyelonephritis     Rx / DC Orders   ED Discharge Orders          Ordered    ondansetron  (ZOFRAN -ODT) 4 MG disintegrating tablet  Every 8 hours PRN        10/04/24 2124    amoxicillin -clavulanate (AUGMENTIN ) 875-125 MG tablet  2 times daily        10/04/24 2124    oxyCODONE -acetaminophen  (PERCOCET) 5-325 MG tablet  Every 4 hours PRN        10/04/24 2124             Note:  This document was prepared using Dragon voice recognition software and may include unintentional dictation errors.   Nicholaus Rolland BRAVO, MD 10/04/24 [phone]

## 2024-10-06 ENCOUNTER — Ambulatory Visit: Attending: Internal Medicine

## 2024-10-06 DIAGNOSIS — R0989 Other specified symptoms and signs involving the circulatory and respiratory systems: Secondary | ICD-10-CM | POA: Diagnosis not present

## 2024-10-06 DIAGNOSIS — R200 Anesthesia of skin: Secondary | ICD-10-CM | POA: Insufficient documentation

## 2024-10-07 ENCOUNTER — Ambulatory Visit: Payer: Self-pay | Admitting: Internal Medicine

## 2024-10-07 ENCOUNTER — Ambulatory Visit: Payer: Self-pay | Admitting: Nurse Practitioner

## 2024-10-07 LAB — URINE CULTURE: Culture: >=100000 — AB

## 2024-10-07 LAB — VAS US ABI WITH/WO TBI
Left ABI: 0.87
Right ABI: 0.98

## 2024-10-09 LAB — CULTURE, BLOOD (ROUTINE X 2)
Culture: NO GROWTH
Culture: NO GROWTH
Special Requests: ADEQUATE
Special Requests: ADEQUATE

## 2024-10-16 ENCOUNTER — Encounter: Payer: Self-pay | Admitting: Nurse Practitioner

## 2024-10-16 DIAGNOSIS — M545 Low back pain, unspecified: Secondary | ICD-10-CM | POA: Insufficient documentation

## 2024-10-16 NOTE — Assessment & Plan Note (Signed)
 Intermittent sharp stabbing low back pain believed to be due to loss of core strength from weight loss and Wegovy  use. Denies any urinary symptoms. He would like to trial a muscle relaxer to see if it helps with symptoms. Start Robaxin  every 8 hours as needed. Return precautions given to patient.

## 2024-10-16 NOTE — Assessment & Plan Note (Signed)
 Managed with Lipitor and Zetia . Continue Lipitor and Zetia  for cholesterol management.

## 2024-10-16 NOTE — Assessment & Plan Note (Signed)
 Dietary management in place with focus on low glycemic index foods. Continue current dietary management.

## 2024-10-16 NOTE — Assessment & Plan Note (Signed)
 Due to testosterone  therapy with regular monitoring by hematology. No recent phlebotomies needed. Continue regular follow-up with hematology.

## 2024-10-16 NOTE — Assessment & Plan Note (Signed)
 Pancreatic lesion identified over two years ago, due for follow-up imaging. Ordered follow-up imaging to assess lesion.

## 2024-10-16 NOTE — Assessment & Plan Note (Signed)
 Significant weight loss achieved with Wegovy , currently at target weight. Consider dose reduction to 1.7 mg due to potential side effects. Continue current regimen, monitor weight, and adjust diet and exercise as necessary.

## 2024-10-16 NOTE — Assessment & Plan Note (Signed)
Continue Plavix daily 

## 2024-10-16 NOTE — Assessment & Plan Note (Signed)
 Blood pressure well-controlled with amlodipine . Continue current management.

## 2024-10-16 NOTE — Assessment & Plan Note (Signed)
 Good adherence to CPAP. Recent difficulty falling asleep. Recommend trial of melatonin two hours before bedtime. Continue nightly CPAP use.

## 2024-10-23 ENCOUNTER — Other Ambulatory Visit

## 2024-10-23 ENCOUNTER — Other Ambulatory Visit
Admission: RE | Admit: 2024-10-23 | Discharge: 2024-10-23 | Disposition: A | Payer: Self-pay | Source: Ambulatory Visit | Attending: Medical Genetics | Admitting: Medical Genetics

## 2024-10-23 DIAGNOSIS — E291 Testicular hypofunction: Secondary | ICD-10-CM | POA: Diagnosis not present

## 2024-10-24 ENCOUNTER — Ambulatory Visit: Payer: Self-pay | Admitting: Urology

## 2024-10-24 LAB — TESTOSTERONE: Testosterone: 1085 ng/dL — ABNORMAL HIGH (ref 264–916)

## 2024-11-03 ENCOUNTER — Encounter: Payer: Self-pay | Admitting: Nurse Practitioner

## 2024-11-03 LAB — GENECONNECT MOLECULAR SCREEN: Genetic Analysis Overall Interpretation: NEGATIVE

## 2024-11-03 MED ORDER — "BD LUER-LOK SYRINGE 23G X 1-1/2"" 3 ML MISC": 5 refills | Status: AC

## 2024-11-03 MED ORDER — "BD HYPODERMIC NEEDLE 18G X 1-1/2"" MISC": 0 refills | Status: AC

## 2024-11-09 ENCOUNTER — Other Ambulatory Visit: Payer: Self-pay | Admitting: Urology

## 2024-11-09 ENCOUNTER — Encounter: Payer: Self-pay | Admitting: *Deleted

## 2025-01-13 ENCOUNTER — Other Ambulatory Visit

## 2025-03-03 ENCOUNTER — Inpatient Hospital Stay

## 2025-03-03 ENCOUNTER — Inpatient Hospital Stay: Admitting: Oncology

## 2025-04-02 ENCOUNTER — Ambulatory Visit: Admitting: Nurse Practitioner

## 2025-04-14 ENCOUNTER — Other Ambulatory Visit

## 2025-04-16 ENCOUNTER — Ambulatory Visit: Admitting: Urology
